# Patient Record
Sex: Male | Born: 1960 | Race: White | Hispanic: No | Marital: Married | Smoking: Former smoker
Health system: Southern US, Community
[De-identification: ages and names within clinical notes are randomized; demographics above are authoritative.]

## PROBLEM LIST (undated history)

## (undated) DIAGNOSIS — Z923 Personal history of irradiation: Secondary | ICD-10-CM

## (undated) DIAGNOSIS — C8590 Non-Hodgkin lymphoma, unspecified, unspecified site: Secondary | ICD-10-CM

## (undated) DIAGNOSIS — I10 Essential (primary) hypertension: Secondary | ICD-10-CM

## (undated) HISTORY — PX: BONE MARROW ASPIRATION: SHX1252

## (undated) HISTORY — DX: Non-Hodgkin lymphoma, unspecified, unspecified site: C85.90

## (undated) HISTORY — DX: Personal history of irradiation: Z92.3

## (undated) HISTORY — PX: TONSILLECTOMY: SUR1361

## (undated) HISTORY — PX: BIOPSY PHARYNX: SUR141

## (undated) HISTORY — DX: Essential (primary) hypertension: I10

---

## 2003-09-13 ENCOUNTER — Ambulatory Visit (HOSPITAL_COMMUNITY): Admission: RE | Admit: 2003-09-13 | Discharge: 2003-09-13 | Payer: Self-pay | Admitting: *Deleted

## 2003-09-13 ENCOUNTER — Encounter (INDEPENDENT_AMBULATORY_CARE_PROVIDER_SITE_OTHER): Payer: Self-pay | Admitting: *Deleted

## 2003-09-13 ENCOUNTER — Encounter (INDEPENDENT_AMBULATORY_CARE_PROVIDER_SITE_OTHER): Payer: Self-pay | Admitting: Specialist

## 2003-09-13 ENCOUNTER — Ambulatory Visit (HOSPITAL_BASED_OUTPATIENT_CLINIC_OR_DEPARTMENT_OTHER): Admission: RE | Admit: 2003-09-13 | Discharge: 2003-09-13 | Payer: Self-pay | Admitting: *Deleted

## 2003-09-20 ENCOUNTER — Encounter (INDEPENDENT_AMBULATORY_CARE_PROVIDER_SITE_OTHER): Payer: Self-pay | Admitting: Specialist

## 2003-09-20 ENCOUNTER — Ambulatory Visit (HOSPITAL_BASED_OUTPATIENT_CLINIC_OR_DEPARTMENT_OTHER): Admission: RE | Admit: 2003-09-20 | Discharge: 2003-09-20 | Payer: Self-pay | Admitting: *Deleted

## 2003-09-20 ENCOUNTER — Encounter (INDEPENDENT_AMBULATORY_CARE_PROVIDER_SITE_OTHER): Payer: Self-pay | Admitting: *Deleted

## 2003-09-20 ENCOUNTER — Ambulatory Visit (HOSPITAL_COMMUNITY): Admission: RE | Admit: 2003-09-20 | Discharge: 2003-09-20 | Payer: Self-pay | Admitting: *Deleted

## 2003-09-29 ENCOUNTER — Ambulatory Visit (HOSPITAL_COMMUNITY): Admission: RE | Admit: 2003-09-29 | Discharge: 2003-09-29 | Payer: Self-pay | Admitting: *Deleted

## 2003-10-12 ENCOUNTER — Ambulatory Visit (HOSPITAL_COMMUNITY): Admission: RE | Admit: 2003-10-12 | Discharge: 2003-10-12 | Payer: Self-pay | Admitting: Oncology

## 2003-10-12 ENCOUNTER — Encounter (INDEPENDENT_AMBULATORY_CARE_PROVIDER_SITE_OTHER): Payer: Self-pay | Admitting: Cardiology

## 2003-10-13 ENCOUNTER — Ambulatory Visit (HOSPITAL_COMMUNITY): Admission: RE | Admit: 2003-10-13 | Discharge: 2003-10-13 | Payer: Self-pay | Admitting: Oncology

## 2003-10-13 ENCOUNTER — Other Ambulatory Visit: Admission: RE | Admit: 2003-10-13 | Discharge: 2003-10-13 | Payer: Self-pay | Admitting: Oncology

## 2003-10-13 ENCOUNTER — Encounter (INDEPENDENT_AMBULATORY_CARE_PROVIDER_SITE_OTHER): Payer: Self-pay | Admitting: *Deleted

## 2003-10-25 ENCOUNTER — Ambulatory Visit: Admission: RE | Admit: 2003-10-25 | Discharge: 2003-12-01 | Payer: Self-pay | Admitting: Radiation Oncology

## 2003-11-24 ENCOUNTER — Ambulatory Visit: Payer: Self-pay | Admitting: Oncology

## 2004-01-10 ENCOUNTER — Ambulatory Visit: Payer: Self-pay | Admitting: Oncology

## 2004-02-25 ENCOUNTER — Ambulatory Visit: Payer: Self-pay | Admitting: Oncology

## 2004-03-31 ENCOUNTER — Encounter: Admission: RE | Admit: 2004-03-31 | Discharge: 2004-03-31 | Payer: Self-pay | Admitting: Oncology

## 2004-05-31 ENCOUNTER — Ambulatory Visit: Payer: Self-pay | Admitting: Oncology

## 2004-07-20 ENCOUNTER — Ambulatory Visit: Payer: Self-pay | Admitting: Oncology

## 2004-09-04 ENCOUNTER — Ambulatory Visit: Payer: Self-pay | Admitting: Oncology

## 2004-10-20 ENCOUNTER — Ambulatory Visit: Payer: Self-pay | Admitting: Oncology

## 2004-12-12 ENCOUNTER — Ambulatory Visit: Payer: Self-pay | Admitting: Oncology

## 2004-12-27 ENCOUNTER — Encounter: Admission: RE | Admit: 2004-12-27 | Discharge: 2004-12-27 | Payer: Self-pay | Admitting: Oncology

## 2005-02-09 ENCOUNTER — Ambulatory Visit: Payer: Self-pay | Admitting: Oncology

## 2005-05-14 ENCOUNTER — Ambulatory Visit: Payer: Self-pay | Admitting: Oncology

## 2005-05-14 LAB — CBC WITH DIFFERENTIAL/PLATELET
BASO%: 1 % (ref 0.0–2.0)
Basophils Absolute: 0 10*3/uL (ref 0.0–0.1)
EOS%: 1.7 % (ref 0.0–7.0)
Eosinophils Absolute: 0.1 10*3/uL (ref 0.0–0.5)
HCT: 44.5 % (ref 38.7–49.9)
HGB: 16.1 g/dL (ref 13.0–17.1)
LYMPH%: 30.7 % (ref 14.0–48.0)
MCH: 31.2 pg (ref 28.0–33.4)
MCHC: 36.2 g/dL — ABNORMAL HIGH (ref 32.0–35.9)
MCV: 86.1 fL (ref 81.6–98.0)
MONO#: 0.5 10*3/uL (ref 0.1–0.9)
MONO%: 12.1 % (ref 0.0–13.0)
NEUT#: 2.4 10*3/uL (ref 1.5–6.5)
NEUT%: 54.5 % (ref 40.0–75.0)
Platelets: 211 10*3/uL (ref 145–400)
RBC: 5.16 10*6/uL (ref 4.20–5.71)
RDW: 11.3 % (ref 11.2–14.6)
WBC: 4.4 10*3/uL (ref 4.0–10.0)
lymph#: 1.4 10*3/uL (ref 0.9–3.3)

## 2005-09-28 ENCOUNTER — Ambulatory Visit: Payer: Self-pay | Admitting: Oncology

## 2005-10-01 ENCOUNTER — Encounter: Admission: RE | Admit: 2005-10-01 | Discharge: 2005-10-01 | Payer: Self-pay | Admitting: Oncology

## 2005-10-01 LAB — CBC WITH DIFFERENTIAL/PLATELET
BASO%: 1.3 % (ref 0.0–2.0)
Basophils Absolute: 0.1 10*3/uL (ref 0.0–0.1)
EOS%: 1 % (ref 0.0–7.0)
Eosinophils Absolute: 0.1 10*3/uL (ref 0.0–0.5)
HCT: 44.3 % (ref 38.7–49.9)
HGB: 16.1 g/dL (ref 13.0–17.1)
LYMPH%: 26.2 % (ref 14.0–48.0)
MCH: 30.7 pg (ref 28.0–33.4)
MCHC: 36.4 g/dL — ABNORMAL HIGH (ref 32.0–35.9)
MCV: 84.4 fL (ref 81.6–98.0)
MONO#: 0.5 10*3/uL (ref 0.1–0.9)
MONO%: 9.9 % (ref 0.0–13.0)
NEUT#: 3.4 10*3/uL (ref 1.5–6.5)
NEUT%: 61.6 % (ref 40.0–75.0)
Platelets: 194 10*3/uL (ref 145–400)
RBC: 5.25 10*6/uL (ref 4.20–5.71)
RDW: 11 % — ABNORMAL LOW (ref 11.2–14.6)
WBC: 5.5 10*3/uL (ref 4.0–10.0)
lymph#: 1.4 10*3/uL (ref 0.9–3.3)

## 2005-10-05 LAB — COMPREHENSIVE METABOLIC PANEL WITH GFR
ALT: 30 U/L (ref 0–40)
AST: 18 U/L (ref 0–37)
Albumin: 5.1 g/dL (ref 3.5–5.2)
Alkaline Phosphatase: 24 U/L — ABNORMAL LOW (ref 39–117)
BUN: 18 mg/dL (ref 6–23)
CO2: 27 meq/L (ref 19–32)
Calcium: 10 mg/dL (ref 8.4–10.5)
Chloride: 104 meq/L (ref 96–112)
Creatinine, Ser: 0.98 mg/dL (ref 0.40–1.50)
Glucose, Bld: 88 mg/dL (ref 70–99)
Potassium: 4.7 meq/L (ref 3.5–5.3)
Sodium: 139 meq/L (ref 135–145)
Total Bilirubin: 1.2 mg/dL (ref 0.3–1.2)
Total Protein: 7.6 g/dL (ref 6.0–8.3)

## 2005-10-05 LAB — LACTATE DEHYDROGENASE: LDH: 132 U/L (ref 94–250)

## 2006-01-16 ENCOUNTER — Ambulatory Visit: Payer: Self-pay | Admitting: Oncology

## 2006-04-02 ENCOUNTER — Ambulatory Visit: Payer: Self-pay | Admitting: Oncology

## 2006-04-05 LAB — CBC WITH DIFFERENTIAL/PLATELET
BASO%: 0.4 % (ref 0.0–2.0)
Basophils Absolute: 0 10*3/uL (ref 0.0–0.1)
EOS%: 1.2 % (ref 0.0–7.0)
Eosinophils Absolute: 0.1 10*3/uL (ref 0.0–0.5)
HCT: 46.4 % (ref 38.7–49.9)
HGB: 16.5 g/dL (ref 13.0–17.1)
LYMPH%: 25.8 % (ref 14.0–48.0)
MCH: 30.9 pg (ref 28.0–33.4)
MCHC: 35.7 g/dL (ref 32.0–35.9)
MCV: 86.6 fL (ref 81.6–98.0)
MONO#: 0.4 10*3/uL (ref 0.1–0.9)
MONO%: 7.8 % (ref 0.0–13.0)
NEUT#: 3.4 10*3/uL (ref 1.5–6.5)
NEUT%: 64.8 % (ref 40.0–75.0)
Platelets: 256 10*3/uL (ref 145–400)
RBC: 5.35 10*6/uL (ref 4.20–5.71)
RDW: 13 % (ref 11.2–14.6)
WBC: 5.3 10*3/uL (ref 4.0–10.0)
lymph#: 1.4 10*3/uL (ref 0.9–3.3)

## 2006-10-01 ENCOUNTER — Ambulatory Visit: Payer: Self-pay | Admitting: Oncology

## 2006-10-03 LAB — CBC WITH DIFFERENTIAL/PLATELET
Basophils Absolute: 0.1 10*3/uL (ref 0.0–0.1)
Eosinophils Absolute: 0 10*3/uL (ref 0.0–0.5)
HCT: 43.6 % (ref 38.7–49.9)
HGB: 15.4 g/dL (ref 13.0–17.1)
MCH: 30 pg (ref 28.0–33.4)
NEUT#: 2.8 10*3/uL (ref 1.5–6.5)
NEUT%: 58 % (ref 40.0–75.0)
RDW: 10.5 % — ABNORMAL LOW (ref 11.2–14.6)
lymph#: 1.4 10*3/uL (ref 0.9–3.3)

## 2007-04-01 ENCOUNTER — Ambulatory Visit: Payer: Self-pay | Admitting: Oncology

## 2007-04-03 LAB — CBC WITH DIFFERENTIAL/PLATELET
BASO%: 1 % (ref 0.0–2.0)
Basophils Absolute: 0.1 10*3/uL (ref 0.0–0.1)
EOS%: 0.6 % (ref 0.0–7.0)
Eosinophils Absolute: 0 10*3/uL (ref 0.0–0.5)
HCT: 43.8 % (ref 38.7–49.9)
HGB: 15.4 g/dL (ref 13.0–17.1)
LYMPH%: 31.5 % (ref 14.0–48.0)
MCH: 30.1 pg (ref 28.0–33.4)
MCHC: 35.1 g/dL (ref 32.0–35.9)
MCV: 85.8 fL (ref 81.6–98.0)
MONO#: 0.5 10*3/uL (ref 0.1–0.9)
MONO%: 9.2 % (ref 0.0–13.0)
NEUT#: 3.4 10*3/uL (ref 1.5–6.5)
NEUT%: 57.7 % (ref 40.0–75.0)
Platelets: 178 10*3/uL (ref 145–400)
RBC: 5.11 10*6/uL (ref 4.20–5.71)
RDW: 11.7 % (ref 11.2–14.6)
WBC: 5.8 10*3/uL (ref 4.0–10.0)
lymph#: 1.8 10*3/uL (ref 0.9–3.3)

## 2008-02-12 ENCOUNTER — Ambulatory Visit: Payer: Self-pay | Admitting: Oncology

## 2009-02-22 ENCOUNTER — Ambulatory Visit: Payer: Self-pay | Admitting: Oncology

## 2009-02-24 LAB — CBC WITH DIFFERENTIAL/PLATELET
BASO%: 0.8 % (ref 0.0–2.0)
Basophils Absolute: 0 10*3/uL (ref 0.0–0.1)
EOS%: 1.6 % (ref 0.0–7.0)
Eosinophils Absolute: 0.1 10*3/uL (ref 0.0–0.5)
HCT: 46.9 % (ref 38.4–49.9)
HGB: 16.5 g/dL (ref 13.0–17.1)
LYMPH%: 30.6 % (ref 14.0–49.0)
MCH: 30.7 pg (ref 27.2–33.4)
MCHC: 35.2 g/dL (ref 32.0–36.0)
MCV: 87.2 fL (ref 79.3–98.0)
MONO#: 0.5 10*3/uL (ref 0.1–0.9)
MONO%: 10.4 % (ref 0.0–14.0)
NEUT#: 2.8 10*3/uL (ref 1.5–6.5)
NEUT%: 56.6 % (ref 39.0–75.0)
Platelets: 173 10*3/uL (ref 140–400)
RBC: 5.38 10*6/uL (ref 4.20–5.82)
RDW: 12.8 % (ref 11.0–14.6)
WBC: 5 10*3/uL (ref 4.0–10.3)
lymph#: 1.5 10*3/uL (ref 0.9–3.3)

## 2010-06-09 NOTE — Op Note (Signed)
[name], [name]                               ACCOUNT NO.:  [id number]   MEDICAL RECORD NO.:  [id number]                   PATIENT TYPE:  AMB   LOCATION:  DSC                                  FACILITY:  [facility]   PHYSICIAN:  Kathy Breach, M.D.                   DATE OF BIRTH:  [birth year]   DATE OF PROCEDURE:  09/20/2003  DATE OF DISCHARGE:                                 OPERATIVE REPORT   PREOPERATIVE DIAGNOSIS:  Necrosing lesion, left tonsil, rule out lymphoma,  suggested but not totally confirmed on previous biopsy.   POSTOPERATIVE DIAGNOSIS:  Pending histological report.   OPERATION PERFORMED:  Tonsillectomy.   SURGEON:  Kathy Breach, M.D.   ANESTHESIA:  General orotracheal.   DESCRIPTION OF PROCEDURE:  With the patient under general orotracheal  anesthesia, a Crowe-Davis mouth gag was inserted and the patient put in [place]  position.  Inspection of the oral cavity revealed a necrotic erosive lesion  involving the left tonsil tissue which was enlarged but appeared to have  sharp delineated margins.  Tonsil was nonpulsatile to palpation.  The right  tonsil was 1+ enlarged and not smooth, shiny, healthy-appearing tonsil.  There was an additional Michaelfurt of lymphoid tissue on the posterior  pharyngeal wall to the right of the midline at the level of the right tonsil  with a yellow necrotic 3 or 4 mm central portion.  The left tonsil expected  to be very friable, so handled gently without initial use of tonsillar  tenaculum for retraction. With cutting current, mucosal margins were incised  on the anterior tonsillar pillar and around the superior pole.  With gradual  dissection bluntly at the not very well defined junction between tonsillar  tissue and the constrictor and pharyngeal muscles, slowly and gradually  maintained complete hemostasis with electrocautery, the left tonsil was  dissected and removed.  The right tonsil then grasped at the superior pole  and removed by electrical  dissection, maintaining complete hemostasis with  electrocautery.  The prominent centrally necrosing island of lymphoid tissue  on the posterior pharyngeal wall likewise was excised with electrocautery.  The estimated blood loss for this procedure was less than 15 mL.  The  patient tolerated the procedure well and was taken to recovery room in  stable general condition.  Histologic specimens were submitted fresh in  separate containers for identification and further evaluation.                                               Kathy Breach, M.D.    Venia Minks  D:  09/20/2003  T:  09/20/2003  Job:  [id number]

## 2010-08-09 ENCOUNTER — Other Ambulatory Visit: Payer: Self-pay | Admitting: Internal Medicine

## 2010-08-10 ENCOUNTER — Ambulatory Visit
Admission: RE | Admit: 2010-08-10 | Discharge: 2010-08-10 | Disposition: A | Discharge status: Home / Self Care | Payer: 59 | Source: Ambulatory Visit | Attending: Internal Medicine | Admitting: Internal Medicine

## 2010-08-10 MED ORDER — IOHEXOL 300 MG/ML  SOLN
75.0000 mL | Freq: Once | INTRAMUSCULAR | Status: AC | PRN
  Administered 2010-08-10: 75 mL via INTRAVENOUS

## 2010-08-17 ENCOUNTER — Other Ambulatory Visit: Payer: Self-pay | Admitting: Oncology

## 2010-08-17 DIAGNOSIS — C8590 Non-Hodgkin lymphoma, unspecified, unspecified site: Secondary | ICD-10-CM

## 2010-08-22 ENCOUNTER — Encounter (HOSPITAL_BASED_OUTPATIENT_CLINIC_OR_DEPARTMENT_OTHER): Payer: 59 | Admitting: Oncology

## 2010-08-22 DIAGNOSIS — C8589 Other specified types of non-Hodgkin lymphoma, extranodal and solid organ sites: Secondary | ICD-10-CM

## 2010-08-23 ENCOUNTER — Encounter (HOSPITAL_COMMUNITY)
Admission: RE | Admit: 2010-08-23 | Discharge: 2010-08-23 | Disposition: A | Discharge status: Home / Self Care | Payer: 59 | Source: Ambulatory Visit | Attending: Oncology | Admitting: Oncology

## 2010-08-23 DIAGNOSIS — C8590 Non-Hodgkin lymphoma, unspecified, unspecified site: Secondary | ICD-10-CM

## 2010-08-23 DIAGNOSIS — C8589 Other specified types of non-Hodgkin lymphoma, extranodal and solid organ sites: Secondary | ICD-10-CM | POA: Insufficient documentation

## 2010-08-23 LAB — GLUCOSE, CAPILLARY: Glucose-Capillary: 94 mg/dL (ref 70–99)

## 2010-08-23 MED ORDER — FLUDEOXYGLUCOSE F - 18 (FDG) INJECTION
18.6000 | Freq: Once | INTRAVENOUS | Status: AC | PRN
  Administered 2010-08-23: 18.6 via INTRAVENOUS

## 2010-09-06 ENCOUNTER — Encounter (HOSPITAL_BASED_OUTPATIENT_CLINIC_OR_DEPARTMENT_OTHER): Payer: 59 | Admitting: Oncology

## 2010-09-06 DIAGNOSIS — C8589 Other specified types of non-Hodgkin lymphoma, extranodal and solid organ sites: Secondary | ICD-10-CM

## 2010-09-08 ENCOUNTER — Other Ambulatory Visit (HOSPITAL_COMMUNITY)
Admission: RE | Admit: 2010-09-08 | Discharge: 2010-09-08 | Disposition: A | Discharge status: Home / Self Care | Payer: 59 | Source: Ambulatory Visit | Attending: Oncology | Admitting: Oncology

## 2010-09-08 ENCOUNTER — Other Ambulatory Visit: Payer: Self-pay | Admitting: Oncology

## 2010-09-08 ENCOUNTER — Encounter (HOSPITAL_BASED_OUTPATIENT_CLINIC_OR_DEPARTMENT_OTHER): Payer: 59 | Admitting: Oncology

## 2010-09-08 DIAGNOSIS — C8589 Other specified types of non-Hodgkin lymphoma, extranodal and solid organ sites: Secondary | ICD-10-CM | POA: Insufficient documentation

## 2010-09-08 DIAGNOSIS — D469 Myelodysplastic syndrome, unspecified: Secondary | ICD-10-CM | POA: Insufficient documentation

## 2010-09-12 ENCOUNTER — Ambulatory Visit
Admission: RE | Admit: 2010-09-12 | Discharge: 2010-09-12 | Disposition: A | Discharge status: Home / Self Care | Payer: Managed Care, Other (non HMO) | Source: Ambulatory Visit | Attending: Radiation Oncology | Admitting: Radiation Oncology

## 2010-09-12 DIAGNOSIS — C8581 Other specified types of non-Hodgkin lymphoma, lymph nodes of head, face, and neck: Secondary | ICD-10-CM | POA: Insufficient documentation

## 2010-09-18 ENCOUNTER — Encounter (HOSPITAL_BASED_OUTPATIENT_CLINIC_OR_DEPARTMENT_OTHER): Payer: 59 | Admitting: Oncology

## 2010-09-18 ENCOUNTER — Other Ambulatory Visit (HOSPITAL_COMMUNITY): Payer: 59 | Admitting: Dentistry

## 2010-09-18 DIAGNOSIS — K0530 Chronic periodontitis, unspecified: Secondary | ICD-10-CM

## 2010-09-18 DIAGNOSIS — K036 Deposits [accretions] on teeth: Secondary | ICD-10-CM

## 2010-09-18 DIAGNOSIS — M264 Malocclusion, unspecified: Secondary | ICD-10-CM

## 2010-09-19 ENCOUNTER — Inpatient Hospital Stay (HOSPITAL_COMMUNITY): Payer: Managed Care, Other (non HMO)

## 2010-09-19 ENCOUNTER — Inpatient Hospital Stay (HOSPITAL_COMMUNITY)
Admission: AD | Admit: 2010-09-19 | Discharge: 2010-09-21 | DRG: 847 | Disposition: A | Discharge status: Home / Self Care | Payer: Managed Care, Other (non HMO) | Source: Ambulatory Visit | Attending: Oncology | Admitting: Oncology

## 2010-09-19 DIAGNOSIS — C8589 Other specified types of non-Hodgkin lymphoma, extranodal and solid organ sites: Secondary | ICD-10-CM | POA: Diagnosis present

## 2010-09-19 DIAGNOSIS — Z5111 Encounter for antineoplastic chemotherapy: Principal | ICD-10-CM

## 2010-09-19 DIAGNOSIS — I10 Essential (primary) hypertension: Secondary | ICD-10-CM | POA: Diagnosis present

## 2010-09-19 DIAGNOSIS — L539 Erythematous condition, unspecified: Secondary | ICD-10-CM | POA: Diagnosis not present

## 2010-09-19 HISTORY — PX: PORTACATH PLACEMENT: SHX2246

## 2010-09-19 LAB — COMPREHENSIVE METABOLIC PANEL
Alkaline Phosphatase: 28 U/L — ABNORMAL LOW (ref 39–117)
BUN: 16 mg/dL (ref 6–23)
Creatinine, Ser: 0.82 mg/dL (ref 0.50–1.35)
GFR calc Af Amer: >60 mL/min (ref >60)
Glucose, Bld: 98 mg/dL (ref 70–99)
Potassium: 3.9 mEq/L (ref 3.5–5.1)
Total Protein: 6.9 g/dL (ref 6.0–8.3)

## 2010-09-19 LAB — DIFFERENTIAL
Basophils Absolute: 0 10*3/uL (ref 0.0–0.1)
Eosinophils Relative: 1 % (ref 0–5)
Lymphocytes Relative: 22 % (ref 12–46)
Lymphs Abs: 1.1 10*3/uL (ref 0.7–4.0)
Monocytes Absolute: 0.6 10*3/uL (ref 0.1–1.0)
Monocytes Relative: 12 % (ref 3–12)
Neutro Abs: 3.4 10*3/uL (ref 1.7–7.7)

## 2010-09-19 LAB — CBC
HCT: 40.7 % (ref 39.0–52.0)
Hemoglobin: 14.6 g/dL (ref 13.0–17.0)
MCH: 30.4 pg (ref 26.0–34.0)
MCHC: 35.9 g/dL (ref 30.0–36.0)
MCV: 84.6 fL (ref 78.0–100.0)
RDW: 12.6 % (ref 11.5–15.5)

## 2010-09-19 LAB — LACTATE DEHYDROGENASE: LDH: 179 U/L (ref 94–250)

## 2010-09-19 LAB — HEPATITIS B SURFACE ANTIGEN: Hepatitis B Surface Ag: NEGATIVE

## 2010-09-19 LAB — URIC ACID: Uric Acid, Serum: 7.4 mg/dL (ref 4.0–7.8)

## 2010-09-20 LAB — URINALYSIS, ROUTINE W REFLEX MICROSCOPIC
Bilirubin Urine: NEGATIVE
Leukocytes, UA: NEGATIVE
Nitrite: NEGATIVE
Specific Gravity, Urine: 1.013 (ref 1.005–1.030)
Urobilinogen, UA: 0.2 mg/dL (ref 0.0–1.0)
pH: 5.5 (ref 5.0–8.0)

## 2010-09-20 LAB — URINALYSIS, DIPSTICK ONLY
Leukocytes, UA: NEGATIVE
Nitrite: NEGATIVE
Specific Gravity, Urine: 1.012 (ref 1.005–1.030)
Urobilinogen, UA: 0.2 mg/dL (ref 0.0–1.0)
pH: 5.5 (ref 5.0–8.0)

## 2010-09-20 LAB — URINE MICROSCOPIC-ADD ON: Urine-Other: NONE SEEN

## 2010-09-21 LAB — URINALYSIS, DIPSTICK ONLY
Glucose, UA: NEGATIVE mg/dL
Leukocytes, UA: NEGATIVE
Nitrite: NEGATIVE
pH: 6 (ref 5.0–8.0)

## 2010-09-22 ENCOUNTER — Encounter (HOSPITAL_BASED_OUTPATIENT_CLINIC_OR_DEPARTMENT_OTHER): Payer: Managed Care, Other (non HMO) | Admitting: Oncology

## 2010-09-22 DIAGNOSIS — C8589 Other specified types of non-Hodgkin lymphoma, extranodal and solid organ sites: Secondary | ICD-10-CM

## 2010-09-28 ENCOUNTER — Encounter (HOSPITAL_BASED_OUTPATIENT_CLINIC_OR_DEPARTMENT_OTHER): Payer: Managed Care, Other (non HMO) | Admitting: Oncology

## 2010-09-28 ENCOUNTER — Other Ambulatory Visit: Payer: Self-pay | Admitting: Oncology

## 2010-09-28 DIAGNOSIS — C8589 Other specified types of non-Hodgkin lymphoma, extranodal and solid organ sites: Secondary | ICD-10-CM

## 2010-09-28 LAB — CBC WITH DIFFERENTIAL/PLATELET
BASO%: 2.8 % — ABNORMAL HIGH (ref 0.0–2.0)
Basophils Absolute: 0 10*3/uL (ref 0.0–0.1)
EOS%: 2.8 % (ref 0.0–7.0)
HCT: 34.7 % — ABNORMAL LOW (ref 38.4–49.9)
HGB: 12.5 g/dL — ABNORMAL LOW (ref 13.0–17.1)
MCHC: 36 g/dL (ref 32.0–36.0)
MONO#: 0.5 10*3/uL (ref 0.1–0.9)
NEUT%: 4.8 % — ABNORMAL LOW (ref 39.0–75.0)
RDW: 12 % (ref 11.0–14.6)
WBC: 1.4 10*3/uL — ABNORMAL LOW (ref 4.0–10.3)
lymph#: 0.8 10*3/uL — ABNORMAL LOW (ref 0.9–3.3)

## 2010-09-29 ENCOUNTER — Other Ambulatory Visit: Payer: Self-pay | Admitting: Oncology

## 2010-09-29 ENCOUNTER — Encounter (HOSPITAL_BASED_OUTPATIENT_CLINIC_OR_DEPARTMENT_OTHER): Payer: Managed Care, Other (non HMO) | Admitting: Oncology

## 2010-09-29 DIAGNOSIS — C8589 Other specified types of non-Hodgkin lymphoma, extranodal and solid organ sites: Secondary | ICD-10-CM

## 2010-09-29 LAB — CBC WITH DIFFERENTIAL/PLATELET
Basophils Absolute: 0.2 10*3/uL — ABNORMAL HIGH (ref 0.0–0.1)
Eosinophils Absolute: 0.1 10*3/uL (ref 0.0–0.5)
HGB: 12.5 g/dL — ABNORMAL LOW (ref 13.0–17.1)
MCV: 82.5 fL (ref 79.3–98.0)
MONO#: 2.4 10*3/uL — ABNORMAL HIGH (ref 0.1–0.9)
MONO%: 36.3 % — ABNORMAL HIGH (ref 0.0–14.0)
NEUT#: 2.6 10*3/uL (ref 1.5–6.5)
RBC: 4.17 10*6/uL — ABNORMAL LOW (ref 4.20–5.82)
RDW: 12 % (ref 11.0–14.6)
WBC: 6.7 10*3/uL (ref 4.0–10.3)

## 2010-10-02 ENCOUNTER — Encounter (HOSPITAL_BASED_OUTPATIENT_CLINIC_OR_DEPARTMENT_OTHER): Payer: Managed Care, Other (non HMO) | Admitting: Oncology

## 2010-10-02 ENCOUNTER — Other Ambulatory Visit: Payer: Self-pay | Admitting: Oncology

## 2010-10-02 DIAGNOSIS — C8589 Other specified types of non-Hodgkin lymphoma, extranodal and solid organ sites: Secondary | ICD-10-CM

## 2010-10-02 LAB — CBC WITH DIFFERENTIAL/PLATELET
Basophils Absolute: 0.2 10*3/uL — ABNORMAL HIGH (ref 0.0–0.1)
Eosinophils Absolute: 0 10*3/uL (ref 0.0–0.5)
HCT: 34.3 % — ABNORMAL LOW (ref 38.4–49.9)
HGB: 12.1 g/dL — ABNORMAL LOW (ref 13.0–17.1)
LYMPH%: 11.6 % — ABNORMAL LOW (ref 14.0–49.0)
MCV: 83.9 fL (ref 79.3–98.0)
MONO#: 0.4 10*3/uL (ref 0.1–0.9)
MONO%: 2.4 % (ref 0.0–14.0)
NEUT#: 15.8 10*3/uL — ABNORMAL HIGH (ref 1.5–6.5)
NEUT%: 84.9 % — ABNORMAL HIGH (ref 39.0–75.0)
Platelets: 35 10*3/uL — ABNORMAL LOW (ref 140–400)
WBC: 18.6 10*3/uL — ABNORMAL HIGH (ref 4.0–10.3)
nRBC: 0 % (ref 0–0)

## 2010-10-04 ENCOUNTER — Encounter (HOSPITAL_BASED_OUTPATIENT_CLINIC_OR_DEPARTMENT_OTHER): Payer: Managed Care, Other (non HMO) | Admitting: Oncology

## 2010-10-04 ENCOUNTER — Other Ambulatory Visit: Payer: Self-pay | Admitting: Oncology

## 2010-10-04 DIAGNOSIS — C8589 Other specified types of non-Hodgkin lymphoma, extranodal and solid organ sites: Secondary | ICD-10-CM

## 2010-10-04 LAB — CBC WITH DIFFERENTIAL/PLATELET
Basophils Absolute: 0 10*3/uL (ref 0.0–0.1)
EOS%: 0.2 % (ref 0.0–7.0)
HCT: 36.1 % — ABNORMAL LOW (ref 38.4–49.9)
HGB: 12.9 g/dL — ABNORMAL LOW (ref 13.0–17.1)
LYMPH%: 6.6 % — ABNORMAL LOW (ref 14.0–49.0)
MCH: 30.9 pg (ref 27.2–33.4)
MCHC: 35.8 g/dL (ref 32.0–36.0)
NEUT%: 87.7 % — ABNORMAL HIGH (ref 39.0–75.0)
Platelets: 60 10*3/uL — ABNORMAL LOW (ref 140–400)
lymph#: 0.9 10*3/uL (ref 0.9–3.3)

## 2010-10-09 ENCOUNTER — Encounter (HOSPITAL_BASED_OUTPATIENT_CLINIC_OR_DEPARTMENT_OTHER): Payer: Managed Care, Other (non HMO) | Admitting: Oncology

## 2010-10-09 ENCOUNTER — Other Ambulatory Visit: Payer: Self-pay | Admitting: Oncology

## 2010-10-09 DIAGNOSIS — C8589 Other specified types of non-Hodgkin lymphoma, extranodal and solid organ sites: Secondary | ICD-10-CM

## 2010-10-09 LAB — CBC WITH DIFFERENTIAL/PLATELET
BASO%: 0.5 % (ref 0.0–2.0)
Basophils Absolute: 0 10*3/uL (ref 0.0–0.1)
EOS%: 0.6 % (ref 0.0–7.0)
HCT: 32.8 % — ABNORMAL LOW (ref 38.4–49.9)
HGB: 11.9 g/dL — ABNORMAL LOW (ref 13.0–17.1)
MCH: 29.9 pg (ref 27.2–33.4)
MCHC: 36.3 g/dL — ABNORMAL HIGH (ref 32.0–36.0)
MCV: 82.4 fL (ref 79.3–98.0)
MONO%: 8.8 % (ref 0.0–14.0)
NEUT%: 78 % — ABNORMAL HIGH (ref 39.0–75.0)
RDW: 12.3 % (ref 11.0–14.6)

## 2010-10-10 ENCOUNTER — Inpatient Hospital Stay (HOSPITAL_COMMUNITY)
Admission: RE | Admit: 2010-10-10 | Discharge: 2010-10-12 | DRG: 847 | Disposition: A | Discharge status: Home / Self Care | Payer: Managed Care, Other (non HMO) | Source: Ambulatory Visit | Attending: Oncology | Admitting: Oncology

## 2010-10-10 DIAGNOSIS — C8589 Other specified types of non-Hodgkin lymphoma, extranodal and solid organ sites: Secondary | ICD-10-CM

## 2010-10-10 DIAGNOSIS — I10 Essential (primary) hypertension: Secondary | ICD-10-CM | POA: Diagnosis present

## 2010-10-10 DIAGNOSIS — Z5111 Encounter for antineoplastic chemotherapy: Secondary | ICD-10-CM

## 2010-10-10 LAB — COMPREHENSIVE METABOLIC PANEL
Albumin: 3.8 g/dL (ref 3.5–5.2)
BUN: 12 mg/dL (ref 6–23)
Creatinine, Ser: 0.71 mg/dL (ref 0.50–1.35)
Total Protein: 6.5 g/dL (ref 6.0–8.3)

## 2010-10-10 LAB — URINALYSIS, DIPSTICK ONLY
Bilirubin Urine: NEGATIVE
Bilirubin Urine: NEGATIVE
Glucose, UA: NEGATIVE mg/dL
Glucose, UA: NEGATIVE mg/dL
Hgb urine dipstick: NEGATIVE
Hgb urine dipstick: NEGATIVE
Ketones, ur: >80 mg/dL — AB
Leukocytes, UA: NEGATIVE
Specific Gravity, Urine: 1.017 (ref 1.005–1.030)
pH: 5.5 (ref 5.0–8.0)
pH: 5 (ref 5.0–8.0)

## 2010-10-11 LAB — URINALYSIS, DIPSTICK ONLY
Bilirubin Urine: NEGATIVE
Bilirubin Urine: NEGATIVE
Glucose, UA: 250 mg/dL — AB
Glucose, UA: NEGATIVE mg/dL
Hgb urine dipstick: NEGATIVE
Hgb urine dipstick: NEGATIVE
Ketones, ur: >80 mg/dL — AB
Ketones, ur: 15 mg/dL — AB
Nitrite: NEGATIVE
Protein, ur: NEGATIVE mg/dL
Specific Gravity, Urine: 1.011 (ref 1.005–1.030)
Urobilinogen, UA: 0.2 mg/dL (ref 0.0–1.0)
pH: 5.5 (ref 5.0–8.0)

## 2010-10-12 LAB — URINALYSIS, DIPSTICK ONLY
Bilirubin Urine: NEGATIVE
Glucose, UA: NEGATIVE mg/dL
Hgb urine dipstick: NEGATIVE
Ketones, ur: NEGATIVE mg/dL
Protein, ur: NEGATIVE mg/dL

## 2010-10-13 ENCOUNTER — Encounter (HOSPITAL_BASED_OUTPATIENT_CLINIC_OR_DEPARTMENT_OTHER): Payer: Managed Care, Other (non HMO) | Admitting: Oncology

## 2010-10-13 DIAGNOSIS — C8589 Other specified types of non-Hodgkin lymphoma, extranodal and solid organ sites: Secondary | ICD-10-CM

## 2010-10-19 ENCOUNTER — Encounter (HOSPITAL_BASED_OUTPATIENT_CLINIC_OR_DEPARTMENT_OTHER): Payer: Managed Care, Other (non HMO) | Admitting: Oncology

## 2010-10-19 ENCOUNTER — Other Ambulatory Visit: Payer: Self-pay | Admitting: Oncology

## 2010-10-19 DIAGNOSIS — C8589 Other specified types of non-Hodgkin lymphoma, extranodal and solid organ sites: Secondary | ICD-10-CM

## 2010-10-19 LAB — CBC WITH DIFFERENTIAL/PLATELET
BASO%: 3.8 % — ABNORMAL HIGH (ref 0.0–2.0)
EOS%: 0.8 % (ref 0.0–7.0)
HCT: 25.8 % — ABNORMAL LOW (ref 38.4–49.9)
LYMPH%: 54.5 % — ABNORMAL HIGH (ref 14.0–49.0)
MCH: 29.5 pg (ref 27.2–33.4)
MCHC: 35.7 g/dL (ref 32.0–36.0)
MCV: 82.7 fL (ref 79.3–98.0)
NEUT%: 14.4 % — ABNORMAL LOW (ref 39.0–75.0)
Platelets: 109 10*3/uL — ABNORMAL LOW (ref 140–400)

## 2010-10-23 ENCOUNTER — Other Ambulatory Visit: Payer: Self-pay | Admitting: Oncology

## 2010-10-23 ENCOUNTER — Encounter: Payer: Managed Care, Other (non HMO) | Admitting: Oncology

## 2010-10-23 LAB — CBC WITH DIFFERENTIAL/PLATELET
BASO%: 0 % (ref 0.0–2.0)
EOS%: 0.1 % (ref 0.0–7.0)
LYMPH%: 4.2 % — ABNORMAL LOW (ref 14.0–49.0)
MCHC: 36.8 g/dL — ABNORMAL HIGH (ref 32.0–36.0)
MONO#: 0.3 10*3/uL (ref 0.1–0.9)
MONO%: 0.7 % (ref 0.0–14.0)
Platelets: 26 10*3/uL — ABNORMAL LOW (ref 140–400)
RBC: 2.94 10*6/uL — ABNORMAL LOW (ref 4.20–5.82)
WBC: 42.8 10*3/uL — ABNORMAL HIGH (ref 4.0–10.3)

## 2010-10-23 LAB — URINALYSIS, MICROSCOPIC - CHCC
Leukocyte Esterase: NEGATIVE
Protein: NEGATIVE mg/dL
RBC count: NEGATIVE (ref 0–2)
pH: 7.5 (ref 4.6–8.0)

## 2010-10-23 NOTE — Discharge Summary (Signed)
[name], [name]                   ACCOUNT NO.:  [id number]  MEDICAL RECORD NO.:  [id number]  LOCATION:  1311                         FACILITY:  [facility]  PHYSICIAN:  Ladene Artist, M.D.  DATE OF BIRTH:  Apr 22, 1960  DATE OF ADMISSION:  10/10/2010 DATE OF DISCHARGE:  10/12/2010                              DISCHARGE SUMMARY   DISCHARGE DIAGNOSES: 1. Recurrent non-Hodgkin lymphoma, status post cycle 2 of RICE     chemotherapy, beginning October 10, 2010. 2. Status post Port-A-Cath placement, September 19, 2010. 3. 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 4. History of neutropenia secondary to Rituxan. 5. History of hypertension.  CONSULTATIONS:  None.  PROCEDURES:  Infusional chemotherapy.  HISTORY OF PRESENT ILLNESS:  [name] is a 50 year old man with a history of a large B-cell lymphoma, involving the left tonsil and right posterior pharynx, dating to September 2005.  He completed 6 cycles of CHOP chemotherapy and Rituxan.  In July of this year, he was found to have recurrent large B-cell lymphoma, involving a left pharynx mass. 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  Staging PET scan on August 23, 2010, showed increased FDG activity at the left tonsillar fossa and no additional evidence of lymphoma.  Staging bone marrow biopsy was negative for involvement with lymphoma.  He completed cycle 1 RICE beginning September 19, 2010.  He received Neulasta support.  [name] was admitted to the oncology unit at [facility] on October 10, 2010, to proceed with cycle 2 RICE.  HOSPITAL COURSE:  Admission labs showed hemoglobin 11.9, white count 8.5, absolute neutrophil count 6.7, platelet count 334,000.  Sodium 136, potassium 4.2, BUN 12, creatinine 0.17.  Bilirubin 0.2, alkaline phosphatase 39, SGOT 29, SGPT 40, total  protein 6.5, albumin 3.8, and calcium 9.2.  Chemotherapy doses were based on a height of 68 inches and weight 188 pounds with a BSA of 2.02.  The chemotherapy was given as follows. Carboplatin (AUC 5) was given at a dose of 550 mg on day #1 only. Etoposide 100 mg/sq m total dose 200 mg given by intravenous fusion daily for 3 days, beginning on day #1.  Ifosfamide (5000 mg/sq m), total dose 10,000 mg was given IV over 24 hours day #1 after completion of etoposide and carboplatin.  Mesna (5000 mg/sq m), total dose 10,000 mg IV over 24 hours day #1 mixed with the ifosfamide after completion of etoposide and carboplatin.  On day #3, he received Rituxan (375 mg/sq m), total dose 750 mg IV per protocol.  Overall, [name] tolerated the chemotherapy without significant acute toxicity.  He did experience mild nausea on day #2.  [name] will be discharged home following completion of the Rituxan infusion and day 3 Etoposide (October 12, 2010).  DISCHARGE MEDICATIONS: 1. Compazine 10 mg every 6 hours as needed for nausea. 2. Aleve every 12 hours as needed. 3. EMLA cream apply to Port-A-Cath site as directed.  DISCHARGE DISPOSITION: 1. Condition - stable for discharge home. 2.  Activity as tolerated. 3. Diet as tolerated. 4. Wound care, routine care of Port-A-Cath.  FOLLOWUP: 1. Neulasta injection at the cancer center on October 13, 2010. 2. Appointment with Dr. Truett Perna as scheduled     Arnaldo Natal, NP   ______________________________ Ladene Artist, M.D.    LCT/MEDQ  D:  10/12/2010  T:  10/12/2010  Job:  [id number]  Electronically Signed by Lonna Cobb N.P. on 10/16/2010 04:59:20 PM Electronically Signed by Thornton Papas M.D. on 10/23/2010 08:40:17 AM

## 2010-10-23 NOTE — H&P (Signed)
NAMELLEWELYN, SHEAFFER NO.:  [id number]  MEDICAL RECORD NO.:  [id number]  LOCATION:  1335                         FACILITY:  [facility]  PHYSICIAN:  Ladene Artist, M.D.  DATE OF BIRTH:  Nov 09, 1960  DATE OF ADMISSION:  09/19/2010 DATE OF DISCHARGE:                             HISTORY & PHYSICAL   REASON FOR ADMISSION:  Non-Hodgkin lymphoma for chemotherapy.  HISTORY OF PRESENT ILLNESS:  Mr. Maniscalco is a 50 year old man with a history of large B-cell lymphoma, involving the left tonsil and right posterior pharynx dating to September 2005.  He completed 6 cycles of CHOP chemotherapy and Rituxan.  He was found to have recurrent large B-cell lymphoma, involving a left pharynx mass in July of this year.  Biopsy confirmed diffuse large B- cell lymphoma, CD20 positive.  Staging CAT scan on August 23, 2010, showed increased FDG activity at the left tonsillar fossa and no additional evidence of lymphoma.  Staging bone marrow biopsy was negative for involvement with lymphoma.  Mr. Kuck presents today for elective admission to proceed with cycle 1 of RICE.  He has no complaints.  Sore throat is better.  He denies dysphagia.  No fever or sweats.  He has a good appetite.  He denies unusual headaches.  No visual disturbance.  He denies shortness of breath or cough.  He has had no chest pain.  He denies leg swelling or calf pain.  He denies nausea or vomiting.  Bowels are moving regularly. No hematuria or dysuria.  PAST MEDICAL HISTORY:  Non-Hodgkin lymphoma, as above.  PAST SURGICAL HISTORY:  Status post bilateral tonsillectomy procedure and excision of right posterior pharynx mass on September 20, 2003.  MEDICATIONS:  Ibuprofen as needed.  ALLERGIES:  PENICILLIN.  FAMILY HISTORY:  Noncontributory.  There is no family history of cancer.  SOCIAL HISTORY:  Mr. Sherod lives in [place].  He is married.  He is a Forensic scientist.  He denies tobacco use.  He drinks several beers  a day.  He has no transfusion history.  REVIEW OF SYSTEMS:  Per HPI.  PHYSICAL EXAMINATION:  VITAL SIGNS:  Temperature 99, heart rate 81, respirations 20, blood pressure 151/94, weight 186.6 pounds, height 68 inches.  GENERAL:  Pleasant man, in no acute distress. HEENT:  Normocephalic, atraumatic.  Pupils equal, round, reactive to light.  Extraocular movement intact.  Sclerae anicteric.  No thrush. Fullness/exudate at the left tonsillar fossa. LYMPHS:  No palpable cervical, supraclavicular, or axillary lymph nodes. CHEST/LUNGS:  Clear bilaterally.  No wheezes or rales. CARDIOVASCULAR:  Regular rate and rhythm. ABDOMEN:  Soft and nontender.  No organomegaly. EXTREMITIES:  No edema.  Calves soft and nontender. NEUROLOGIC:  Motor strength 5/5.  Knee DTRs 2+, symmetric.  He is alert and oriented.  Finger-to-nose intact.  IMPRESSION AND PLAN: 1. Recurrent large B-cell lymphoma, involving the left tonsillar     region, CD20 positive.  Staging PET scan on August 23, 2010, with     increased FDG activity at the left tonsillar fossa and no     additional evidence of lymphoma.  Staging bone marrow biopsy     negative for involvement with  lymphoma. 2. History of neutropenia secondary to Rituxan. 3. Intravenous access.  He will need a Port-A-Cath placement. 4. Hypertension.  Mr. Kurka is being admitted for cycle 1 RICE chemotherapy.  We reviewed potential toxicities, including myelosuppression, nausea, vomiting, mouth sores, diarrhea, alopecia, and allergic reaction.  We discussed bladder toxicity and neurotoxicity with ifosfamide.  We discussed an allergic reaction, a rate related reaction, reactivation of hepatitis B, and leukoencephalopathy with Rituxan.  He is agreeable to proceed. Dr. Truett Perna discussed his case with Dr. Jacqulyn Bath at [facility].  The plan is for RICE chemotherapy followed by radiation to the left pharynx.  The patient interviewed and examined by Dr. Truett Perna; plan per  Dr. Truett Perna.     Arnaldo Natal, NP   ______________________________ Ladene Artist, M.D.    LCT/MEDQ  D:  09/20/2010  T:  09/20/2010  Job:  [id number]  Electronically Signed by Lonna Cobb N.P. on 09/27/2010 04:43:32 PM Electronically Signed by Thornton Papas M.D. on 10/23/2010 08:40:13 AM

## 2010-10-23 NOTE — Discharge Summary (Signed)
  NAMEWEST, [name]                   ACCOUNT NO.:  [id number]  MEDICAL RECORD NO.:  [id number]  LOCATION:  1335                         FACILITY:  [facility]  PHYSICIAN:  Ladene Artist, M.D.  DATE OF BIRTH:  [birth year]  DATE OF ADMISSION:  09/19/2010 DATE OF DISCHARGE:  09/21/2010                              DISCHARGE SUMMARY   CONDITION ON DISCHARGE:  Stable.  DISCHARGE DIAGNOSES: 1. Recurrent non-Hodgkin lymphoma 1A, status post cycle #1 of R-ICE     chemotherapy given during this hospital admission. 2. Status post Port-A-Cath placement on September 19, 2010. 3. Large B-cell lymphoma involving the left tonsil and right posterior     pharynx diagnosed in September 2005, status post 6 cycles of     CHOP/rituximab therapy. 4. History of neutropenia secondary to rituximab. 5. History of hypertension.  HOSPITAL CONSULTS:  Oley Balm, M.D. Interventional Radiology.  HOSPITAL PROCEDURES: 1. Port-A-Cath placement. 2. Infusional chemotherapy.  HOSPITAL COURSE:  [name] is a 50 year old with recurrent non-Hodgkin lymphoma involving the left pharynx.  He was admitted electively on August 28, for a first cycle of salvage chemotherapy with the R-ICE regimen.  On the day of admission, he underwent placement of a Port-A-Cath in Interventional Radiology by Dr. Deanne Coffer.  He tolerated the procedure well.  Following the Port-A-Cath placement, the chemotherapy was started.  The chemotherapy was given as follows.  Carboplatin (AUC equal 5) was given at a dose of 550 mg on day 1 only.  Etoposide (100 mg per meter square) given at a dose of 200 mg by intravenous infusion daily for 3 days beginning on day 1.  Ifosfamide (5000 mg meter square) was given at a dose of 10,000 mg by intravenous infusion over 24 hours beginning on day 2.  Mesna (5000 mg per meter square) was given at a dose of 10,000 mg by intravenous infusion over 24 hours beginning on day 2.  The mesna was mixed with  ifosfamide.  On day 1, he received rituximab (375 mg per meter square) at a dose of 750 mg by intravenous infusion.  He tolerated the chemotherapy without significant acute toxicity.  On September 21, 2010, he was noted to have mild confluent erythema over the face, neck, and upper chest/back.  He was asymptomatic from the erythema and there were no hives.  He appeared stable for discharge following the completion of chemotherapy on September 21, 2010.  DISCHARGE MEDICATIONS: 1. Compazine 10 mg q.6 h p.r.n. nausea. 2. EMLA cream - applied to Port-A-Cath site as directed.  Followup care will be with Dr. Truett Perna at the cancer center for a Neulasta injection on September 22, 2010 and then an office visit as scheduled.  He will call for a fever or bleeding.     Ladene Artist, M.D.     GBS/MEDQ  D:  09/21/2010  T:  09/21/2010  Job:  [id number]  cc:   Thora Lance, M.D. Fax: [phone]  Jefry H. Pollyann Kennedy, MD Fax: [phone]  Maryln Gottron, M.D. Fax: [phone]  Verita Schneiders, MD Bone [facility] Trans[facility]  Electronically Signed by Thornton Papas M.D. on 10/23/2010 08:40:11 AM

## 2010-10-25 ENCOUNTER — Other Ambulatory Visit: Payer: Self-pay | Admitting: Oncology

## 2010-10-25 ENCOUNTER — Encounter (HOSPITAL_BASED_OUTPATIENT_CLINIC_OR_DEPARTMENT_OTHER): Payer: Managed Care, Other (non HMO) | Admitting: Oncology

## 2010-10-25 DIAGNOSIS — C8589 Other specified types of non-Hodgkin lymphoma, extranodal and solid organ sites: Secondary | ICD-10-CM

## 2010-10-25 LAB — MANUAL DIFFERENTIAL
ALC: 1.6 10*3/uL (ref 0.9–3.3)
ANC (CHCC manual diff): 35.6 10*3/uL — ABNORMAL HIGH (ref 1.5–6.5)
Blasts: 0 % (ref 0–0)
Myelocytes: 4 % — ABNORMAL HIGH (ref 0–0)
Other Cell: 0 % (ref 0–0)
PROMYELO: 0 % (ref 0–0)
SEG: 47 % (ref 38–77)
Variant Lymph: 0 % (ref 0–0)

## 2010-10-25 LAB — CBC WITH DIFFERENTIAL/PLATELET
Platelets: 26 10*3/uL — ABNORMAL LOW (ref 140–400)
RBC: 3.15 10*6/uL — ABNORMAL LOW (ref 4.20–5.82)
RDW: 13.3 % (ref 11.0–14.6)
WBC: 39.1 10*3/uL — ABNORMAL HIGH (ref 4.0–10.3)

## 2010-10-30 ENCOUNTER — Encounter (HOSPITAL_BASED_OUTPATIENT_CLINIC_OR_DEPARTMENT_OTHER): Payer: Managed Care, Other (non HMO) | Admitting: Oncology

## 2010-10-30 ENCOUNTER — Other Ambulatory Visit: Payer: Self-pay | Admitting: Oncology

## 2010-10-30 DIAGNOSIS — C8589 Other specified types of non-Hodgkin lymphoma, extranodal and solid organ sites: Secondary | ICD-10-CM

## 2010-10-30 LAB — CBC WITH DIFFERENTIAL/PLATELET
Basophils Absolute: 0.1 10*3/uL (ref 0.0–0.1)
EOS%: 0.2 % (ref 0.0–7.0)
Eosinophils Absolute: 0 10*3/uL (ref 0.0–0.5)
HCT: 27.4 % — ABNORMAL LOW (ref 38.4–49.9)
HGB: 9.6 g/dL — ABNORMAL LOW (ref 13.0–17.1)
MCH: 29.7 pg (ref 27.2–33.4)
MCV: 84.8 fL (ref 79.3–98.0)
MONO%: 10.6 % (ref 0.0–14.0)
NEUT#: 11.3 10*3/uL — ABNORMAL HIGH (ref 1.5–6.5)
NEUT%: 82.7 % — ABNORMAL HIGH (ref 39.0–75.0)

## 2010-10-31 ENCOUNTER — Inpatient Hospital Stay (HOSPITAL_COMMUNITY)
Admission: AD | Admit: 2010-10-31 | Discharge: 2010-11-02 | DRG: 847 | Disposition: A | Discharge status: Home / Self Care | Payer: Managed Care, Other (non HMO) | Source: Ambulatory Visit | Attending: Oncology | Admitting: Oncology

## 2010-10-31 DIAGNOSIS — C8589 Other specified types of non-Hodgkin lymphoma, extranodal and solid organ sites: Secondary | ICD-10-CM

## 2010-10-31 DIAGNOSIS — Z5111 Encounter for antineoplastic chemotherapy: Principal | ICD-10-CM

## 2010-10-31 DIAGNOSIS — R110 Nausea: Secondary | ICD-10-CM | POA: Diagnosis not present

## 2010-10-31 DIAGNOSIS — C8581 Other specified types of non-Hodgkin lymphoma, lymph nodes of head, face, and neck: Secondary | ICD-10-CM | POA: Diagnosis present

## 2010-10-31 DIAGNOSIS — I10 Essential (primary) hypertension: Secondary | ICD-10-CM | POA: Diagnosis present

## 2010-10-31 LAB — URINALYSIS, ROUTINE W REFLEX MICROSCOPIC
Bilirubin Urine: NEGATIVE
Ketones, ur: NEGATIVE mg/dL
Leukocytes, UA: NEGATIVE
Nitrite: NEGATIVE
Protein, ur: NEGATIVE mg/dL
Urobilinogen, UA: 0.2 mg/dL (ref 0.0–1.0)
pH: 5 (ref 5.0–8.0)

## 2010-10-31 LAB — COMPREHENSIVE METABOLIC PANEL
ALT: 54 U/L — ABNORMAL HIGH (ref 0–53)
Alkaline Phosphatase: 46 U/L (ref 39–117)
BUN: 11 mg/dL (ref 6–23)
CO2: 29 mEq/L (ref 19–32)
Calcium: 9.8 mg/dL (ref 8.4–10.5)
GFR calc Af Amer: >90 mL/min (ref >90)
GFR calc non Af Amer: >90 mL/min (ref >90)
Glucose, Bld: 124 mg/dL — ABNORMAL HIGH (ref 70–99)
Potassium: 3.8 mEq/L (ref 3.5–5.1)
Total Protein: 7.2 g/dL (ref 6.0–8.3)

## 2010-11-01 LAB — URINALYSIS, DIPSTICK ONLY
Bilirubin Urine: NEGATIVE
Bilirubin Urine: NEGATIVE
Bilirubin Urine: NEGATIVE
Bilirubin Urine: NEGATIVE
Glucose, UA: NEGATIVE mg/dL
Glucose, UA: NEGATIVE mg/dL
Hgb urine dipstick: NEGATIVE
Hgb urine dipstick: NEGATIVE
Hgb urine dipstick: NEGATIVE
Ketones, ur: >80 mg/dL — AB
Ketones, ur: >80 mg/dL — AB
Ketones, ur: >80 mg/dL — AB
Protein, ur: NEGATIVE mg/dL
Protein, ur: NEGATIVE mg/dL
Specific Gravity, Urine: 1.015 (ref 1.005–1.030)
Urobilinogen, UA: 0.2 mg/dL (ref 0.0–1.0)
pH: 5 (ref 5.0–8.0)
pH: 5.5 (ref 5.0–8.0)

## 2010-11-02 LAB — URINALYSIS, DIPSTICK ONLY
Bilirubin Urine: NEGATIVE
Glucose, UA: NEGATIVE mg/dL
Ketones, ur: NEGATIVE mg/dL
Ketones, ur: NEGATIVE mg/dL
Leukocytes, UA: NEGATIVE
Nitrite: NEGATIVE
Protein, ur: NEGATIVE mg/dL
Urobilinogen, UA: 0.2 mg/dL (ref 0.0–1.0)
pH: 6 (ref 5.0–8.0)

## 2010-11-03 ENCOUNTER — Encounter (HOSPITAL_BASED_OUTPATIENT_CLINIC_OR_DEPARTMENT_OTHER): Payer: Managed Care, Other (non HMO) | Admitting: Oncology

## 2010-11-03 DIAGNOSIS — C8589 Other specified types of non-Hodgkin lymphoma, extranodal and solid organ sites: Secondary | ICD-10-CM

## 2010-11-09 ENCOUNTER — Encounter (HOSPITAL_BASED_OUTPATIENT_CLINIC_OR_DEPARTMENT_OTHER): Payer: Managed Care, Other (non HMO) | Admitting: Oncology

## 2010-11-09 ENCOUNTER — Other Ambulatory Visit: Payer: Self-pay | Admitting: Oncology

## 2010-11-09 DIAGNOSIS — C8589 Other specified types of non-Hodgkin lymphoma, extranodal and solid organ sites: Secondary | ICD-10-CM

## 2010-11-09 LAB — CBC WITH DIFFERENTIAL/PLATELET
BASO%: 5 % — ABNORMAL HIGH (ref 0.0–2.0)
EOS%: 0.5 % (ref 0.0–7.0)
HCT: 21.8 % — ABNORMAL LOW (ref 38.4–49.9)
LYMPH%: 29.4 % (ref 14.0–49.0)
MCH: 29.7 pg (ref 27.2–33.4)
MCHC: 34.9 g/dL (ref 32.0–36.0)
MONO%: 19.4 % — ABNORMAL HIGH (ref 0.0–14.0)
NEUT%: 45.7 % (ref 39.0–75.0)
lymph#: 0.6 10*3/uL — ABNORMAL LOW (ref 0.9–3.3)

## 2010-11-13 ENCOUNTER — Other Ambulatory Visit: Payer: Self-pay | Admitting: Oncology

## 2010-11-13 ENCOUNTER — Encounter (HOSPITAL_BASED_OUTPATIENT_CLINIC_OR_DEPARTMENT_OTHER): Payer: Managed Care, Other (non HMO) | Admitting: Oncology

## 2010-11-13 DIAGNOSIS — C8589 Other specified types of non-Hodgkin lymphoma, extranodal and solid organ sites: Secondary | ICD-10-CM

## 2010-11-13 LAB — CBC WITH DIFFERENTIAL/PLATELET
BASO%: 0 % (ref 0.0–2.0)
MCH: 32.1 pg (ref 27.2–33.4)
MCHC: 35.9 g/dL (ref 32.0–36.0)
MCV: 89.4 fL (ref 79.3–98.0)
NEUT#: 44.1 10*3/uL — ABNORMAL HIGH (ref 1.5–6.5)
RBC: 2.29 10*6/uL — ABNORMAL LOW (ref 4.20–5.82)
RDW: 15.2 % — ABNORMAL HIGH (ref 11.0–14.6)

## 2010-11-15 ENCOUNTER — Encounter (HOSPITAL_BASED_OUTPATIENT_CLINIC_OR_DEPARTMENT_OTHER): Payer: Managed Care, Other (non HMO) | Admitting: Oncology

## 2010-11-15 ENCOUNTER — Other Ambulatory Visit: Payer: Self-pay | Admitting: Oncology

## 2010-11-15 DIAGNOSIS — C8589 Other specified types of non-Hodgkin lymphoma, extranodal and solid organ sites: Secondary | ICD-10-CM

## 2010-11-15 LAB — MANUAL DIFFERENTIAL
ALC: 0.5 10*3/uL — ABNORMAL LOW (ref 0.9–3.3)
EOS: 0 % (ref 0–7)
LYMPH: 1 % — ABNORMAL LOW (ref 14–49)
MONO: 4 % (ref 0–14)
Metamyelocytes: 10 % — ABNORMAL HIGH (ref 0–0)
Variant Lymph: 0 % (ref 0–0)

## 2010-11-15 LAB — CBC WITH DIFFERENTIAL/PLATELET
HCT: 21.5 % — ABNORMAL LOW (ref 38.4–49.9)
MCH: 30.1 pg (ref 27.2–33.4)
MCHC: 33.5 g/dL (ref 32.0–36.0)
MCV: 90 fL (ref 79.3–98.0)
Platelets: 41 10*3/uL — ABNORMAL LOW (ref 140–400)
RBC: 2.39 10*6/uL — ABNORMAL LOW (ref 4.20–5.82)

## 2010-11-20 ENCOUNTER — Other Ambulatory Visit: Payer: Self-pay | Admitting: Oncology

## 2010-11-20 ENCOUNTER — Encounter (HOSPITAL_BASED_OUTPATIENT_CLINIC_OR_DEPARTMENT_OTHER): Payer: Managed Care, Other (non HMO) | Admitting: Oncology

## 2010-11-20 DIAGNOSIS — C8589 Other specified types of non-Hodgkin lymphoma, extranodal and solid organ sites: Secondary | ICD-10-CM

## 2010-11-20 LAB — CBC WITH DIFFERENTIAL/PLATELET
BASO%: 0.5 % (ref 0.0–2.0)
LYMPH%: 8.8 % — ABNORMAL LOW (ref 14.0–49.0)
MCHC: 32.9 g/dL (ref 32.0–36.0)
MCV: 92 fL (ref 79.3–98.0)
MONO#: 0.8 10*3/uL (ref 0.1–0.9)
MONO%: 7.1 % (ref 0.0–14.0)
Platelets: 218 10*3/uL (ref 140–400)
RBC: 2.61 10*6/uL — ABNORMAL LOW (ref 4.20–5.82)
RDW: 20.2 % — ABNORMAL HIGH (ref 11.0–14.6)
WBC: 10.6 10*3/uL — ABNORMAL HIGH (ref 4.0–10.3)
nRBC: 3 % — ABNORMAL HIGH (ref 0–0)

## 2010-11-21 ENCOUNTER — Telehealth: Payer: Self-pay | Admitting: *Deleted

## 2010-11-21 ENCOUNTER — Inpatient Hospital Stay (HOSPITAL_COMMUNITY)
Admission: AD | Admit: 2010-11-21 | Discharge: 2010-11-23 | DRG: 847 | Disposition: A | Discharge status: Home / Self Care | Payer: Managed Care, Other (non HMO) | Source: Ambulatory Visit | Attending: Oncology | Admitting: Oncology

## 2010-11-21 DIAGNOSIS — D702 Other drug-induced agranulocytosis: Secondary | ICD-10-CM | POA: Diagnosis present

## 2010-11-21 DIAGNOSIS — R110 Nausea: Secondary | ICD-10-CM | POA: Diagnosis not present

## 2010-11-21 DIAGNOSIS — T451X5A Adverse effect of antineoplastic and immunosuppressive drugs, initial encounter: Secondary | ICD-10-CM | POA: Diagnosis present

## 2010-11-21 DIAGNOSIS — D649 Anemia, unspecified: Secondary | ICD-10-CM

## 2010-11-21 DIAGNOSIS — Z5111 Encounter for antineoplastic chemotherapy: Secondary | ICD-10-CM

## 2010-11-21 DIAGNOSIS — Z9221 Personal history of antineoplastic chemotherapy: Secondary | ICD-10-CM

## 2010-11-21 DIAGNOSIS — D6481 Anemia due to antineoplastic chemotherapy: Secondary | ICD-10-CM | POA: Diagnosis present

## 2010-11-21 DIAGNOSIS — C8581 Other specified types of non-Hodgkin lymphoma, lymph nodes of head, face, and neck: Secondary | ICD-10-CM | POA: Diagnosis present

## 2010-11-21 DIAGNOSIS — C8589 Other specified types of non-Hodgkin lymphoma, extranodal and solid organ sites: Secondary | ICD-10-CM

## 2010-11-21 DIAGNOSIS — I10 Essential (primary) hypertension: Secondary | ICD-10-CM | POA: Diagnosis present

## 2010-11-21 LAB — URINALYSIS, ROUTINE W REFLEX MICROSCOPIC
Glucose, UA: NEGATIVE mg/dL
Leukocytes, UA: NEGATIVE
Protein, ur: NEGATIVE mg/dL
Specific Gravity, Urine: 1.022 (ref 1.005–1.030)
pH: 6 (ref 5.0–8.0)

## 2010-11-21 LAB — URINALYSIS, DIPSTICK ONLY
Glucose, UA: NEGATIVE mg/dL
Hgb urine dipstick: NEGATIVE
Specific Gravity, Urine: 1.004 — ABNORMAL LOW (ref 1.005–1.030)
Urobilinogen, UA: 0.2 mg/dL (ref 0.0–1.0)

## 2010-11-21 LAB — COMPREHENSIVE METABOLIC PANEL
Albumin: 4 g/dL (ref 3.5–5.2)
BUN: 13 mg/dL (ref 6–23)
Creatinine, Ser: 0.79 mg/dL (ref 0.50–1.35)
Total Protein: 6.8 g/dL (ref 6.0–8.3)

## 2010-11-22 LAB — URINALYSIS, DIPSTICK ONLY
Bilirubin Urine: NEGATIVE
Glucose, UA: NEGATIVE mg/dL
Glucose, UA: NEGATIVE mg/dL
Hgb urine dipstick: NEGATIVE
Ketones, ur: 40 mg/dL — AB
Leukocytes, UA: NEGATIVE
Nitrite: NEGATIVE
Protein, ur: NEGATIVE mg/dL
Specific Gravity, Urine: 1.007 (ref 1.005–1.030)
Specific Gravity, Urine: 1.009 (ref 1.005–1.030)
pH: 5.5 (ref 5.0–8.0)
pH: 6 (ref 5.0–8.0)
pH: 6 (ref 5.0–8.0)

## 2010-11-23 LAB — URINALYSIS, DIPSTICK ONLY
Bilirubin Urine: NEGATIVE
Bilirubin Urine: NEGATIVE
Glucose, UA: NEGATIVE mg/dL
Glucose, UA: NEGATIVE mg/dL
Glucose, UA: NEGATIVE mg/dL
Hgb urine dipstick: NEGATIVE
Hgb urine dipstick: NEGATIVE
Hgb urine dipstick: NEGATIVE
Ketones, ur: NEGATIVE mg/dL
Ketones, ur: NEGATIVE mg/dL
Protein, ur: NEGATIVE mg/dL
Specific Gravity, Urine: 1.008 (ref 1.005–1.030)
pH: 6 (ref 5.0–8.0)
pH: 6.5 (ref 5.0–8.0)
pH: 7.5 (ref 5.0–8.0)

## 2010-11-23 NOTE — H&P (Signed)
[name], [name]                   ACCOUNT NO.:  [id number]  MEDICAL RECORD NO.:  [id number]  LOCATION:  1334                         FACILITY:  [facility]  PHYSICIAN:  Ladene Artist, M.D.  DATE OF BIRTH:  October 02, 1960  DATE OF ADMISSION:  10/31/2010 DATE OF DISCHARGE:                             HISTORY & PHYSICAL   REASON FOR ADMISSION:  Non-Hodgkin lymphoma for cycle 2 RICE chemotherapy.  HISTORY OF PRESENT ILLNESS:  [name] is a 50 year old man with a history of large B-cell lymphoma involving the left tonsil and right posterior pharynx dating to September 2005.  He completed 6 cycles of CHOP chemotherapy and Rituxan.  In July of this year he was found to have recurrent large B-cell lymphoma involving a left pharynx mass. Biopsy of the mass confirmed diffuse large B-cell lymphoma, CD 20 positive.  Staging PET scan on August 23, 2010, showed increased FDG activity at the left tonsillar fossa, and no additional evidence of lymphoma.  Staging bone marrow biopsy was negative for involvement with lymphoma.  [name] completed cycle 1 RICE beginning September 19, 2010, with Neulasta support.  He completed cycle 2 RICE beginning October 10, 2010, also receiving Neulasta support.  [name] presents today to proceed with cycle 3.  PAST MEDICAL HISTORY: 1. Non-Hodgkin lymphoma as above.  PAST SURGICAL HISTORY:  Status post bilateral tonsillectomy, procedure and excision of right posterior pharynx mass on September 20, 2003.  MEDICATIONS: 1. Compazine 10 mg every 6 hours as needed for nausea. 2. Aleve every 12 hours as needed. 3. EMLA cream applied to the Port-A-Cath site as directed.  ALLERGIES: 1. PENICILLIN.  FAMILY HISTORY:  Noncontributory.  There is no family history of cancer.  SOCIAL HISTORY:  [name] lives in [place].  He is married.  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  He drinks several beers a day.  No transfusion history.  REVIEW OF SYSTEMS:  No fevers or  sweats.  No unusual headaches.  No vision change.  He denies mouth sores.  No shortness of breath or cough. No chest pain.  He again developed discomfort at the sternum following the Neulasta injection.  He had nausea during  the ifosfamide infusion with cycle 2.  He continued to have intermittent nausea after the ifosfamide infusion for several days.  Toward the end of the last hospitalization, he noted urinary frequency and dysuria.  The symptoms lasted for several days following his discharge home.  He had no hematuria.  PHYSICAL EXAMINATION:  VITAL SIGNS:  Temperature 98.7, heart rate 79, respirations 24, blood pressure 144/82, oxygen saturation 98% on room air.  Height 68 inches.  Weight 85 kg. GENERAL: Pleasant male in no acute distress. HEENT:  Alopecia.  Pupils equal, round, reactive to light.  Extraocular movements intact.  Sclerae anicteric.  Oropharynx is without thrush or ulceration. LYMPH: No palpable cervical, supraclavicular, or axillary lymph nodes. CHEST: Lungs are clear bilaterally.  No wheezes or rales.  Port-A-Cath site is without erythema. CARDIOVASCULAR:  Regular rate and rhythm. ABDOMEN: Soft and nontender.  No organomegaly. EXTREMITIES:  No edema.  Calves soft and nontender. NEURO: Alert and oriented.  Motor strength 5/5.  Knee DTRs 2+, symmetric.  LABORATORIES:  Hemoglobin 9.6, white count, 13.6, absolute neutrophil count 11.3, platelet count 209,000.  Sodium 136, potassium 3.8, glucose 124, BUN 11, creatinine 0.74, bilirubin 0.5, alkaline phosphatase 46, SGOT 37, SGPT 54, total protein 7.2, albumin 4.0, calcium is 9.8.  IMPRESSION AND PLAN: 1. Recurrent large B-cell non-Hodgkin lymphoma involving the left     tonsillar region, CD 20 positive, status post cycle 2 RICE     beginning on October 10, 2010. 2. Status post Port-A-Cath placement on September 19, 2010. 3. History of neutropenia secondary to Rituxan. 4. Hypertension. 5. Urinary frequency and  dysuria with cycle 2 RICE, question if     related to ifosfamide. 6. Thrombocytopenia following cycle 2 RICE. [name] is a 50 year old male with recurrent large B-cell non-Hodgkin lymphoma, CD 20 positive, involving the left tonsillar region.  He has completed 2 cycles of RICE chemotherapy.  He presents today for elective admission to proceed with cycle 3.  The chemotherapy doses will be reduced by 10% with cycle 3 due to thrombocytopenia following cycle 2. Intravenous hydration will be increased with cycle 3 due to urinary symptoms with cycle 2 RICE.  The patient interviewed and examined by Dr. Truett Perna; plan per Dr. Truett Perna.     Arnaldo Natal, NP   ______________________________ Ladene Artist, M.D.    LCT/MEDQ  D:  10/31/2010  T:  10/31/2010  Job:  [id number]  Electronically Signed by Lonna Cobb N.P. on 11/01/2010 04:58:35 PM Electronically Signed by Thornton Papas M.D. on 11/23/2010 09:33:46 AM

## 2010-11-23 NOTE — H&P (Signed)
[name], [name]                   ACCOUNT NO.:  [id number]  MEDICAL RECORD NO.:  [id number]  LOCATION:  1311                         FACILITY:  [facility]  PHYSICIAN:  Quenton Fetter, MD      DATE OF BIRTH:  [birth year]  DATE OF ADMISSION:  10/10/2010 DATE OF DISCHARGE:                             HISTORY & PHYSICAL   REASON FOR ADMISSION:  Non-Hodgkin's lymphoma, for cycle 2 RICE.  HISTORY OF PRESENT ILLNESS:  [name] is a 50 year old man with a history of large B-cell lymphoma involving the left tonsil and right posterior pharynx dating to September 2005.  He completed six cycles of CHOP chemotherapy and Rituxan.  In July of this year, he was found to have recurrent large B-cell lymphoma involving a left pharynx mass. 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  Staging PET scan on August 23, 2010, showed increased FDG activity at the left tonsillar fossa and no additional evidence of lymphoma.  Staging bone marrow biopsy was negative for involvement with lymphoma.  [name] completed cycle 1 RICE beginning 09/19/2010.  He received Neulasta support.  [name] presents today for elective admission to proceed with cycle 2. He has no complaints.  He denies fever or sweats.  No nausea or vomiting.  He denies hematuria.  About 7 to 8 days after the Neulasta injection, he developed "aching" discomfort at the sternum.  He took Aleve with relief of the pain.  PAST MEDICAL HISTORY:  Non-Hodgkin's lymphoma as above.  PAST SURGICAL HISTORY:  Status post bilateral tonsillectomy procedure and excision of right posterior pharynx mass on September 20, 2003.  MEDICATIONS:  Aleve as needed, Emla creme.  ALLERGIES:  PENICILLIN.  FAMILY HISTORY:  Noncontributory.  There is no family history of cancer.  SOCIAL HISTORY:  [name] lives in [place].  He is married.  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  He drinks several beers a day.  No transfusion  history.  REVIEW OF SYSTEMS:  [name] reports that overall he feels well.  He has a good appetite and overall good energy level.  No fevers or sweats.  He denies pain.  No mouth sores.  No unusual headaches.  No vision change. He denies nausea or vomiting.  No constipation or diarrhea.  He denies shortness of breath or cough.  No chest pain.  No dysphagia.  No hematuria or dysuria.  As noted in the HPI, he developed aching discomfort of the sternum approximately 7 to 8 days after the Neulasta injection.  He took Aleve as needed with good relief.  PHYSICAL EXAM:  VITAL SIGNS:  Temperature 98.4, heart rate 80, respirations 20, blood pressure 132/87.  Height 68 inches, weight 188 pounds.  GENERAL:  Pleasant male in no acute distress. HEENT:  Normocephalic, atraumatic.  Pupils equal, round and reactive to light.  Extraocular movements intact.  Sclerae anicteric.  Oropharynx is without thrush or ulceration.  There is erythema at the left tonsillar fossa. LYMPHATIC:  No palpable cervical, supraclavicular or axillary lymph nodes. CHEST/LUNGS:  Clear bilaterally.  No wheezes or rales.  Port-A-Cath site is without erythema. CARDIOVASCULAR:  Regular rate and rhythm. ABDOMEN:  Soft and nontender.  No organomegaly. EXTREMITIES:  No edema.  Calves are soft and nontender. NEUROLOGIC:  Alert and oriented.  Motor strength 5/5.  Knee DTRs 2+, symmetric.  Finger-to-nose and rapid alternating movement intact. SKIN:  Posterior low neck with an approximate 1 cm indurated skin lesion.  LABORATORY DATA:  Hemoglobin 11.9, white count 8.5, absolute neutrophil count 6.7, platelet count rate 334,000.  IMPRESSION AND PLAN: 1. Recurrent large B-cell non-Hodgkin's lymphoma involving the left     tonsillar region, CD20 positive, status post cycle 1 RICE beginning     September 19, 2010. 2. Status post Port-A-Cath placement September 19, 2010. 3. Indurated skin lesion at the posterior neck, question sebaceous      cyst. 4. History of neutropenia secondary to Rituxan. 5. Hypertension.  [name] is being admitted for cycle 2 RICE chemotherapy.  The patient was interviewed and examined by Dr. Truett Perna; plan per Dr. Truett Perna.  Dictated for:  Nena Alexander, MD     Arnaldo Natal, NP     LCT/MEDQ  D:  10/10/2010  T:  10/10/2010  Job:  [id number]  Electronically Signed by Lonna Cobb N.P. on 10/16/2010 04:57:59 PM Electronically Signed by Thornton Papas M.D. on 11/23/2010 09:33:38 AM

## 2010-11-23 NOTE — Discharge Summary (Signed)
[name], [name]                   ACCOUNT NO.:  [id number]  MEDICAL RECORD NO.:  [id number]  LOCATION:  1334                         FACILITY:  [facility]  PHYSICIAN:  Ladene Artist, M.D.  DATE OF BIRTH:  [birth year]  DATE OF ADMISSION:  10/31/2010 DATE OF DISCHARGE:  11/02/2010                              DISCHARGE SUMMARY   CONDITION AT DISCHARGE:  Stable.  DISCHARGE DIAGNOSES: 1. Admission for cycle #3 of R-ICE chemotherapy given for treatment of     recurrent non-Hodgkin lymphoma. 2. Status post Port-A-Cath placement on September 19, 2010. 3. 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     a.     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     b.     A staging bone marrow biopsy was negative and a staging PET      scan on August 23, 2010, showed increased FDG activity at the left      tonsillar fossa and no additional evidence of lymphoma. 4. History of neutropenia secondary to rituximab. 5. History of hypertension.  HOSPITAL CONSULTATIONS:  None.  HOSPITAL PROCEDURES:  Infusional chemotherapy.  HOSPITAL COURSE:  [name] is a 50 year old with a history of recurrent non-Hodgkin lymphoma.  He has completed 2 cycles of salvage therapy with R-ICE regimen.  A repeat ENT evaluation by Dr. Pollyann Kennedy prior to this hospital admission, confirmed a complete clinical response.  He was admitted electively on October 9 for cycle #3 of R-ICE chemotherapy.  He developed severe thrombocytopenia following cycle #2.  He also developed mild dysuria.  We decided to dose reduce the chemotherapy by 10% with cycle #3.  He was admitted on October 9 and placed on intravenous hydration with normal saline.  The chemotherapy was given as follows:  Etoposide at a dose of 90  mg/meters squared was given at a dose of 180 mg by intravenous infusion daily for 3 days.  Carboplatin was given at a dose of 500 mg by intravenous infusion on day #1 only.  Ifosfamide was given at a dose of 9000 mg by intravenous infusion over 24 hours for 1 dose beginning at the completion of etoposide/carboplatin chemotherapy on day #1.  [place] was given at a dose of 9000 mg mixed with the ifosfamide.  On day #3 (October 11) he was treated with rituximab at a dose of 375 mg/meters squared (750 mg) per protocol.  He received the final dose of etoposide on October 11.  [name] tolerated the chemotherapy without significant acute toxicity. He did not have significant dysuria with this cycle of chemotherapy. The urine remained negative for blood.  He appeared stable for discharge on October 11.  DISCHARGE MEDICATIONS: 1. Compazine 10 mg q.6 hours p.r.n. 2. Aleve 220 mg q.12 hours p.r.n. 3. Ibuprofen 1 tablet q.8 hours p.r.n.  FOLLOWUP:  Followup care will be at the cancer center on  October 12 for a Neulasta injection.  He will see Dr. Truett Perna as scheduled.     Ladene Artist, M.D.     GBS/MEDQ  D:  11/02/2010  T:  11/02/2010  Job:  [id number]  cc:   Enrigue Catena H. Pollyann Kennedy, MD Fax: [phone]  Dr. Ballard Russell  Electronically Signed by Thornton Papas M.D. on 11/23/2010 09:33:41 AM

## 2010-11-24 ENCOUNTER — Encounter (HOSPITAL_BASED_OUTPATIENT_CLINIC_OR_DEPARTMENT_OTHER): Payer: Managed Care, Other (non HMO) | Admitting: Oncology

## 2010-11-24 DIAGNOSIS — C8589 Other specified types of non-Hodgkin lymphoma, extranodal and solid organ sites: Secondary | ICD-10-CM

## 2010-11-28 ENCOUNTER — Ambulatory Visit: Payer: Managed Care, Other (non HMO)

## 2010-11-28 ENCOUNTER — Ambulatory Visit: Payer: Managed Care, Other (non HMO) | Admitting: Radiation Oncology

## 2010-11-29 ENCOUNTER — Encounter: Payer: Self-pay | Admitting: *Deleted

## 2010-11-30 ENCOUNTER — Other Ambulatory Visit: Payer: Self-pay | Admitting: *Deleted

## 2010-11-30 ENCOUNTER — Encounter: Payer: Self-pay | Admitting: Radiation Oncology

## 2010-11-30 ENCOUNTER — Ambulatory Visit
Admission: RE | Admit: 2010-11-30 | Discharge: 2010-11-30 | Disposition: A | Discharge status: Home / Self Care | Payer: Managed Care, Other (non HMO) | Source: Ambulatory Visit | Attending: Radiation Oncology | Admitting: Radiation Oncology

## 2010-11-30 ENCOUNTER — Encounter (HOSPITAL_COMMUNITY)
Admission: RE | Admit: 2010-11-30 | Discharge: 2010-11-30 | Disposition: A | Discharge status: Home / Self Care | Payer: Managed Care, Other (non HMO) | Source: Ambulatory Visit | Attending: Oncology | Admitting: Oncology

## 2010-11-30 ENCOUNTER — Ambulatory Visit: Payer: Self-pay

## 2010-11-30 ENCOUNTER — Other Ambulatory Visit (HOSPITAL_BASED_OUTPATIENT_CLINIC_OR_DEPARTMENT_OTHER): Payer: Managed Care, Other (non HMO) | Admitting: Lab

## 2010-11-30 ENCOUNTER — Other Ambulatory Visit: Payer: Self-pay | Admitting: Oncology

## 2010-11-30 ENCOUNTER — Telehealth: Payer: Self-pay | Admitting: *Deleted

## 2010-11-30 VITALS — BP 121/67 | HR 104 | Temp 98.9°F | Resp 18 | Ht 68.0 in | Wt 187.0 lb

## 2010-11-30 DIAGNOSIS — C8590 Non-Hodgkin lymphoma, unspecified, unspecified site: Secondary | ICD-10-CM

## 2010-11-30 DIAGNOSIS — D61818 Other pancytopenia: Secondary | ICD-10-CM

## 2010-11-30 DIAGNOSIS — C8589 Other specified types of non-Hodgkin lymphoma, extranodal and solid organ sites: Secondary | ICD-10-CM | POA: Insufficient documentation

## 2010-11-30 DIAGNOSIS — Z8572 Personal history of non-Hodgkin lymphomas: Secondary | ICD-10-CM

## 2010-11-30 DIAGNOSIS — D649 Anemia, unspecified: Secondary | ICD-10-CM

## 2010-11-30 DIAGNOSIS — Z510 Encounter for antineoplastic radiation therapy: Secondary | ICD-10-CM | POA: Insufficient documentation

## 2010-11-30 DIAGNOSIS — D702 Other drug-induced agranulocytosis: Secondary | ICD-10-CM

## 2010-11-30 LAB — CBC WITH DIFFERENTIAL/PLATELET
BASO%: 2.1 % — ABNORMAL HIGH (ref 0.0–2.0)
HCT: 17.8 % — ABNORMAL LOW (ref 38.4–49.9)
MCHC: 35.7 g/dL (ref 32.0–36.0)
MONO#: 0.2 10*3/uL (ref 0.1–0.9)
RBC: 1.9 10*6/uL — ABNORMAL LOW (ref 4.20–5.82)
WBC: 1.5 10*3/uL — ABNORMAL LOW (ref 4.0–10.3)
lymph#: 0.5 10*3/uL — ABNORMAL LOW (ref 0.9–3.3)

## 2010-11-30 MED ORDER — HEPARIN (PORCINE) LOCK FLUSH 10 UNIT/ML IV SOLN: 10.0000 [IU] | Freq: Once | INTRAVENOUS | Status: AC

## 2010-11-30 MED ORDER — CIPROFLOXACIN HCL 500 MG PO TABS: 500.0000 mg | ORAL_TABLET | Freq: Two times a day (BID) | ORAL | Status: AC

## 2010-11-30 MED ORDER — SODIUM CHLORIDE 0.9 % IJ SOLN
10.0000 mL | INTRAMUSCULAR | Status: AC | PRN
  Filled 2010-11-30: qty 10

## 2010-11-30 NOTE — Progress Notes (Signed)
[facility] Radiation Oncology Follow up Note  Name: [name] MRN: [id number]  Date: 11/30/2010  DOB: 28-Nov-1960  CC: Don Mountain, MD, MD  [name] [name],*  DIAGNOSIS:Non Hodgkin's Lymphoma Initial Stage IBE.  ALLERGIES: Penicillins   MEDICATIONS:Current outpatient prescriptions:ciprofloxacin (CIPRO) 500 MG tablet, Take 1 tablet (500 mg total) by mouth 2 (two) times daily., Disp: 10 tablet, Rfl: 0;  ibuprofen (ADVIL,MOTRIN) 200 MG tablet, Take 200 mg by mouth every 12 (twelve) hours as needed. For pain, Disp: , Rfl: ;  naproxen sodium (ANAPROX) 220 MG tablet, Take 220 mg by mouth every 12 (twelve) hours as needed. For pain , Disp: , Rfl:  ondansetron (ZOFRAN) 8 MG tablet, Take 8 mg by mouth every 8 (eight) hours as needed.  , Disp: , Rfl: ;  prochlorperazine (COMPAZINE) 10 MG tablet, Take 10 mg by mouth every 6 (six) hours as needed. For nausea   , Disp: , Rfl:  No current facility-administered medications for this encounter. Facility-Administered Medications Ordered in Other Encounters: heparin flush 10 UNIT/ML injection 10 Units, 10 Units, Intravenous, Once, Don Shutters, MD;  sodium chloride 0.9 % injection 10 mL, 10 mL, Intravenous, PRN, Don Shutters, MD   NARRATIVE: The patient is seen today for consideration of radiation therapy in the management of his recurrent non-Hodgkin's lymphoma. I last saw [name] on 09/12/2010 at which time he was undergoing salvage chemotherapy for his stage I BE recurrent non-Hodgkin's lymphoma. He completed his 4 cycles of chemotherapy last week. His treatment was well tolerated although he did require Neulasta support. He tells me that he is scheduled for red blood cell transfusions tomorrow for his anemia. He is without complaints today. He denies night sweats, fevers, or weight loss. His restaging PET scan on 08/23/2010 was without evidence for residual activity within the left tonsillar region. [name] of  the ENT and felt that he had a complete response.  PHYSICAL EXAM:  height is 5\' 8"  (1.727 m) and weight is 187 lb (84.823 kg). His oral temperature is 98.9 F (37.2 C). His blood pressure is 121/67 and his pulse is 104. His respiration is 18.  Head and neck examination: Nodes: Without palpable lymphadenopathy in the neck or periauricular region. Oral cavity and oropharynx unremarkable to inspection. Indirect and your examination confirmatory. Left oropharynx normal to palpation. Neurologic examination nonfocal.   LABORATORY DATA: Hemoglobin 6.4 white blood count 1.5 K. platelet count 52K.          IMPRESSION: Recurrent non-Hodgkin's, now back in remission. I do recommend consolidative radiation therapy to his left oropharynx/tonsil. This will be done in a conventional fashion. [name] would like to wait at least 3-6 weeks for his bone marrow to recover. To finish by the end of the year we do we would need to have him return for treatment planning on November 19 and start his radiation therapy by November 26. I discussed the potential acute and late toxicities of radiation therapy and he wishes to proceed as outlined. We'll need to have obstruction of his scatter protection devices by [name] prior to his simulation on November 19. Consent was signed today.   PLAN: As above.   I spent 30 minutes minutes face to face with the patient and more than 50% of that time was spent in counseling and/or coordination of care.

## 2010-11-30 NOTE — Progress Notes (Signed)
Please see the Nurse Progress Note in the MD Initial Consult Encounter for this patient. 

## 2010-11-30 NOTE — Telephone Encounter (Signed)
Patient feeling a little light headed and agrees to transfusion 11/9 or 11/10---scheduler notified to call patient on cell w/appt. Called in Cipro 500mg  bid X 5 days for neutropenia per Lonna Cobb, NP.

## 2010-11-30 NOTE — Progress Notes (Signed)
FINISHED RICE CHEMO LAST THURS, NOV. 1, 2012.    DR. Pollyann Kennedy HAS LOOKED AT LYMPHOMA ON NECK AND SAID IT WAS VIRTUALLY GONE AFTER 2 TX.  SCHEDULED FOR RBC TRANSFUSION TOMORROW WITH DR. SHERRILL.

## 2010-12-01 ENCOUNTER — Ambulatory Visit (HOSPITAL_BASED_OUTPATIENT_CLINIC_OR_DEPARTMENT_OTHER): Payer: Managed Care, Other (non HMO)

## 2010-12-01 VITALS — BP 143/80 | HR 74 | Temp 98.3°F | Resp 18

## 2010-12-01 DIAGNOSIS — D649 Anemia, unspecified: Secondary | ICD-10-CM

## 2010-12-01 LAB — HOLD TUBE, BLOOD BANK

## 2010-12-01 MED ORDER — SODIUM CHLORIDE 0.9 % IJ SOLN
10.0000 mL | INTRAMUSCULAR | Status: DC | PRN
  Administered 2010-12-01: 10 mL via INTRAVENOUS
  Filled 2010-12-01: qty 10

## 2010-12-01 MED ORDER — HEPARIN SOD (PORK) LOCK FLUSH 100 UNIT/ML IV SOLN
500.0000 [IU] | Freq: Once | INTRAVENOUS | Status: AC
  Administered 2010-12-01: 500 [IU] via INTRAVENOUS
  Filled 2010-12-01: qty 5

## 2010-12-01 MED ORDER — SODIUM CHLORIDE 0.9 % IV SOLN
INTRAVENOUS | Status: DC
  Administered 2010-12-01: 12:00:00 via INTRAVENOUS

## 2010-12-01 NOTE — Progress Notes (Signed)
Encounter addended by: Amanda Pea, RN on: 12/01/2010  4:43 PM<BR>     Documentation filed: Charges VN

## 2010-12-02 LAB — TYPE AND SCREEN
ABO/RH(D): A POS
Antibody Screen: NEGATIVE
Unit division: 0

## 2010-12-04 ENCOUNTER — Telehealth: Payer: Self-pay | Admitting: *Deleted

## 2010-12-05 ENCOUNTER — Ambulatory Visit (HOSPITAL_COMMUNITY): Payer: Medicaid - Dental | Admitting: Dentistry

## 2010-12-05 DIAGNOSIS — Z0189 Encounter for other specified special examinations: Secondary | ICD-10-CM

## 2010-12-05 NOTE — Patient Instructions (Signed)
FLUORIDE TRAYS PATIENT INSTRUCTIONS    Obtain prescription from the pharmacy.  Don't be surprised if it needs to be ordered.   Be sure to let the pharmacy know when you are close to needing a new refill for them to have it ready for you without interruption of Fluoride use.   The best time to use your Fluoride is before bed time.   You must brush your teeth very well and floss before using the Fluoride in order to get the best use out of the Fluoride treatments.   Place 1 drop of Fluoride gel per tooth in the tray.   Place the tray on your lower teeth and/or your upper teeth.  Make sure the trays are seated all the way.  Remember, they only fit one way on your teeth.   Insert for 5 full minutes.   At the end of the 5 minutes, take the trays out.  SPIT OUT excess. .    Do NOT rinse your mouth!    Do NOT eat or drink after treatments for at least 30 minutes.  This is why the best time for your treatments is before bedtime.    Clean the inside of your Fluoride trays using COLD WATER and a toothbrush.    In order to keep your Trays from discoloring and free from odors, soak them overnight in denture cleaners such as Efferdent.  Do not use bleach or non denture products.    Store the trays in a safe dry place AWAY from any heat until your next treatment.    Bring the trays with you for your next dental check-up.  The dentist will confirm their fit.    If anything happens to your Fluoride trays, or they don't fit as well after any dental work, please let us know as soon as possible.  

## 2010-12-05 NOTE — Progress Notes (Signed)
  BP: 119/64           P: 80            T: 98.0   [name] recently completed chemotherapy for his lymphoma. Patient with anticipated radiation therapy in the near future. Patient now presents for insertion of upper and lower fluoride trays and scatter protection devices.  Procedure: Appliances tried in and were adjusted as needed. [place]. Postop instructions were provided in a written and verbal format concerning the use and care of appliances. All questions were answered. FluoriSHIELD prescription was provided. Normal directions were used. Dispense 4 ounce and refills for one year. RTC for periodic oral examination during radiation therapy in 2 weeks.  Call if questions or problems before then. Dr. Cindra Eves

## 2010-12-08 ENCOUNTER — Other Ambulatory Visit: Payer: Self-pay | Admitting: Oncology

## 2010-12-08 ENCOUNTER — Telehealth: Payer: Self-pay | Admitting: Oncology

## 2010-12-08 ENCOUNTER — Ambulatory Visit (HOSPITAL_BASED_OUTPATIENT_CLINIC_OR_DEPARTMENT_OTHER): Payer: Managed Care, Other (non HMO) | Admitting: Oncology

## 2010-12-08 ENCOUNTER — Other Ambulatory Visit (HOSPITAL_BASED_OUTPATIENT_CLINIC_OR_DEPARTMENT_OTHER): Payer: Managed Care, Other (non HMO)

## 2010-12-08 VITALS — BP 127/82 | HR 96 | Temp 97.8°F | Ht 68.0 in | Wt 188.4 lb

## 2010-12-08 DIAGNOSIS — C8581 Other specified types of non-Hodgkin lymphoma, lymph nodes of head, face, and neck: Secondary | ICD-10-CM

## 2010-12-08 DIAGNOSIS — I10 Essential (primary) hypertension: Secondary | ICD-10-CM

## 2010-12-08 DIAGNOSIS — D6481 Anemia due to antineoplastic chemotherapy: Secondary | ICD-10-CM

## 2010-12-08 DIAGNOSIS — C8589 Other specified types of non-Hodgkin lymphoma, extranodal and solid organ sites: Secondary | ICD-10-CM

## 2010-12-08 DIAGNOSIS — T451X5A Adverse effect of antineoplastic and immunosuppressive drugs, initial encounter: Secondary | ICD-10-CM

## 2010-12-08 LAB — CBC WITH DIFFERENTIAL/PLATELET
Basophils Absolute: 0 10*3/uL (ref 0.0–0.1)
EOS%: 0.2 % (ref 0.0–7.0)
Eosinophils Absolute: 0 10*3/uL (ref 0.0–0.5)
LYMPH%: 5.3 % — ABNORMAL LOW (ref 14.0–49.0)
MCH: 33.1 pg (ref 27.2–33.4)
MCV: 96.3 fL (ref 79.3–98.0)
MONO%: 3.5 % (ref 0.0–14.0)
NEUT#: 14.7 10*3/uL — ABNORMAL HIGH (ref 1.5–6.5)
Platelets: 137 10*3/uL — ABNORMAL LOW (ref 140–400)
RBC: 2.58 10*6/uL — ABNORMAL LOW (ref 4.20–5.82)
RDW: 18.6 % — ABNORMAL HIGH (ref 11.0–14.6)

## 2010-12-08 LAB — TECHNOLOGIST REVIEW

## 2010-12-08 LAB — HOLD TUBE, BLOOD BANK

## 2010-12-08 NOTE — Progress Notes (Signed)
OFFICE PROGRESS NOTE   INTERVAL HISTORY:   [name] returns as scheduled. He completed a fourth cycle of R-ICE chemotherapy beginning on October 30. He developed profound malaise last week and the hemoglobin returned at 6.4 on November 8. He was transfused 2 units of packed red blood cells and reports feeling better immediately. He feels well today. He denies dysuria.  He is scheduled to begin radiation on November 26.  Objective:  Vital signs in last 24 hours:  Blood pressure 127/82, pulse 96, temperature 97.8 F (36.6 C), height 5\' 8"  (1.727 m), weight 188 lb 6.4 oz (85.458 kg).    HEENT: No thrush. Resp: Lungs clear bilaterally. Cardio: Regular rate and rhythm GI: Nontender. No hepatosplenomegaly. Vascular: No leg edema.    Portacath/PICC-without erythema  Lab Results:  CBC  Lab Results  Component Value Date   WBC 16.2* 12/08/2010   HGB 8.5* 12/08/2010   HCT 24.8* 12/08/2010   MCV 96.3 12/08/2010   PLT 137* 12/08/2010    Chemistry:      Component Value Date/Time   NA 138 11/21/2010 1108   K 4.1 11/21/2010 1108   CL 102 11/21/2010 1108   CO2 27 11/21/2010 1108   GLUCOSE 124* 11/21/2010 1108   BUN 13 11/21/2010 1108   CREATININE 0.79 11/21/2010 1108   CALCIUM 9.3 11/21/2010 1108   PROT 6.8 11/21/2010 1108   ALBUMIN 4.0 11/21/2010 1108   AST 38* 11/21/2010 1108   ALT 57* 11/21/2010 1108   ALKPHOS 45 11/21/2010 1108   BILITOT 0.6 11/21/2010 1108   GFRNONAA >90 11/21/2010 1108   GFRAA >90 11/21/2010 1108       Medications: I have reviewed the patient's current medications.  Assessment/Plan: 1. 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  He entered clinical remission following chemotherapy and remained in remission when he was seen at the cancer center 02/24/2009. 2. Recurrent large B-cell lymphoma involving a left pharynx mass July 2012,  status post a biopsy 08/11/2010 confirming a diffuse large B-cell lymphoma, CD20 positive, IIA.  Staging PET scan 08/23/2010 with increased FDG activity at the left tonsillar fossa and no additional evidence of lymphoma.  -He completed 4 cycles of R-ICE/rituximab with cycle #1 beginning on 09/19/2010 and cycle #4 on 11/21/2010.      -Repeat head and neck examination by Dr. Pollyann Kennedy following R-ICE/rituximab showed no residual lymphoma. 3. History of neutropenia secondary to rituximab, resolved. 4. Anemia secondary to chemotherapy, status post a red blood cell transfusion 11/30/2010. 5.      Hypertension   Disposition:  Mr. Dinino has completed the planned course of salvage systemic therapy. He appears to be in clinical remission from the non-Hodgkin's lymphoma. The hematologic indices are recovering. He will begin radiation consolidation on November 26. He will return for a repeat CBC on November 29. He is scheduled for an office visit on December 19. We will arrange for removal of the Port-A-Cath after the next office visit.   Lucile Shutters, MD  12/08/2010  2:54 PM

## 2010-12-08 NOTE — Telephone Encounter (Signed)
gve the pt his nov,dec 2012 appt calendar °

## 2010-12-09 ENCOUNTER — Other Ambulatory Visit: Payer: Self-pay | Admitting: Oncology

## 2010-12-11 ENCOUNTER — Ambulatory Visit
Admission: RE | Admit: 2010-12-11 | Discharge: 2010-12-11 | Disposition: A | Discharge status: Home / Self Care | Payer: Managed Care, Other (non HMO) | Source: Ambulatory Visit | Attending: Radiation Oncology | Admitting: Radiation Oncology

## 2010-12-11 ENCOUNTER — Telehealth: Payer: Self-pay | Admitting: Radiation Oncology

## 2010-12-11 DIAGNOSIS — C8590 Non-Hodgkin lymphoma, unspecified, unspecified site: Secondary | ICD-10-CM

## 2010-12-11 NOTE — Telephone Encounter (Signed)
Met with patient to discuss RO billing. Monia Pouch does not require pre cert for outpt participating providers for extrl beam [phone]) rad tx. Ref Nbr [id number] Diagnosis: 202.80 Other lymphomas, unspecified site, Non Hodgkins Lymp.

## 2010-12-11 NOTE — Progress Notes (Signed)
[facility] Radiation Oncology Simulation and Treatment Planning Note   Name: [name] MRN: [id number]  Date: 12/11/2010  DOB: [birth year]  Status:outpatient    DIAGNOSIS: The encounter diagnosis was Non Hodgkin's lymphoma.     CONSENT VERIFIED:yes   SET UP: Patient is setup supine   IMMOBILIZATION: Following immobilization is used:Aquaplast Mask   NARRATIVE:The patient was brought to the CT Simulation planning suite.  Identity was confirmed.  All relevant records and images related to the planned course of therapy were reviewed.  Then, the patient was positioned in a stable reproducible clinical set-up for radiation therapy.  CT images were obtained.  Skin markings were placed.  The CT images were loaded into the planning software where the target and avoidance structures were contoured.  The radiation prescription was entered and confirmed.   TREATMENT PLANNING NOTE:  Treatment planning then occurred. I have requested :2  MLC's and DVHs for the parotid glands, spinal cord and target. I have requested an isodose plan and 3-D simulation.

## 2010-12-13 ENCOUNTER — Encounter: Payer: Self-pay | Admitting: Radiation Oncology

## 2010-12-13 NOTE — Progress Notes (Signed)
3-D simulation note:  Mr. Shaff underwent 3-D simulation today in the management of his non-Hodgkin's lymphoma. He was set up to LAO and LPO fields. Dose fine histograms were obtained for the parotid glands, spinal cord, and target. 2 separate multileaf collimators were designed to conform the field. I prescribing 4500 cGy in 25 sessions utilizing 6 MV photons. I chose the 100% isodose curve for the prescription dose.

## 2010-12-18 ENCOUNTER — Other Ambulatory Visit: Payer: Self-pay | Admitting: Radiation Oncology

## 2010-12-18 ENCOUNTER — Ambulatory Visit
Admission: RE | Admit: 2010-12-18 | Discharge: 2010-12-18 | Disposition: A | Discharge status: Home / Self Care | Payer: Managed Care, Other (non HMO) | Source: Ambulatory Visit | Attending: Radiation Oncology | Admitting: Radiation Oncology

## 2010-12-18 DIAGNOSIS — C8590 Non-Hodgkin lymphoma, unspecified, unspecified site: Secondary | ICD-10-CM

## 2010-12-18 NOTE — Progress Notes (Signed)
FIRST [place], [place] GIVEN FOR SKIN CARE

## 2010-12-18 NOTE — Progress Notes (Signed)
Encounter addended by: Maryln Gottron, MD on: 12/18/2010  7:58 PM<BR>     Documentation filed: Notes Section

## 2010-12-18 NOTE — Progress Notes (Signed)
Note that 11-16 cbc shows Hgb of 8.5 g., up from 6.4 on 11-8.  Lab Results  Component Value Date   WBC 16.2* 12/08/2010   HGB 8.5* 12/08/2010   HCT 24.8* 12/08/2010   MCV 96.3 12/08/2010   PLT 137* 12/08/2010

## 2010-12-18 NOTE — Progress Notes (Signed)
Weekly Management Note:  Site:L oropharynx/neck Current Dose:  180  cGy Projected Dose: 4500  cGy  Narrative: The patient is seen today for routine under treatment assessment. CBCT/MVCT images/port films were reviewed. The chart was reviewed.   He is without complaints today. Simulation verification performed today. He was given Biafine cream for anticipated radiation dermatitis.  Physical Examination: There were no vitals filed for this visit..  Weight: 188 lb 1.6 oz (85.322 kg). Oral cavity and oropharynx are unremarkable to inspection. No adenopathy in the neck.  Impression: Tolerating radiation therapy well.  Plan: Continue radiation therapy as planned.

## 2010-12-19 ENCOUNTER — Ambulatory Visit
Admission: RE | Admit: 2010-12-19 | Discharge: 2010-12-19 | Disposition: A | Discharge status: Home / Self Care | Payer: Managed Care, Other (non HMO) | Source: Ambulatory Visit | Attending: Radiation Oncology | Admitting: Radiation Oncology

## 2010-12-19 NOTE — Procedures (Signed)
[name] underwent simulation verification today for treatment to his left oropharynx/neck.  His isocenter is in good position and the multileaf collimators contour the treatment volume appropriately.    ______________________________ Maryln Gottron, M.D. RJM/MEDQ  D:  12/18/2010  T:  12/19/2010  Job:  [id number]

## 2010-12-20 ENCOUNTER — Ambulatory Visit
Admission: RE | Admit: 2010-12-20 | Discharge: 2010-12-20 | Disposition: A | Discharge status: Home / Self Care | Payer: Managed Care, Other (non HMO) | Source: Ambulatory Visit | Attending: Radiation Oncology | Admitting: Radiation Oncology

## 2010-12-21 ENCOUNTER — Other Ambulatory Visit (HOSPITAL_BASED_OUTPATIENT_CLINIC_OR_DEPARTMENT_OTHER): Payer: Managed Care, Other (non HMO) | Admitting: Lab

## 2010-12-21 ENCOUNTER — Ambulatory Visit
Admission: RE | Admit: 2010-12-21 | Discharge: 2010-12-21 | Disposition: A | Discharge status: Home / Self Care | Payer: Managed Care, Other (non HMO) | Source: Ambulatory Visit | Attending: Radiation Oncology | Admitting: Radiation Oncology

## 2010-12-21 DIAGNOSIS — C8589 Other specified types of non-Hodgkin lymphoma, extranodal and solid organ sites: Secondary | ICD-10-CM

## 2010-12-21 LAB — CBC WITH DIFFERENTIAL/PLATELET
BASO%: 3 % — ABNORMAL HIGH (ref 0.0–2.0)
Basophils Absolute: 0.1 10*3/uL (ref 0.0–0.1)
EOS%: 0.6 % (ref 0.0–7.0)
HCT: 34.7 % — ABNORMAL LOW (ref 38.4–49.9)
HGB: 11.5 g/dL — ABNORMAL LOW (ref 13.0–17.1)
MCH: 32.4 pg (ref 27.2–33.4)
MCHC: 33.1 g/dL (ref 32.0–36.0)
MCV: 97.7 fL (ref 79.3–98.0)
MONO%: 14.2 % — ABNORMAL HIGH (ref 0.0–14.0)
NEUT%: 64 % (ref 39.0–75.0)

## 2010-12-22 ENCOUNTER — Telehealth: Payer: Self-pay | Admitting: Oncology

## 2010-12-22 ENCOUNTER — Ambulatory Visit
Admission: RE | Admit: 2010-12-22 | Discharge: 2010-12-22 | Disposition: A | Discharge status: Home / Self Care | Payer: Managed Care, Other (non HMO) | Source: Ambulatory Visit | Attending: Radiation Oncology | Admitting: Radiation Oncology

## 2010-12-22 ENCOUNTER — Other Ambulatory Visit: Payer: Self-pay | Admitting: *Deleted

## 2010-12-22 DIAGNOSIS — C8590 Non-Hodgkin lymphoma, unspecified, unspecified site: Secondary | ICD-10-CM

## 2010-12-22 DIAGNOSIS — C8589 Other specified types of non-Hodgkin lymphoma, extranodal and solid organ sites: Secondary | ICD-10-CM

## 2010-12-22 NOTE — Telephone Encounter (Signed)
lmonvm advising the pt of his lab appt on 12/25/2010@9 :00am

## 2010-12-22 NOTE — Progress Notes (Signed)
Patient requests lab on 12/3 or 12/4

## 2010-12-25 ENCOUNTER — Ambulatory Visit
Admission: RE | Admit: 2010-12-25 | Discharge: 2010-12-25 | Payer: Self-pay | Source: Ambulatory Visit | Attending: Radiation Oncology | Admitting: Radiation Oncology

## 2010-12-25 ENCOUNTER — Ambulatory Visit
Admission: RE | Admit: 2010-12-25 | Discharge: 2010-12-25 | Disposition: A | Discharge status: Home / Self Care | Payer: Managed Care, Other (non HMO) | Source: Ambulatory Visit | Attending: Radiation Oncology | Admitting: Radiation Oncology

## 2010-12-25 ENCOUNTER — Other Ambulatory Visit (HOSPITAL_BASED_OUTPATIENT_CLINIC_OR_DEPARTMENT_OTHER): Payer: Managed Care, Other (non HMO)

## 2010-12-25 DIAGNOSIS — C8589 Other specified types of non-Hodgkin lymphoma, extranodal and solid organ sites: Secondary | ICD-10-CM

## 2010-12-25 DIAGNOSIS — C8590 Non-Hodgkin lymphoma, unspecified, unspecified site: Secondary | ICD-10-CM

## 2010-12-25 LAB — CBC WITH DIFFERENTIAL/PLATELET
BASO%: 2.2 % — ABNORMAL HIGH (ref 0.0–2.0)
EOS%: 1.2 % (ref 0.0–7.0)
Eosinophils Absolute: 0 10*3/uL (ref 0.0–0.5)
LYMPH%: 19 % (ref 14.0–49.0)
MCH: 31.9 pg (ref 27.2–33.4)
MCHC: 33.3 g/dL (ref 32.0–36.0)
MCV: 95.7 fL (ref 79.3–98.0)
MONO%: 14 % (ref 0.0–14.0)
Platelets: 225 10*3/uL (ref 140–400)
RBC: 3.7 10*6/uL — ABNORMAL LOW (ref 4.20–5.82)
RDW: 16 % — ABNORMAL HIGH (ref 11.0–14.6)
nRBC: 0 % (ref 0–0)

## 2010-12-25 NOTE — Progress Notes (Signed)
Swallowing ok, does have some mild dry mouth.

## 2010-12-25 NOTE — Progress Notes (Signed)
Weekly Management Note:  Site:L tonsil Current Dose:  1080  cGy Projected Dose: 4500  cGy  Narrative: The patient is seen today for routine under treatment assessment. CBCT/MVCT images/port films were reviewed. The chart was reviewed.   No complaints today. Mouth slightly dry.  Physical Examination: There were no vitals filed for this visit..  Weight:  . Neck: no masses. Oral cavity and orophayrnx clear.  Impression: Tolerating radiation therapy well.  Plan: Continue radiation therapy as planned.

## 2010-12-26 ENCOUNTER — Ambulatory Visit
Admission: RE | Admit: 2010-12-26 | Discharge: 2010-12-26 | Disposition: A | Discharge status: Home / Self Care | Payer: Managed Care, Other (non HMO) | Source: Ambulatory Visit | Attending: Radiation Oncology | Admitting: Radiation Oncology

## 2010-12-27 ENCOUNTER — Ambulatory Visit
Admission: RE | Admit: 2010-12-27 | Discharge: 2010-12-27 | Disposition: A | Discharge status: Home / Self Care | Payer: Managed Care, Other (non HMO) | Source: Ambulatory Visit | Attending: Radiation Oncology | Admitting: Radiation Oncology

## 2010-12-28 ENCOUNTER — Ambulatory Visit
Admission: RE | Admit: 2010-12-28 | Discharge: 2010-12-28 | Disposition: A | Discharge status: Home / Self Care | Payer: Managed Care, Other (non HMO) | Source: Ambulatory Visit | Attending: Radiation Oncology | Admitting: Radiation Oncology

## 2010-12-29 ENCOUNTER — Ambulatory Visit
Admission: RE | Admit: 2010-12-29 | Discharge: 2010-12-29 | Disposition: A | Discharge status: Home / Self Care | Payer: Managed Care, Other (non HMO) | Source: Ambulatory Visit | Attending: Radiation Oncology | Admitting: Radiation Oncology

## 2011-01-01 ENCOUNTER — Ambulatory Visit
Admission: RE | Admit: 2011-01-01 | Discharge: 2011-01-01 | Disposition: A | Discharge status: Home / Self Care | Payer: Managed Care, Other (non HMO) | Source: Ambulatory Visit | Attending: Radiation Oncology | Admitting: Radiation Oncology

## 2011-01-01 VITALS — Wt 190.1 lb

## 2011-01-01 DIAGNOSIS — C8590 Non-Hodgkin lymphoma, unspecified, unspecified site: Secondary | ICD-10-CM

## 2011-01-01 NOTE — Progress Notes (Signed)
Weekly Management Note:  Site:L tonsil Current Dose:  1980  cGy Projected Dose: 4500  cGy  Narrative: The patient is seen today for routine under treatment assessment. CBCT/MVCT images/port films were reviewed. The chart was reviewed.   He does report a slight change in his taste along with a mild sore throat (2/10). His CBC from December 3 essentially unchanged compared to November 29.  Physical Examination: There were no vitals filed for this visit..  Weight: 190 lb 1.6 oz (86.229 kg). There is no palpable adenopathy in the neck. There are no significant skin changes. Oropharynx probable for slight mucositis along his left tonsil.  Impression: Tolerating radiation therapy well.  Plan: Continue radiation therapy as planned.

## 2011-01-01 NOTE — Progress Notes (Signed)
C/O PAIN 2/10, SORE THROAT ALSO SAYS HIS TASTE IS GETTING LESS, CAN'T TASTE SALTY FOOD. SKIN LOOKS GOOD

## 2011-01-02 ENCOUNTER — Ambulatory Visit (HOSPITAL_COMMUNITY): Payer: Medicaid - Dental | Admitting: Dentistry

## 2011-01-02 ENCOUNTER — Ambulatory Visit
Admission: RE | Admit: 2011-01-02 | Discharge: 2011-01-02 | Disposition: A | Discharge status: Home / Self Care | Payer: Managed Care, Other (non HMO) | Source: Ambulatory Visit | Attending: Radiation Oncology | Admitting: Radiation Oncology

## 2011-01-02 ENCOUNTER — Telehealth: Payer: Self-pay | Admitting: *Deleted

## 2011-01-02 VITALS — BP 121/78 | HR 70 | Temp 97.7°F

## 2011-01-02 DIAGNOSIS — K117 Disturbances of salivary secretion: Secondary | ICD-10-CM

## 2011-01-02 DIAGNOSIS — K121 Other forms of stomatitis: Secondary | ICD-10-CM

## 2011-01-02 DIAGNOSIS — K1230 Oral mucositis (ulcerative), unspecified: Secondary | ICD-10-CM

## 2011-01-02 DIAGNOSIS — R12 Heartburn: Secondary | ICD-10-CM

## 2011-01-02 DIAGNOSIS — K1233 Oral mucositis (ulcerative) due to radiation: Secondary | ICD-10-CM

## 2011-01-02 DIAGNOSIS — R1310 Dysphagia, unspecified: Secondary | ICD-10-CM

## 2011-01-02 DIAGNOSIS — R432 Parageusia: Secondary | ICD-10-CM

## 2011-01-02 NOTE — Telephone Encounter (Signed)
Patient left message that his son has been diagnosed with type A flu today. Does he need to be treated? Tamiflu? No per Dr. Truett Perna. Follow usual contact precautions.

## 2011-01-02 NOTE — Progress Notes (Signed)
"----------  Tuesday, January 02, 2011 at 8:36:32 AM----------" BP: 121/78     P: 70 T: 97.7            Wgt: 150 lbs. Weight is stable. [name] is a 50 yo male referred by Dr. Dayton Scrape for a Pre-Chemoradiation Therapy Dental Consultation for recently diagnosed Recurrent Non-Hodgkin's Lymphoma of the Left Tonsil. [name] now presents for periodic oral examination during radiation therapy. Patient has completed 11 of 25 treatments.   REVIEW OF CHIEF COMPLAINTS: DRY MOUTH: Yes HARD TO SWALLOW: Yes.  HURT TO SWALLOW: Yes.  TASTE CHANGES: Taste is going away.  SORES IN MOUTH: No TRISMUS: Having no problems with trismus. SYMPTOM RELIEF:  Using Biotene Rinses. HOME OH REGIMEN:  BRUSHING: 4X a day FLOSSING: 2X a day RINSING: see above FLUORIDE: Doing fluoride at night having no problems TRISMUS EXERCISES: MIO:  55 mm Patient to start doing trismus exercises at this time. NEW RX'S: No  DENTAL EXAM: Oral hygiene H (PLAQUE): Minimal plaque noted. Patient has good oral hygiene. LOCATION OF MUCOSITIS: Back of throat is erythematous. No ulcerations noted. DESCRIPTION OF SALIVA: Decreased. Moderate xerostomia noted. ANY EXPOSED BONE: None noted. OTHER WATCHED AREAS: None. DX:   1. Xerostomia  2. Dysgeusia 3. Dysphagia 4. Odynophagia 5. Mucositis  RECOMMENDATIONS: 1. Brush after meals and at bedtime. Use fluoride at bedtime. 2. Use trismus exercises as directed. 3. Use Biotene Rinse or salt water/baking soda rinses. 4. Multiple sips of water as needed. 5. RTC in two months for periodic oral exam S/P radiation therapy. Call if problems before then. Dr. Kristin Bruins

## 2011-01-03 ENCOUNTER — Ambulatory Visit
Admission: RE | Admit: 2011-01-03 | Discharge: 2011-01-03 | Disposition: A | Discharge status: Home / Self Care | Payer: Managed Care, Other (non HMO) | Source: Ambulatory Visit | Attending: Radiation Oncology | Admitting: Radiation Oncology

## 2011-01-04 ENCOUNTER — Ambulatory Visit
Admission: RE | Admit: 2011-01-04 | Discharge: 2011-01-04 | Disposition: A | Discharge status: Home / Self Care | Payer: Managed Care, Other (non HMO) | Source: Ambulatory Visit | Attending: Radiation Oncology | Admitting: Radiation Oncology

## 2011-01-05 ENCOUNTER — Ambulatory Visit
Admission: RE | Admit: 2011-01-05 | Discharge: 2011-01-05 | Disposition: A | Discharge status: Home / Self Care | Payer: Managed Care, Other (non HMO) | Source: Ambulatory Visit | Attending: Radiation Oncology | Admitting: Radiation Oncology

## 2011-01-08 ENCOUNTER — Other Ambulatory Visit: Payer: Self-pay | Admitting: Radiation Oncology

## 2011-01-08 ENCOUNTER — Ambulatory Visit
Admission: RE | Admit: 2011-01-08 | Discharge: 2011-01-08 | Disposition: A | Discharge status: Home / Self Care | Payer: Managed Care, Other (non HMO) | Source: Ambulatory Visit | Attending: Radiation Oncology | Admitting: Radiation Oncology

## 2011-01-08 ENCOUNTER — Telehealth: Payer: Self-pay | Admitting: Nutrition

## 2011-01-08 VITALS — Wt 187.6 lb

## 2011-01-08 DIAGNOSIS — C8590 Non-Hodgkin lymphoma, unspecified, unspecified site: Secondary | ICD-10-CM

## 2011-01-08 NOTE — Progress Notes (Signed)
Weekly Management Note:  Site:L tonsil/neck Current Dose:  2880  cGy Projected Dose: 4500  cGy  Narrative: The patient is seen today for routine under treatment assessment. CBCT/MVCT images/port films were reviewed. The chart was reviewed.   He states that his sense of taste is altered. He would like to meet with our dietitian.  Physical Examination: There were no vitals filed for this visit..  Weight: 187 lb 9.6 oz (85.095 kg). There is no palpable lymphadenopathy in the neck. Oral cavity and oropharynx unremarkable to inspection except for mild to moderate mucositis along his left oropharynx. No Candida.  Impression: Tolerating radiation therapy well. Altered taste as expected.  Plan: Continue radiation therapy as planned. I will set up appointment with nutritionist. One-month followup after completion of radiation therapy.

## 2011-01-08 NOTE — Progress Notes (Signed)
Post sim done, all questions answered, gave pt "Radiation and You" booklet.  Only c/o is "my mouth has battery acid taste in it". Will refer pt to dietician.

## 2011-01-08 NOTE — Progress Notes (Signed)
Encounter addended by: Maryln Gottron, MD on: 01/08/2011 11:24 AM<BR>     Documentation filed: Normajean Glasgow VN

## 2011-01-09 ENCOUNTER — Ambulatory Visit
Admission: RE | Admit: 2011-01-09 | Discharge: 2011-01-09 | Disposition: A | Discharge status: Home / Self Care | Payer: Managed Care, Other (non HMO) | Source: Ambulatory Visit | Attending: Radiation Oncology | Admitting: Radiation Oncology

## 2011-01-09 NOTE — Telephone Encounter (Signed)
I received a request to call patient and schedule a nutrition consult.  I left a message and asked that patient return my call to schedule an appointment.

## 2011-01-10 ENCOUNTER — Ambulatory Visit
Admission: RE | Admit: 2011-01-10 | Discharge: 2011-01-10 | Disposition: A | Discharge status: Home / Self Care | Payer: Managed Care, Other (non HMO) | Source: Ambulatory Visit | Attending: Radiation Oncology | Admitting: Radiation Oncology

## 2011-01-10 ENCOUNTER — Other Ambulatory Visit (HOSPITAL_BASED_OUTPATIENT_CLINIC_OR_DEPARTMENT_OTHER): Payer: Managed Care, Other (non HMO) | Admitting: Lab

## 2011-01-10 ENCOUNTER — Other Ambulatory Visit: Payer: Self-pay | Admitting: Nurse Practitioner

## 2011-01-10 ENCOUNTER — Ambulatory Visit: Payer: Managed Care, Other (non HMO) | Admitting: Nurse Practitioner

## 2011-01-10 VITALS — BP 127/83 | HR 64 | Temp 97.0°F | Ht 68.0 in | Wt 185.8 lb

## 2011-01-10 DIAGNOSIS — C8590 Non-Hodgkin lymphoma, unspecified, unspecified site: Secondary | ICD-10-CM

## 2011-01-10 DIAGNOSIS — C8589 Other specified types of non-Hodgkin lymphoma, extranodal and solid organ sites: Secondary | ICD-10-CM

## 2011-01-10 LAB — CBC WITH DIFFERENTIAL/PLATELET
Basophils Absolute: 0 10*3/uL (ref 0.0–0.1)
Eosinophils Absolute: 0.1 10*3/uL (ref 0.0–0.5)
HCT: 41.1 % (ref 38.4–49.9)
HGB: 14.1 g/dL (ref 13.0–17.1)
LYMPH%: 12.8 % — ABNORMAL LOW (ref 14.0–49.0)
MCV: 91.3 fL (ref 79.3–98.0)
MONO%: 11 % (ref 0.0–14.0)
NEUT#: 2.8 10*3/uL (ref 1.5–6.5)
NEUT%: 72 % (ref 39.0–75.0)
Platelets: 126 10*3/uL — ABNORMAL LOW (ref 140–400)

## 2011-01-10 NOTE — Progress Notes (Signed)
OFFICE PROGRESS NOTE  Interval history:  [name] returns as scheduled. He continues daily radiation. He reports that his mouth is dry. He has mild mouth pain. He notes an alteration in taste. No nausea or vomiting. No fevers.   Objective: Blood pressure 127/83, pulse 64, temperature 97 F (36.1 C), temperature source Oral, height 5\' 8"  (1.727 m), weight 185 lb 12.8 oz (84.278 kg).  Oropharynx is without thrush or ulceration. The left posterior palate is mildly erythematous. No palpable cervical, supraclavicular or axillary lymph nodes. Lungs are clear. No wheezes or rales. Regular cardiac rhythm. Port-A-Cath site is without erythema. Abdomen is soft and nontender. No organomegaly. Extremities are without edema.   Lab Results: Lab Results  Component Value Date   WBC 3.8* 01/10/2011   HGB 14.1 01/10/2011   HCT 41.1 01/10/2011   MCV 91.3 01/10/2011   PLT 126* 01/10/2011       Studies/Results: No results found.  Medications: I have reviewed the patient's current medications.  Assessment/Plan:  1. 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 He entered clinical remission following chemotherapy and remained in remission when he was seen at the cancer center 02/24/2009. 2. 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 Staging PET scan 08/23/2010 with increased FDG activity at the left tonsillar fossa and no additional evidence of lymphoma. He completed 4 cycles of R-ICE with cycle #1 beginning on 09/19/2010 and cycle #4 on 11/21/2010. Repeat head and neck examination by Dr. Pollyann Lee following R-ICE/rituximab showed no residual lymphoma. He began radiation consolidation on 12/18/2010. 3.  History of neutropenia secondary to rituximab, resolved. 4. Anemia secondary  to chemotherapy, status post a red blood cell transfusion 11/30/2010. The hemoglobin has normalized. 5. Mild thrombocytopenia secondary to chemotherapy. 6. Hypertension.  Disposition-[name] will complete the course of radiation on 01/22/2011. He will return for a followup visit with Dr. Truett Lee in 2 months. We are referring him to interventional radiology at [facility] for removal of the Port-A-Cath. He will return for a CBC on 01/30/2011. He will contact the office prior to his next office visit with any problems.  Plan reviewed with Dr. Truett Lee.      Lonna Cobb ANP/GNP-BC

## 2011-01-11 ENCOUNTER — Ambulatory Visit
Admission: RE | Admit: 2011-01-11 | Discharge: 2011-01-11 | Disposition: A | Discharge status: Home / Self Care | Payer: Managed Care, Other (non HMO) | Source: Ambulatory Visit | Attending: Radiation Oncology | Admitting: Radiation Oncology

## 2011-01-11 ENCOUNTER — Telehealth: Payer: Self-pay | Admitting: Oncology

## 2011-01-11 NOTE — Telephone Encounter (Signed)
S/w the pt's wife regardin gthe lab appt prior to the port removal appt on 01/17/2011

## 2011-01-12 ENCOUNTER — Ambulatory Visit
Admission: RE | Admit: 2011-01-12 | Discharge: 2011-01-12 | Disposition: A | Discharge status: Home / Self Care | Payer: Managed Care, Other (non HMO) | Source: Ambulatory Visit | Attending: Radiation Oncology | Admitting: Radiation Oncology

## 2011-01-12 ENCOUNTER — Encounter (HOSPITAL_COMMUNITY): Payer: Self-pay

## 2011-01-15 ENCOUNTER — Ambulatory Visit
Admission: RE | Admit: 2011-01-15 | Discharge: 2011-01-15 | Disposition: A | Discharge status: Home / Self Care | Payer: Managed Care, Other (non HMO) | Source: Ambulatory Visit | Attending: Radiation Oncology | Admitting: Radiation Oncology

## 2011-01-15 ENCOUNTER — Other Ambulatory Visit (HOSPITAL_COMMUNITY): Payer: Self-pay | Admitting: Physician Assistant

## 2011-01-15 VITALS — Wt 185.8 lb

## 2011-01-15 DIAGNOSIS — C8590 Non-Hodgkin lymphoma, unspecified, unspecified site: Secondary | ICD-10-CM

## 2011-01-15 NOTE — Progress Notes (Signed)
EATING OK, BUT HAS CHANGE IN TASTE.

## 2011-01-15 NOTE — Progress Notes (Signed)
   Weekly Management Note Current Dose:  3780 cGy  Projected Dose:  4500 cGy   Narrative:  The patient presents for routine under treatment assessment.  CBCT/MVCT images/Port film x-rays were reviewed.  The chart was checked. He has noted poor taste in his mouth as well as skin irritation thus far. He also has some irritation in his mouth and throat. It is managed adequately by taking ibuprofen.  Physical Findings: Weight: 185 lb 12.8 oz (84.278 kg). He is in no acute distress. He has erythema but no desquamation thus far per his skin (neck). He does have erythema noted in the oral mucosa, within the radiation fields. No thrush.    CBC    Component Value Date/Time   WBC 3.8* 01/10/2011 0920   WBC 5.2 09/19/2010 1200   RBC 4.50 01/10/2011 0920   RBC 4.81 09/19/2010 1200   HGB 14.1 01/10/2011 0920   HGB 14.6 09/19/2010 1200   HCT 41.1 01/10/2011 0920   HCT 40.7 09/19/2010 1200   PLT 126* 01/10/2011 0920   PLT 177 09/19/2010 1200   MCV 91.3 01/10/2011 0920   MCV 84.6 09/19/2010 1200   MCH 31.3 01/10/2011 0920   MCH 30.4 09/19/2010 1200   MCHC 34.3 01/10/2011 0920   MCHC 35.9 09/19/2010 1200   RDW 13.1 01/10/2011 0920   RDW 12.6 09/19/2010 1200   LYMPHSABS 0.5* 01/10/2011 0920   LYMPHSABS 1.1 09/19/2010 1200   MONOABS 0.4 01/10/2011 0920   MONOABS 0.6 09/19/2010 1200   EOSABS 0.1 01/10/2011 0920   EOSABS 0.0 09/19/2010 1200   BASOSABS 0.0 01/10/2011 0920   BASOSABS 0.0 09/19/2010 1200    CMP     Component Value Date/Time   NA 138 11/21/2010 1108   K 4.1 11/21/2010 1108   CL 102 11/21/2010 1108   CO2 27 11/21/2010 1108   GLUCOSE 124* 11/21/2010 1108   BUN 13 11/21/2010 1108   CREATININE 0.79 11/21/2010 1108   CALCIUM 9.3 11/21/2010 1108   PROT 6.8 11/21/2010 1108   ALBUMIN 4.0 11/21/2010 1108   AST 38* 11/21/2010 1108   ALT 57* 11/21/2010 1108   ALKPHOS 45 11/21/2010 1108   BILITOT 0.6 11/21/2010 1108   GFRNONAA >90 11/21/2010 1108   GFRAA >90 11/21/2010 1108     Impression:  The patient is tolerating radiotherapy. Plan:  Continue radiotherapy as planned.

## 2011-01-17 ENCOUNTER — Ambulatory Visit (HOSPITAL_COMMUNITY)
Admission: RE | Admit: 2011-01-17 | Discharge: 2011-01-17 | Disposition: A | Discharge status: Home / Self Care | Payer: Managed Care, Other (non HMO) | Source: Ambulatory Visit | Attending: Nurse Practitioner | Admitting: Nurse Practitioner

## 2011-01-17 ENCOUNTER — Encounter (HOSPITAL_COMMUNITY): Payer: Self-pay | Admitting: Pharmacy Technician

## 2011-01-17 ENCOUNTER — Other Ambulatory Visit: Payer: Managed Care, Other (non HMO) | Admitting: Lab

## 2011-01-17 ENCOUNTER — Ambulatory Visit
Admission: RE | Admit: 2011-01-17 | Discharge: 2011-01-17 | Disposition: A | Discharge status: Home / Self Care | Payer: Managed Care, Other (non HMO) | Source: Ambulatory Visit | Attending: Radiation Oncology | Admitting: Radiation Oncology

## 2011-01-17 VITALS — BP 131/80 | HR 75 | Temp 98.5°F | Resp 28

## 2011-01-17 DIAGNOSIS — I10 Essential (primary) hypertension: Secondary | ICD-10-CM | POA: Insufficient documentation

## 2011-01-17 DIAGNOSIS — C8589 Other specified types of non-Hodgkin lymphoma, extranodal and solid organ sites: Secondary | ICD-10-CM | POA: Insufficient documentation

## 2011-01-17 DIAGNOSIS — Z87891 Personal history of nicotine dependence: Secondary | ICD-10-CM | POA: Insufficient documentation

## 2011-01-17 DIAGNOSIS — Z452 Encounter for adjustment and management of vascular access device: Secondary | ICD-10-CM | POA: Insufficient documentation

## 2011-01-17 DIAGNOSIS — C8590 Non-Hodgkin lymphoma, unspecified, unspecified site: Secondary | ICD-10-CM

## 2011-01-17 LAB — CBC
HCT: 40 % (ref 39.0–52.0)
Hemoglobin: 13.8 g/dL (ref 13.0–17.0)
MCH: 31.4 pg (ref 26.0–34.0)
MCV: 91.1 fL (ref 78.0–100.0)
RBC: 4.39 MIL/uL (ref 4.22–5.81)

## 2011-01-17 MED ORDER — MIDAZOLAM HCL 2 MG/2ML IJ SOLN
INTRAMUSCULAR | Status: AC
  Filled 2011-01-17: qty 2

## 2011-01-17 MED ORDER — SODIUM CHLORIDE 0.9 % IV SOLN: INTRAVENOUS | Status: DC

## 2011-01-17 MED ORDER — LIDOCAINE HCL 1 % IJ SOLN
INTRAMUSCULAR | Status: AC
  Filled 2011-01-17: qty 20

## 2011-01-17 MED ORDER — FENTANYL CITRATE 0.05 MG/ML IJ SOLN
INTRAMUSCULAR | Status: AC
  Filled 2011-01-17: qty 2

## 2011-01-17 MED ORDER — VANCOMYCIN HCL IN DEXTROSE 1-5 GM/200ML-% IV SOLN
1000.0000 mg | Freq: Once | INTRAVENOUS | Status: DC
  Filled 2011-01-17: qty 200

## 2011-01-17 MED ORDER — FENTANYL CITRATE 0.05 MG/ML IJ SOLN
INTRAMUSCULAR | Status: AC | PRN
  Administered 2011-01-17 (×2): 100 ug via INTRAVENOUS

## 2011-01-17 MED ORDER — MIDAZOLAM HCL 5 MG/5ML IJ SOLN
INTRAMUSCULAR | Status: AC | PRN
  Administered 2011-01-17 (×2): 2 mg via INTRAVENOUS

## 2011-01-17 NOTE — Procedures (Signed)
RIJV PAC removal No comp 

## 2011-01-17 NOTE — H&P (Signed)
[name] is an 50 y.o. male.   Chief Complaint: " I'm here to get my port removed" HPI: Patient with history of NHL and completed chemotherapy presents today for port a cath removal.  Past Medical History  Diagnosis Date  . Non Hodgkin's lymphoma     s/p 4 cycles R-ICE, start 11/21/10  . Hypertension   . Neutropenia     Secondary to Rituxan    Past Surgical History  Procedure Date  . Portacath placement 09/19/10  . Tonsillectomy     Bilateral  . Biopsy pharynx     09/20/10 excision right posterior pharynx  . Bone marrow aspiration      09/08/10 Biopsy , and clot, left iliac crest    No family history on file. Social History:  reports that he quit smoking about 20 years ago. His smoking use included Cigarettes. He has a 2.5 pack-year smoking history. He does not have any smokeless tobacco history on file. He reports that he does not drink alcohol or use illicit drugs.  Allergies:  Allergies  Allergen Reactions  . Penicillins Other (See Comments)    Childhood allergy reaction unknown     Medications Prior to Admission  Medication Sig Dispense Refill  . emollient (BIAFINE) cream Apply 1 application topically 2 (two) times daily.        Marland Kitchen ibuprofen (ADVIL,MOTRIN) 200 MG tablet Take 200 mg by mouth every 12 (twelve) hours as needed. For pain       Medications Prior to Admission  Medication Dose Route Frequency Provider Last Rate Last Dose  . 0.9 %  sodium chloride infusion   Intravenous Continuous Anselm Pancoast, PA      . heparin flush 10 UNIT/ML injection 10 Units  10 Units Intravenous Once Lucile Shutters, MD      . sodium chloride 0.9 % injection 10 mL  10 mL Intravenous PRN Lucile Shutters, MD      . vancomycin (VANCOCIN) IVPB 1000 mg/200 mL premix  1,000 mg Intravenous Once Anselm Pancoast, PA        No results found for this or any previous visit (from the past 48 hour(s)). No results found.  Review of Systems  Constitutional: Negative for fever  and chills.  HENT: Positive for sore throat.   Respiratory: Negative for cough and shortness of breath.   Cardiovascular: Negative for chest pain.  Gastrointestinal: Negative for nausea, vomiting and abdominal pain.  Neurological: Negative for headaches.  Endo/Heme/Allergies: Does not bruise/bleed easily.    Temperature 98.5 F (36.9 C). BP 148/80  HR 78  R 21  O2 SATS 99% RA   Physical Exam  Constitutional: He is oriented to person, place, and time. He appears well-developed and well-nourished.  Cardiovascular: Normal rate and regular rhythm.   Respiratory: Effort normal and breath sounds normal.  GI: Soft. Bowel sounds are normal. He exhibits no distension. There is no tenderness.  Musculoskeletal: Normal range of motion. He exhibits no edema.  Neurological: He is alert and oriented to person, place, and time.     Assessment/Plan Patient with NHL and completion of chemotherapy; plan is for port a cath removal.  ALLRED,D KEVIN 01/17/2011, 12:17 PM

## 2011-01-17 NOTE — ED Notes (Signed)
Patient denies pain and is resting comfortably.  

## 2011-01-18 ENCOUNTER — Ambulatory Visit
Admission: RE | Admit: 2011-01-18 | Discharge: 2011-01-18 | Disposition: A | Discharge status: Home / Self Care | Payer: Managed Care, Other (non HMO) | Source: Ambulatory Visit | Attending: Radiation Oncology | Admitting: Radiation Oncology

## 2011-01-18 MED FILL — Fentanyl Citrate Inj 0.05 MG/ML: INTRAMUSCULAR | Qty: 1 | Status: AC

## 2011-01-18 MED FILL — Midazolam HCl Inj 5 MG/5ML (Base Equivalent): INTRAMUSCULAR | Qty: 5 | Status: AC

## 2011-01-19 ENCOUNTER — Ambulatory Visit
Admission: RE | Admit: 2011-01-19 | Discharge: 2011-01-19 | Disposition: A | Discharge status: Home / Self Care | Payer: Managed Care, Other (non HMO) | Source: Ambulatory Visit | Attending: Radiation Oncology | Admitting: Radiation Oncology

## 2011-01-22 ENCOUNTER — Ambulatory Visit
Admission: RE | Admit: 2011-01-22 | Discharge: 2011-01-22 | Disposition: A | Discharge status: Home / Self Care | Payer: Managed Care, Other (non HMO) | Source: Ambulatory Visit | Attending: Radiation Oncology | Admitting: Radiation Oncology

## 2011-01-22 ENCOUNTER — Encounter: Payer: Self-pay | Admitting: Radiation Oncology

## 2011-01-22 VITALS — BP 134/87 | HR 88 | Wt 179.2 lb

## 2011-01-22 DIAGNOSIS — C8590 Non-Hodgkin lymphoma, unspecified, unspecified site: Secondary | ICD-10-CM

## 2011-01-22 MED ORDER — BIAFINE EX EMUL
Freq: Every day | CUTANEOUS | Status: DC
  Administered 2011-01-22: 11:00:00 via TOPICAL

## 2011-01-22 NOTE — Progress Notes (Signed)
Patient presents to the clinic today accompanied by his wife and daughter following final xrt. Patient is alert and oriented to person, place, and time. No distress noted. Steady gait noted. Pleasant affect noted. Patient has no complaints at this time. Patient denies pain but, reports a mild "sore throat." Provided patient with additional tube of Biafine and encouraged to use for at least the next two weeks twice daily. Instructed patient treated area will be more sensitive to sunlight. Encouraged patient to use 35 or greater SPF while in sunlight. Patient verbalized understanding. Patient reports taste changes that he is managing with the help of his daughter Designer, jewellery) guidance less alkaline foods. Provided patient with a card for a one month follow up. Five pound weight loss noted since 01/17/11.

## 2011-01-22 NOTE — Progress Notes (Signed)
[facility] Radiation Oncology  Name:[name]  Date:01/22/2011           ZOX:[id number] DOB:09-01-1960   Status:outpatient    CC: Lillia Mountain, MD, MD    REFERRING PHYSICIAN: SHERRILL, G. BRAD, MD     DIAGNOSIS: Recurrent non-Hodgkin's lymphoma, stage IBE   INDICATION FOR TREATMENT: Curative (post chemotherapy)   TREATMENT DATES: 12/18/2010 through 01/22/2011                          SITE/DOSE: Left tonsil/neck 4500 cGy 25 sessions                            BEAMS/ENERGY:  6 MV photons LAO and LPO fields (wedged pair)                 NARRATIVE:  The patient tolerated his radiation therapy recently well although he had alteration of his sense of taste and throat discomfort as expected. He also had development of mild to moderate xerostomia. He took ibuprofen when necessary throat discomfort.                          PLAN: Routine followup in one month. Patient instructed to call if questions or worsening complaints in interim.

## 2011-01-22 NOTE — Progress Notes (Signed)
Weekly Management Note:  Site:L tonsil Current Dose:  4500  cGy Projected Dose: 4500  cGy  Narrative: The patient is seen today for routine under treatment assessment. CBCT/MVCT images/port films were reviewed. The chart was reviewed.   He does have some throat discomfort as expected. He has been using Biafine cream along his left neck and face.  Physical Examination:  Filed Vitals:   01/22/11 1017  BP: 134/87  Pulse: 88  .  Weight: 179 lb 3.2 oz (81.285 kg). No change. There is no candidiasis.  Impression: Satisfactory progress.  Plan: Radiation therapy completed today. One-month followup.

## 2011-01-24 ENCOUNTER — Telehealth: Payer: Self-pay | Admitting: Radiation Oncology

## 2011-01-24 NOTE — Telephone Encounter (Signed)
Received call from this patient concerned about a painful solitary lesion under the base of his tongue. Patient completed radiation treatment 01/22/2011 to left tonsil and chemotherapy in October. Patient denies multiple creamy white lesions, cottony feeling in mouth, or further taste changes therefore I do not feel he has thrush. Patient reports using Biotene regularly and fluride trays with solution as prescribed. He is allergic to penicillin. He only take Motrin prn and Biafine as directed by [place]. Informed patient this writer will report this finding to Dr. Dayton Scrape and call him back with further directions. Patient verbalized understanding.

## 2011-01-25 ENCOUNTER — Telehealth: Payer: Self-pay | Admitting: Radiation Oncology

## 2011-01-25 NOTE — Telephone Encounter (Signed)
Called patient this morning to confirm follow up appointment for Monday, January 7th at 0800 per Dr. Rennie Plowman order. Patient verbalized he would be present.

## 2011-01-29 ENCOUNTER — Ambulatory Visit
Admission: RE | Admit: 2011-01-29 | Discharge: 2011-01-29 | Disposition: A | Discharge status: Home / Self Care | Payer: Managed Care, Other (non HMO) | Source: Ambulatory Visit | Attending: Radiation Oncology | Admitting: Radiation Oncology

## 2011-01-29 VITALS — BP 128/82 | HR 71 | Temp 99.4°F | Resp 20 | Wt 181.0 lb

## 2011-01-29 DIAGNOSIS — C8590 Non-Hodgkin lymphoma, unspecified, unspecified site: Secondary | ICD-10-CM

## 2011-01-29 NOTE — Progress Notes (Signed)
Pt states he no longer has "acute soreness at back of tongue on L side". He states he has not taken any Motrin. He states he does still have general soreness in that area that he's had since tx.  Pt states "appetite good but taste buds not back". No indication of thrush noted in mouth, tongue.

## 2011-01-29 NOTE — Progress Notes (Signed)
Followup note:  [name] visits today approximately one week following completion of radiation therapy to his left oropharynx/neck in the management of his non-Hodgkin's lymphoma. Late last week he was having more through discomfort in one to be seen. Over the weekend his throat discomfort improved although he does have generalized discomfort along his left neck. He is otherwise doing well. His taste remains diminished.  Physical examination: There is residual erythema along left neck. There is slight fullness but no adenopathy. Oral cavity and oropharynx remarkable for mild xerostomia and residual mucositis along his left tonsil/oropharynx. No candidiasis.  Impression: Probably persistent mucositis which has since resolved.  Plan: He'll keep his followup visit with me for late January.

## 2011-01-30 ENCOUNTER — Other Ambulatory Visit (HOSPITAL_BASED_OUTPATIENT_CLINIC_OR_DEPARTMENT_OTHER): Payer: Managed Care, Other (non HMO) | Admitting: Lab

## 2011-01-30 DIAGNOSIS — C8590 Non-Hodgkin lymphoma, unspecified, unspecified site: Secondary | ICD-10-CM

## 2011-01-30 DIAGNOSIS — C8589 Other specified types of non-Hodgkin lymphoma, extranodal and solid organ sites: Secondary | ICD-10-CM

## 2011-01-30 LAB — CBC WITH DIFFERENTIAL/PLATELET
Basophils Absolute: 0 10*3/uL (ref 0.0–0.1)
Eosinophils Absolute: 0.1 10*3/uL (ref 0.0–0.5)
HCT: 40.1 % (ref 38.4–49.9)
HGB: 14 g/dL (ref 13.0–17.1)
LYMPH%: 20.3 % (ref 14.0–49.0)
MCV: 88.5 fL (ref 79.3–98.0)
MONO#: 0.3 10*3/uL (ref 0.1–0.9)
MONO%: 12.6 % (ref 0.0–14.0)
NEUT#: 1.6 10*3/uL (ref 1.5–6.5)
NEUT%: 63.1 % (ref 39.0–75.0)
Platelets: 144 10*3/uL (ref 140–400)
RBC: 4.53 10*6/uL (ref 4.20–5.82)
RDW: 12.5 % (ref 11.0–14.6)

## 2011-02-02 ENCOUNTER — Telehealth: Payer: Self-pay | Admitting: Oncology

## 2011-02-02 ENCOUNTER — Other Ambulatory Visit: Payer: Self-pay | Admitting: *Deleted

## 2011-02-02 DIAGNOSIS — C8590 Non-Hodgkin lymphoma, unspecified, unspecified site: Secondary | ICD-10-CM

## 2011-02-02 NOTE — Telephone Encounter (Signed)
S/w the pt and he is aware of his lab appt.

## 2011-02-09 ENCOUNTER — Telehealth: Payer: Self-pay | Admitting: *Deleted

## 2011-02-09 ENCOUNTER — Other Ambulatory Visit (HOSPITAL_BASED_OUTPATIENT_CLINIC_OR_DEPARTMENT_OTHER): Payer: Managed Care, Other (non HMO) | Admitting: Lab

## 2011-02-09 DIAGNOSIS — C8590 Non-Hodgkin lymphoma, unspecified, unspecified site: Secondary | ICD-10-CM

## 2011-02-09 DIAGNOSIS — C8589 Other specified types of non-Hodgkin lymphoma, extranodal and solid organ sites: Secondary | ICD-10-CM

## 2011-02-09 LAB — CBC WITH DIFFERENTIAL/PLATELET
BASO%: 0.4 % (ref 0.0–2.0)
Eosinophils Absolute: 0 10*3/uL (ref 0.0–0.5)
HCT: 40.7 % (ref 38.4–49.9)
LYMPH%: 20.1 % (ref 14.0–49.0)
MCHC: 34.6 g/dL (ref 32.0–36.0)
MONO#: 0.4 10*3/uL (ref 0.1–0.9)
NEUT%: 65.6 % (ref 39.0–75.0)
Platelets: 168 10*3/uL (ref 140–400)
WBC: 2.7 10*3/uL — ABNORMAL LOW (ref 4.0–10.3)

## 2011-02-09 NOTE — Telephone Encounter (Signed)
Per Dr Truett Perna, CBC is good and pt can go out of town.  Called and left msg on pt's cell phone with instructions and to call back with any questions/concerns.  SLJ

## 2011-02-27 ENCOUNTER — Ambulatory Visit
Admission: RE | Admit: 2011-02-27 | Discharge: 2011-02-27 | Disposition: A | Discharge status: Home / Self Care | Payer: Managed Care, Other (non HMO) | Source: Ambulatory Visit | Attending: Radiation Oncology | Admitting: Radiation Oncology

## 2011-02-27 ENCOUNTER — Encounter: Payer: Self-pay | Admitting: Radiation Oncology

## 2011-02-27 ENCOUNTER — Encounter (HOSPITAL_COMMUNITY): Payer: Self-pay | Admitting: Dentistry

## 2011-02-27 ENCOUNTER — Ambulatory Visit: Payer: Managed Care, Other (non HMO) | Admitting: Radiation Oncology

## 2011-02-27 ENCOUNTER — Ambulatory Visit (HOSPITAL_COMMUNITY): Payer: Medicaid - Dental | Admitting: Dentistry

## 2011-02-27 VITALS — BP 138/86 | HR 76 | Temp 98.0°F | Resp 18 | Wt 176.4 lb

## 2011-02-27 VITALS — BP 127/73 | HR 73 | Temp 98.6°F | Wt 178.0 lb

## 2011-02-27 DIAGNOSIS — R432 Parageusia: Secondary | ICD-10-CM

## 2011-02-27 DIAGNOSIS — K117 Disturbances of salivary secretion: Secondary | ICD-10-CM

## 2011-02-27 DIAGNOSIS — K036 Deposits [accretions] on teeth: Secondary | ICD-10-CM

## 2011-02-27 DIAGNOSIS — C8590 Non-Hodgkin lymphoma, unspecified, unspecified site: Secondary | ICD-10-CM

## 2011-02-27 NOTE — Progress Notes (Signed)
Followup note:  Mr. Cavenaugh returns today approximately 1 month following completion of consolidative radiation therapy in the management of his recurrent non-Hodgkin's lymphoma involving the left oropharynx. He is without new complaints today. He no longer has any throat discomfort. His taste is returning. His mouth is somewhat dry. His performance status is excellent. No B. symptoms. He saw his dentist this morning and he'll see Dr. Truett Perna for a followup visit next week.  Physical examination: Nodes: Without palpable lymphadenopathy in the neck. Oral cavity and oropharynx remarkable for mild xerostomia. There is no evidence for recurrent disease along his left oropharynx. Indirect mirror examination confirmatory.  Impression: No evidence for recurrent disease.  Plan: She'll see Dr. Truett Perna next week I'll see him back for a followup visit in 3 months.

## 2011-02-27 NOTE — Progress Notes (Signed)
Tuesday, February 27, 2011   BP:127/73      P:  73            T:  98.6           Wgt: 178 lbs now. Patient was 190 lbs at start of treatments.  [name] is a 51 year old male with History or Recurrent Non-Hodgkin's Lymphoma of the Left Tonsil. Patient underwent chemotherapy with Dr. Truett Perna followed by radiation therapy with Dr. Dayton Scrape  from 12/18/10 through 01/22/11. Patient now presents for a periodic oral examination after radiation therapy.    REVIEW OF CHIEF COMPLAINTS: DRY MOUTH:  Not too bad. HARD TO SWALLOW: No HURT TO SWALLOW: No TASTE CHANGES: Slowly coming back SORES IN MOUTH: No TRISMUS SYMPTOMS: Having no problems with trismus SYMPTOM RELIEF:  Using Biotene Rinses. HOME ORAL HYGIENE REGIMEN:  BRUSHING: 2X a day  FLOSSING: 2X a day RINSING: see above FLUORIDE: Doing at night having no problems. TRISMUS EXERCISES: Maximim interincisal opening: 55 mm.   DENTAL EXAM: ORAL HYGIENE(PLAQUE):Very good oral hygiene. LOCATION OF MUCOSITIS: None. DESCRIPTION OF SALIVA: Decreased saliva. Foamy saliva. Mild to moderate xerostomia. ANY EXPOSED BONE: None noted. OTHER WATCHED AREAS: Distal margin of crown on tooth #19. DIAGNOSES: 1.  Xerostomia  2.  Dysgeusia-resolving. 3.  History of weight loss-now stable.  RECOMMENDATIONS: 1. Brush after meals and at bedtime. Use fluoride at bedtime. 2. Use trismus exercises as directed. 3. Use Biotene Rinse or salt water/baking soda rinses. 4. Multiple sips of water as needed. 5. Follow up with Dr. Ninetta Lee for routine dental care. Appointment has been scheduled for March 26, 2011. Call if problems before then.   Dr. Windy Fast F.Linzie Boursiquot

## 2011-02-27 NOTE — Progress Notes (Signed)
Patient presents to the clinic today unaccompanied for a follow up appointment with Dr. Dayton Scrape. Patient is alert and oriented to person, place, and time. No distress noted. Steady gait noted. Pleasant affect noted. Patient denies pain at this time. Patient reports that he is not taking any medication at this time. No sores, ulcers, or thrush noted in the patient's mouth. Patient reports that his taste is coming back. Patient reports that his energy level is not an issue and that he is even traveling for work. Patient reports a "touch" of dry mouth on the left side despite using Biotene. Patient reports that he is able to eat all foods. Patient reports that he saw his dentist this morning. Patient is scheduled to follow up with Dr. Truett Perna on March 05, 2011. Reported all findings to Dr. Dayton Scrape.

## 2011-03-05 ENCOUNTER — Ambulatory Visit (HOSPITAL_BASED_OUTPATIENT_CLINIC_OR_DEPARTMENT_OTHER): Payer: Managed Care, Other (non HMO) | Admitting: Oncology

## 2011-03-05 ENCOUNTER — Other Ambulatory Visit: Payer: Managed Care, Other (non HMO) | Admitting: Lab

## 2011-03-05 ENCOUNTER — Telehealth: Payer: Self-pay | Admitting: Oncology

## 2011-03-05 VITALS — BP 144/95 | HR 63 | Temp 97.9°F | Ht 68.0 in | Wt 175.8 lb

## 2011-03-05 DIAGNOSIS — C8590 Non-Hodgkin lymphoma, unspecified, unspecified site: Secondary | ICD-10-CM

## 2011-03-05 DIAGNOSIS — C8589 Other specified types of non-Hodgkin lymphoma, extranodal and solid organ sites: Secondary | ICD-10-CM

## 2011-03-05 LAB — CBC WITH DIFFERENTIAL/PLATELET
Basophils Absolute: 0 10*3/uL (ref 0.0–0.1)
EOS%: 0.7 % (ref 0.0–7.0)
HCT: 43.1 % (ref 38.4–49.9)
HGB: 15.1 g/dL (ref 13.0–17.1)
LYMPH%: 21.4 % (ref 14.0–49.0)
MCH: 30.1 pg (ref 27.2–33.4)
MCV: 86 fL (ref 79.3–98.0)
MONO%: 11.7 % (ref 0.0–14.0)
NEUT%: 65.5 % (ref 39.0–75.0)

## 2011-03-05 NOTE — Telephone Encounter (Signed)
gv pt appt for [id number] 

## 2011-03-05 NOTE — Progress Notes (Signed)
No flu vaccine this season

## 2011-03-05 NOTE — Progress Notes (Signed)
OFFICE PROGRESS NOTE   INTERVAL HISTORY:   He returns as scheduled. He reports an improved energy level. He has a mild dryness in the mouth. Radiation was completed on 01/22/2011.  Objective:  Vital signs in last 24 hours:  Blood pressure 144/95, pulse 63, temperature 97.9 F (36.6 C), temperature source Oral, height 5\' 8"  (1.727 m), weight 175 lb 12.8 oz (79.742 kg).    HEENT: Oropharynx without visible mass. Neck without mass. Lymphatics: No cervical, supraclavicular, axillary, or inguinal nodes Resp: Lungs clear bilaterally Cardio: Regular rate and rhythm GI: No hepatosplenomegaly Vascular: No leg edema    Portacath/PICC-remaining suture at the right lateral aspect of the previous Port-A-Cath incision.  Lab Results:  Lab Results  Component Value Date   WBC 2.8* 03/05/2011   HGB 15.1 03/05/2011   HCT 43.1 03/05/2011   MCV 86.0 03/05/2011   PLT 162 03/05/2011   ANC 1.8   Medications: I have reviewed the patient's current medications.  Assessment/Plan: 1. 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 He entered clinical remission following chemotherapy and remained in remission when he was seen at the cancer center 02/24/2009. 2. 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 Staging PET scan 08/23/2010 with increased FDG activity at the left tonsillar fossa and no additional evidence of lymphoma. He completed 4 cycles of R-ICE with cycle #1 beginning on 09/19/2010 and cycle #4 on 11/21/2010. Repeat head and neck examination by Dr. Pollyann Kennedy following R-ICE/rituximab showed no residual lymphoma. He began radiation consolidation on 12/18/2010, radiation was completed on 01/22/2011 3. History of neutropenia secondary to rituximab, resolved. 4. Anemia secondary  to chemotherapy, status post a red blood cell transfusion 11/30/2010. The hemoglobin has normalized. 5. Mild thrombocytopenia secondary to chemotherapy-resolved 6. Hypertension       7.   Remaining suture at the right upper chest Port-A-Cath site  Disposition:  He appears to be in clinical remission from the non-Hodgkin's lymphoma. He will return for a CBC in 3 weeks. He is scheduled for an office visit in 4 months. He will contact us in the interim for new symptoms. He does not wish to receive an influenza vaccine.   Lucile Shutters, MD  03/05/2011  4:16 PM

## 2011-03-16 NOTE — Telephone Encounter (Signed)
xxx

## 2011-03-26 ENCOUNTER — Telehealth: Payer: Self-pay | Admitting: *Deleted

## 2011-03-26 ENCOUNTER — Other Ambulatory Visit (HOSPITAL_BASED_OUTPATIENT_CLINIC_OR_DEPARTMENT_OTHER): Payer: Managed Care, Other (non HMO)

## 2011-03-26 ENCOUNTER — Other Ambulatory Visit: Payer: Self-pay | Admitting: *Deleted

## 2011-03-26 DIAGNOSIS — C8590 Non-Hodgkin lymphoma, unspecified, unspecified site: Secondary | ICD-10-CM

## 2011-03-26 DIAGNOSIS — C8589 Other specified types of non-Hodgkin lymphoma, extranodal and solid organ sites: Secondary | ICD-10-CM

## 2011-03-26 LAB — CBC WITH DIFFERENTIAL/PLATELET
BASO%: 0.9 % (ref 0.0–2.0)
LYMPH%: 23.1 % (ref 14.0–49.0)
MCHC: 35.5 g/dL (ref 32.0–36.0)
MCV: 84 fL (ref 79.3–98.0)
MONO%: 27.1 % — ABNORMAL HIGH (ref 0.0–14.0)
Platelets: 160 10*3/uL (ref 140–400)
RBC: 5.06 10*6/uL (ref 4.20–5.82)
nRBC: 0 % (ref 0–0)

## 2011-03-26 NOTE — Telephone Encounter (Signed)
Left VM on cell # that [facility] are lower. Use good handwashing and call for fever or s/s infection. Will recheck in 1 week per Lonna Cobb, NP. POF to scheduler.

## 2011-03-27 ENCOUNTER — Telehealth: Payer: Self-pay | Admitting: Oncology

## 2011-03-27 NOTE — Telephone Encounter (Signed)
lmonvm for pt re appt for 3/11.  °

## 2011-04-02 ENCOUNTER — Other Ambulatory Visit (HOSPITAL_BASED_OUTPATIENT_CLINIC_OR_DEPARTMENT_OTHER): Payer: Managed Care, Other (non HMO) | Admitting: Lab

## 2011-04-02 DIAGNOSIS — C8589 Other specified types of non-Hodgkin lymphoma, extranodal and solid organ sites: Secondary | ICD-10-CM

## 2011-04-02 DIAGNOSIS — C8590 Non-Hodgkin lymphoma, unspecified, unspecified site: Secondary | ICD-10-CM

## 2011-04-02 LAB — CBC WITH DIFFERENTIAL/PLATELET
BASO%: 1.8 % (ref 0.0–2.0)
EOS%: 1.8 % (ref 0.0–7.0)
HCT: 39.3 % (ref 38.4–49.9)
MCH: 29.6 pg (ref 27.2–33.4)
MCHC: 35.9 g/dL (ref 32.0–36.0)
MONO%: 22 % — ABNORMAL HIGH (ref 0.0–14.0)
NEUT%: 38.1 % — ABNORMAL LOW (ref 39.0–75.0)
RDW: 13.1 % (ref 11.0–14.6)
lymph#: 0.6 10*3/uL — ABNORMAL LOW (ref 0.9–3.3)

## 2011-04-05 ENCOUNTER — Telehealth: Payer: Self-pay | Admitting: *Deleted

## 2011-04-05 NOTE — Telephone Encounter (Signed)
Called pt re: CBC from 3/11. Call for fever or sign of infection, neutropenia likely related to Rituxan tx. Check CBC in two weeks, per Dr. Truett Perna. Pt requests to check lab on 3/22 before he goes out of town.  Request sent to schedulers for lab only visit.

## 2011-04-13 ENCOUNTER — Ambulatory Visit (HOSPITAL_BASED_OUTPATIENT_CLINIC_OR_DEPARTMENT_OTHER): Payer: Managed Care, Other (non HMO) | Admitting: Lab

## 2011-04-13 ENCOUNTER — Other Ambulatory Visit: Payer: Self-pay | Admitting: *Deleted

## 2011-04-13 DIAGNOSIS — C8590 Non-Hodgkin lymphoma, unspecified, unspecified site: Secondary | ICD-10-CM

## 2011-04-13 DIAGNOSIS — C8589 Other specified types of non-Hodgkin lymphoma, extranodal and solid organ sites: Secondary | ICD-10-CM

## 2011-04-13 LAB — CBC WITH DIFFERENTIAL/PLATELET
BASO%: 0.8 % (ref 0.0–2.0)
EOS%: 1.9 % (ref 0.0–7.0)
Eosinophils Absolute: 0.1 10*3/uL (ref 0.0–0.5)
MCH: 29.2 pg (ref 27.2–33.4)
MCHC: 35.4 g/dL (ref 32.0–36.0)
MCV: 82.7 fL (ref 79.3–98.0)
MONO%: 21.5 % — ABNORMAL HIGH (ref 0.0–14.0)
NEUT#: 1.3 10*3/uL — ABNORMAL LOW (ref 1.5–6.5)
RBC: 4.96 10*6/uL (ref 4.20–5.82)
RDW: 13.8 % (ref 11.0–14.6)
nRBC: 0 % (ref 0–0)

## 2011-04-17 ENCOUNTER — Other Ambulatory Visit: Payer: Self-pay | Admitting: Oncology

## 2011-04-18 ENCOUNTER — Other Ambulatory Visit: Payer: Self-pay | Admitting: *Deleted

## 2011-04-18 ENCOUNTER — Telehealth: Payer: Self-pay | Admitting: *Deleted

## 2011-04-18 ENCOUNTER — Telehealth: Payer: Self-pay | Admitting: Oncology

## 2011-04-18 DIAGNOSIS — C8590 Non-Hodgkin lymphoma, unspecified, unspecified site: Secondary | ICD-10-CM

## 2011-04-18 NOTE — Telephone Encounter (Signed)
Left message on voicemail that lab looked better, recheck 05/28/11 at 0930 when in office for Dr. Dayton Scrape appt.

## 2011-04-18 NOTE — Telephone Encounter (Signed)
Message copied by Caleb Popp on Wed Apr 18, 2011  3:00 PM ------      Message from: Ladene Artist      Created: Sun Apr 15, 2011  9:06 PM       Please call patient, anc is better, check cbc 6 weeks

## 2011-04-18 NOTE — Telephone Encounter (Signed)
called pt lmovm for lab appt for 05/06

## 2011-05-28 ENCOUNTER — Other Ambulatory Visit: Payer: Self-pay | Admitting: Lab

## 2011-05-28 ENCOUNTER — Ambulatory Visit: Payer: Managed Care, Other (non HMO) | Admitting: Radiation Oncology

## 2011-05-28 ENCOUNTER — Encounter: Payer: Self-pay | Admitting: Radiation Oncology

## 2011-05-28 DIAGNOSIS — Z923 Personal history of irradiation: Secondary | ICD-10-CM | POA: Insufficient documentation

## 2011-05-30 ENCOUNTER — Ambulatory Visit
Admission: RE | Admit: 2011-05-30 | Discharge: 2011-05-30 | Disposition: A | Discharge status: Home / Self Care | Payer: Managed Care, Other (non HMO) | Source: Ambulatory Visit | Attending: Radiation Oncology | Admitting: Radiation Oncology

## 2011-05-30 ENCOUNTER — Encounter: Payer: Self-pay | Admitting: Radiation Oncology

## 2011-05-30 ENCOUNTER — Other Ambulatory Visit (HOSPITAL_BASED_OUTPATIENT_CLINIC_OR_DEPARTMENT_OTHER): Payer: Managed Care, Other (non HMO)

## 2011-05-30 VITALS — BP 118/80 | HR 63 | Temp 98.5°F | Resp 20 | Wt 170.5 lb

## 2011-05-30 DIAGNOSIS — C8590 Non-Hodgkin lymphoma, unspecified, unspecified site: Secondary | ICD-10-CM

## 2011-05-30 DIAGNOSIS — C8589 Other specified types of non-Hodgkin lymphoma, extranodal and solid organ sites: Secondary | ICD-10-CM

## 2011-05-30 LAB — CBC WITH DIFFERENTIAL/PLATELET
BASO%: 0.4 % (ref 0.0–2.0)
EOS%: 2.4 % (ref 0.0–7.0)
MCH: 30.5 pg (ref 27.2–33.4)
MCHC: 35.1 g/dL (ref 32.0–36.0)
MONO#: 0.7 10*3/uL (ref 0.1–0.9)
RBC: 4.79 10*6/uL (ref 4.20–5.82)
RDW: 13.7 % (ref 11.0–14.6)
WBC: 2.5 10*3/uL — ABNORMAL LOW (ref 4.0–10.3)
lymph#: 0.6 10*3/uL — ABNORMAL LOW (ref 0.9–3.3)
nRBC: 0 % (ref 0–0)

## 2011-05-30 NOTE — Progress Notes (Signed)
Pt states his taste is 90% normal, somewhat of a dry mouth but no difficulty eating, swallowing. Pt exercising regularly, verbalized no c/o.

## 2011-05-30 NOTE — Progress Notes (Signed)
Followup note:  The patient returns today just over 4 months following completion of consolidative radiation therapy in the management of his recurrent non-Hodgkin's lymphoma involving the left oropharynx. He is to do well and his xerostomia is markedly improved. His taste is also returned. He denies throat discomfort or pain on swallowing. He denies B. symptoms. He'll see Dr. Truett Perna again on June 17.  Physical examination: Alert and oriented. Wt Readings from Last 3 Encounters:  05/30/11 170 lb 8 oz (77.338 kg)  03/05/11 175 lb 12.8 oz (79.742 kg)  02/27/11 176 lb 6.4 oz (80.015 kg)   Temp Readings from Last 3 Encounters:  05/30/11 98.5 F (36.9 C) Oral  03/05/11 97.9 F (36.6 C) Oral  02/27/11 98 F (36.7 C) Oral   BP Readings from Last 3 Encounters:  05/30/11 118/80  03/05/11 144/95  02/27/11 138/86   Pulse Readings from Last 3 Encounters:  05/30/11 63  03/05/11 63  02/27/11 76   Head and neck examination: Oral cavity and oropharynx are unremarkable to inspection. The mouth is moist and there is no visible or palpable evidence for recurrent disease along his oropharynx. Nodes: There is no palpable cervical, supraclavicular, or axillary lymphadenopathy. Chest: Lungs clear. Heart: Regular in rhythm. Abdomen: Without masses organomegaly. Extremities: Without edema.  Lab Results  Component Value Date   WBC 2.5* 05/30/2011   HGB 14.6 05/30/2011   HCT 41.6 05/30/2011   MCV 86.8 05/30/2011   PLT 177 05/30/2011    Impression: Satisfactory progress with no evidence for recurrent disease.  Plan: He'll maintain his followup with Dr. Truett Perna.

## 2011-05-31 ENCOUNTER — Telehealth: Payer: Self-pay | Admitting: *Deleted

## 2011-05-31 NOTE — Telephone Encounter (Signed)
Message copied by Wandalee Ferdinand on Thu May 31, 2011  1:55 PM ------      Message from: [name]      Created: Wed May 30, 2011  9:41 PM       Please call patient, mild neutropenia-stable, f/u as scheduled

## 2011-05-31 NOTE — Telephone Encounter (Signed)
Left message on voicemail: labs stable, follow up as scheduled.

## 2011-05-31 NOTE — Telephone Encounter (Signed)
Message copied by Caleb Popp on Thu May 31, 2011  5:14 PM ------      Message from: Thornton Papas B      Created: Wed May 30, 2011  9:41 PM       Please call patient, mild neutropenia-stable, f/u as scheduled

## 2011-05-31 NOTE — Telephone Encounter (Signed)
Left lab results on his voice mail and added that they are stable and follow up as scheduled.

## 2011-07-09 ENCOUNTER — Ambulatory Visit (HOSPITAL_BASED_OUTPATIENT_CLINIC_OR_DEPARTMENT_OTHER): Payer: Managed Care, Other (non HMO) | Admitting: Oncology

## 2011-07-09 ENCOUNTER — Telehealth: Payer: Self-pay | Admitting: Oncology

## 2011-07-09 ENCOUNTER — Other Ambulatory Visit (HOSPITAL_BASED_OUTPATIENT_CLINIC_OR_DEPARTMENT_OTHER): Payer: Managed Care, Other (non HMO) | Admitting: Lab

## 2011-07-09 VITALS — BP 117/73 | HR 70 | Temp 98.0°F | Ht 68.0 in | Wt 170.4 lb

## 2011-07-09 DIAGNOSIS — C8589 Other specified types of non-Hodgkin lymphoma, extranodal and solid organ sites: Secondary | ICD-10-CM

## 2011-07-09 DIAGNOSIS — D709 Neutropenia, unspecified: Secondary | ICD-10-CM

## 2011-07-09 DIAGNOSIS — C8590 Non-Hodgkin lymphoma, unspecified, unspecified site: Secondary | ICD-10-CM

## 2011-07-09 LAB — CBC WITH DIFFERENTIAL/PLATELET
Basophils Absolute: 0 10*3/uL (ref 0.0–0.1)
EOS%: 1.6 % (ref 0.0–7.0)
Eosinophils Absolute: 0 10*3/uL (ref 0.0–0.5)
HGB: 14.8 g/dL (ref 13.0–17.1)
MONO%: 22.6 % — ABNORMAL HIGH (ref 0.0–14.0)
NEUT#: 1.1 10*3/uL — ABNORMAL LOW (ref 1.5–6.5)
RBC: 4.87 10*6/uL (ref 4.20–5.82)
RDW: 12.7 % (ref 11.0–14.6)
lymph#: 0.7 10*3/uL — ABNORMAL LOW (ref 0.9–3.3)
nRBC: 0 % (ref 0–0)

## 2011-07-09 NOTE — Progress Notes (Signed)
    [facility]    OFFICE PROGRESS NOTE   INTERVAL HISTORY:   He returns as scheduled. He feels well. No soreness in the throat. No fevers, night sweats, or anorexia. He developed acute low abdominal pain after lifting a refrigerator last week. He is concerned he may have developed a hernia. The pain has improved. He reports an intentional weight loss with exercise.  Objective:  Vital signs in last 24 hours:  Blood pressure 117/73, pulse 70, temperature 98 F (36.7 C), temperature source Oral, height 5\' 8"  (1.727 m), weight 170 lb 6.4 oz (77.293 kg).    HEENT: Oropharynx without visible mass, neck without mass Lymphatics: No cervical, supraclavicular, axillary, or inguinal nodes Resp: Lungs clear bilaterally Cardio: Regular rate and rhythm GI: No hepatosplenomegaly, no mass, nontender, examination of the low abdomen is unremarkable Vascular:  No leg edema   Lab Results:  Lab Results  Component Value Date   WBC 2.4* 07/09/2011   HGB 14.8 07/09/2011   HCT 41.5 07/09/2011   MCV 85.2 07/09/2011   PLT 167 07/09/2011   ANC 1.1   Medications: I have reviewed the patient's current medications.  Assessment/Plan: 1. 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 He entered clinical remission following chemotherapy and remained in remission when he was seen at the cancer center 02/24/2009. 2. 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 Staging PET scan 08/23/2010 with increased FDG activity at the left tonsillar fossa and no additional evidence of lymphoma. He completed 4 cycles of R-ICE with cycle #1 beginning on 09/19/2010 and cycle #4 on 11/21/2010. Repeat head and neck examination by Dr. Pollyann Kennedy following R-ICE/rituximab showed no residual  lymphoma. He began radiation consolidation on 12/18/2010, radiation was completed on 01/22/2011 3. History of neutropenia secondary to rituximab 4. Anemia secondary to chemotherapy, status post a red blood cell transfusion 11/30/2010. The hemoglobin has normalized. 5. Mild thrombocytopenia secondary to chemotherapy-resolved 6. Hypertension        7.  Mild neutropenia-persistent, likely related to chemotherapy-he knows to contact us for symptoms of infection Disposition:  He remains in clinical remission from non-Hodgkin's lymphoma. He will return for an office and lab visit in 3 months.   Thornton Papas, MD  07/09/2011  1:39 PM

## 2011-07-09 NOTE — Telephone Encounter (Signed)
Gave pt appt for September 2013 lab and ML °

## 2011-09-03 ENCOUNTER — Telehealth: Payer: Self-pay | Admitting: Oncology

## 2011-09-03 ENCOUNTER — Telehealth: Payer: Self-pay | Admitting: *Deleted

## 2011-09-03 NOTE — Telephone Encounter (Signed)
Pt called and r/s appt frpm 9/17 to 9/24th, notified RN through VM

## 2011-09-03 NOTE — Telephone Encounter (Signed)
error 

## 2011-10-09 ENCOUNTER — Other Ambulatory Visit: Payer: Self-pay | Admitting: Lab

## 2011-10-09 ENCOUNTER — Ambulatory Visit: Payer: Self-pay | Admitting: Nurse Practitioner

## 2011-10-16 ENCOUNTER — Other Ambulatory Visit (HOSPITAL_BASED_OUTPATIENT_CLINIC_OR_DEPARTMENT_OTHER): Payer: Managed Care, Other (non HMO) | Admitting: Lab

## 2011-10-16 ENCOUNTER — Ambulatory Visit (HOSPITAL_BASED_OUTPATIENT_CLINIC_OR_DEPARTMENT_OTHER): Payer: Managed Care, Other (non HMO) | Admitting: Nurse Practitioner

## 2011-10-16 ENCOUNTER — Telehealth: Payer: Self-pay | Admitting: Oncology

## 2011-10-16 VITALS — BP 139/87 | HR 67 | Temp 98.9°F | Resp 18 | Ht 68.0 in | Wt 175.6 lb

## 2011-10-16 DIAGNOSIS — C8589 Other specified types of non-Hodgkin lymphoma, extranodal and solid organ sites: Secondary | ICD-10-CM

## 2011-10-16 DIAGNOSIS — C8581 Other specified types of non-Hodgkin lymphoma, lymph nodes of head, face, and neck: Secondary | ICD-10-CM

## 2011-10-16 DIAGNOSIS — C8590 Non-Hodgkin lymphoma, unspecified, unspecified site: Secondary | ICD-10-CM

## 2011-10-16 LAB — CBC WITH DIFFERENTIAL/PLATELET
BASO%: 0.7 % (ref 0.0–2.0)
Eosinophils Absolute: 0.1 10*3/uL (ref 0.0–0.5)
HCT: 44.7 % (ref 38.4–49.9)
LYMPH%: 27.2 % (ref 14.0–49.0)
MCHC: 36 g/dL (ref 32.0–36.0)
MONO#: 0.5 10*3/uL (ref 0.1–0.9)
NEUT#: 1.5 10*3/uL (ref 1.5–6.5)
Platelets: 166 10*3/uL (ref 140–400)
RBC: 5.13 10*6/uL (ref 4.20–5.82)
WBC: 2.8 10*3/uL — ABNORMAL LOW (ref 4.0–10.3)
lymph#: 0.8 10*3/uL — ABNORMAL LOW (ref 0.9–3.3)
nRBC: 0 % (ref 0–0)

## 2011-10-16 NOTE — Progress Notes (Signed)
OFFICE PROGRESS NOTE  Interval history:  [name] returns as scheduled. He feels well. He has a good energy level and good appetite. No fevers or sweats. No enlarged lymph nodes. No shortness of breath or cough. Bowels moving regularly. No urinary complaints.  He reports an increase in "plantar warts" on the soles of the feet.   Objective: Blood pressure 139/87, pulse 67, temperature 98.9 F (37.2 C), temperature source Oral, resp. rate 18, height 5\' 8"  (1.727 m), weight 175 lb 9.6 oz (79.652 kg).  Oropharynx is without thrush or ulceration. No visible mass. No palpable cervical, supraclavicular, axillary or inguinal lymph nodes. Lungs are clear. No wheezes or rales. Regular cardiac rhythm. Abdomen is soft and nontender. No organomegaly. Extremities are without edema. Several slightly raised small thickened areas located on the sole of the right foot.  Lab Results: Lab Results  Component Value Date   WBC 2.8* 10/16/2011   HGB 16.1 10/16/2011   HCT 44.7 10/16/2011   MCV 87.1 10/16/2011   PLT 166 10/16/2011    Chemistry:    Chemistry      Component Value Date/Time   NA 138 11/21/2010 1108   K 4.1 11/21/2010 1108   CL 102 11/21/2010 1108   CO2 27 11/21/2010 1108   BUN 13 11/21/2010 1108   CREATININE 0.79 11/21/2010 1108      Component Value Date/Time   CALCIUM 9.3 11/21/2010 1108   ALKPHOS 45 11/21/2010 1108   AST 38* 11/21/2010 1108   ALT 57* 11/21/2010 1108   BILITOT 0.6 11/21/2010 1108       Studies/Results: No results found.  Medications: I have reviewed the patient's current medications.  Assessment/Plan:  1. 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 He entered clinical remission following chemotherapy and remained in remission when he was seen at the cancer center 02/24/2009. 2. 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 Staging PET scan 08/23/2010 with increased FDG activity at the left tonsillar fossa and no additional evidence of lymphoma. He completed 4 cycles of R-ICE with cycle #1 beginning on 09/19/2010 and cycle #4 on 11/21/2010. Repeat head and neck examination by Dr. Pollyann Kennedy following R-ICE/rituximab showed no residual lymphoma. He began radiation consolidation on 12/18/2010, radiation was completed on 01/22/2011. 3. History of neutropenia secondary to rituximab. 4. Anemia secondary to chemotherapy, status post a red blood cell transfusion 11/30/2010. The hemoglobin has normalized. 5. Mild thrombocytopenia secondary to chemotherapy. Resolved. 6. Hypertension. 7. Mild neutropenia likely related to chemotherapy. Improved. 8. "Plantar warts". He would like a referral to dermatology.  Disposition-[name] remains in clinical remission from the non-Hodgkin's lymphoma. He will return for an office and lab visit in 4 months. He will contact the office in the interim with any problems.  Plan reviewed with Dr. Truett Perna.   Lonna Cobb ANP/GNP-BC

## 2011-10-16 NOTE — Telephone Encounter (Signed)
gv pt appt January 2014 and appt w/Dr. Yetta Barre @ [facility] for 10/1 @ 8:40 am.

## 2011-12-18 ENCOUNTER — Telehealth: Payer: Self-pay | Admitting: *Deleted

## 2011-12-18 NOTE — Telephone Encounter (Signed)
Was out of town last week and "caught the crudd". Took few antibiotics he had on hand (cipro), but left the rest in hotel and did not complete the course. Feeling congested again today and asking if he should obtain another script from Dr. Truett Perna or call his PCP?

## 2011-12-18 NOTE — Telephone Encounter (Signed)
Notified patient to call PCP per Dr. Truett Perna.

## 2012-02-06 ENCOUNTER — Telehealth: Payer: Self-pay | Admitting: Oncology

## 2012-02-06 NOTE — Telephone Encounter (Signed)
Pt called and r/s appt from 02/12/12 lab and MD, nurse notified

## 2012-02-12 ENCOUNTER — Other Ambulatory Visit: Payer: Self-pay | Admitting: Lab

## 2012-02-12 ENCOUNTER — Ambulatory Visit: Payer: Self-pay | Admitting: Oncology

## 2012-03-06 ENCOUNTER — Telehealth: Payer: Self-pay | Admitting: *Deleted

## 2012-03-06 ENCOUNTER — Other Ambulatory Visit: Payer: Self-pay | Admitting: *Deleted

## 2012-03-06 NOTE — Telephone Encounter (Signed)
Left VM for patient call to reschedule his appointment of 2/14 if he would like due to weather conditions.

## 2012-03-06 NOTE — Telephone Encounter (Signed)
Patient prefers to cancel his appointment tomorrow and reschedule. POF to scheduler.

## 2012-03-07 ENCOUNTER — Other Ambulatory Visit: Payer: Self-pay

## 2012-03-07 ENCOUNTER — Ambulatory Visit: Payer: Self-pay | Admitting: Oncology

## 2012-03-07 ENCOUNTER — Telehealth: Payer: Self-pay | Admitting: Oncology

## 2012-03-07 NOTE — Telephone Encounter (Signed)
Talked to patient and gave him appt for lab and MD on 3/28, POF is for 1 month but no MD spot ,nurse notified

## 2012-04-18 ENCOUNTER — Ambulatory Visit (HOSPITAL_BASED_OUTPATIENT_CLINIC_OR_DEPARTMENT_OTHER): Payer: Managed Care, Other (non HMO) | Admitting: Oncology

## 2012-04-18 ENCOUNTER — Other Ambulatory Visit (HOSPITAL_BASED_OUTPATIENT_CLINIC_OR_DEPARTMENT_OTHER): Payer: Managed Care, Other (non HMO)

## 2012-04-18 ENCOUNTER — Telehealth: Payer: Self-pay | Admitting: Oncology

## 2012-04-18 VITALS — BP 135/88 | HR 61 | Temp 97.0°F | Resp 18 | Ht 68.0 in | Wt 182.5 lb

## 2012-04-18 DIAGNOSIS — C8590 Non-Hodgkin lymphoma, unspecified, unspecified site: Secondary | ICD-10-CM

## 2012-04-18 DIAGNOSIS — D6481 Anemia due to antineoplastic chemotherapy: Secondary | ICD-10-CM

## 2012-04-18 DIAGNOSIS — C8581 Other specified types of non-Hodgkin lymphoma, lymph nodes of head, face, and neck: Secondary | ICD-10-CM

## 2012-04-18 DIAGNOSIS — D702 Other drug-induced agranulocytosis: Secondary | ICD-10-CM

## 2012-04-18 DIAGNOSIS — D696 Thrombocytopenia, unspecified: Secondary | ICD-10-CM

## 2012-04-18 LAB — CBC WITH DIFFERENTIAL/PLATELET
Basophils Absolute: 0 10*3/uL (ref 0.0–0.1)
EOS%: 1.1 % (ref 0.0–7.0)
Eosinophils Absolute: 0 10*3/uL (ref 0.0–0.5)
HCT: 42.9 % (ref 38.4–49.9)
HGB: 15 g/dL (ref 13.0–17.1)
MONO#: 0.4 10*3/uL (ref 0.1–0.9)
NEUT#: 2.5 10*3/uL (ref 1.5–6.5)
NEUT%: 69.4 % (ref 39.0–75.0)
RDW: 12.6 % (ref 11.0–14.6)
WBC: 3.7 10*3/uL — ABNORMAL LOW (ref 4.0–10.3)
lymph#: 0.7 10*3/uL — ABNORMAL LOW (ref 0.9–3.3)

## 2012-04-18 NOTE — Progress Notes (Signed)
   [facility]    OFFICE PROGRESS NOTE   INTERVAL HISTORY:   He returns as scheduled. He reports mid back pain for the past one month. The pain is present with certain movements and is to the right of midline. He otherwise feels well. No fever or, anorexia, or palpable lymph nodes. He is runs 3 miles several days per week.  Objective:  Vital signs in last 24 hours:  Blood pressure 135/88, pulse 61, temperature 97 F (36.1 C), temperature source Oral, resp. rate 18, height 5\' 8"  (1.727 m), weight 182 lb 8 oz (82.781 kg).    HEENT: Oropharynx without visible mass, neck without mass Lymphatics: No cervical, supraclavicular, or inguinal nodes.? 1.5 cm mobile medial left axillary node and 1/2-1 cm right axillary node Resp: Lungs clear bilaterally Cardio: Regular rate and rhythm GI: No hepatomegaly, nontender Vascular: No leg edema Musculoskeletal: Slight prominence at the right upper back musculature compared to the left side, no tenderness or mass. No spine tenderness.    Portacath/PICC-without erythema  Lab Results:  Lab Results  Component Value Date   WBC 3.7* 04/18/2012   HGB 15.0 04/18/2012   HCT 42.9 04/18/2012   MCV 87.9 04/18/2012   PLT 138* 04/18/2012   ANC 2.5   Medications: I have reviewed the patient's current medications.  Assessment/Plan: 1. 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 He entered clinical remission following chemotherapy and remained in remission when he was seen at the cancer center 02/24/2009. 2. 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 Staging PET scan 08/23/2010 with increased FDG activity at the left tonsillar fossa and no additional evidence of lymphoma. He completed 4 cycles of R-ICE with cycle #1  beginning on 09/19/2010 and cycle #4 on 11/21/2010. Repeat head and neck examination by Dr. Pollyann Kennedy following R-ICE/rituximab showed no residual lymphoma. He began radiation consolidation on 12/18/2010, radiation was completed on 01/22/2011. 3. History of neutropenia secondary to rituximab. 4. Anemia secondary to chemotherapy, status post a red blood cell transfusion 11/30/2010. The hemoglobin has normalized. 5. History of Mild thrombocytopenia secondary to chemotherapy. 6. Hypertension. 7. Mild neutropenia likely related to chemotherapy. Improved. 8. Mid back discomfort-likely related to benign musculoskeletal condition. He will contact us if the pain does not improve and we will make an orthopedic referral. 9. ? Small left greater than right axillary lymph nodes-he will return for a repeat exam in one month.   Disposition:  He remains in clinical remission from non-Hodgkin's lymphoma. I suspect the back discomfort is related to a benign musculoskeletal condition. The axillary "lymph nodes "may represent prominent fat pads. He will return for a repeat exam in one month. [name] will contact us in the interim for increased back pain or new symptoms.   Thornton Papas, MD  04/18/2012  6:24 PM

## 2012-04-18 NOTE — Telephone Encounter (Signed)
Gave patient appt for MD for April 2014

## 2012-05-19 ENCOUNTER — Telehealth: Payer: Self-pay | Admitting: Oncology

## 2012-05-19 ENCOUNTER — Ambulatory Visit (HOSPITAL_BASED_OUTPATIENT_CLINIC_OR_DEPARTMENT_OTHER): Payer: Managed Care, Other (non HMO) | Admitting: Oncology

## 2012-05-19 VITALS — BP 120/76 | HR 73 | Temp 97.9°F | Resp 20 | Ht 68.0 in | Wt 181.2 lb

## 2012-05-19 DIAGNOSIS — C8589 Other specified types of non-Hodgkin lymphoma, extranodal and solid organ sites: Secondary | ICD-10-CM

## 2012-05-19 DIAGNOSIS — T451X5A Adverse effect of antineoplastic and immunosuppressive drugs, initial encounter: Secondary | ICD-10-CM

## 2012-05-19 DIAGNOSIS — C8590 Non-Hodgkin lymphoma, unspecified, unspecified site: Secondary | ICD-10-CM

## 2012-05-19 DIAGNOSIS — D709 Neutropenia, unspecified: Secondary | ICD-10-CM

## 2012-05-19 DIAGNOSIS — R599 Enlarged lymph nodes, unspecified: Secondary | ICD-10-CM

## 2012-05-19 NOTE — Progress Notes (Signed)
   [facility]    OFFICE PROGRESS NOTE   INTERVAL HISTORY:   He returns as scheduled. The back pain has resolved. No fever or night sweats. No new complaint. No change in the axillary fullness.  Objective:  Vital signs in last 24 hours:  Blood pressure 120/76, pulse 73, temperature 97.9 F (36.6 C), temperature source Oral, resp. rate 20, height 5\' 8"  (1.727 m), weight 181 lb 3.2 oz (82.192 kg).    HEENT: Oropharynx without visible mass, neck without mass Lymphatics: No cervical, supraclavicular, or inguinal nodes.? 1 cm mobile right axillary node,? 2 cm left axillary node Resp: Lungs clear bilaterally Cardio: Regular rate and rhythm GI: No hepatosplenomegaly Vascular: No leg edema   Lab Results:  Lab Results  Component Value Date   WBC 3.7* 04/18/2012   HGB 15.0 04/18/2012   HCT 42.9 04/18/2012   MCV 87.9 04/18/2012   PLT 138* 04/18/2012      Medications: I have reviewed the patient's current medications.  Assessment/Plan: 1. 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 He entered clinical remission following chemotherapy and remained in remission when he was seen at the cancer center 02/24/2009. 2. 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 Staging PET scan 08/23/2010 with increased FDG activity at the left tonsillar fossa and no additional evidence of lymphoma. He completed 4 cycles of R-ICE with cycle #1 beginning on 09/19/2010 and cycle #4 on 11/21/2010. Repeat head and neck examination by Dr. Pollyann Kennedy following R-ICE/rituximab showed no residual lymphoma. He began radiation consolidation on 12/18/2010, radiation was completed on 01/22/2011. 3. History of neutropenia secondary to rituximab. 4. Anemia secondary to chemotherapy,  status post a red blood cell transfusion 11/30/2010. The hemoglobin has normalized. 5. History of Mild thrombocytopenia secondary to chemotherapy. 6. Hypertension. 7. Mild neutropenia likely related to chemotherapy. Improved. 8. ? Small left greater than right axillary lymph nodes-the left axillary node appears slightly larger today and more discrete  Disposition:  [name] appears stable. The left axillary fullness is slightly more prominent today and may represent an enlarging lymph node. We discussed continued observation versus referring him for restaging CT scans. He is comfortable with a continued observation approach. He will check the axilla weekly and contact me if he notes a significant change in the palpable fullness. [name] will return for an office visit in one month.   Thornton Papas, MD  05/19/2012  11:05 AM

## 2012-06-23 ENCOUNTER — Ambulatory Visit (HOSPITAL_BASED_OUTPATIENT_CLINIC_OR_DEPARTMENT_OTHER): Payer: Managed Care, Other (non HMO) | Admitting: Oncology

## 2012-06-23 ENCOUNTER — Encounter: Payer: Self-pay | Admitting: Oncology

## 2012-06-23 ENCOUNTER — Telehealth: Payer: Self-pay | Admitting: Oncology

## 2012-06-23 VITALS — BP 132/87 | HR 64 | Temp 97.8°F | Resp 18 | Ht 68.0 in | Wt 180.0 lb

## 2012-06-23 DIAGNOSIS — C8590 Non-Hodgkin lymphoma, unspecified, unspecified site: Secondary | ICD-10-CM

## 2012-06-23 DIAGNOSIS — C8581 Other specified types of non-Hodgkin lymphoma, lymph nodes of head, face, and neck: Secondary | ICD-10-CM

## 2012-06-23 NOTE — Telephone Encounter (Signed)
gv and printed appt schedand avs for pt for Sept °

## 2012-06-23 NOTE — Progress Notes (Signed)
   [name]    OFFICE PROGRESS NOTE   INTERVAL HISTORY:   He returns as scheduled. He had a recent mild sore throat. This has resolved. He feels well. No palpable lymph nodes. No pain.  Objective:  Vital signs in last 24 hours:  Blood pressure 132/87, pulse 64, temperature 97.8 F (36.6 C), temperature source Oral, resp. rate 18, height 5\' 8"  (1.727 m), weight 180 lb (81.647 kg).    HEENT: Oropharynx without visible mass, neck without mass Lymphatics: No cervical, supraclavicular, or inguinal nodes. Prominent left greater than right axillary fat pads with out discrete lymph nodes.? Soft mobile 1/2-1 cm bilateral nodes. Resp: Lungs clear bilaterally Cardio: Regular rate and rhythm GI: No hepatosplenomegaly Vascular: No leg edema   Lab Results:  Lab Results  Component Value Date   WBC 3.7* 04/18/2012   HGB 15.0 04/18/2012   HCT 42.9 04/18/2012   MCV 87.9 04/18/2012   PLT 138* 04/18/2012   ANC 2.5    Medications: I have reviewed the patient's current medications.  Assessment/Plan: 1. 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 He entered clinical remission following chemotherapy and remained in remission when [facility]. 2. 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 Staging PET scan 08/23/2010 with increased FDG activity at the left tonsillar fossa and no additional evidence of lymphoma. He completed 4 cycles of R-ICE with cycle #1 beginning on 09/19/2010 and cycle #4 on 11/21/2010. Repeat head and neck examination by Dr. Pollyann Kennedy following R-ICE/rituximab showed no residual lymphoma. He began radiation consolidation on 12/18/2010, radiation was completed on 01/22/2011. 3. History of neutropenia  secondary to rituximab. 4. Anemia secondary to chemotherapy, status post a red blood cell transfusion 11/30/2010. The hemoglobin has normalized. 5. History of Mild thrombocytopenia secondary to chemotherapy. 6. Hypertension. 7. Mild neutropenia likely related to chemotherapy. Improved. 8. ? Small left greater than right axillary lymph nodes-these areas appears smaller and less discrete today.  Disposition:  He remains in clinical remission from the non-Hodgkin's lymphoma. No evidence of progressive lymphadenopathy on exam today. [name] will return for an office visit and CBC in 3 months. He will contact us in the interim for new symptoms or palpable lymphadenopathy.   Thornton Papas, MD  06/23/2012  5:34 PM

## 2012-09-29 ENCOUNTER — Ambulatory Visit (HOSPITAL_BASED_OUTPATIENT_CLINIC_OR_DEPARTMENT_OTHER): Payer: Managed Care, Other (non HMO) | Admitting: Oncology

## 2012-09-29 ENCOUNTER — Telehealth: Payer: Self-pay | Admitting: Oncology

## 2012-09-29 ENCOUNTER — Other Ambulatory Visit (HOSPITAL_BASED_OUTPATIENT_CLINIC_OR_DEPARTMENT_OTHER): Payer: Managed Care, Other (non HMO) | Admitting: Lab

## 2012-09-29 VITALS — BP 143/94 | HR 65 | Temp 98.5°F | Resp 18 | Ht 68.0 in | Wt 183.6 lb

## 2012-09-29 DIAGNOSIS — C8581 Other specified types of non-Hodgkin lymphoma, lymph nodes of head, face, and neck: Secondary | ICD-10-CM

## 2012-09-29 DIAGNOSIS — C8590 Non-Hodgkin lymphoma, unspecified, unspecified site: Secondary | ICD-10-CM

## 2012-09-29 DIAGNOSIS — R229 Localized swelling, mass and lump, unspecified: Secondary | ICD-10-CM

## 2012-09-29 LAB — CBC WITH DIFFERENTIAL/PLATELET
Basophils Absolute: 0 10*3/uL (ref 0.0–0.1)
Eosinophils Absolute: 0.1 10*3/uL (ref 0.0–0.5)
HCT: 43 % (ref 38.4–49.9)
HGB: 15.4 g/dL (ref 13.0–17.1)
LYMPH%: 23 % (ref 14.0–49.0)
MCV: 85.8 fL (ref 79.3–98.0)
MONO#: 0.4 10*3/uL (ref 0.1–0.9)
NEUT#: 2.8 10*3/uL (ref 1.5–6.5)
Platelets: 150 10*3/uL (ref 140–400)
RBC: 5.01 10*6/uL (ref 4.20–5.82)
WBC: 4.3 10*3/uL (ref 4.0–10.3)
nRBC: 0 % (ref 0–0)

## 2012-09-29 NOTE — Progress Notes (Signed)
   [facility]    OFFICE PROGRESS NOTE   INTERVAL HISTORY:   He returns as scheduled. He feels well. Good appetite and energy level. No palpable lymph nodes. He has noted a lump at the posterior left thigh. This has been present for 6 months and he wonders whether he is feeling a muscle.  Objective:  Vital signs in last 24 hours:  Blood pressure 143/94, pulse 65, temperature 98.5 F (36.9 C), temperature source Oral, resp. rate 18, height 5\' 8"  (1.727 m), weight 183 lb 9.6 oz (83.28 kg).    HEENT: oropharynx without visible mass, neck without mass Lymphatics: no cervical, supraclavicular, right axillary, or right inguinal nodes. 1/2 cm mobile left axillary node. Pea-sized left inguinal node. Resp: lungs clear bilaterally Cardio: regular rate and rhythm GI: no hepatosplenomegaly, no mass, nontender Vascular: no leg edema Musculoskeletal: At the posterior left thigh and separate from the thigh musculature there is a 3-4 cm smooth rounded mass within the deep subcutaneous tissue. The mass is mobile, firm, and nontender     Lab Results:  Lab Results  Component Value Date   WBC 4.3 09/29/2012   HGB 15.4 09/29/2012   HCT 43.0 09/29/2012   MCV 85.8 09/29/2012   PLT 150 09/29/2012  ANC 2.8    Medications: I have reviewed the patient's current medications.  Assessment/Plan: 1. 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 He entered clinical remission following chemotherapy and remained in remission when he was seen at the cancer center 02/24/2009. 2. 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 Staging PET scan 08/23/2010 with increased FDG activity at the left tonsillar fossa and no additional evidence of lymphoma. He completed 4 cycles of R-ICE  with cycle #1 beginning on 09/19/2010 and cycle #4 on 11/21/2010. Repeat head and neck examination by Dr. Pollyann Kennedy following R-ICE/rituximab showed no residual lymphoma. He began radiation consolidation on 12/18/2010, radiation was completed on 01/22/2011. 3. History of neutropenia secondary to rituximab. 4. Anemia secondary to chemotherapy, status post a red blood cell transfusion 11/30/2010. The hemoglobin has normalized. 5. History of Mild thrombocytopenia secondary to chemotherapy. 6. Hypertension. 7. Mild neutropenia likely related to chemotherapy. Improved.       8.   ? Small left greater than right axillary lymph nodes-there is a small left axillary lymph node and a pea-sized left inguinal node on exam today.       9.   Left thigh mass-? Cyst,? Lipoma,? Lymphoma,? Soft tissue tumor    Disposition:  He appears well. He has developed a mass lesion at the posterior left thigh. I discussed the differential diagnosis with [name]. We decided to proceed with a CT of the left thigh and then decide on the indication for a biopsy procedure.  He will return for an office visit in 3 months. We will see him sooner pending the CT evaluation.   Thornton Papas, MD  09/29/2012  9:18 PM

## 2012-09-29 NOTE — Telephone Encounter (Signed)
Gave pt appt for lab and MD for December 2014,referral given to [facility] for precert

## 2012-10-03 ENCOUNTER — Other Ambulatory Visit: Payer: Self-pay

## 2012-10-03 ENCOUNTER — Telehealth: Payer: Self-pay

## 2012-10-03 DIAGNOSIS — C8590 Non-Hodgkin lymphoma, unspecified, unspecified site: Secondary | ICD-10-CM

## 2012-10-03 NOTE — Telephone Encounter (Signed)
S/w pt that Dr Truett Perna will be cancelling CT and scheduling MRI. Explained that lab appt has to be about 1 hr before so the lab can run the tests.

## 2012-10-10 ENCOUNTER — Other Ambulatory Visit: Payer: Self-pay | Admitting: Oncology

## 2012-10-10 DIAGNOSIS — C8590 Non-Hodgkin lymphoma, unspecified, unspecified site: Secondary | ICD-10-CM

## 2012-10-13 ENCOUNTER — Other Ambulatory Visit (HOSPITAL_BASED_OUTPATIENT_CLINIC_OR_DEPARTMENT_OTHER): Payer: Managed Care, Other (non HMO)

## 2012-10-13 ENCOUNTER — Ambulatory Visit (HOSPITAL_COMMUNITY): Payer: Managed Care, Other (non HMO)

## 2012-10-13 ENCOUNTER — Ambulatory Visit (HOSPITAL_COMMUNITY)
Admission: RE | Admit: 2012-10-13 | Discharge: 2012-10-13 | Disposition: A | Discharge status: Home / Self Care | Payer: Managed Care, Other (non HMO) | Source: Ambulatory Visit | Attending: Oncology | Admitting: Oncology

## 2012-10-13 DIAGNOSIS — C8589 Other specified types of non-Hodgkin lymphoma, extranodal and solid organ sites: Secondary | ICD-10-CM

## 2012-10-13 DIAGNOSIS — C8590 Non-Hodgkin lymphoma, unspecified, unspecified site: Secondary | ICD-10-CM

## 2012-10-13 DIAGNOSIS — M799 Soft tissue disorder, unspecified: Secondary | ICD-10-CM | POA: Insufficient documentation

## 2012-10-13 LAB — BASIC METABOLIC PANEL (CC13)
CO2: 24 mEq/L (ref 22–29)
Chloride: 105 mEq/L (ref 98–109)
Creatinine: 0.9 mg/dL (ref 0.7–1.3)
Glucose: 98 mg/dl (ref 70–140)

## 2012-10-13 MED ORDER — GADOBENATE DIMEGLUMINE 529 MG/ML IV SOLN
20.0000 mL | Freq: Once | INTRAVENOUS | Status: AC | PRN
  Administered 2012-10-13: 17 mL via INTRAVENOUS

## 2012-10-14 ENCOUNTER — Telehealth: Payer: Self-pay | Admitting: *Deleted

## 2012-10-14 NOTE — Telephone Encounter (Signed)
Message from pt requesting MRI results. Per Dr. Truett Perna: MRI shows benign nerve sheath tumor. MD plans to discuss with surgeon to see if this needs to be removed. Pt voiced understanding, appreciation for call.

## 2012-10-15 ENCOUNTER — Other Ambulatory Visit: Payer: Self-pay | Admitting: Oncology

## 2012-10-15 DIAGNOSIS — C8590 Non-Hodgkin lymphoma, unspecified, unspecified site: Secondary | ICD-10-CM

## 2012-10-16 ENCOUNTER — Telehealth: Payer: Self-pay | Admitting: Oncology

## 2012-10-16 NOTE — Telephone Encounter (Signed)
Called [facility] @ [facility] referred pt to Dr. Derrell Lolling for resection of thigh mass,pt notified

## 2012-10-23 ENCOUNTER — Ambulatory Visit (INDEPENDENT_AMBULATORY_CARE_PROVIDER_SITE_OTHER): Payer: Managed Care, Other (non HMO) | Admitting: General Surgery

## 2012-10-28 ENCOUNTER — Other Ambulatory Visit: Payer: Self-pay | Admitting: Radiology

## 2012-10-28 ENCOUNTER — Encounter (INDEPENDENT_AMBULATORY_CARE_PROVIDER_SITE_OTHER): Payer: Self-pay | Admitting: General Surgery

## 2012-10-28 ENCOUNTER — Ambulatory Visit (INDEPENDENT_AMBULATORY_CARE_PROVIDER_SITE_OTHER): Payer: Managed Care, Other (non HMO) | Admitting: General Surgery

## 2012-10-28 VITALS — BP 132/90 | HR 76 | Resp 14 | Ht 68.0 in | Wt 184.8 lb

## 2012-10-28 DIAGNOSIS — D219 Benign neoplasm of connective and other soft tissue, unspecified: Secondary | ICD-10-CM

## 2012-10-28 DIAGNOSIS — D3610 Benign neoplasm of peripheral nerves and autonomic nervous system, unspecified: Secondary | ICD-10-CM

## 2012-10-28 NOTE — Patient Instructions (Signed)
Your MRI shows a well circumscribed mass in the posterior lateral left thigh. This is adjacent to the sciatic nerve, which is the main nerve going down the back of her leg to the foot.  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 There are other possibilities, less likely.  These are usually benign and slow-growing, but something will need to be done to avoid progression of peripheral nerve symptoms.  The first step is to get an x-ray guided biopsy so we can be sure of the diagnosis.  I will call that report to you as soon as I get it. It is possible that we will [facility] of this.

## 2012-10-28 NOTE — Progress Notes (Signed)
Patient ID: [name], male   DOB: 13-May-1960, 52 y.o.   MRN: [id number]  Chief Complaint  Patient presents with  . New Evaluation    eval for resection of thigh mass    HPI [name] is a 52 y.o. male.  He is referred by Dr. 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 He recurred once and has been treated and right now is thought to be disease free.  He is healthy plays golf regularly has no problems when he plays golf. He noticed a lump in his left posterior lateral thigh about 6 months ago. He notices it when he crosses his legs and he can feel something but it does not tender there is no pain he has no muscle weakness. More recently has noticed a little bit of numbness in his heel and left lateral thigh. This is intermittent and related usually to sitting in a chair.  MRI shows a 4.9 x 4.7 x 3.5 cm posterior and lateral upper thigh mass on the left. This has MR imaging features consistent with the nerve sheath tumor(schwannoma).   No involvement of the surrounding musculature. I have reviewed this and there are a few nerve fibers draping around this on both sides. HPI  Past Medical History  Diagnosis Date  . Non Hodgkin's lymphoma     s/p 4 cycles R-ICE, start 11/21/10  . Hypertension   . Neutropenia     Secondary to Rituxan  . History of radiation therapy 12/18/10 to 01/22/11    left oropharynx    Past Surgical History  Procedure Laterality Date  . Portacath placement  09/19/10  . Tonsillectomy      Bilateral  . Biopsy pharynx      09/20/10 excision right posterior pharynx  . Bone marrow aspiration       09/08/10 Biopsy , and clot, left iliac crest    History reviewed. No pertinent family history.  Social History History  Substance Use Topics  . Smoking status: Former Smoker -- 0.50 packs/day for 5  years    Types: Cigarettes    Quit date: 11/30/1990  . Smokeless tobacco: Not on file  . Alcohol Use: No    Allergies  Allergen Reactions  . Penicillins Other (See Comments)    Childhood allergy reaction unknown     Current Outpatient Prescriptions  Medication Sig Dispense Refill  . ibuprofen (ADVIL,MOTRIN) 200 MG tablet Take 200 mg by mouth every 12 (twelve) hours as needed. For pain       No current facility-administered medications for this visit.   Facility-Administered Medications Ordered in Other Visits  Medication Dose Route Frequency Provider Last Rate Last Dose  . heparin flush 10 UNIT/ML injection 10 Units  10 Units Intravenous Once Ladene Artist, MD      . sodium chloride 0.9 % injection 10 mL  10 mL Intravenous PRN Ladene Artist, MD        Review of Systems Review of Systems  Constitutional: Negative for fever, chills and unexpected weight change.  HENT: Negative for hearing loss, congestion, sore throat, trouble swallowing and voice change.   Eyes: Negative for visual disturbance.  Respiratory: Negative for cough and wheezing.   Cardiovascular: Negative for chest pain, palpitations and leg swelling.  Gastrointestinal: Negative for nausea, vomiting, abdominal  pain, diarrhea, constipation, blood in stool, abdominal distention, anal bleeding and rectal pain.  Genitourinary: Negative for hematuria and difficulty urinating.  Musculoskeletal: Negative for myalgias, back pain, joint swelling, arthralgias and gait problem.  Skin: Negative for rash and wound.  Neurological: Positive for numbness. Negative for seizures, syncope, weakness and headaches.  Hematological: Negative for adenopathy. Does not bruise/bleed easily.  Psychiatric/Behavioral: Negative for confusion.    Blood pressure 132/90, pulse 76, resp. rate 14, height 5\' 8"  (1.727 m), weight 184 lb 12.8 oz (83.825 kg).  Physical Exam Physical Exam  Constitutional: He is oriented to person, place, and time.  He appears well-developed and well-nourished. No distress.  HENT:  Head: Normocephalic.  Nose: Nose normal.  Mouth/Throat: No oropharyngeal exudate.  Eyes: Conjunctivae and EOM are normal. Pupils are equal, round, and reactive to light. Right eye exhibits no discharge. Left eye exhibits no discharge. No scleral icterus.  Neck: Normal range of motion. Neck supple. No JVD present. No tracheal deviation present. No thyromegaly present.  No neck mass.  Cardiovascular: Normal rate, regular rhythm, normal heart sounds and intact distal pulses.   No murmur heard. Pulmonary/Chest: Effort normal and breath sounds normal. No stridor. No respiratory distress. He has no wheezes. He has no rales. He exhibits no tenderness.  Abdominal: Soft. Bowel sounds are normal. He exhibits no distension and no mass. There is no tenderness. There is no rebound and no guarding.  No abdominal mass.  Genitourinary:  No inguinal adenopathy  Musculoskeletal: Normal range of motion. He exhibits no edema and no tenderness.  There is a palpable mass in the left posterolateral thigh, this is deep within the muscle and it is hard to feel the borders. May be 4 cm in size. can feel it mostly when he lies on his side across his his leg. Permanent tissue mark was placed immediately over the palpable tumor.  Lymphadenopathy:    He has no cervical adenopathy.  Neurological: He is alert and oriented to person, place, and time. He has normal reflexes. Coordination normal.  I cannot detect any muscle strength weakness, however his left ankle reflexes may be less brisk than his right.  Skin: Skin is warm and dry. No rash noted. He is not diaphoretic. No erythema. No pallor.  Psychiatric: He has a normal mood and affect. His behavior is normal. Judgment and thought content normal.    Data Reviewed MRI lower extremity. Discussion with Dr. Truett Perna. Discussion with Dr. Donell Beers.  Assessment    Left posterolateral thigh mass. 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 Other considerations or neurofibroma, malignant nerve sheath tumor, or sarcoma. These are felt to be less likely  History of lymphoma of the pharynx.     Plan    I had a long discussion with the Jailyn and his wife. I showed him the MRI pictures. I discussed the differential diagnosis. I told them that the eventual goal would be careful resection of this mass, with the biggest risk being damage to the sciatic nerve.  I told them that in terms of surgical planning, an image guided core biopsy directly over the tumor would be the next Step. He agrees with this and we will schedule this.  I told them that these are very unusual tumors in this location, and suggested [facility] to someone who specializes in this and sees these  more frequently. He agrees with that. We will decide about referral after we  get the tissue diagnosis.        Angelia Mould. Derrell Lolling, M.D., [facility] General and Minimally invasive Surgery Breast and Colorectal Surgery Office:   [phone] Pager:   [phone]  10/28/2012, 9:50 AM

## 2012-10-30 ENCOUNTER — Ambulatory Visit (HOSPITAL_COMMUNITY)
Admission: RE | Admit: 2012-10-30 | Discharge: 2012-10-30 | Disposition: A | Discharge status: Home / Self Care | Payer: Managed Care, Other (non HMO) | Source: Ambulatory Visit | Attending: General Surgery | Admitting: General Surgery

## 2012-10-30 ENCOUNTER — Other Ambulatory Visit (INDEPENDENT_AMBULATORY_CARE_PROVIDER_SITE_OTHER): Payer: Self-pay | Admitting: General Surgery

## 2012-10-30 ENCOUNTER — Encounter (HOSPITAL_COMMUNITY): Payer: Self-pay

## 2012-10-30 DIAGNOSIS — D2120 Benign neoplasm of connective and other soft tissue of unspecified lower limb, including hip: Secondary | ICD-10-CM | POA: Insufficient documentation

## 2012-10-30 DIAGNOSIS — D3610 Benign neoplasm of peripheral nerves and autonomic nervous system, unspecified: Secondary | ICD-10-CM

## 2012-10-30 DIAGNOSIS — Z923 Personal history of irradiation: Secondary | ICD-10-CM | POA: Insufficient documentation

## 2012-10-30 DIAGNOSIS — I10 Essential (primary) hypertension: Secondary | ICD-10-CM | POA: Insufficient documentation

## 2012-10-30 DIAGNOSIS — C8589 Other specified types of non-Hodgkin lymphoma, extranodal and solid organ sites: Secondary | ICD-10-CM | POA: Insufficient documentation

## 2012-10-30 LAB — PROTIME-INR
INR: 1.02 (ref 0.00–1.49)
Prothrombin Time: 13.2 seconds (ref 11.6–15.2)

## 2012-10-30 LAB — CBC
Hemoglobin: 15.5 g/dL (ref 13.0–17.0)
MCH: 31.4 pg (ref 26.0–34.0)
MCHC: 36.2 g/dL — ABNORMAL HIGH (ref 30.0–36.0)
RBC: 4.94 MIL/uL (ref 4.22–5.81)
RDW: 12.6 % (ref 11.5–15.5)

## 2012-10-30 LAB — APTT: aPTT: 26 seconds (ref 24–37)

## 2012-10-30 MED ORDER — MIDAZOLAM HCL 2 MG/2ML IJ SOLN
INTRAMUSCULAR | Status: AC
  Filled 2012-10-30: qty 4

## 2012-10-30 MED ORDER — HYDROCODONE-ACETAMINOPHEN 5-325 MG PO TABS: 1.0000 | ORAL_TABLET | ORAL | Status: DC | PRN

## 2012-10-30 MED ORDER — SODIUM CHLORIDE 0.9 % IV SOLN: Freq: Once | INTRAVENOUS | Status: DC

## 2012-10-30 MED ORDER — FENTANYL CITRATE 0.05 MG/ML IJ SOLN
INTRAMUSCULAR | Status: AC
  Filled 2012-10-30: qty 4

## 2012-10-30 MED ORDER — MIDAZOLAM HCL 2 MG/2ML IJ SOLN
INTRAMUSCULAR | Status: AC | PRN
  Administered 2012-10-30: 2 mg via INTRAVENOUS

## 2012-10-30 MED ORDER — FENTANYL CITRATE 0.05 MG/ML IJ SOLN
INTRAMUSCULAR | Status: AC | PRN
  Administered 2012-10-30 (×2): 50 ug via INTRAVENOUS

## 2012-10-30 NOTE — ED Notes (Signed)
May be discharged at 6pm.

## 2012-10-30 NOTE — Procedures (Signed)
Successful [place] BX OF LLE PERIPHERAL POSTERIOR MASS NO COMP STABLE PATH PENDING

## 2012-10-30 NOTE — ED Notes (Signed)
MD wants pt lay on belly for next hour, then may roll onto back

## 2012-10-30 NOTE — H&P (Signed)
Chief Complaint: "I'm here for a leg biopsy" Referring Physician:Ingram HPI: [name] is an 52 y.o. male with prior hx of lymphoma that he completed treatment for. He is now found to have a left leg mass of uncertain etiology but is felt to be unrelated. He has had MRI and is now referred for biopsy. PMHx and meds reviewed. Pt feels ok.  Past Medical History:  Past Medical History  Diagnosis Date  . Non Hodgkin's lymphoma     s/p 4 cycles R-ICE, start 11/21/10  . Hypertension   . Neutropenia     Secondary to Rituxan  . History of radiation therapy 12/18/10 to 01/22/11    left oropharynx    Past Surgical History:  Past Surgical History  Procedure Laterality Date  . Portacath placement  09/19/10  . Tonsillectomy      Bilateral  . Biopsy pharynx      09/20/10 excision right posterior pharynx  . Bone marrow aspiration       09/08/10 Biopsy , and clot, left iliac crest    Family History: History reviewed. No pertinent family history.  Social History:  reports that he quit smoking about 21 years ago. His smoking use included Cigarettes. He has a 2.5 pack-year smoking history. He does not have any smokeless tobacco history on file. He reports that he does not drink alcohol or use illicit drugs.  Allergies:  Allergies  Allergen Reactions  . Penicillins Other (See Comments)    Childhood allergy reaction unknown     Medications:   Medication List    ASK your doctor about these medications       ibuprofen 200 MG tablet  Commonly known as:  ADVIL,MOTRIN  Take 200 mg by mouth every 12 (twelve) hours as needed. For pain        Please HPI for pertinent positives, otherwise complete 10 system ROS negative.  Physical Exam: BP 142/95  Pulse 62  Temp(Src) 97.9 F (36.6 C) (Oral)  Resp 16  Ht 5\' 8"  (1.727 m)  Wt 180 lb (81.647 kg)  BMI 27.38 kg/m2  SpO2 100% Body mass index is 27.38 kg/(m^2).   General Appearance:  Alert, cooperative, no distress, appears stated age   Head:  Normocephalic, without obvious abnormality, atraumatic  ENT: Unremarkable  Neck: Supple, symmetrical, trachea midline  Lungs:   Clear to auscultation bilaterally, no w/r/r, respirations unlabored without use of accessory muscles.  Chest Wall:  No tenderness or deformity  Heart:  Regular rate and rhythm, S1, S2 normal, no murmur, rub or gallop.  Extremities: Palpable mass posterior left thigh region. NT  Pulses: 2+ and symmetric  Neurologic: Normal affect, no gross deficits.   No results found for this or any previous visit (from the past 48 hour(s)). No results found.  Assessment/Plan (L)thigh mass Discussed US guided biopsy Explained procedure, risks, complications, use of sedation. Labs pending Consent signed in chart  Brayton El PA-C 10/30/2012, 1:44 PM

## 2012-11-05 ENCOUNTER — Telehealth (INDEPENDENT_AMBULATORY_CARE_PROVIDER_SITE_OTHER): Payer: Self-pay

## 2012-11-05 DIAGNOSIS — D3610 Benign neoplasm of peripheral nerves and autonomic nervous system, unspecified: Secondary | ICD-10-CM

## 2012-11-05 NOTE — Telephone Encounter (Signed)
I called and spoke to the pt and he is aware of the appointment.  I told him there office is in the ambulatory care center.  I asked him to pick up his MRI on a disc at [facility].  They are open between 7 am to 8 pm.  He will go by today.  The disc will be ready at the front desk.

## 2012-11-05 NOTE — Telephone Encounter (Signed)
I received a call from Dr Judeth Horn office, the orthopedic oncologist.  They are scheduling him to see Dr Darral Dash 10/21 at 1:45.  The need me to fax notes to [phone] attention Jilda Panda.  They need the pt to pick up his MRI on a disc and bring it with him.  The appointment is in the ambulatory care center.

## 2012-11-10 ENCOUNTER — Telehealth (INDEPENDENT_AMBULATORY_CARE_PROVIDER_SITE_OTHER): Payer: Self-pay

## 2012-11-10 NOTE — Telephone Encounter (Signed)
Left message for the pt to let him know he doesn't need to keep appointment 11/17/2012.  I am cancelling that per Dr Derrell Lolling.

## 2012-11-17 ENCOUNTER — Encounter (INDEPENDENT_AMBULATORY_CARE_PROVIDER_SITE_OTHER): Payer: Managed Care, Other (non HMO) | Admitting: General Surgery

## 2012-12-31 ENCOUNTER — Telehealth: Payer: Self-pay | Admitting: Oncology

## 2012-12-31 NOTE — Telephone Encounter (Signed)
R/S appt for pt to April 2015 from december 2014 ,nurse notified

## 2013-01-02 ENCOUNTER — Other Ambulatory Visit: Payer: Self-pay

## 2013-01-02 ENCOUNTER — Ambulatory Visit: Payer: Self-pay | Admitting: Oncology

## 2013-05-13 ENCOUNTER — Other Ambulatory Visit: Payer: Self-pay | Admitting: *Deleted

## 2013-05-13 DIAGNOSIS — C8590 Non-Hodgkin lymphoma, unspecified, unspecified site: Secondary | ICD-10-CM

## 2013-05-14 ENCOUNTER — Ambulatory Visit (HOSPITAL_BASED_OUTPATIENT_CLINIC_OR_DEPARTMENT_OTHER): Payer: Managed Care, Other (non HMO) | Admitting: Oncology

## 2013-05-14 ENCOUNTER — Other Ambulatory Visit (HOSPITAL_BASED_OUTPATIENT_CLINIC_OR_DEPARTMENT_OTHER): Payer: Managed Care, Other (non HMO)

## 2013-05-14 VITALS — BP 148/96 | HR 65 | Temp 97.3°F | Resp 18 | Ht 68.0 in | Wt 191.5 lb

## 2013-05-14 DIAGNOSIS — I10 Essential (primary) hypertension: Secondary | ICD-10-CM

## 2013-05-14 DIAGNOSIS — C8590 Non-Hodgkin lymphoma, unspecified, unspecified site: Secondary | ICD-10-CM

## 2013-05-14 DIAGNOSIS — C8588 Other specified types of non-Hodgkin lymphoma, lymph nodes of multiple sites: Secondary | ICD-10-CM

## 2013-05-14 DIAGNOSIS — D6481 Anemia due to antineoplastic chemotherapy: Secondary | ICD-10-CM

## 2013-05-14 DIAGNOSIS — D2120 Benign neoplasm of connective and other soft tissue of unspecified lower limb, including hip: Secondary | ICD-10-CM

## 2013-05-14 DIAGNOSIS — T451X5A Adverse effect of antineoplastic and immunosuppressive drugs, initial encounter: Secondary | ICD-10-CM

## 2013-05-14 LAB — CBC WITH DIFFERENTIAL/PLATELET
BASO%: 0.4 % (ref 0.0–2.0)
BASOS ABS: 0 10*3/uL (ref 0.0–0.1)
EOS ABS: 0.1 10*3/uL (ref 0.0–0.5)
EOS%: 1.1 % (ref 0.0–7.0)
HCT: 43.7 % (ref 38.4–49.9)
HEMOGLOBIN: 15.5 g/dL (ref 13.0–17.1)
LYMPH%: 21.3 % (ref 14.0–49.0)
MCH: 30.8 pg (ref 27.2–33.4)
MCHC: 35.5 g/dL (ref 32.0–36.0)
MCV: 86.7 fL (ref 79.3–98.0)
MONO#: 0.6 10*3/uL (ref 0.1–0.9)
MONO%: 10.6 % (ref 0.0–14.0)
NEUT#: 3.6 10*3/uL (ref 1.5–6.5)
NEUT%: 66.6 % (ref 39.0–75.0)
PLATELETS: 167 10*3/uL (ref 140–400)
RBC: 5.04 10*6/uL (ref 4.20–5.82)
RDW: 12.8 % (ref 11.0–14.6)
WBC: 5.4 10*3/uL (ref 4.0–10.3)
lymph#: 1.2 10*3/uL (ref 0.9–3.3)
nRBC: 0 % (ref 0–0)

## 2013-05-14 NOTE — Progress Notes (Signed)
  [facility] OFFICE PROGRESS NOTE   Diagnosis: Non-Hodgkin's lymphoma  INTERVAL HISTORY:   [name] returns for a scheduled visit. He feels well. He underwent resection of the left thigh mass at [facility] in November of 2014. The pathology confirmed a schwannoma. He reports numbness in the left leg prior to surgery. This has improved. No left leg weakness. No palpable lymph nodes.  Objective:  Vital signs in last 24 hours:  BP  153/90, pulse 65   HEENT: Oropharynx without visible mass, neck without mass Lymphatics:  No cervical, supra-clavicular, axillary, or inguinal nodes Resp: Lungs clear bilaterally Cardio: Regular rate and rhythm GI: No hepatosplenomegaly Vascular: No leg edema Neuro: The motor exam appears intact in the legs bilaterally  Skin: Left posterior thigh scar without evidence of recurrent tumor   Portacath/PICC-without erythema  Lab Results:  Lab Results  Component Value Date   WBC 4.4 10/30/2012   HGB 15.5 10/30/2012   HCT 42.8 10/30/2012   MCV 86.6 10/30/2012   PLT 153 10/30/2012   NEUTROABS 2.8 09/29/2012     Medications: I have reviewed the patient's current medications.  Assessment/Plan: 1. 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 He entered clinical remission following chemotherapy and remained in remission when he was seen at the cancer center 02/24/2009. 2. 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 Staging PET scan 08/23/2010 with increased FDG activity at the left tonsillar fossa and no additional evidence of lymphoma. He completed 4 cycles of R-ICE with cycle #1 beginning on 09/19/2010 and cycle #4 on 11/21/2010. Repeat head and neck examination by Dr. Constance Holster following R-ICE/rituximab showed no residual  lymphoma. He began radiation consolidation on 12/18/2010, radiation was completed on 01/22/2011. 3. History of neutropenia secondary to rituximab. 4. Anemia secondary to chemotherapy, status post a red blood cell transfusion 11/30/2010. The hemoglobin has normalized. 5. History of Mild thrombocytopenia secondary to chemotherapy. 6. Hypertension.\ 7.    Left thigh mass-status post surgical excision [facility] confirming a schwannoma  Disposition:  [name] remains in clinical remission from non-Hodgkin's lymphoma. He will return for an office visit and CBC in 6 months. I encouraged him to followup with Dr. Laurann Montana for management of hypertension.  Ladell Pier, MD  05/14/2013  2:01 PM

## 2013-05-14 NOTE — Progress Notes (Signed)
BP elevated during office visit. Pt reports he has an electric cuff at home. Recommended he schedule appt with PCP, check BP daily until visit. Take readings and cuff to office visit with PCP. He voiced understanding.

## 2013-05-15 ENCOUNTER — Telehealth: Payer: Self-pay | Admitting: Oncology

## 2013-05-15 NOTE — Telephone Encounter (Signed)
s.w. pt and advised on OCT appt....pt ok and aware °

## 2013-09-15 ENCOUNTER — Telehealth: Payer: Self-pay | Admitting: *Deleted

## 2013-09-15 NOTE — Telephone Encounter (Signed)
Pt called reports "I've had a lot of mucous in my throat x1 month now; sore throat; don't feel any lumps but I want to see if Dr. [name] can see me next week if possible because I'm worried"  Pt states everything else is fine; denies N/V/fever; no night sweats; eating/drinking well.  Pt traveling this week but will be back in town next week. Next appt scheduled for 11/16/13.   Note to Dr. [name].

## 2013-09-16 ENCOUNTER — Telehealth: Payer: Self-pay | Admitting: Oncology

## 2013-09-16 NOTE — Telephone Encounter (Signed)
Per Dr. [name]; notified pt that MD can see him Monday 09/21/13 @ 11:30; call if any questions.

## 2013-09-16 NOTE — Telephone Encounter (Signed)
, °

## 2013-09-21 ENCOUNTER — Ambulatory Visit (HOSPITAL_BASED_OUTPATIENT_CLINIC_OR_DEPARTMENT_OTHER): Payer: Managed Care, Other (non HMO) | Admitting: Oncology

## 2013-09-21 ENCOUNTER — Encounter (INDEPENDENT_AMBULATORY_CARE_PROVIDER_SITE_OTHER): Payer: Self-pay

## 2013-09-21 ENCOUNTER — Telehealth: Payer: Self-pay | Admitting: Oncology

## 2013-09-21 VITALS — BP 158/93 | HR 81 | Temp 98.2°F | Resp 19 | Ht 68.0 in | Wt 188.3 lb

## 2013-09-21 DIAGNOSIS — C8589 Other specified types of non-Hodgkin lymphoma, extranodal and solid organ sites: Secondary | ICD-10-CM

## 2013-09-21 DIAGNOSIS — C8590 Non-Hodgkin lymphoma, unspecified, unspecified site: Secondary | ICD-10-CM

## 2013-09-21 NOTE — Progress Notes (Signed)
  [facility] OFFICE PROGRESS NOTE   Diagnosis: Non-Hodgkin's lymphoma  INTERVAL HISTORY:   Mr. Mcbain returns prior to a scheduled visit. He reports having a sore throat and soreness in the left jugular region approximately 3 months ago. This has resolved. No associated symptoms. No fever. No palpable lymph nodes. He has pain in the left great toe. The pain is intermittent. He can feel a "popping" at the toe joint.  Objective:  Vital signs in last 24 hours:  Blood pressure 158/93, pulse 81, temperature 98.2 F (36.8 C), temperature source Oral, resp. rate 19, height 5\' 8"  (1.727 m), weight 188 lb 4.8 oz (85.412 kg), SpO2 100.00%. repeat manual blood pressure 150/90    HEENT: Oropharynx without visible mass, neck without mass. The extraoral canals are clear. Good light reflex bilaterally. Lymphatics: No cervical, supraclavicular, axillary, or inguinal nodes Resp: Lungs clear bilaterally Cardio: Regular rate and rhythm GI: No hepatosplenomegaly Vascular: No leg edema Musculoskeletal: There is crepitance with flexion/extension at the IP joint of the left great toe. No erythema or induration.     Lab Results:  Lab Results  Component Value Date   WBC 5.4 05/14/2013   HGB 15.5 05/14/2013   HCT 43.7 05/14/2013   MCV 86.7 05/14/2013   PLT 167 05/14/2013   NEUTROABS 3.6 05/14/2013     Medications: I have reviewed the patient's current medications.  Assessment/Plan: 1. 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 He entered clinical remission following chemotherapy and remained in remission when he was seen at the cancer center 02/24/2009. 2. 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 Staging PET scan 08/23/2010 with increased FDG activity at the  left tonsillar fossa and no additional evidence of lymphoma. He completed 4 cycles of R-ICE with cycle #1 beginning on 09/19/2010 and cycle #4 on 11/21/2010. Repeat head and neck examination by Dr. Constance Holster following R-ICE/rituximab showed no residual lymphoma. He began radiation consolidation on 12/18/2010, radiation was completed on 01/22/2011. 3. History of neutropenia secondary to rituximab. 4. Anemia secondary to chemotherapy, status post a red blood cell transfusion 11/30/2010. The hemoglobin has normalized. 5. History of Mild thrombocytopenia secondary to chemotherapy. 6. Hypertension.       7.   Left thigh mass-status post surgical excision [facility] November 2014 confirming a schwannoma        8.   left toe pain-likely arthritic    Disposition:  He remains in clinical remission from non-Hodgkin's lymphoma. He will contact us for persistent throat or neck symptoms. I suspect the left great toe discomfort is related to arthritis. He does not appear to have an acute inflammatory process such as gallop. I recommended he schedule an appointment with Dr. Laurann Montana for management of hypertension.  [name], MD  09/21/2013  12:55 PM

## 2013-09-21 NOTE — Telephone Encounter (Signed)
gv pt appt schedule for nov. lb/fu for oct cxd - per pt oct visit not needed and new orders given for 61mo f/u per 8/31 pof.

## 2013-11-16 ENCOUNTER — Other Ambulatory Visit: Payer: Self-pay

## 2013-11-16 ENCOUNTER — Ambulatory Visit: Payer: Self-pay | Admitting: Oncology

## 2013-12-21 ENCOUNTER — Ambulatory Visit (HOSPITAL_BASED_OUTPATIENT_CLINIC_OR_DEPARTMENT_OTHER): Payer: Managed Care, Other (non HMO) | Admitting: Oncology

## 2013-12-21 ENCOUNTER — Telehealth: Payer: Self-pay | Admitting: Oncology

## 2013-12-21 VITALS — BP 155/94 | HR 78 | Temp 98.2°F | Resp 18 | Ht 68.0 in | Wt 195.3 lb

## 2013-12-21 DIAGNOSIS — Z23 Encounter for immunization: Secondary | ICD-10-CM

## 2013-12-21 DIAGNOSIS — C8590 Non-Hodgkin lymphoma, unspecified, unspecified site: Secondary | ICD-10-CM

## 2013-12-21 MED ORDER — INFLUENZA VAC SPLIT QUAD 0.5 ML IM SUSY
0.5000 mL | PREFILLED_SYRINGE | Freq: Once | INTRAMUSCULAR | Status: AC
  Administered 2013-12-21: 0.5 mL via INTRAMUSCULAR
  Filled 2013-12-21: qty 0.5

## 2013-12-21 NOTE — Progress Notes (Signed)
  [place] OFFICE PROGRESS NOTE   Diagnosis: Non-Hodgkin's lymphoma  INTERVAL HISTORY:   He returns as scheduled. He had a recent cold. He no longer has toe pain. No palpable lymph nodes. Good appetite. No other complaint.  Objective:  Vital signs in last 24 hours:  Blood pressure 155/94, pulse 78, temperature 98.2 F (36.8 C), temperature source Oral, resp. rate 18, height 5\' 8"  (1.727 m), weight 195 lb 4.8 oz (88.587 kg), SpO2 100 %.    HEENT: Pharynx without exudate or mass. Neck without mass. Lymphatics: No cervical, supraclavicular, axillary, or inguinal nodes Resp: Lungs clear bilaterally Cardio: Regular rate and rhythm GI: No hepatosplenomegaly Vascular: No leg edema  Skin: Left posterior thigh scar without evidence of recurrent tumor     Lab Results:  Lab Results  Component Value Date   WBC 5.4 05/14/2013   HGB 15.5 05/14/2013   HCT 43.7 05/14/2013   MCV 86.7 05/14/2013   PLT 167 05/14/2013   NEUTROABS 3.6 05/14/2013     Medications: I have reviewed the patient's current medications.  Assessment/Plan: 1. 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 He entered clinical remission following chemotherapy and remained in remission when he was seen at the cancer center 02/24/2009. 2. 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 Staging PET scan 08/23/2010 with increased FDG activity at the left tonsillar fossa and no additional evidence of lymphoma. He completed 4 cycles of R-ICE with cycle #1 beginning on 09/19/2010 and cycle #4 on 11/21/2010. Repeat head and neck examination by Dr. Constance Holster following R-ICE/rituximab showed no residual lymphoma. He began radiation consolidation on 12/18/2010, radiation was completed on  01/22/2011. 3. History of neutropenia secondary to rituximab. 4. Anemia secondary to chemotherapy, status post a red blood cell transfusion 11/30/2010. The hemoglobin has normalized. 5. History of Mild thrombocytopenia secondary to chemotherapy. 6. Hypertension.  7. Left thigh mass-status post surgical excision [facility] confirming a schwannoma     Disposition:  Mr. Souder remains in remission from non-Hodgkin's lymphoma. He received an influenza vaccine today. He will return for an office visit in 6 months. I encouraged him to follow-up with Dr. Laurann Montana for management of hypertension.  [name], MD  12/21/2013  4:34 PM

## 2013-12-21 NOTE — Telephone Encounter (Signed)
Gave avs & cal for May 2016. °

## 2013-12-21 NOTE — Patient Instructions (Signed)
[facility] Discharge Instructions  EXAM FINDINGS BY THE PHYSICIAN TODAY AND SIGNS OR SYMPTOMS TO REPORT TO CLINIC OR PRIMARY PHYSICIAN:   MEDICATIONS PRESCRIBED:   Flu vaccine administered  SPECIAL INSTRUCTIONS/FOLLOW-UP:  Thank you for choosing [facility] to provide your oncology and hematology care.  To afford each patient quality time with our providers, please arrive at least 30 minutes before your scheduled appointment time.  With your help, our goal is to use those 30 minutes to complete the necessary work-up to ensure our physicians have the information they need to help with your evaluation and healthcare recommendations.     ___________________  Should you have questions after your visit to [facility], please contact our office at [phone] between the hours of 8:30 a.m. and 4:30 p.m.  Voicemails left after 4:00 p.m. will not be returned until the following business day.  For prescription refill requests, have your pharmacy contact our office with your prescription refill request. We request 24 hour notice for all refill requests.

## 2014-03-05 ENCOUNTER — Other Ambulatory Visit: Payer: Self-pay | Admitting: Internal Medicine

## 2014-03-05 ENCOUNTER — Ambulatory Visit
Admission: RE | Admit: 2014-03-05 | Discharge: 2014-03-05 | Disposition: A | Discharge status: Home / Self Care | Payer: Managed Care, Other (non HMO) | Source: Ambulatory Visit | Attending: Internal Medicine | Admitting: Internal Medicine

## 2014-03-05 DIAGNOSIS — R52 Pain, unspecified: Secondary | ICD-10-CM

## 2014-06-10 ENCOUNTER — Telehealth: Payer: Self-pay | Admitting: Oncology

## 2014-06-10 NOTE — Telephone Encounter (Signed)
Patient called to r/s 5/23 lab/BS to 6/16. Patient has new date/time.

## 2014-06-14 ENCOUNTER — Ambulatory Visit: Payer: Self-pay | Admitting: Oncology

## 2014-06-14 ENCOUNTER — Other Ambulatory Visit: Payer: Self-pay

## 2014-07-08 ENCOUNTER — Other Ambulatory Visit: Payer: Self-pay

## 2014-07-08 ENCOUNTER — Telehealth: Payer: Self-pay | Admitting: Oncology

## 2014-07-08 ENCOUNTER — Ambulatory Visit: Payer: Self-pay | Admitting: Oncology

## 2014-08-10 ENCOUNTER — Other Ambulatory Visit: Payer: Self-pay

## 2014-08-10 ENCOUNTER — Ambulatory Visit: Payer: Self-pay | Admitting: Oncology

## 2014-08-11 ENCOUNTER — Telehealth: Payer: Self-pay | Admitting: Oncology

## 2014-08-11 NOTE — Telephone Encounter (Signed)
Pt called lft msg stating he had to r/s, lft msg for pt confirming labs/ov and mailed out schedule to pt... KJ

## 2014-09-02 ENCOUNTER — Other Ambulatory Visit (HOSPITAL_BASED_OUTPATIENT_CLINIC_OR_DEPARTMENT_OTHER): Payer: Managed Care, Other (non HMO)

## 2014-09-02 ENCOUNTER — Ambulatory Visit (HOSPITAL_BASED_OUTPATIENT_CLINIC_OR_DEPARTMENT_OTHER): Payer: Managed Care, Other (non HMO) | Admitting: Oncology

## 2014-09-02 VITALS — BP 133/82 | HR 60 | Temp 98.8°F | Resp 18 | Ht 68.0 in | Wt 196.4 lb

## 2014-09-02 DIAGNOSIS — Z8572 Personal history of non-Hodgkin lymphomas: Secondary | ICD-10-CM

## 2014-09-02 DIAGNOSIS — C8590 Non-Hodgkin lymphoma, unspecified, unspecified site: Secondary | ICD-10-CM

## 2014-09-02 DIAGNOSIS — Z23 Encounter for immunization: Secondary | ICD-10-CM

## 2014-09-02 LAB — CBC WITH DIFFERENTIAL/PLATELET
BASO%: 0.6 % (ref 0.0–2.0)
Basophils Absolute: 0 10*3/uL (ref 0.0–0.1)
EOS ABS: 0.1 10*3/uL (ref 0.0–0.5)
EOS%: 1.4 % (ref 0.0–7.0)
HCT: 43 % (ref 38.4–49.9)
HGB: 15.2 g/dL (ref 13.0–17.1)
LYMPH#: 1.2 10*3/uL (ref 0.9–3.3)
LYMPH%: 24.7 % (ref 14.0–49.0)
MCH: 31.1 pg (ref 27.2–33.4)
MCHC: 35.3 g/dL (ref 32.0–36.0)
MCV: 88.1 fL (ref 79.3–98.0)
MONO#: 0.6 10*3/uL (ref 0.1–0.9)
MONO%: 12.7 % (ref 0.0–14.0)
NEUT%: 60.6 % (ref 39.0–75.0)
NEUTROS ABS: 3 10*3/uL (ref 1.5–6.5)
Platelets: 153 10*3/uL (ref 140–400)
RBC: 4.88 10*6/uL (ref 4.20–5.82)
RDW: 12.7 % (ref 11.0–14.6)
WBC: 5 10*3/uL (ref 4.0–10.3)
nRBC: 0 % (ref 0–0)

## 2014-09-02 NOTE — Progress Notes (Signed)
  [place] OFFICE PROGRESS NOTE   Diagnosis: Non-Hodgkin's lymphoma  INTERVAL HISTORY:   Mr. Ferns returns as scheduled. He feels well. He is playing tennis and cough. No fever, night sweats, or palpable lymph nodes. No recent infection. He is now taking atenolol for hypertension.  Objective:  Vital signs in last 24 hours:  Blood pressure 133/82, pulse 60, temperature 98.8 F (37.1 C), temperature source Oral, resp. rate 18, height 5\' 8"  (1.727 m), weight 196 lb 6.4 oz (89.086 kg), SpO2 98 %.    HEENT: Oropharynx without visible mass, neck without mass Lymphatics: No cervical, supraclavicular, axillary, or inguinal nodes Resp: Lungs clear bilaterally Cardio: Regular rate and rhythm GI: No hepatomegaly, nontender, no mass Vascular: No leg edema   Lab Results:  Lab Results  Component Value Date   WBC 5.0 09/02/2014   HGB 15.2 09/02/2014   HCT 43.0 09/02/2014   MCV 88.1 09/02/2014   PLT 153 09/02/2014   NEUTROABS 3.0 09/02/2014     Medications: I have reviewed the patient's current medications.  Assessment/Plan: 1. 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 He entered clinical remission following chemotherapy and remained in remission when he was seen at the cancer center 02/24/2009. 2. 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 Staging PET scan 08/23/2010 with increased FDG activity at the left tonsillar fossa and no additional evidence of lymphoma. He completed 4 cycles of R-ICE with cycle #1 beginning on 09/19/2010 and cycle #4 on 11/21/2010. Repeat head and neck examination by Dr. Constance Holster following R-ICE/rituximab showed no residual lymphoma. He began radiation consolidation on 12/18/2010, radiation was completed on  01/22/2011. 3. History of neutropenia secondary to rituximab. 4. Anemia secondary to chemotherapy, status post a red blood cell transfusion 11/30/2010. The hemoglobin has normalized. 5. History of Mild thrombocytopenia secondary to chemotherapy. 6. Hypertension.  7. 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 Blank remains in clinical remission from non-Hodgkin's,. He will return for an office visit in 6 months. We will recommended he remain up-to-date only influenza and pneumonia vaccines.  [name], MD  09/02/2014  4:21 PM

## 2014-09-03 ENCOUNTER — Telehealth: Payer: Self-pay | Admitting: Oncology

## 2014-09-03 NOTE — Telephone Encounter (Signed)
Called and left a message with the 35month appointment

## 2015-03-03 ENCOUNTER — Ambulatory Visit: Payer: Self-pay | Admitting: Oncology

## 2015-03-03 ENCOUNTER — Other Ambulatory Visit: Payer: Self-pay

## 2015-09-30 ENCOUNTER — Telehealth: Payer: Self-pay | Admitting: Oncology

## 2015-09-30 NOTE — Telephone Encounter (Signed)
lvm to inform pt of Oct appt per LOS

## 2015-10-24 ENCOUNTER — Ambulatory Visit: Payer: Self-pay | Admitting: Oncology

## 2015-10-24 ENCOUNTER — Other Ambulatory Visit: Payer: Self-pay

## 2015-11-09 ENCOUNTER — Telehealth: Payer: Self-pay | Admitting: *Deleted

## 2015-11-09 DIAGNOSIS — C8590 Non-Hodgkin lymphoma, unspecified, unspecified site: Secondary | ICD-10-CM

## 2015-11-09 NOTE — Telephone Encounter (Signed)
Message from pt requesting office visit next week. Denies any new issues, traveling to [place] 11/6 and would like to be seen prior to trip.  Reviewed with Dr. [name]: Order received for office visit and lab on 11/2 0800 with Dr. [name].  Called pt with appt, he will be out of town that day. Requests lab next week and to be called for work-in with Dr. [name] if slot opens up on 11/3. Reviewed with Dr. [name]: Will see pt 11/3 @ 0800. Message to schedulers.

## 2015-11-25 ENCOUNTER — Telehealth: Payer: Self-pay | Admitting: *Deleted

## 2015-11-25 ENCOUNTER — Telehealth: Payer: Self-pay | Admitting: Oncology

## 2015-11-25 ENCOUNTER — Ambulatory Visit: Payer: Self-pay

## 2015-11-25 ENCOUNTER — Ambulatory Visit (HOSPITAL_BASED_OUTPATIENT_CLINIC_OR_DEPARTMENT_OTHER): Payer: Managed Care, Other (non HMO) | Admitting: Oncology

## 2015-11-25 ENCOUNTER — Other Ambulatory Visit (HOSPITAL_BASED_OUTPATIENT_CLINIC_OR_DEPARTMENT_OTHER): Payer: Managed Care, Other (non HMO)

## 2015-11-25 VITALS — BP 148/89 | HR 67 | Temp 98.7°F | Resp 18 | Ht 68.0 in | Wt 200.7 lb

## 2015-11-25 DIAGNOSIS — Z8572 Personal history of non-Hodgkin lymphomas: Secondary | ICD-10-CM

## 2015-11-25 DIAGNOSIS — C8590 Non-Hodgkin lymphoma, unspecified, unspecified site: Secondary | ICD-10-CM

## 2015-11-25 DIAGNOSIS — C8331 Diffuse large B-cell lymphoma, lymph nodes of head, face, and neck: Secondary | ICD-10-CM

## 2015-11-25 LAB — COMPREHENSIVE METABOLIC PANEL
ALT: 35 U/L (ref 0–55)
ANION GAP: 9 meq/L (ref 3–11)
AST: 23 U/L (ref 5–34)
Albumin: 3.8 g/dL (ref 3.5–5.0)
Alkaline Phosphatase: 28 U/L — ABNORMAL LOW (ref 40–150)
BILIRUBIN TOTAL: 0.67 mg/dL (ref 0.20–1.20)
BUN: 17 mg/dL (ref 7.0–26.0)
CO2: 24 meq/L (ref 22–29)
CREATININE: 0.9 mg/dL (ref 0.7–1.3)
Calcium: 8.9 mg/dL (ref 8.4–10.4)
Chloride: 108 mEq/L (ref 98–109)
EGFR: >90 mL/min/{1.73_m2} (ref >90)
Glucose: 100 mg/dl (ref 70–140)
Potassium: 4.2 mEq/L (ref 3.5–5.1)
Sodium: 141 mEq/L (ref 136–145)
TOTAL PROTEIN: 6.7 g/dL (ref 6.4–8.3)

## 2015-11-25 LAB — CBC WITH DIFFERENTIAL/PLATELET
BASO%: 0.9 % (ref 0.0–2.0)
Basophils Absolute: 0 10*3/uL (ref 0.0–0.1)
EOS%: 2.3 % (ref 0.0–7.0)
Eosinophils Absolute: 0.1 10*3/uL (ref 0.0–0.5)
HEMATOCRIT: 45.9 % (ref 38.4–49.9)
HGB: 15.4 g/dL (ref 13.0–17.1)
LYMPH#: 0.9 10*3/uL (ref 0.9–3.3)
LYMPH%: 19.1 % (ref 14.0–49.0)
MCH: 29.5 pg (ref 27.2–33.4)
MCHC: 33.6 g/dL (ref 32.0–36.0)
MCV: 87.7 fL (ref 79.3–98.0)
MONO#: 0.7 10*3/uL (ref 0.1–0.9)
MONO%: 14 % (ref 0.0–14.0)
NEUT%: 63.7 % (ref 39.0–75.0)
NEUTROS ABS: 3 10*3/uL (ref 1.5–6.5)
Platelets: 160 10*3/uL (ref 140–400)
RBC: 5.23 10*6/uL (ref 4.20–5.82)
RDW: 13.1 % (ref 11.0–14.6)
WBC: 4.8 10*3/uL (ref 4.0–10.3)

## 2015-11-25 NOTE — Telephone Encounter (Signed)
Patient refused copy of AVS report and appointment schedule, per 11/25/15 los. Will review on MyChart. Appointments scheduled per 11/25/15 los.Marland Kitchen

## 2015-11-25 NOTE — Progress Notes (Signed)
  [place] OFFICE PROGRESS NOTE   Diagnosis: Non-Hodgkin's lymphoma  INTERVAL HISTORY:   [name] returns after missing the last scheduled office visit. He reports feeling well in general. Good appetite. No palpable lymph nodes. He has "sweats "during meetings at work approximately once per month. No night sweats, fever, or anorexia. He has pain with movement at the right shoulder. He saw Dr. Laurann Lee earlier this week and has been referred to orthopedics.  Objective:  Vital signs in last 24 hours:  Blood pressure (!) 148/89, pulse 67, temperature 98.7 F (37.1 C), temperature source Oral, resp. rate 18, height 5\' 8"  (1.727 m), weight 200 lb 11.2 oz (91 kg), SpO2 100 %.    HEENT: Oropharynx without visible mass, neck without mass Lymphatics: No cervical, supraclavicular, or inguinal nodes. Prominent left greater than right axillary fat pads versus soft mobile 1-2 cm nodes Resp: Lungs clear bilaterally Cardio: Regular rate and rhythm GI: No hepatosplenomegaly, no mass, nontender Vascular: No leg edema Musculoskeletal: Pain with abduction and posterior extension at the right shoulder    Lab Results:  Lab Results  Component Value Date   WBC 4.8 11/25/2015   HGB 15.4 11/25/2015   HCT 45.9 11/25/2015   MCV 87.7 11/25/2015   PLT 160 11/25/2015   NEUTROABS 3.0 11/25/2015     Medications: I have reviewed the patient's current medications.  Assessment/Plan: 1. 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 He entered clinical remission following chemotherapy and remained in remission when he was seen at the cancer center 02/24/2009. 2. 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 Staging PET scan 08/23/2010 with increased FDG activity at  the left tonsillar fossa and no additional evidence of lymphoma. He completed 4 cycles of R-ICE with cycle #1 beginning on 09/19/2010 and cycle #4 on 11/21/2010. Repeat head and neck examination by Dr. Constance Lee following R-ICE/rituximab showed no residual lymphoma. He began radiation consolidation on 12/18/2010, radiation was completed on 01/22/2011. 3. History of neutropenia secondary to rituximab. 4. Anemia secondary to chemotherapy, status post a red blood cell transfusion 11/30/2010. The hemoglobin has normalized. 5. History of Mild thrombocytopenia secondary to chemotherapy. 6. Hypertension.  7. 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 He will return for an office visit in one-year. He reports receiving an influenza vaccine with Dr. Laurann Lee earlier this week. I suspect the right shoulder discomfort is related to a benign musculoskeletal condition such as a rotator cuff injury.  Don Coder, MD  11/25/2015  9:17 AM

## 2015-11-25 NOTE — Telephone Encounter (Signed)
Left message on voicemail informing pt of normal labs. Copy routed to PCP.

## 2015-11-25 NOTE — Telephone Encounter (Signed)
-----   Message from Ladell Pier, MD sent at 11/25/2015  5:31 PM EDT ----- Please call patient, labs are normal, copy to Dr. [name]

## 2016-02-22 ENCOUNTER — Telehealth: Payer: Self-pay | Admitting: *Deleted

## 2016-02-22 NOTE — Telephone Encounter (Signed)
"  Pass on to Dr, [name] that I had a cold two weeks ago.  I could feel large, painful lymph nodes.  The cold has dissipated but other nodes are present.  I have two in my neck, two under my arm and groin I can feel easily.  Is this a concern?  Do I wait until cold dissipates completely to see if lymph nodes go down?  Please call me [phone]."

## 2016-02-22 NOTE — Telephone Encounter (Signed)
Verbal order received and read back from Dr. [name] for [name] to wait until cold has completely dissipated.  Call if swollen lymph nodes still present to be scheduled for check up.  Order given to [name]'s Identified voicemail at this time.

## 2016-02-23 ENCOUNTER — Other Ambulatory Visit: Payer: Self-pay | Admitting: Internal Medicine

## 2016-02-23 ENCOUNTER — Ambulatory Visit
Admission: RE | Admit: 2016-02-23 | Discharge: 2016-02-23 | Disposition: A | Discharge status: Home / Self Care | Payer: Managed Care, Other (non HMO) | Source: Ambulatory Visit | Attending: Internal Medicine | Admitting: Internal Medicine

## 2016-02-23 DIAGNOSIS — R591 Generalized enlarged lymph nodes: Secondary | ICD-10-CM

## 2016-02-23 DIAGNOSIS — R599 Enlarged lymph nodes, unspecified: Secondary | ICD-10-CM

## 2016-03-07 ENCOUNTER — Telehealth: Payer: Self-pay

## 2016-03-07 NOTE — Telephone Encounter (Signed)
Patient called worried about his lymph nodes Dr. [name] mentioned at last appt. States he went to his PCP and doesn't have an infection, is requesting to see Dr. [name] this Friday. Per MD only availability is Thursday at 1030. Called to see if pt could come in tomorrow at 1030, pt unsure and will call back.   Patient called back and states he can come in tomorrow at 1030. Pt will be added to schedule.

## 2016-03-08 ENCOUNTER — Ambulatory Visit (HOSPITAL_BASED_OUTPATIENT_CLINIC_OR_DEPARTMENT_OTHER): Payer: Managed Care, Other (non HMO) | Admitting: Oncology

## 2016-03-08 ENCOUNTER — Telehealth: Payer: Self-pay | Admitting: Oncology

## 2016-03-08 VITALS — BP 120/75 | HR 67 | Temp 99.3°F | Resp 20 | Ht 68.0 in | Wt 200.0 lb

## 2016-03-08 DIAGNOSIS — Z8572 Personal history of non-Hodgkin lymphomas: Secondary | ICD-10-CM | POA: Diagnosis not present

## 2016-03-08 DIAGNOSIS — R591 Generalized enlarged lymph nodes: Secondary | ICD-10-CM

## 2016-03-08 DIAGNOSIS — C8331 Diffuse large B-cell lymphoma, lymph nodes of head, face, and neck: Secondary | ICD-10-CM

## 2016-03-08 NOTE — Progress Notes (Signed)
  [place] OFFICE PROGRESS NOTE   Diagnosis: Non-Hodgkin's lymphoma  INTERVAL HISTORY:   [name] returns prior to a scheduled visit. He reports developing an upper respiratory tract infection with a cough and fever last month. He saw Dr. Laurann Montana to 02/22/2013 and was prescribed Levaquin. An influenza test was negative. He reports developing enlarged neck lymph nodes with the upper respiratory infections that have improved. However he has developed axillary and inguinal lymph nodes. He feels better than he did a few weeks ago, but continues to have malaise. He reports intermittent night sweats. No anorexia. He is working.  Objective:  Vital signs in last 24 hours:  Blood pressure 120/75, pulse 67, temperature 99.3 F (37.4 C), temperature source Oral, resp. rate 20, height 5\' 8"  (1.727 m), weight 200 lb (90.7 kg), SpO2 99 %.    HEENT: Oropharynx without visible mass Lymphatics: Enlarged bilateral cervical, submandibular, scalene, axillary, inguinal, and femoral nodes. The largest nodes are 4-5 centimeter axillary nodes. Resp: Lungs clear bilaterally Cardio: Regular rate and rhythm GI: No hepatosplenomegaly, no mass, nontender Vascular: No leg edema   Lab Results:  Labs from [place] on 02/23/2016-. 0.93, bilirubin 1.1, hemoglobin 14.4, platelets 170,000, white count 4.8, ANC 3.1, reveals slight sedimentation rate-16 Medications: I have reviewed the patient's current medications.  Assessment/Plan: 1. 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 He entered clinical remission following chemotherapy and remained in remission when he was seen at the cancer center 02/24/2009. 2. 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 Staging PET  scan 08/23/2010 with increased FDG activity at the left tonsillar fossa and no additional evidence of lymphoma. He completed 4 cycles of R-ICE with cycle #1 beginning on 09/19/2010 and cycle #4 on 11/21/2010. Repeat head and neck examination by Dr. Constance Holster following R-ICE/rituximab showed no residual lymphoma. He began radiation consolidation on 12/18/2010, radiation was completed on 01/22/2011. 3. History of neutropenia secondary to rituximab. 4. Anemia secondary to chemotherapy, status post a red blood cell transfusion 11/30/2010. The hemoglobin has normalized. 5. History of Mild thrombocytopenia secondary to chemotherapy. 6. Hypertension.  7. 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 Cumba had a recent upper respiratory infection. He now has diffuse palpable lymphadenopathy. I am very suspicious his presentation is related to recurrence of lymphoma. I contacted Dr. Constance Holster. He will see Mr. Kitzmann and arrange an excisional biopsy of a neck lymph node. He will be scheduled for a staging PET scan.  Mr. Styles will be scheduled for an office visit 03/16/2016. He will contact us in the interim or new symptoms.  25 minutes were spent with the patient today. The majority of the time was used for counseling and coordination of care.  [name], MD  03/08/2016  11:35 AM

## 2016-03-08 NOTE — Telephone Encounter (Signed)
Appointments scheduled per 2/15 LOS. Patient given AVS report and calendars with future scheduled appointments. °

## 2016-03-09 ENCOUNTER — Telehealth: Payer: Self-pay | Admitting: *Deleted

## 2016-03-09 NOTE — Telephone Encounter (Signed)
Spoke with pt, informed him PET [name] is pending. He reports he saw Dr. Constance Holster this afternoon- biopsy may be scheduled for 2/21. They will call with biopsy appointment.

## 2016-03-14 ENCOUNTER — Other Ambulatory Visit: Payer: Self-pay | Admitting: Otolaryngology

## 2016-03-14 ENCOUNTER — Other Ambulatory Visit (HOSPITAL_COMMUNITY)
Admission: RE | Admit: 2016-03-14 | Discharge: 2016-03-14 | Disposition: A | Discharge status: Home / Self Care | Payer: Managed Care, Other (non HMO) | Source: Ambulatory Visit | Attending: Otolaryngology | Admitting: Otolaryngology

## 2016-03-14 DIAGNOSIS — C8590 Non-Hodgkin lymphoma, unspecified, unspecified site: Secondary | ICD-10-CM

## 2016-03-14 HISTORY — DX: Non-Hodgkin lymphoma, unspecified, unspecified site: C85.90

## 2016-03-16 ENCOUNTER — Telehealth: Payer: Self-pay | Admitting: Nurse Practitioner

## 2016-03-16 ENCOUNTER — Ambulatory Visit (HOSPITAL_BASED_OUTPATIENT_CLINIC_OR_DEPARTMENT_OTHER): Payer: 59 | Admitting: Nurse Practitioner

## 2016-03-16 ENCOUNTER — Ambulatory Visit (HOSPITAL_BASED_OUTPATIENT_CLINIC_OR_DEPARTMENT_OTHER): Payer: 59

## 2016-03-16 VITALS — BP 122/70 | HR 78 | Temp 98.8°F | Resp 18 | Ht 68.0 in | Wt 201.1 lb

## 2016-03-16 DIAGNOSIS — Z8572 Personal history of non-Hodgkin lymphomas: Secondary | ICD-10-CM

## 2016-03-16 DIAGNOSIS — R599 Enlarged lymph nodes, unspecified: Secondary | ICD-10-CM | POA: Diagnosis not present

## 2016-03-16 DIAGNOSIS — C8590 Non-Hodgkin lymphoma, unspecified, unspecified site: Secondary | ICD-10-CM

## 2016-03-16 DIAGNOSIS — R61 Generalized hyperhidrosis: Secondary | ICD-10-CM | POA: Diagnosis not present

## 2016-03-16 LAB — COMPREHENSIVE METABOLIC PANEL WITH GFR
ALT: 62 U/L — ABNORMAL HIGH (ref 0–55)
AST: 49 U/L — ABNORMAL HIGH (ref 5–34)
Albumin: 3.7 g/dL (ref 3.5–5.0)
Alkaline Phosphatase: 68 U/L (ref 40–150)
Anion Gap: 12 meq/L — ABNORMAL HIGH (ref 3–11)
BUN: 13.6 mg/dL (ref 7.0–26.0)
CO2: 28 meq/L (ref 22–29)
Calcium: 9.9 mg/dL (ref 8.4–10.4)
Chloride: 99 meq/L (ref 98–109)
Creatinine: 1.3 mg/dL (ref 0.7–1.3)
EGFR: 62 ml/min/1.73 m2 — ABNORMAL LOW
Glucose: 107 mg/dL (ref 70–140)
Potassium: 4.3 meq/L (ref 3.5–5.1)
Sodium: 139 meq/L (ref 136–145)
Total Bilirubin: 1.52 mg/dL — ABNORMAL HIGH (ref 0.20–1.20)
Total Protein: 7 g/dL (ref 6.4–8.3)

## 2016-03-16 LAB — CBC WITH DIFFERENTIAL/PLATELET
BASO%: 0.6 % (ref 0.0–2.0)
Basophils Absolute: 0 10*3/uL (ref 0.0–0.1)
EOS%: 1.4 % (ref 0.0–7.0)
Eosinophils Absolute: 0.1 10*3/uL (ref 0.0–0.5)
HCT: 43.9 % (ref 38.4–49.9)
HEMOGLOBIN: 15.1 g/dL (ref 13.0–17.1)
LYMPH#: 0.3 10*3/uL — AB (ref 0.9–3.3)
LYMPH%: 8.8 % — ABNORMAL LOW (ref 14.0–49.0)
MCH: 29.8 pg (ref 27.2–33.4)
MCHC: 34.4 g/dL (ref 32.0–36.0)
MCV: 86.6 fL (ref 79.3–98.0)
MONO#: 0.5 10*3/uL (ref 0.1–0.9)
MONO%: 15 % — AB (ref 0.0–14.0)
NEUT#: 2.6 10*3/uL (ref 1.5–6.5)
NEUT%: 74.2 % (ref 39.0–75.0)
PLATELETS: 149 10*3/uL (ref 140–400)
RBC: 5.07 10*6/uL (ref 4.20–5.82)
RDW: 13.4 % (ref 11.0–14.6)
WBC: 3.5 10*3/uL — AB (ref 4.0–10.3)

## 2016-03-16 LAB — URIC ACID: Uric Acid, Serum: 8.6 mg/dl — ABNORMAL HIGH (ref 2.6–7.4)

## 2016-03-16 NOTE — Progress Notes (Addendum)
[place] OFFICE PROGRESS NOTE   Diagnosis:  Non-Hodgkin's lymphoma  INTERVAL HISTORY:   Mr. Lare returns as scheduled. He continues to have sweats. No associated fever. He feels like his voice sounds different. He has mild dyspnea on exertion. He notes wheezing periodically. Chest and abdomen feel "bloated". He thinks the palpable lymph nodes are larger. No nausea or vomiting. No dysphagia. Bowels are moving. Overall good appetite.  Objective:  Vital signs in last 24 hours:  Blood pressure 122/70, pulse 78, temperature 98.8 F (37.1 C), temperature source Oral, resp. rate 18, height 5\' 8"  (1.727 m), weight 201 lb 1.6 oz (91.2 kg), SpO2 99 %.    HEENT: Face is flushed. No thrush or ulcers. Oropharynx without visible mass. Lymphatics: Enlarged bilateral cervical, scalene, axillary, inguinal and femoral nodes. Largest nodes are in the axillary and femoral regions. Biopsy site at the right neck is without surrounding erythema. Resp: Initial faint wheeze left upper lung field. Lungs clear on follow-up exam. No respiratory distress. Cardio: Regular rate and rhythm. GI: Abdomen is somewhat distended with a palpable fullness at the left mid to lower abdomen. No organomegaly. Vascular: Trace lower leg edema bilaterally. Skin: Diaphoretic.    Lab Results:  Lab Results  Component Value Date   WBC 4.8 11/25/2015   HGB 15.4 11/25/2015   HCT 45.9 11/25/2015   MCV 87.7 11/25/2015   PLT 160 11/25/2015   NEUTROABS 3.0 11/25/2015    Imaging:  No results found.  Medications: I have reviewed the patient's current medications.  Assessment/Plan: 1. 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 He entered clinical remission following chemotherapy and remained in remission when he was seen at the cancer center 02/24/2009. 2. 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 Staging PET scan 08/23/2010 with increased FDG activity at the left tonsillar fossa and no additional evidence of lymphoma. He completed 4 cycles of R-ICE with cycle #1 beginning on 09/19/2010 and cycle #4 on 11/21/2010. Repeat head and neck examination by Dr. Constance Holster following R-ICE/rituximab showed no residual lymphoma. He began radiation consolidation on 12/18/2010, radiation was completed on 01/22/2011. 3. History of neutropenia secondary to rituximab. 4. Anemia secondary to chemotherapy, status post a red blood cell transfusion 11/30/2010. The hemoglobin has normalized. 5. History of Mild thrombocytopenia secondary to chemotherapy. 6. Hypertension. 7. Left thigh mass-status post surgical excision [facility] November 2014 confirming a schwannoma 8. Diffuse palpable lymphadenopathy, sweats; status post biopsy right cervical adenopathy 03/14/2016 with preliminary pathology consistent with T-cell lymphoma.   Disposition: The preliminary pathology from the right cervical lymph node biopsy is consistent with T-cell lymphoma. He is symptomatic with lymphadenopathy, sweats. The PET scan has not been approved by insurance. We will try to obtain approval as soon as possible. We are obtaining baseline labs today.   He will begin Ibuprofen or Aleve twice daily.   We scheduled a return visit on 03/19/2016 to discuss the final pathology findings and establish a treatment plan. Mr. Sprigg understands to seek emergency evaluation over the weekend if he develops any respiratory or other worrisome symptoms.  Patient seen with Dr. [name]. 25 minutes were spent face to face at today's visit with the majority of that time involved in counseling/coordination of care.     [name] ANP/GNP-BC   03/16/2016  2:47 PM  This was a shared visit with [name]. Mr. Kapple was interviewed and examined.  The preliminary pathology from the cervical and a biopsy consistent with a T-cell lymphoma. I discussed the case with the pathologist, the transplant service at [facility], and the [place] service at [facility].  He is symptomatic with palpable lymphadenopathy in sweats. He will be referred for a PET scan ASAP. He will begin twice daily ibuprofen or Aleve for help with the sweats. He will be scheduled for a consult at [facility] next week with the plan to initiate systemic therapy as soon as possible.  Julieanne Manson, M.D.

## 2016-03-16 NOTE — Telephone Encounter (Signed)
Gave Patient [name] and calendar for Feb. To see [name] 2/26 1045 am.

## 2016-03-18 ENCOUNTER — Other Ambulatory Visit: Payer: Self-pay | Admitting: Nurse Practitioner

## 2016-03-18 DIAGNOSIS — C8590 Non-Hodgkin lymphoma, unspecified, unspecified site: Secondary | ICD-10-CM

## 2016-03-18 MED ORDER — ALLOPURINOL 300 MG PO TABS: 300.0000 mg | ORAL_TABLET | Freq: Every day | ORAL | 0 refills | Status: DC

## 2016-03-19 ENCOUNTER — Encounter: Payer: Self-pay | Admitting: Nurse Practitioner

## 2016-03-19 ENCOUNTER — Ambulatory Visit (HOSPITAL_BASED_OUTPATIENT_CLINIC_OR_DEPARTMENT_OTHER): Payer: 59 | Admitting: Nurse Practitioner

## 2016-03-19 ENCOUNTER — Encounter: Payer: Self-pay | Admitting: *Deleted

## 2016-03-19 ENCOUNTER — Inpatient Hospital Stay
Admission: AD | Admit: 2016-03-19 | Payer: Managed Care, Other (non HMO) | Source: Ambulatory Visit | Admitting: Oncology

## 2016-03-19 DIAGNOSIS — C8590 Non-Hodgkin lymphoma, unspecified, unspecified site: Secondary | ICD-10-CM

## 2016-03-19 DIAGNOSIS — C866 Primary cutaneous CD30-positive T-cell proliferations: Secondary | ICD-10-CM | POA: Diagnosis not present

## 2016-03-19 DIAGNOSIS — Z8572 Personal history of non-Hodgkin lymphomas: Secondary | ICD-10-CM

## 2016-03-19 LAB — LACTATE DEHYDROGENASE: LDH: 562 U/L — ABNORMAL HIGH (ref 125–245)

## 2016-03-19 NOTE — Progress Notes (Signed)
DISCONTINUE OFF PATHWAY REGIMEN - Lymphoma and CLL  No Medical Intervention - Off Treatment.  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

## 2016-03-19 NOTE — Progress Notes (Signed)
Office note from 03/16/16 faxed to Rosanna Randy at [phone] per order of Dr. [name].

## 2016-03-19 NOTE — Progress Notes (Addendum)
[facility] OFFICE PROGRESS NOTE   Diagnosis:  Non-Hodgkin's lymphoma  INTERVAL HISTORY:   [name] returns as scheduled. He thinks the neck is less swollen. He continues to note a fullness in the chest and abdomen. No dysphagia. He had an episode of "choking and coughing" over the weekend. This resolved when he drank some water. He is having low-grade fevers. He continues to have night sweats. No nausea or vomiting. He notes early satiety. No shortness of breath. No significant cough. No diarrhea. No hematuria or dysuria. No unusual headaches. No vision change. No falls. No numbness or tingling in his hands or feet. Palpable adenopathy is unchanged.  Objective:  Vital signs in last 24 hours:  Blood pressure 136/76, pulse 71, temperature 99.3 F (37.4 C), temperature source Oral, resp. rate 18, weight 198 lb 9.6 oz (90.1 kg), SpO2 99 %.    HEENT: No thrush or ulcers. Oropharynx without visible mass. Lymphatics: Enlarged bilateral cervical, scalene, axillary, inguinal and femoral nodes. Biopsy site at the right neck is without surrounding erythema. Resp: Lungs clear bilaterally. Cardio: Regular rate and rhythm. GI: Abdomen is distended with fullness at the left lower to mid abdomen. Vascular: No leg edema. Neuro: Alert and oriented. Follows commands. Motor strength 5 over 5. Knee DTRs 2+, symmetric. Finger to nose and rapid alternating movement intact.  Skin: Faint erythema over the lower abdominal wall.    Lab Results:  Lab Results  Component Value Date   WBC 3.5 (L) 03/16/2016   HGB 15.1 03/16/2016   HCT 43.9 03/16/2016   MCV 86.6 03/16/2016   PLT 149 03/16/2016   NEUTROABS 2.6 03/16/2016    Medications: I have reviewed the patient's current medications.  Assessment/Plan: 1. 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 He entered clinical remission  following chemotherapy and remained in remission when he was seen at the cancer center 02/24/2009. 2. 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 Staging PET scan 08/23/2010 with increased FDG activity at the left tonsillar fossa and no additional evidence of lymphoma. He completed 4 cycles of R-ICE with cycle #1 beginning on 09/19/2010 and cycle #4 on 11/21/2010. Repeat head and neck examination by Dr. Pollyann Kennedy following R-ICE/rituximab showed no residual lymphoma. He began radiation consolidation on 12/18/2010, radiation was completed on 01/22/2011. 3. History of neutropenia secondary to rituximab. 4. Anemia secondary to chemotherapy, status post a red blood cell transfusion 11/30/2010. The hemoglobin has normalized. 5. History of Mild thrombocytopenia secondary to chemotherapy. 6. Hypertension. 7. Left thigh mass-status post surgical excision [facility] November 2014 confirming a schwannoma 8. Diffuse palpable lymphadenopathy, sweats; status post biopsy right cervical adenopathy 03/14/2016 with pathology confirming involvement by T-cell lymphoma with the differential including a peripheral T-cell lymphoma, NOS with expression of CD30 versus an ALK negative anaplastic large cell lymphoma; CD3 and CD43 positive, Ki-67 with an elevated proliferation rate.    Disposition: Mr. Sackmann appears unchanged. He continues to be symptomatic with palpable adenopathy and fever/sweats. The final pathology report confirms T-cell lymphoma. Dr. Truett Perna discussed the pathology report with Dr. Berneice Heinrich. We reviewed the pathology report with Mr. Kolodziejski and his wife at today's visit. Dr. Truett Perna has spoken with Dr. Aris Lot at [facility]. The plan is to proceed with [facility] as soon as possible.  We reviewed potential toxicities associated with the chemotherapy  including myelosuppression, allergic reaction, nausea/vomiting,  mouth sores, diarrhea or constipation, hair loss. We discussed the possibility of treatment related secondary leukemia. We discussed the neuropathy associated with vincristine. We discussed the cardiac toxicity associated with Adriamycin (plan to add Zinecard after cycle 2). We discussed the possibility of hemorrhagic cystitis with Cytoxan.  We are referring him for an urgent staging PET scan. He will have a staging bone marrow biopsy. He will need a 2-D echo. We are referring him to interventional radiology for placement of a Port-A-Cath.  We are making a referral to Dr. Lanier Ensign at [facility].   The plan is to admit Mr. Stieg to the hospital this week to proceed with cycle 1 EPOCH. He will be scheduled for Neulasta the day following hospital discharge.  We will arrange outpatient follow-up pending timing of the above.   Patient seen with Dr. [name]. 50 minutes were spent face-to-face at today's visit with the majority of that time involved in counseling/coordination of care.   [name] ANP/GNP-BC   03/19/2016  12:19 PM This was a shared visit with [name]. I discussed the case with the pathology service today. The lymph node biopsy is consistent with involvement by a T-cell lymphoma, NOS. I discussed the case with the lymphoma service at [facility]. They agree with [facility] chemotherapy. The plan is to obtain a baseline PET scan to be repeated after 2 cycles of chemotherapy. He may be a candidate for antibody therapy in the future based on the CD30 positivity.  We reviewed the potential toxicities associated with the [facility] regimen with Mr. Minus and his wife. He agrees to proceed. He will be admitted within the next one to 2 days for initiation of systemic chemotherapy. He will be referred for a staging bone marrow biopsy, echocardiogram, and Port-A-Cath placement. He was started on allopurinol prophylaxis 03/18/2016.  Julieanne Manson, M.D.

## 2016-03-19 NOTE — Progress Notes (Signed)
Lymphoma and CLL - No Medical Intervention - Off Treatment.  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

## 2016-03-20 ENCOUNTER — Other Ambulatory Visit (HOSPITAL_BASED_OUTPATIENT_CLINIC_OR_DEPARTMENT_OTHER): Payer: 59

## 2016-03-20 ENCOUNTER — Other Ambulatory Visit: Payer: Self-pay | Admitting: Nurse Practitioner

## 2016-03-20 ENCOUNTER — Encounter (HOSPITAL_COMMUNITY)
Admission: RE | Admit: 2016-03-20 | Discharge: 2016-03-20 | Disposition: A | Discharge status: Home / Self Care | Payer: 59 | Source: Ambulatory Visit | Attending: Oncology | Admitting: Oncology

## 2016-03-20 DIAGNOSIS — C8590 Non-Hodgkin lymphoma, unspecified, unspecified site: Secondary | ICD-10-CM

## 2016-03-20 DIAGNOSIS — C8331 Diffuse large B-cell lymphoma, lymph nodes of head, face, and neck: Secondary | ICD-10-CM

## 2016-03-20 LAB — COMPREHENSIVE METABOLIC PANEL
ALT: 151 U/L — AB (ref 0–55)
AST: 88 U/L — AB (ref 5–34)
Albumin: 3.8 g/dL (ref 3.5–5.0)
Alkaline Phosphatase: 72 U/L (ref 40–150)
Anion Gap: 10 mEq/L (ref 3–11)
BUN: 14.7 mg/dL (ref 7.0–26.0)
CALCIUM: 10.2 mg/dL (ref 8.4–10.4)
CHLORIDE: 103 meq/L (ref 98–109)
CO2: 30 mEq/L — ABNORMAL HIGH (ref 22–29)
CREATININE: 1.1 mg/dL (ref 0.7–1.3)
EGFR: 77 mL/min/{1.73_m2} — ABNORMAL LOW (ref >90)
GLUCOSE: 105 mg/dL (ref 70–140)
POTASSIUM: 4.9 meq/L (ref 3.5–5.1)
Sodium: 142 mEq/L (ref 136–145)
Total Bilirubin: 1.48 mg/dL — ABNORMAL HIGH (ref 0.20–1.20)
Total Protein: 7.1 g/dL (ref 6.4–8.3)

## 2016-03-20 LAB — GLUCOSE, CAPILLARY: Glucose-Capillary: 98 mg/dL (ref 65–99)

## 2016-03-20 MED ORDER — FLUDEOXYGLUCOSE F - 18 (FDG) INJECTION
9.6000 | Freq: Once | INTRAVENOUS | Status: AC | PRN
  Administered 2016-03-20: 9.6 via INTRAVENOUS

## 2016-03-21 ENCOUNTER — Other Ambulatory Visit: Payer: Self-pay | Admitting: Oncology

## 2016-03-21 ENCOUNTER — Encounter (HOSPITAL_COMMUNITY): Payer: Self-pay

## 2016-03-21 ENCOUNTER — Other Ambulatory Visit: Payer: Self-pay | Admitting: Nurse Practitioner

## 2016-03-21 ENCOUNTER — Telehealth: Payer: Self-pay

## 2016-03-21 ENCOUNTER — Ambulatory Visit (HOSPITAL_COMMUNITY)
Admission: RE | Admit: 2016-03-21 | Discharge: 2016-03-21 | Disposition: A | Discharge status: Home / Self Care | Payer: 59 | Source: Ambulatory Visit | Attending: Oncology | Admitting: Oncology

## 2016-03-21 ENCOUNTER — Other Ambulatory Visit: Payer: Self-pay | Admitting: Radiology

## 2016-03-21 ENCOUNTER — Ambulatory Visit (HOSPITAL_COMMUNITY)
Admission: RE | Admit: 2016-03-21 | Discharge: 2016-03-21 | Disposition: A | Discharge status: Home / Self Care | Payer: 59 | Source: Ambulatory Visit | Attending: Nurse Practitioner | Admitting: Nurse Practitioner

## 2016-03-21 DIAGNOSIS — Z79899 Other long term (current) drug therapy: Secondary | ICD-10-CM | POA: Insufficient documentation

## 2016-03-21 DIAGNOSIS — C8440 Peripheral T-cell lymphoma, not classified, unspecified site: Secondary | ICD-10-CM

## 2016-03-21 DIAGNOSIS — Z880 Allergy status to penicillin: Secondary | ICD-10-CM

## 2016-03-21 DIAGNOSIS — Z9889 Other specified postprocedural states: Secondary | ICD-10-CM | POA: Insufficient documentation

## 2016-03-21 DIAGNOSIS — I10 Essential (primary) hypertension: Secondary | ICD-10-CM

## 2016-03-21 DIAGNOSIS — C8590 Non-Hodgkin lymphoma, unspecified, unspecified site: Secondary | ICD-10-CM

## 2016-03-21 DIAGNOSIS — Z923 Personal history of irradiation: Secondary | ICD-10-CM | POA: Insufficient documentation

## 2016-03-21 DIAGNOSIS — R05 Cough: Secondary | ICD-10-CM | POA: Insufficient documentation

## 2016-03-21 HISTORY — PX: IR GENERIC HISTORICAL: IMG1180011

## 2016-03-21 LAB — CBC WITH DIFFERENTIAL/PLATELET
Basophils Absolute: 0 10*3/uL (ref 0.0–0.1)
Basophils Relative: 0 %
EOS ABS: 0.1 10*3/uL (ref 0.0–0.7)
Eosinophils Relative: 1 %
HCT: 42.9 % (ref 39.0–52.0)
Hemoglobin: 14.9 g/dL (ref 13.0–17.0)
LYMPHS ABS: 0.5 10*3/uL — AB (ref 0.7–4.0)
Lymphocytes Relative: 13 %
MCH: 29.6 pg (ref 26.0–34.0)
MCHC: 34.7 g/dL (ref 30.0–36.0)
MCV: 85.1 fL (ref 78.0–100.0)
Monocytes Absolute: 0.6 10*3/uL (ref 0.1–1.0)
Monocytes Relative: 16 %
Neutro Abs: 2.8 10*3/uL (ref 1.7–7.7)
Neutrophils Relative %: 70 %
Platelets: 166 10*3/uL (ref 150–400)
RBC: 5.04 MIL/uL (ref 4.22–5.81)
RDW: 13.4 % (ref 11.5–15.5)
WBC: 4 10*3/uL (ref 4.0–10.5)

## 2016-03-21 LAB — PROTIME-INR
INR: 1.1
PROTHROMBIN TIME: 14.3 s (ref 11.4–15.2)

## 2016-03-21 MED ORDER — HEPARIN SOD (PORK) LOCK FLUSH 100 UNIT/ML IV SOLN
INTRAVENOUS | Status: AC
  Filled 2016-03-21: qty 5

## 2016-03-21 MED ORDER — LIDOCAINE-EPINEPHRINE (PF) 2 %-1:200000 IJ SOLN
INTRAMUSCULAR | Status: AC | PRN
  Administered 2016-03-21: 20 mL

## 2016-03-21 MED ORDER — SODIUM CHLORIDE 0.9 % IV SOLN
INTRAVENOUS | Status: DC
  Administered 2016-03-21: 12:00:00 via INTRAVENOUS

## 2016-03-21 MED ORDER — VANCOMYCIN HCL IN DEXTROSE 1-5 GM/200ML-% IV SOLN
1000.0000 mg | INTRAVENOUS | Status: AC
  Administered 2016-03-21: 1000 mg via INTRAVENOUS

## 2016-03-21 MED ORDER — VANCOMYCIN HCL IN DEXTROSE 1-5 GM/200ML-% IV SOLN
INTRAVENOUS | Status: AC
  Filled 2016-03-21: qty 200

## 2016-03-21 MED ORDER — MIDAZOLAM HCL 2 MG/2ML IJ SOLN
INTRAMUSCULAR | Status: AC
  Filled 2016-03-21: qty 6

## 2016-03-21 MED ORDER — FENTANYL CITRATE (PF) 100 MCG/2ML IJ SOLN
INTRAMUSCULAR | Status: AC
  Filled 2016-03-21: qty 4

## 2016-03-21 MED ORDER — FENTANYL CITRATE (PF) 100 MCG/2ML IJ SOLN
INTRAMUSCULAR | Status: AC | PRN
  Administered 2016-03-21: 50 ug via INTRAVENOUS
  Administered 2016-03-21 (×2): 25 ug via INTRAVENOUS

## 2016-03-21 MED ORDER — LIDOCAINE-EPINEPHRINE 2 %-1:200000 IJ SOLN
INTRAMUSCULAR | Status: AC
Start: 2016-03-21 — End: 2016-03-22
  Filled 2016-03-21: qty 20

## 2016-03-21 MED ORDER — MIDAZOLAM HCL 2 MG/2ML IJ SOLN
INTRAMUSCULAR | Status: AC | PRN
  Administered 2016-03-21 (×4): 1 mg via INTRAVENOUS

## 2016-03-21 NOTE — Procedures (Signed)
R IJ Port cathter placement with US and fluoroscopy No complication No blood loss. See complete dictation in [facility] P[facility].  

## 2016-03-21 NOTE — H&P (Signed)
Referring Physician(s): Sherrill,B  Supervising Physician: Arne Cleveland  Patient Status:  [facility]  Chief Complaint: "I'm getting another port"  Subjective: Patient familiar to IR service from prior right chest wall Port-A-Cath placement in August 2012 for large B cell lymphoma with subsequent removal in December 2012 secondary to completion of treatment. He also had prior left  thigh mass biopsy in 2014 by our service with subsequent excision revealing schwannoma. 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 He has had some mild temperature elevations, sweats, occasional cough. Past Medical History:  Diagnosis Date  . History of radiation therapy 12/18/10 to 01/22/11   left oropharynx  . Hypertension   . Neutropenia    Secondary to Rituxan  . Non Hodgkin's lymphoma ([place])    s/p 4 cycles R-ICE, start 11/21/10  . T-cell lymphoma ([place]) 03/14/2016   Past Surgical History:  Procedure Laterality Date  . BIOPSY PHARYNX     09/20/10 excision right posterior pharynx  . BONE MARROW ASPIRATION      09/08/10 Biopsy , and clot, left iliac crest  . PORTACATH PLACEMENT  09/19/10  . TONSILLECTOMY     Bilateral      Allergies: Penicillins  Medications: Prior to Admission medications   Medication Sig Start Date End Date Taking? Authorizing Provider  allopurinol (ZYLOPRIM) 300 MG tablet Take 1 tablet (300 mg total) by mouth daily. 03/18/16  Yes [name], NP  atenolol (TENORMIN) 50 MG tablet Take 50 mg by mouth daily. 08/19/14  Yes Historical Provider, MD  naproxen (NAPROSYN) 250 MG tablet Take 250 mg by mouth 2 (two) times daily with a meal.   Yes Historical Provider, MD  ibuprofen (ADVIL,MOTRIN) 200 MG tablet Take 200 mg by mouth every 12 (twelve) hours as needed. For pain    Historical Provider,  MD  promethazine (PHENERGAN) 25 MG suppository Place 25 mg rectally. 03/12/16 03/22/16  Historical Provider, MD     Vital Signs: BP 128/81 (BP Location: Left Arm)   Pulse 87   Temp 97.9 F (36.6 C) (Oral)   Resp 18   SpO2 98%   Physical Exam Awake, alert.Noted enlarged bilateral cervical, axillary, inguinal and femoral nodes;chest clear to auscultation bilaterally. Heart with regular rate and rhythm. 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 EXAM: NUCLEAR MEDICINE PET SKULL BASE TO THIGH TECHNIQUE: 9.6 mCi F-18 FDG was injected intravenously. Full-ring PET imaging was performed from the skull base to thigh after the radiotracer. CT data was obtained and used for attenuation correction and anatomic localization. FASTING BLOOD GLUCOSE:  Value: 94 mg/dl COMPARISON:  08/23/2010 FINDINGS: NECK Hypermetabolic lymph nodes at levels Ia. IIa, IIb, III, IV, Va, Vb, as well as level Ia on the right. There are intraglandular lymph nodes in the parotid glands as well as a lymph node within or along the right pterygoid musculature. An index station IIa lymph node just posterior to the right mandible on image 25/4 measures 1.7 cm in short axis and has a maximum standard uptake value of 11.1 CHEST Extensive bilateral axillary and subpectoral adenopathy along with paratracheal, paraesophageal, subcarinal, right infrahilar, and right lower internal mammary adenopathy observed. An index  large right axillary lymph node measures 3.4 cm in short axis on image 58/4 and has a maximum SUV of 13.2. There is also a small hypermetabolic focus in the right infraspinatus muscle, likely due to an adjacent lymph node along the deep surface of the muscle. ABDOMEN/PELVIS Gastrohepatic ligament, peripancreatic,  right retrocrural, porta hepatis, retroperitoneal/ periaortic, retrocaval, portacaval, common iliac, internal iliac, external iliac, and inguinal adenopathy noted. An index left periaortic lymph node measuring 2.1 cm on image 127/4 has a maximum standard uptake value of 14.1. There also some scattered mesenteric lymph nodes, a hypermetabolic focus along the left rectus abdominus muscle, a hypermetabolic focus in the right psoas muscle, a hypermetabolic focus along the right posterior pelvic floor, and a possible hypermetabolic focus medial to the left proximal femur on the bottom most images. Diffuse mildly increased splenic activity, maximum SUV 5.1. Several fluid density hepatic lesions are identified. Mild splenomegaly. SKELETON No definite abnormal osseous activity. REFERENCE VALUES: Background mediastinal blood pool activity:  3.1 Background hepatic activity:  3.7 IMPRESSION: 1. Extensive bulky hypermetabolic nodal activity in the neck, chest, abdomen, and pelvis as detailed above, [place] 5. 2. Mild splenomegaly with diffusely mildly accentuated splenic activity. Electronically Signed   By: Van Clines M.D.   On: 03/20/2016 13:32    Labs:  CBC:  Recent Labs  11/25/15 0810 03/16/16 1633  WBC 4.8 3.5*  HGB 15.4 15.1  HCT 45.9 43.9  PLT 160 149    COAGS: No results for input(s): INR, APTT in the last 8760 hours.  BMP:  Recent Labs  11/25/15 0848 03/16/16 1633 03/20/16 0958  NA 141 139 142  K 4.2 4.3 4.9  CO2 24 28 30*  GLUCOSE 100 107 105  BUN 17.0 13.6 14.7  CALCIUM 8.9 9.9 10.2  CREATININE 0.9 1.3 1.1    LIVER FUNCTION TESTS:  Recent Labs  11/25/15 0848 03/16/16 1633 03/20/16 0958  BILITOT 0.67 1.52* 1.48*  AST 23 49* 88*  ALT 35 62* 151*  ALKPHOS 28* 68 72  PROT 6.7 7.0 7.1  ALBUMIN 3.8 3.7 3.8    Assessment and Plan: Patient with hx large B cell lymphoma initially diagnosed in 2005 with prior treatment and subsequent recurrence in 2012; also with  history of left thigh schwannoma resection in 2014; now with newly diagnosed T cell lymphoma.He presents today for Port-A-Cath placement for planned chemotherapy.Risks and benefits discussed with the patient/spouse including, but not limited to bleeding, infection, pneumothorax, or fibrin sheath development and need for additional procedures.All of the patient's questions were answered, patient is agreeable to proceed.Consent signed and in chart.     Electronically Signed: D. Rowe Robert 03/21/2016, 12:00 PM   I spent a total of 20 minutes at the the patient's bedside AND on the patient's hospital floor or unit, greater than 50% of which was counseling/coordinating care for Port-A-Cath placement

## 2016-03-21 NOTE — Discharge Instructions (Signed)
Implanted Port Home Guide °An implanted port is a type of central line that is placed under the skin. Central lines are used to provide IV access when treatment or nutrition needs to be given through a person’s veins. Implanted ports are used for long-term IV access. An implanted port may be placed because: °· You need IV medicine that would be irritating to the small veins in your hands or arms. °· You need long-term IV medicines, such as antibiotics. °· You need IV nutrition for a long period. °· You need frequent blood draws for lab tests. °· You need dialysis. °Implanted ports are usually placed in the chest area, but they can also be placed in the upper arm, the abdomen, or the leg. An implanted port has two main parts: °· Reservoir. The reservoir is round and will appear as a small, raised area under your skin. The reservoir is the part where a needle is inserted to give medicines or draw blood. °· Catheter. The catheter is a thin, flexible tube that extends from the reservoir. The catheter is placed into a large vein. Medicine that is inserted into the reservoir goes into the catheter and then into the vein. °How will I care for my incision site? °Do not get the incision site wet. Bathe or shower as directed by your health care provider. °How is my port accessed? °Special steps must be taken to access the port: °· Before the port is accessed, a numbing cream can be placed on the skin. This helps numb the skin over the port site. °· Your health care provider uses a sterile technique to access the port. °¨ Your health care provider must put on a mask and sterile gloves. °¨ The skin over your port is cleaned carefully with an antiseptic and allowed to dry. °¨ The port is gently pinched between sterile gloves, and a needle is inserted into the port. °· Only "non-coring" port needles should be used to access the port. Once the port is accessed, a blood return should be checked. This helps ensure that the port is  in the vein and is not clogged. °· If your port needs to remain accessed for a constant infusion, a clear (transparent) bandage will be placed over the needle site. The bandage and needle will need to be changed every week, or as directed by your health care provider. °· Keep the bandage covering the needle clean and dry. Do not get it wet. Follow your health care provider’s instructions on how to take a shower or bath while the port is accessed. °· If your port does not need to stay accessed, no bandage is needed over the port. °What is flushing? °Flushing helps keep the port from getting clogged. Follow your health care provider’s instructions on how and when to flush the port. Ports are usually flushed with saline solution or a medicine called heparin. The need for flushing will depend on how the port is used. °· If the port is used for intermittent medicines or blood draws, the port will need to be flushed: °¨ After medicines have been given. °¨ After blood has been drawn. °¨ As part of routine maintenance. °· If a constant infusion is running, the port may not need to be flushed. °How long will my port stay implanted? °The port can stay in for as long as your health care provider thinks it is needed. When it is time for the port to come out, surgery will be done to remove it.   The procedure is similar to the one performed when the port was put in. °When should I seek immediate medical care? °When you have an implanted port, you should seek immediate medical care if: °· You notice a bad smell coming from the incision site. °· You have swelling, redness, or drainage at the incision site. °· You have more swelling or pain at the port site or the surrounding area. °· You have a fever that is not controlled with medicine. °This information is not intended to replace advice given to you by your health care provider. Make sure you discuss any questions you have with your health care provider. °Document Released:  01/08/2005 Document Revised: 06/16/2015 Document Reviewed: 09/15/2012 °Elsevier Interactive Patient Education © 2017 [employer] °Implanted Port Insertion, Care After °This sheet gives you information about how to care for yourself after your procedure. Your health care provider may also give you more specific instructions. If you have problems or questions, contact your health care provider. °What can I expect after the procedure? °After your procedure, it is common to have: °· Discomfort at the port insertion site. °· Bruising on the skin over the port. This should improve over 3-4 days. °Follow these instructions at home: °Port care  °· After your port is placed, you will get a manufacturer's information card. The card has information about your port. Keep this card with you at all times. °· Take care of the port as told by your health care provider. Ask your health care provider if you or a family member can get training for taking care of the port at home. A home health care nurse may also take care of the port. °· Make sure to remember what type of port you have. °Incision care  °· Follow instructions from your health care provider about how to take care of your port insertion site. Make sure you: °¨ Wash your hands with soap and water before you change your bandage (dressing). If soap and water are not available, use hand sanitizer. °¨ Change your dressing as told by your health care provider. °¨ Leave stitches (sutures), skin glue, or adhesive strips in place. These skin closures may need to stay in place for 2 weeks or longer. If adhesive strip edges start to loosen and curl up, you may trim the loose edges. Do not remove adhesive strips completely unless your health care provider tells you to do that. °· Check your port insertion site every day for signs of infection. Check for: °¨ More redness, swelling, or pain. °¨ More fluid or blood. °¨ Warmth. °¨ Pus or a bad smell. °General instructions  °· Do not  take baths, swim, or use a hot tub until your health care provider approves. °· Do not lift anything that is heavier than 10 lb (4.5 kg) for a week, or as told by your health care provider. °· Ask your health care provider when it is okay to: °¨ Return to work or school. °¨ Resume usual physical activities or sports. °· Do not drive for 24 hours if you were given a medicine to help you relax (sedative). °· Take over-the-counter and prescription medicines only as told by your health care provider. °· Wear a medical alert bracelet in case of an emergency. This will tell any health care providers that you have a port. °· Keep all follow-up visits as told by your health care provider. This is important. °Contact a health care provider if: °· You cannot flush your port   with saline as directed, or you cannot draw blood from the port. °· You have a fever or chills. °· You have more redness, swelling, or pain around your port insertion site. °· You have more fluid or blood coming from your port insertion site. °· Your port insertion site feels warm to the touch. °· You have pus or a bad smell coming from the port insertion site. °Get help right away if: °· You have chest pain or shortness of breath. °· You have bleeding from your port that you cannot control. °Summary °· Take care of the port as told by your health care provider. °· Change your dressing as told by your health care provider. °· Keep all follow-up visits as told by your health care provider. °This information is not intended to replace advice given to you by your health care provider. Make sure you discuss any questions you have with your health care provider. °Document Released: 10/29/2012 Document Revised: 11/30/2015 Document Reviewed: 11/30/2015 °Elsevier Interactive Patient Education © 2017 [employer] °Moderate Conscious Sedation, Adult, Care After °These instructions provide you with information about caring for yourself after your procedure. Your  health care provider may also give you more specific instructions. Your treatment has been planned according to current medical practices, but problems sometimes occur. Call your health care provider if you have any problems or questions after your procedure. °What can I expect after the procedure? °After your procedure, it is common: °· To feel sleepy for several hours. °· To feel clumsy and have poor balance for several hours. °· To have poor judgment for several hours. °· To vomit if you eat too soon. °Follow these instructions at home: °For at least 24 hours after the procedure:  ° °· Do not: °¨ Participate in activities where you could fall or become injured. °¨ Drive. °¨ Use heavy machinery. °¨ Drink alcohol. °¨ Take sleeping pills or medicines that cause drowsiness. °¨ Make important decisions or sign legal documents. °¨ Take care of children on your own. °· Rest. °Eating and drinking  °· Follow the diet recommended by your health care provider. °· If you vomit: °¨ Drink water, juice, or soup when you can drink without vomiting. °¨ Make sure you have little or no nausea before eating solid foods. °General instructions  °· Have a responsible adult stay with you until you are awake and alert. °· Take over-the-counter and prescription medicines only as told by your health care provider. °· If you smoke, do not smoke without supervision. °· Keep all follow-up visits as told by your health care provider. This is important. °Contact a health care provider if: °· You keep feeling nauseous or you keep vomiting. °· You feel light-headed. °· You develop a rash. °· You have a fever. °Get help right away if: °· You have trouble breathing. °This information is not intended to replace advice given to you by your health care provider. Make sure you discuss any questions you have with your health care provider. °Document Released: 10/29/2012 Document Revised: 06/13/2015 Document Reviewed: 04/30/2015 °Elsevier Interactive  Patient Education © 2017 [employer] ° °

## 2016-03-21 NOTE — Telephone Encounter (Signed)
Patient called to see if he could have his bone marrow biopsy done today with his port placement, informed pt they were unable to schedule these together. Pt verbalized understanding and went over plan for admission tomorrow with possible echo tomorrow and bone marrow biopsy Friday. Pt understands admitting will call him and to call if he has any questions

## 2016-03-22 ENCOUNTER — Encounter (HOSPITAL_COMMUNITY): Payer: Self-pay

## 2016-03-22 ENCOUNTER — Ambulatory Visit (HOSPITAL_COMMUNITY): Payer: Self-pay

## 2016-03-22 ENCOUNTER — Inpatient Hospital Stay (HOSPITAL_COMMUNITY): Payer: 59

## 2016-03-22 ENCOUNTER — Inpatient Hospital Stay (HOSPITAL_COMMUNITY)
Admission: AD | Admit: 2016-03-22 | Discharge: 2016-03-27 | DRG: 847 | Disposition: A | Discharge status: Home / Self Care | Payer: 59 | Source: Ambulatory Visit | Attending: Oncology | Admitting: Oncology

## 2016-03-22 ENCOUNTER — Ambulatory Visit (HOSPITAL_COMMUNITY): Payer: 59

## 2016-03-22 DIAGNOSIS — Z79899 Other long term (current) drug therapy: Secondary | ICD-10-CM

## 2016-03-22 DIAGNOSIS — D6481 Anemia due to antineoplastic chemotherapy: Secondary | ICD-10-CM | POA: Diagnosis not present

## 2016-03-22 DIAGNOSIS — T380X5A Adverse effect of glucocorticoids and synthetic analogues, initial encounter: Secondary | ICD-10-CM | POA: Diagnosis present

## 2016-03-22 DIAGNOSIS — R739 Hyperglycemia, unspecified: Secondary | ICD-10-CM | POA: Diagnosis present

## 2016-03-22 DIAGNOSIS — Z923 Personal history of irradiation: Secondary | ICD-10-CM | POA: Diagnosis not present

## 2016-03-22 DIAGNOSIS — D6959 Other secondary thrombocytopenia: Secondary | ICD-10-CM | POA: Diagnosis not present

## 2016-03-22 DIAGNOSIS — I509 Heart failure, unspecified: Secondary | ICD-10-CM | POA: Diagnosis not present

## 2016-03-22 DIAGNOSIS — C8441 Peripheral T-cell lymphoma, not classified, lymph nodes of head, face, and neck: Secondary | ICD-10-CM | POA: Diagnosis present

## 2016-03-22 DIAGNOSIS — C8590 Non-Hodgkin lymphoma, unspecified, unspecified site: Secondary | ICD-10-CM | POA: Diagnosis not present

## 2016-03-22 DIAGNOSIS — Z5111 Encounter for antineoplastic chemotherapy: Principal | ICD-10-CM

## 2016-03-22 DIAGNOSIS — I10 Essential (primary) hypertension: Secondary | ICD-10-CM | POA: Diagnosis present

## 2016-03-22 DIAGNOSIS — T451X5A Adverse effect of antineoplastic and immunosuppressive drugs, initial encounter: Secondary | ICD-10-CM | POA: Diagnosis not present

## 2016-03-22 DIAGNOSIS — Z880 Allergy status to penicillin: Secondary | ICD-10-CM

## 2016-03-22 DIAGNOSIS — C8440 Peripheral T-cell lymphoma, not classified, unspecified site: Secondary | ICD-10-CM | POA: Diagnosis present

## 2016-03-22 LAB — ECHOCARDIOGRAM COMPLETE
HEIGHTINCHES: 68 in
Weight: 3120 oz

## 2016-03-22 LAB — CBC WITH DIFFERENTIAL/PLATELET
Basophils Absolute: 0 10*3/uL (ref 0.0–0.1)
Basophils Relative: 1 %
EOS ABS: 0.1 10*3/uL (ref 0.0–0.7)
EOS PCT: 3 %
HCT: 38.2 % — ABNORMAL LOW (ref 39.0–52.0)
Hemoglobin: 13.1 g/dL (ref 13.0–17.0)
LYMPHS PCT: 13 %
Lymphs Abs: 0.5 10*3/uL — ABNORMAL LOW (ref 0.7–4.0)
MCH: 29 pg (ref 26.0–34.0)
MCHC: 34.3 g/dL (ref 30.0–36.0)
MCV: 84.7 fL (ref 78.0–100.0)
MONOS PCT: 14 %
Monocytes Absolute: 0.6 10*3/uL (ref 0.1–1.0)
Neutro Abs: 2.8 10*3/uL (ref 1.7–7.7)
Neutrophils Relative %: 69 %
PLATELETS: 168 10*3/uL (ref 150–400)
RBC: 4.51 MIL/uL (ref 4.22–5.81)
RDW: 13.4 % (ref 11.5–15.5)
WBC: 4 10*3/uL (ref 4.0–10.5)

## 2016-03-22 LAB — URIC ACID: Uric Acid, Serum: 4.6 mg/dL (ref 4.4–7.6)

## 2016-03-22 LAB — COMPREHENSIVE METABOLIC PANEL
ALT: 119 U/L — AB (ref 17–63)
AST: 71 U/L — AB (ref 15–41)
Albumin: 3.6 g/dL (ref 3.5–5.0)
Alkaline Phosphatase: 59 U/L (ref 38–126)
Anion gap: 8 (ref 5–15)
BUN: 19 mg/dL (ref 6–20)
CHLORIDE: 102 mmol/L (ref 101–111)
CO2: 27 mmol/L (ref 22–32)
CREATININE: 1.17 mg/dL (ref 0.61–1.24)
Calcium: 9.4 mg/dL (ref 8.9–10.3)
GFR calc Af Amer: >60 mL/min (ref >60)
Glucose, Bld: 111 mg/dL — ABNORMAL HIGH (ref 65–99)
POTASSIUM: 3.9 mmol/L (ref 3.5–5.1)
Sodium: 137 mmol/L (ref 135–145)
Total Bilirubin: 1.1 mg/dL (ref 0.3–1.2)
Total Protein: 6.4 g/dL — ABNORMAL LOW (ref 6.5–8.1)

## 2016-03-22 LAB — LACTATE DEHYDROGENASE: LDH: 477 U/L — ABNORMAL HIGH (ref 98–192)

## 2016-03-22 MED ORDER — ALLOPURINOL 300 MG PO TABS
300.0000 mg | ORAL_TABLET | Freq: Every day | ORAL | Status: DC
  Administered 2016-03-22 – 2016-03-23 (×2): 300 mg via ORAL
  Filled 2016-03-22: qty 1

## 2016-03-22 MED ORDER — ACETAMINOPHEN 325 MG PO TABS: 650.0000 mg | ORAL_TABLET | Freq: Four times a day (QID) | ORAL | Status: DC | PRN

## 2016-03-22 MED ORDER — LORAZEPAM 0.5 MG PO TABS
0.5000 mg | ORAL_TABLET | Freq: Every evening | ORAL | Status: DC | PRN
  Administered 2016-03-23 – 2016-03-26 (×4): 0.5 mg via ORAL
  Filled 2016-03-22 (×4): qty 1

## 2016-03-22 MED ORDER — POLYETHYLENE GLYCOL 3350 17 G PO PACK
17.0000 g | PACK | Freq: Every day | ORAL | Status: DC | PRN
Start: 2016-03-22 — End: 2016-03-27

## 2016-03-22 MED ORDER — ONDANSETRON HCL 4 MG PO TABS
4.0000 mg | ORAL_TABLET | Freq: Four times a day (QID) | ORAL | Status: DC | PRN
  Administered 2016-03-26: 4 mg via ORAL
  Filled 2016-03-22: qty 1

## 2016-03-22 MED ORDER — SODIUM CHLORIDE 0.9% FLUSH: 10.0000 mL | INTRAVENOUS | Status: DC | PRN

## 2016-03-22 MED ORDER — ONDANSETRON HCL 4 MG/2ML IJ SOLN
4.0000 mg | Freq: Four times a day (QID) | INTRAMUSCULAR | Status: DC | PRN
  Administered 2016-03-27: 4 mg via INTRAVENOUS
  Filled 2016-03-22: qty 2

## 2016-03-22 MED ORDER — ENOXAPARIN SODIUM 40 MG/0.4ML ~~LOC~~ SOLN: 40.0000 mg | SUBCUTANEOUS | Status: DC

## 2016-03-22 MED ORDER — SODIUM CHLORIDE 0.9 % IV SOLN
INTRAVENOUS | Status: DC
  Administered 2016-03-22 – 2016-03-27 (×13): via INTRAVENOUS

## 2016-03-22 MED ORDER — ATENOLOL 50 MG PO TABS: 50.0000 mg | ORAL_TABLET | Freq: Every day | ORAL | Status: DC

## 2016-03-22 NOTE — H&P (Signed)
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 He recently developed diffuse palpable lymphadenopathy and sweats. Biopsy of right cervical adenopathy on 03/14/2016 showed T-cell lymphoma, CD30 positive, ALK negative. Staging PET scan 03/20/2016 showed extensive bulky hypermetabolic nodal activity in the neck, chest, abdomen and pelvis; mild splenomegaly. He had a Port-A-Cath placed yesterday. He is being admitted today for IV hydration with plans to begin cycle 1 EPOCH on 03/23/2016.  Past Medical History:  Diagnosis Date  . History of radiation therapy 12/18/10 to 01/22/11   left oropharynx  . Hypertension   . Neutropenia    Secondary to Rituxan  . Non Hodgkin's lymphoma ([place])    s/p 4 cycles R-ICE, start 11/21/10  . T-cell lymphoma ([place]) 03/14/2016    Past Surgical History:  Procedure Laterality Date  . BIOPSY PHARYNX     09/20/10 excision right posterior pharynx  . BONE MARROW ASPIRATION      09/08/10 Biopsy , and clot, left iliac crest  . IR GENERIC HISTORICAL  03/21/2016   IR FLUORO GUIDE PORT INSERTION RIGHT 03/21/2016 Don Cleveland, MD [facility]  . IR GENERIC HISTORICAL  03/21/2016   IR US GUIDE VASC ACCESS RIGHT 03/21/2016 Don Cleveland, MD [facility]  . PORTACATH PLACEMENT  09/19/10  . TONSILLECTOMY     Bilateral    Medications Prior to Admission  Medication Sig Dispense Refill  . acetaminophen (TYLENOL) 500 MG tablet Take 1,000 mg by mouth every 6 (six) hours as needed for mild pain, moderate pain, fever or headache.    . allopurinol (ZYLOPRIM) 300 MG tablet Take 1 tablet (300 mg total) by mouth daily. 10 tablet 0  . atenolol (TENORMIN) 50 MG tablet Take 50 mg by mouth daily.    [name] 575  MG/5ML Lee Take 5 mLs by mouth daily.    . naproxen sodium (ANAPROX) 220 MG tablet Take 220 mg by mouth every 12 (twelve) hours as needed (for pain/fever).     Scheduled Meds: . allopurinol  300 mg Oral Daily  . atenolol  50 mg Oral Daily  . enoxaparin (LOVENOX) injection  40 mg Subcutaneous Q24H   Continuous Infusions: . sodium chloride     PRN Meds:.acetaminophen, ondansetron **OR** ondansetron (ZOFRAN) IV, polyethylene glycol  Allergies  Allergen Reactions  . Penicillins Other (See Comments)    Childhood allergy reaction unknown  Has patient had a PCN reaction causing immediate rash, facial/tongue/throat swelling, SOB or lightheadedness with hypotension: Unknown Has patient had a PCN reaction causing severe rash involving mucus membranes or skin necrosis: Unknown Has patient had a PCN reaction that required hospitalization: Unknown Has patient had a PCN reaction occurring within the last 10 years: Unknown If all of the above answers are "NO", then may proceed with Cephalosporin use.     Family history: Noncontributory. There is no family history of cancer.  Social history: He lives in [place]. He is married. No tobacco use.  ROS: He continues to have fevers and sweats. No shaking chills. He has a good appetite but notes early satiety. Yesterday he noticed a new right cervical lymph node which is tender. Cough has worsened. He has intermittent wheezing and mild dyspnea on exertion. No chest pain. He notes increased pressure/fullness in the abdomen. Bowels are moving.  No nausea or vomiting. He has noted recent difficulty with initiating and stopping the urine stream. No leg swelling or calf pain.  Physical:  Blood pressure 110/82, pulse 78, temperature 98.9 F (37.2 C), temperature source Oral, resp. rate 18, height '5\' 8"'$  (1.727 m), weight 195 lb (88.5 kg), SpO2 97 %.  General: Pleasant male in no acute distress. HEENT: No thrush or ulcers. Face appears flushed. Lymphatic:  Enlarged bilateral cervical, scalene, axillary, inguinal and femoral nodes. Biopsy site at the right neck is without surrounding erythema. Chest: Lungs clear bilaterally. No wheezes. No respiratory distress. Cardiovascular: Regular rate and rhythm. Abdomen: Abdomen is distended with slight fullness at the left mid to lower abdomen. Extremities: No leg edema. Neuro: Alert and oriented. Follows commands. Gait normal. Skin: Faint erythema over the lower abdominal wall. Port-A-Cath is covered with a gauze bandage.  Labs:  Results for orders placed or performed during the hospital encounter of 03/21/16 (from the past 48 hour(s))  CBC with Differential/Platelet     Status: Abnormal   Collection Time: 03/21/16 11:51 AM  Result Value Ref Range   WBC 4.0 4.0 - 10.5 K/uL   RBC 5.04 4.22 - 5.81 MIL/uL   Hemoglobin 14.9 13.0 - 17.0 g/dL   HCT 42.9 39.0 - 52.0 %   MCV 85.1 78.0 - 100.0 fL   MCH 29.6 26.0 - 34.0 pg   MCHC 34.7 30.0 - 36.0 g/dL   RDW 13.4 11.5 - 15.5 %   Platelets 166 150 - 400 K/uL   Neutrophils Relative % 70 %   Neutro Abs 2.8 1.7 - 7.7 K/uL   Lymphocytes Relative 13 %   Lymphs Abs 0.5 (L) 0.7 - 4.0 K/uL   Monocytes Relative 16 %   Monocytes Absolute 0.6 0.1 - 1.0 K/uL   Eosinophils Relative 1 %   Eosinophils Absolute 0.1 0.0 - 0.7 K/uL   Basophils Relative 0 %   Basophils Absolute 0.0 0.0 - 0.1 K/uL  Protime-INR     Status: None   Collection Time: 03/21/16 11:51 AM  Result Value Ref Range   Prothrombin Time 14.3 11.4 - 15.2 seconds   INR 1.10    Ir US Guide Vasc Access Right  Result Date: 03/21/2016 CLINICAL DATA:  T-cell lymphoma, needs durable venous access for chemotherapy. EXAM: TUNNELED PORT CATHETER PLACEMENT WITH ULTRASOUND AND FLUOROSCOPIC GUIDANCE FLUOROSCOPY TIME:  0.2 minute (39 uGym2 DAP) ANESTHESIA/SEDATION: Intravenous Fentanyl and Versed were administered as conscious sedation during continuous monitoring of the patient's level of consciousness and  physiological / cardiorespiratory status by the radiology RN, with a total moderate sedation time of 15 minutes. TECHNIQUE: The procedure, risks, benefits, and alternatives were explained to the patient. Questions regarding the procedure were encouraged and answered. The patient understands and consents to the procedure. As antibiotic prophylaxis, vancomycin 1 g was ordered pre-procedure and administered intravenously within one hour of incision. Patency of the right IJ vein was confirmed with ultrasound with image documentation. An appropriate skin site was determined. Skin site was marked. Region was prepped using maximum barrier technique including cap and mask, sterile gown, sterile gloves, large sterile sheet, and Chlorhexidine as cutaneous antisepsis. The region was infiltrated locally with 1% lidocaine. Under real-time ultrasound guidance, the right IJ vein was accessed with a 21 gauge micropuncture needle; the needle tip within the vein was confirmed with ultrasound image documentation. Needle was exchanged over a 018 guidewire for transitional dilator which allowed passage of the [facility] wire into the IVC. Over this, the  transitional dilator was exchanged for a 5 [place] MPA catheter. A small incision was made on the right anterior chest wall and a subcutaneous pocket fashioned. The power-injectable port was positioned and its catheter tunneled to the right IJ dermatotomy site. The MPA catheter was exchanged over an Amplatz wire for a peel-away sheath, through which the port catheter, which had been trimmed to the appropriate length, was advanced and positioned under fluoroscopy with its tip at the cavoatrial junction. Spot chest radiograph confirms good catheter position and no pneumothorax. The pocket was closed with deep interrupted and subcuticular continuous 3-0 Monocryl sutures. The port was flushed per protocol. The incisions were covered with Dermabond then covered with a sterile dressing.  COMPLICATIONS: COMPLICATIONS None immediate IMPRESSION: Technically successful right IJ power-injectable port catheter placement. Ready for routine use. Electronically Signed   By: Lucrezia Europe M.D.   On: 03/21/2016 14:29   Ir Fluoro Guide Port Insertion Right  Result Date: 03/21/2016 CLINICAL DATA:  T-cell lymphoma, needs durable venous access for chemotherapy. EXAM: TUNNELED PORT CATHETER PLACEMENT WITH ULTRASOUND AND FLUOROSCOPIC GUIDANCE FLUOROSCOPY TIME:  0.2 minute (39 uGym2 DAP) ANESTHESIA/SEDATION: Intravenous Fentanyl and Versed were administered as conscious sedation during continuous monitoring of the patient's level of consciousness and physiological / cardiorespiratory status by the radiology RN, with a total moderate sedation time of 15 minutes. TECHNIQUE: The procedure, risks, benefits, and alternatives were explained to the patient. Questions regarding the procedure were encouraged and answered. The patient understands and consents to the procedure. As antibiotic prophylaxis, vancomycin 1 g was ordered pre-procedure and administered intravenously within one hour of incision. Patency of the right IJ vein was confirmed with ultrasound with image documentation. An appropriate skin site was determined. Skin site was marked. Region was prepped using maximum barrier technique including cap and mask, sterile gown, sterile gloves, large sterile sheet, and Chlorhexidine as cutaneous antisepsis. The region was infiltrated locally with 1% lidocaine. Under real-time ultrasound guidance, the right IJ vein was accessed with a 21 gauge micropuncture needle; the needle tip within the vein was confirmed with ultrasound image documentation. Needle was exchanged over a 018 guidewire for transitional dilator which allowed passage of the [facility] wire into the IVC. Over this, the transitional dilator was exchanged for a 5 [place] MPA catheter. A small incision was made on the right anterior chest wall and a subcutaneous  pocket fashioned. The power-injectable port was positioned and its catheter tunneled to the right IJ dermatotomy site. The MPA catheter was exchanged over an Amplatz wire for a peel-away sheath, through which the port catheter, which had been trimmed to the appropriate length, was advanced and positioned under fluoroscopy with its tip at the cavoatrial junction. Spot chest radiograph confirms good catheter position and no pneumothorax. The pocket was closed with deep interrupted and subcuticular continuous 3-0 Monocryl sutures. The port was flushed per protocol. The incisions were covered with Dermabond then covered with a sterile dressing. COMPLICATIONS: COMPLICATIONS None immediate IMPRESSION: Technically successful right IJ power-injectable port catheter placement. Ready for routine use. Electronically Signed   By: Lucrezia Europe M.D.   On: 03/21/2016 14:29    Assessment  1. 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  PET scan 03/20/2016 with extensive bulky hypermetabolic nodal activity in the neck, chest, abdomen and pelvis; mild splenomegaly with diffusely mildly accentuated splenic activity  Staging bone marrow biopsy 03/23/2016  Cycle 1 EPOCH beginning 03/23/2016 2. 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 He entered clinical remission following chemotherapy and remained in remission when he was seen at the cancer center 02/24/2009. 3. Recurrent large B-cell lymphoma involving a left pharynx mass  July 2012, status post a biopsy 08/11/2010 confirming a diffuse large B-cell lymphoma, CD20 positive, IIA. Staging PET scan 08/23/2010 with increased FDG activity at the left tonsillar fossa and no additional evidence of lymphoma. He completed 4 cycles of R-ICE with cycle #1 beginning on 09/19/2010 and cycle #4 on 11/21/2010. Repeat head and neck examination by Dr. Constance Holster following R-ICE/rituximab showed no residual lymphoma. He began radiation consolidation on 12/18/2010, radiation was completed on 01/22/2011. 4. History of neutropenia secondary to rituximab. 5. Anemia secondary to chemotherapy, status post a red blood cell transfusion 11/30/2010. The hemoglobin has normalized. 6. History of Mild thrombocytopenia secondary to chemotherapy. 7. Hypertension. 8. 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 He recently presented with diffuse palpable lymphadenopathy and sweats. Right cervical lymph node biopsy confirmed T-cell lymphoma. He is being admitted to proceed with cycle 1 EPOCH.   Allopurinol was initiated as an outpatient and will be continued. Intravenous hydration will be initiated. He will have a 2-D echo today and staging bone marrow biopsy 03/23/2016. Plan to begin [facility] following the bone marrow biopsy on 03/23/2016. Potential toxicities associated with the chemotherapy have been discussed. We discussed the possibility of tumor lysis syndrome. He is agreeable to proceed.   [name] ANP/GNP-BC 03/22/2016, 1:08 PM  [name] was interviewed and examined. He is admitted for cycle 1 EPOCH. He will continue intravenous hydration and allopurinol with close follow-up of the chemistry panel and uric  acid during the course of chemotherapy. We reviewed the potential toxicities associated with this chemotherapy regimen including the chance for tumor lysis syndrome. He agrees to proceed.

## 2016-03-22 NOTE — Progress Notes (Signed)
  Echocardiogram 2D Echocardiogram has been performed.  [name] 03/22/2016, 2:35 PM

## 2016-03-22 NOTE — Progress Notes (Signed)
Referring Physician(s): Sherrill,B  Supervising Physician: Sandi Mariscal  Patient Status:  [facility] - In-pt  Chief Complaint:  lymphoma  Subjective: Pt s/p rt chest wall PAC placement by our service yesterday. Noted history of T-cell lymphoma. Currently doing well. Port functioning okay. Plan to receive chemotherapy tomorrow. Request now received for CT-guided bone marrow biopsy.   Allergies: Penicillins  Medications: Prior to Admission medications   Medication Sig Start Date End Date Taking? Authorizing Provider  acetaminophen (TYLENOL) 500 MG tablet Take 1,000 mg by mouth every 6 (six) hours as needed for mild pain, moderate pain, fever or headache.   Yes Historical Provider, MD  allopurinol (ZYLOPRIM) 300 MG tablet Take 1 tablet (300 mg total) by mouth daily. 03/18/16  Yes [name], NP  atenolol (TENORMIN) 50 MG tablet Take 50 mg by mouth daily. 08/19/14  Yes Historical Provider, MD  [place] 575 MG/5ML SYRP Take 5 mLs by mouth daily.   Yes Historical Provider, MD  naproxen sodium (ANAPROX) 220 MG tablet Take 220 mg by mouth every 12 (twelve) hours as needed (for pain/fever).   Yes Historical Provider, MD     Vital Signs: BP 110/82 (BP Location: Left Arm)   Pulse 78   Temp 98.9 F (37.2 C) (Oral)   Resp 18   Ht '5\' 8"'$  (1.727 m)   Wt 195 lb (88.5 kg)   SpO2 97%   BMI 29.65 kg/m   Physical Exam Awake, alert.Noted enlarged bilateral cervical, axillary, inguinal and femoral nodes;chest clear to auscultation bilaterally. Clean, intact right chest wall Port-A-Cath. Heart with regular rate and rhythm. 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 EXAM: NUCLEAR MEDICINE PET SKULL BASE TO THIGH TECHNIQUE: 9.6 mCi F-18 FDG was injected intravenously.  Full-ring PET imaging was performed from the skull base to thigh after the radiotracer. CT data was obtained and used for attenuation correction and anatomic localization. FASTING BLOOD GLUCOSE:  Value: 94 mg/dl COMPARISON:  08/23/2010 FINDINGS: NECK Hypermetabolic lymph nodes at levels Ia. IIa, IIb, III, IV, Va, Vb, as well as level Ia on the right. There are intraglandular lymph nodes in the parotid glands as well as a lymph node within or along the right pterygoid musculature. An index station IIa lymph node just posterior to the right mandible on image 25/4 measures 1.7 cm in short axis and has a maximum standard uptake value of 11.1 CHEST Extensive bilateral axillary and subpectoral adenopathy along with paratracheal, paraesophageal, subcarinal, right infrahilar, and right lower internal mammary adenopathy observed. An index large right axillary lymph node measures 3.4 cm in short axis on image 58/4 and has a maximum SUV of 13.2. There is also a small hypermetabolic focus in the right infraspinatus muscle, likely due to an adjacent lymph node along the deep surface of the muscle. ABDOMEN/PELVIS Gastrohepatic ligament, peripancreatic, right retrocrural, porta hepatis, retroperitoneal/ periaortic, retrocaval, portacaval, common iliac, internal iliac, external iliac, and inguinal adenopathy noted. An index left periaortic lymph node measuring 2.1 cm on image 127/4 has a maximum standard uptake value of 14.1. There also some scattered mesenteric lymph nodes, a hypermetabolic focus along the left rectus abdominus muscle, a hypermetabolic focus in the right psoas muscle, a hypermetabolic focus along the right posterior pelvic floor, and a possible hypermetabolic focus medial to the left proximal femur on the bottom  most images. Diffuse mildly increased splenic activity, maximum SUV 5.1. Several fluid density hepatic lesions are identified. Mild splenomegaly. SKELETON No definite abnormal osseous activity. REFERENCE  VALUES: Background mediastinal blood pool activity:  3.1 Background hepatic activity:  3.7 IMPRESSION: 1. Extensive bulky hypermetabolic nodal activity in the neck, chest, abdomen, and pelvis as detailed above, [place] 5. 2. Mild splenomegaly with diffusely mildly accentuated splenic activity. Electronically Signed   By: Van Clines M.D.   On: 03/20/2016 13:32   Ir US Guide Vasc Access Right  Result Date: 03/21/2016 CLINICAL DATA:  T-cell lymphoma, needs durable venous access for chemotherapy. EXAM: TUNNELED PORT CATHETER PLACEMENT WITH ULTRASOUND AND FLUOROSCOPIC GUIDANCE FLUOROSCOPY TIME:  0.2 minute (39 uGym2 DAP) ANESTHESIA/SEDATION: Intravenous Fentanyl and Versed were administered as conscious sedation during continuous monitoring of the patient's level of consciousness and physiological / cardiorespiratory status by the radiology RN, with a total moderate sedation time of 15 minutes. TECHNIQUE: The procedure, risks, benefits, and alternatives were explained to the patient. Questions regarding the procedure were encouraged and answered. The patient understands and consents to the procedure. As antibiotic prophylaxis, vancomycin 1 g was ordered pre-procedure and administered intravenously within one hour of incision. Patency of the right IJ vein was confirmed with ultrasound with image documentation. An appropriate skin site was determined. Skin site was marked. Region was prepped using maximum barrier technique including cap and mask, sterile gown, sterile gloves, large sterile sheet, and Chlorhexidine as cutaneous antisepsis. The region was infiltrated locally with 1% lidocaine. Under real-time ultrasound guidance, the right IJ vein was accessed with a 21 gauge micropuncture needle; the needle tip within the vein was confirmed with ultrasound image documentation. Needle was exchanged over a 018 guidewire for transitional dilator which allowed passage of the [facility] 2 wire into the IVC. Over this,  the transitional dilator was exchanged for a 5 [place] MPA catheter. A small incision was made on the right anterior chest wall and a subcutaneous pocket fashioned. The power-injectable port was positioned and its catheter tunneled to the right IJ dermatotomy site. The MPA catheter was exchanged over an Amplatz wire for a peel-away sheath, through which the port catheter, which had been trimmed to the appropriate length, was advanced and positioned under fluoroscopy with its tip at the cavoatrial junction. Spot chest radiograph confirms good catheter position and no pneumothorax. The pocket was closed with deep interrupted and subcuticular continuous 3-0 Monocryl sutures. The port was flushed per protocol. The incisions were covered with Dermabond then covered with a sterile dressing. COMPLICATIONS: COMPLICATIONS None immediate IMPRESSION: Technically successful right IJ power-injectable port catheter placement. Ready for routine use. Electronically Signed   By: Lucrezia Europe M.D.   On: 03/21/2016 14:29   Ir Fluoro Guide Port Insertion Right  Result Date: 03/21/2016 CLINICAL DATA:  T-cell lymphoma, needs durable venous access for chemotherapy. EXAM: TUNNELED PORT CATHETER PLACEMENT WITH ULTRASOUND AND FLUOROSCOPIC GUIDANCE FLUOROSCOPY TIME:  0.2 minute (39 uGym2 DAP) ANESTHESIA/SEDATION: Intravenous Fentanyl and Versed were administered as conscious sedation during continuous monitoring of the patient's level of consciousness and physiological / cardiorespiratory status by the radiology RN, with a total moderate sedation time of 15 minutes. TECHNIQUE: The procedure, risks, benefits, and alternatives were explained to the patient. Questions regarding the procedure were encouraged and answered. The patient understands and consents to the procedure. As antibiotic prophylaxis, vancomycin 1 g was ordered pre-procedure and administered intravenously within one hour of incision. Patency of the right IJ vein was confirmed  with ultrasound with image documentation.  An appropriate skin site was determined. Skin site was marked. Region was prepped using maximum barrier technique including cap and mask, sterile gown, sterile gloves, large sterile sheet, and Chlorhexidine as cutaneous antisepsis. The region was infiltrated locally with 1% lidocaine. Under real-time ultrasound guidance, the right IJ vein was accessed with a 21 gauge micropuncture needle; the needle tip within the vein was confirmed with ultrasound image documentation. Needle was exchanged over a 018 guidewire for transitional dilator which allowed passage of the [facility] wire into the IVC. Over this, the transitional dilator was exchanged for a 5 [place] MPA catheter. A small incision was made on the right anterior chest wall and a subcutaneous pocket fashioned. The power-injectable port was positioned and its catheter tunneled to the right IJ dermatotomy site. The MPA catheter was exchanged over an Amplatz wire for a peel-away sheath, through which the port catheter, which had been trimmed to the appropriate length, was advanced and positioned under fluoroscopy with its tip at the cavoatrial junction. Spot chest radiograph confirms good catheter position and no pneumothorax. The pocket was closed with deep interrupted and subcuticular continuous 3-0 Monocryl sutures. The port was flushed per protocol. The incisions were covered with Dermabond then covered with a sterile dressing. COMPLICATIONS: COMPLICATIONS None immediate IMPRESSION: Technically successful right IJ power-injectable port catheter placement. Ready for routine use. Electronically Signed   By: Lucrezia Europe M.D.   On: 03/21/2016 14:29    Labs:  CBC:  Recent Labs  11/25/15 0810 03/16/16 1633 03/21/16 1151  WBC 4.8 3.5* 4.0  HGB 15.4 15.1 14.9  HCT 45.9 43.9 42.9  PLT 160 149 166    COAGS:  Recent Labs  03/21/16 1151  INR 1.10    BMP:  Recent Labs  11/25/15 0848 03/16/16 1633  03/20/16 0958  NA 141 139 142  K 4.2 4.3 4.9  CO2 24 28 30*  GLUCOSE 100 107 105  BUN 17.0 13.6 14.7  CALCIUM 8.9 9.9 10.2  CREATININE 0.9 1.3 1.1    LIVER FUNCTION TESTS:  Recent Labs  11/25/15 0848 03/16/16 1633 03/20/16 0958  BILITOT 0.67 1.52* 1.48*  AST 23 49* 88*  ALT 35 62* 151*  ALKPHOS 28* 68 72  PROT 6.7 7.0 7.1  ALBUMIN 3.8 3.7 3.8    Assessment and Plan: Patient with hx large B cell lymphoma initially diagnosed in 2005 with prior treatment and subsequent recurrence in 2012; also with history of left thigh schwannoma resection in 2014; now with newly diagnosed T cell lymphoma. Scheduled for CT-guided bone marrow biopsy on 3/2.Risks and benefits discussed with the patient /wife including, but not limited to bleeding, infection, damage to adjacent structures or low yield requiring additional tests. All of the patient's questions were answered, patient is agreeable to proceed. Consent signed and in chart.     Electronically Signed: D. Rowe Robert 03/22/2016, 3:13 PM   I spent a total of 20 minutes at the the patient's bedside AND on the patient's hospital floor or unit, greater than 50% of which was counseling/coordinating care for CT-guided bone marrow biopsy    Patient ID: ANTWION CARPENTER, male   DOB: 1960-05-21, 56 y.o.   MRN: [id number]

## 2016-03-23 ENCOUNTER — Inpatient Hospital Stay (HOSPITAL_COMMUNITY)
Admission: AD | Admit: 2016-03-23 | Discharge: 2016-03-23 | Disposition: A | Discharge status: Home / Self Care | Payer: 59 | Source: Ambulatory Visit | Attending: Nurse Practitioner | Admitting: Nurse Practitioner

## 2016-03-23 ENCOUNTER — Ambulatory Visit (HOSPITAL_COMMUNITY): Payer: 59

## 2016-03-23 ENCOUNTER — Ambulatory Visit (HOSPITAL_COMMUNITY): Admission: RE | Admit: 2016-03-23 | Payer: 59 | Source: Ambulatory Visit

## 2016-03-23 DIAGNOSIS — C8590 Non-Hodgkin lymphoma, unspecified, unspecified site: Secondary | ICD-10-CM

## 2016-03-23 DIAGNOSIS — Z5111 Encounter for antineoplastic chemotherapy: Principal | ICD-10-CM

## 2016-03-23 LAB — CBC WITH DIFFERENTIAL/PLATELET
Basophils Absolute: 0 10*3/uL (ref 0.0–0.1)
Basophils Relative: 0 %
EOS PCT: 3 %
Eosinophils Absolute: 0.1 10*3/uL (ref 0.0–0.7)
HCT: 34.8 % — ABNORMAL LOW (ref 39.0–52.0)
Hemoglobin: 11.8 g/dL — ABNORMAL LOW (ref 13.0–17.0)
LYMPHS ABS: 0.4 10*3/uL — AB (ref 0.7–4.0)
LYMPHS PCT: 15 %
MCH: 28.9 pg (ref 26.0–34.0)
MCHC: 33.9 g/dL (ref 30.0–36.0)
MCV: 85.3 fL (ref 78.0–100.0)
MONOS PCT: 23 %
Monocytes Absolute: 0.6 10*3/uL (ref 0.1–1.0)
Neutro Abs: 1.6 10*3/uL — ABNORMAL LOW (ref 1.7–7.7)
Neutrophils Relative %: 59 %
PLATELETS: 142 10*3/uL — AB (ref 150–400)
RBC: 4.08 MIL/uL — AB (ref 4.22–5.81)
RDW: 13.5 % (ref 11.5–15.5)
WBC: 2.7 10*3/uL — AB (ref 4.0–10.5)

## 2016-03-23 LAB — BONE MARROW EXAM

## 2016-03-23 LAB — HIV ANTIBODY (ROUTINE TESTING W REFLEX): HIV Screen 4th Generation wRfx: NONREACTIVE

## 2016-03-23 MED ORDER — FENTANYL CITRATE (PF) 100 MCG/2ML IJ SOLN
INTRAMUSCULAR | Status: AC | PRN
  Administered 2016-03-23: 25 ug via INTRAVENOUS
  Administered 2016-03-23: 50 ug via INTRAVENOUS
  Administered 2016-03-23: 25 ug via INTRAVENOUS

## 2016-03-23 MED ORDER — PALONOSETRON HCL INJECTION 0.25 MG/5ML
0.2500 mg | Freq: Once | INTRAVENOUS | Status: AC
  Administered 2016-03-23: 0.25 mg via INTRAVENOUS
  Filled 2016-03-23: qty 5

## 2016-03-23 MED ORDER — SODIUM CHLORIDE 0.9% FLUSH: 3.0000 mL | INTRAVENOUS | Status: DC | PRN

## 2016-03-23 MED ORDER — VINCRISTINE SULFATE CHEMO INJECTION 1 MG/ML
Freq: Once | INTRAVENOUS | Status: AC
  Administered 2016-03-23: 13:00:00 via INTRAVENOUS
  Filled 2016-03-23: qty 10

## 2016-03-23 MED ORDER — HOT PACK MISC ONCOLOGY
1.0000 | Freq: Once | Status: AC | PRN
  Filled 2016-03-23: qty 1

## 2016-03-23 MED ORDER — SODIUM CHLORIDE 0.9 % IV SOLN: Freq: Once | INTRAVENOUS | Status: DC

## 2016-03-23 MED ORDER — SODIUM CHLORIDE 0.9 % IV SOLN
10.0000 mg | Freq: Once | INTRAVENOUS | Status: AC
  Administered 2016-03-23: 10 mg via INTRAVENOUS
  Filled 2016-03-23: qty 1

## 2016-03-23 MED ORDER — FENTANYL CITRATE (PF) 100 MCG/2ML IJ SOLN
INTRAMUSCULAR | Status: AC
  Filled 2016-03-23: qty 4

## 2016-03-23 MED ORDER — MIDAZOLAM HCL 2 MG/2ML IJ SOLN
INTRAMUSCULAR | Status: AC | PRN
Start: 2016-03-23 — End: 2016-03-23
  Administered 2016-03-23: 1 mg via INTRAVENOUS
  Administered 2016-03-23: 2 mg via INTRAVENOUS
  Administered 2016-03-23: 1 mg via INTRAVENOUS

## 2016-03-23 MED ORDER — HEPARIN SOD (PORK) LOCK FLUSH 100 UNIT/ML IV SOLN
500.0000 [IU] | Freq: Once | INTRAVENOUS | Status: AC | PRN
  Administered 2016-03-27: 500 [IU]
  Filled 2016-03-23: qty 5

## 2016-03-23 MED ORDER — ETOPOSIDE CHEMO INJECTION 500 MG/25ML: Freq: Once | INTRAVENOUS | Status: DC

## 2016-03-23 MED ORDER — SODIUM CHLORIDE 0.9% FLUSH: 10.0000 mL | INTRAVENOUS | Status: DC | PRN

## 2016-03-23 MED ORDER — ALLOPURINOL 300 MG PO TABS
300.0000 mg | ORAL_TABLET | Freq: Every day | ORAL | Status: DC
  Administered 2016-03-23 – 2016-03-27 (×5): 300 mg via ORAL
  Filled 2016-03-23 (×5): qty 1

## 2016-03-23 MED ORDER — ALTEPLASE 2 MG IJ SOLR
2.0000 mg | Freq: Once | INTRAMUSCULAR | Status: DC | PRN
  Filled 2016-03-23: qty 2

## 2016-03-23 MED ORDER — HEPARIN SOD (PORK) LOCK FLUSH 100 UNIT/ML IV SOLN: 250.0000 [IU] | Freq: Once | INTRAVENOUS | Status: DC | PRN

## 2016-03-23 MED ORDER — ATENOLOL 50 MG PO TABS
50.0000 mg | ORAL_TABLET | Freq: Every day | ORAL | Status: DC
  Administered 2016-03-24 – 2016-03-27 (×4): 50 mg via ORAL
  Filled 2016-03-23 (×5): qty 1

## 2016-03-23 MED ORDER — COLD PACK MISC ONCOLOGY
1.0000 | Freq: Once | Status: AC | PRN
  Filled 2016-03-23: qty 1

## 2016-03-23 MED ORDER — MIDAZOLAM HCL 2 MG/2ML IJ SOLN
INTRAMUSCULAR | Status: AC
  Filled 2016-03-23: qty 6

## 2016-03-23 MED ORDER — ENOXAPARIN SODIUM 40 MG/0.4ML ~~LOC~~ SOLN
40.0000 mg | SUBCUTANEOUS | Status: DC
  Administered 2016-03-23 – 2016-03-26 (×4): 40 mg via SUBCUTANEOUS
  Filled 2016-03-23 (×4): qty 0.4

## 2016-03-23 NOTE — Progress Notes (Signed)
LFTs elevated (AST/ALT 71/119)--> Dr. [name] is aware and said ok to give chemo (full dose) today despite labs.  Dia Sitter, PharmD, BCPS 03/23/2016 12:34 PM

## 2016-03-23 NOTE — Procedures (Signed)
Pre-procedure Diagnosis: T Cell Lymphoma. Post-procedure Diagnosis: Same  Technically successful CT guided bone marrow aspiration and biopsy of left iliac crest.   Complications: None Immediate  EBL: None  Signed: Sandi Mariscal Pager: [phone] 03/23/2016, 9:34 AM

## 2016-03-23 NOTE — Progress Notes (Signed)
Dosages and dilutions verified for Doxorubicin, Etoposide and Oncovin with Aloha Gell, RN.

## 2016-03-23 NOTE — Progress Notes (Signed)
NPO at midnight, IVF infusing, awaiting biopsy this am, spouse at bedside, labs collected this am, no acute events.

## 2016-03-23 NOTE — Progress Notes (Signed)
IP PROGRESS NOTE  Subjective:   He reports a mild cough. He had sweats last night. Good urine output. No new complaint.  Objective: Vital signs in last 24 hours: Blood pressure 107/76, pulse 68, temperature 98.9 F (37.2 C), temperature source Oral, resp. rate 18, height 5' 8" (1.727 m), weight 195 lb (88.5 kg), SpO2 98 %.  Intake/Output from previous day: 03/01 0701 - 03/02 0700 In: 1722.5 [P.O.:480; I.V.:1242.5] Out: -   Physical Exam:  Lungs: Clear bilaterally, no respiratory distress Cardiac: Regular rate and rhythm Abdomen: No hepatosplenomegaly, nontender Extremities: No leg edema   Portacath/PICC-without erythema  Lab Results:  Recent Labs  03/22/16 0906 03/23/16 0500  WBC 4.0 2.7*  HGB 13.1 11.8*  HCT 38.2* 34.8*  PLT 168 142*    BMET  Recent Labs  03/20/16 0958 03/22/16 0906  NA 142 137  K 4.9 3.9  CL  --  102  CO2 30* 27  GLUCOSE 105 111*  BUN 14.7 19  CREATININE 1.1 1.17  CALCIUM 10.2 9.4    Studies/Results: Ir US Guide Vasc Access Right  Result Date: 03/21/2016 CLINICAL DATA:  T-cell lymphoma, needs durable venous access for chemotherapy. EXAM: TUNNELED PORT CATHETER PLACEMENT WITH ULTRASOUND AND FLUOROSCOPIC GUIDANCE FLUOROSCOPY TIME:  0.2 minute (39 uGym2 DAP) ANESTHESIA/SEDATION: Intravenous Fentanyl and Versed were administered as conscious sedation during continuous monitoring of the patient's level of consciousness and physiological / cardiorespiratory status by the radiology RN, with a total moderate sedation time of 15 minutes. TECHNIQUE: The procedure, risks, benefits, and alternatives were explained to the patient. Questions regarding the procedure were encouraged and answered. The patient understands and consents to the procedure. As antibiotic prophylaxis, vancomycin 1 g was ordered pre-procedure and administered intravenously within one hour of incision. Patency of the right IJ vein was confirmed with ultrasound with image  documentation. An appropriate skin site was determined. Skin site was marked. Region was prepped using maximum barrier technique including cap and mask, sterile gown, sterile gloves, large sterile sheet, and Chlorhexidine as cutaneous antisepsis. The region was infiltrated locally with 1% lidocaine. Under real-time ultrasound guidance, the right IJ vein was accessed with a 21 gauge micropuncture needle; the needle tip within the vein was confirmed with ultrasound image documentation. Needle was exchanged over a 018 guidewire for transitional dilator which allowed passage of the [facility] wire into the IVC. Over this, the transitional dilator was exchanged for a 5 [place] MPA catheter. A small incision was made on the right anterior chest wall and a subcutaneous pocket fashioned. The power-injectable port was positioned and its catheter tunneled to the right IJ dermatotomy site. The MPA catheter was exchanged over an Amplatz wire for a peel-away sheath, through which the port catheter, which had been trimmed to the appropriate length, was advanced and positioned under fluoroscopy with its tip at the cavoatrial junction. Spot chest radiograph confirms good catheter position and no pneumothorax. The pocket was closed with deep interrupted and subcuticular continuous 3-0 Monocryl sutures. The port was flushed per protocol. The incisions were covered with Dermabond then covered with a sterile dressing. COMPLICATIONS: COMPLICATIONS None immediate IMPRESSION: Technically successful right IJ power-injectable port catheter placement. Ready for routine use. Electronically Signed   By: Lucrezia Europe M.D.   On: 03/21/2016 14:29   Ir Fluoro Guide Port Insertion Right  Result Date: 03/21/2016 CLINICAL DATA:  T-cell lymphoma, needs durable venous access for chemotherapy. EXAM: TUNNELED PORT CATHETER PLACEMENT WITH ULTRASOUND AND FLUOROSCOPIC GUIDANCE FLUOROSCOPY TIME:  0.2 minute (39 uGym2  DAP) ANESTHESIA/SEDATION: Intravenous  Fentanyl and Versed were administered as conscious sedation during continuous monitoring of the patient's level of consciousness and physiological / cardiorespiratory status by the radiology RN, with a total moderate sedation time of 15 minutes. TECHNIQUE: The procedure, risks, benefits, and alternatives were explained to the patient. Questions regarding the procedure were encouraged and answered. The patient understands and consents to the procedure. As antibiotic prophylaxis, vancomycin 1 g was ordered pre-procedure and administered intravenously within one hour of incision. Patency of the right IJ vein was confirmed with ultrasound with image documentation. An appropriate skin site was determined. Skin site was marked. Region was prepped using maximum barrier technique including cap and mask, sterile gown, sterile gloves, large sterile sheet, and Chlorhexidine as cutaneous antisepsis. The region was infiltrated locally with 1% lidocaine. Under real-time ultrasound guidance, the right IJ vein was accessed with a 21 gauge micropuncture needle; the needle tip within the vein was confirmed with ultrasound image documentation. Needle was exchanged over a 018 guidewire for transitional dilator which allowed passage of the [facility] wire into the IVC. Over this, the transitional dilator was exchanged for a 5 [place] MPA catheter. A small incision was made on the right anterior chest wall and a subcutaneous pocket fashioned. The power-injectable port was positioned and its catheter tunneled to the right IJ dermatotomy site. The MPA catheter was exchanged over an Amplatz wire for a peel-away sheath, through which the port catheter, which had been trimmed to the appropriate length, was advanced and positioned under fluoroscopy with its tip at the cavoatrial junction. Spot chest radiograph confirms good catheter position and no pneumothorax. The pocket was closed with deep interrupted and subcuticular continuous 3-0 Monocryl  sutures. The port was flushed per protocol. The incisions were covered with Dermabond then covered with a sterile dressing. COMPLICATIONS: COMPLICATIONS None immediate IMPRESSION: Technically successful right IJ power-injectable port catheter placement. Ready for routine use. Electronically Signed   By: Lucrezia Europe M.D.   On: 03/21/2016 14:29    Medications: I have reviewed the patient's current medications.  Assessment/Plan: 1. 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  PET scan 03/20/2016 with extensive bulky hypermetabolic nodal activity in the neck, chest, abdomen and pelvis; mild splenomegaly with diffusely mildly accentuated splenic activity  Staging bone marrow biopsy 03/23/2016  Cycle 1 EPOCH 03/23/2016 2. 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 He entered clinical remission following chemotherapy and remained in remission when he was seen at the cancer center 02/24/2009. 3. 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 Staging PET scan 08/23/2010 with increased FDG activity at the left tonsillar fossa and no additional evidence of lymphoma. He completed 4 cycles of R-ICE with cycle #1 beginning on 09/19/2010 and cycle #4 on 11/21/2010. Repeat head and neck examination by Dr. Constance Holster following R-ICE/rituximab showed no residual lymphoma. He began radiation consolidation on  12/18/2010, radiation was completed on 01/22/2011. 4. History of neutropenia secondary to rituximab. 5. Anemia secondary to chemotherapy, status post a red blood cell transfusion  11/30/2010. The hemoglobin has normalized. 6. History of Mild thrombocytopenia secondary to chemotherapy. 7. Hypertension. 8. Left thigh mass-status post surgical excision [facility] November 2014 confirming a schwannoma 9. Port-A-Cath placement 03/21/2016  [name] appears stable. He will undergo a staging bone marrow biopsy does morning and then begin cycle 1 EPOCH. The LVEF is normal on the echocardiogram from yesterday.  The plan is to continue intravenous hydration and allopurinol as he is at risk for tumor lysis syndrome. We will check the creatinine and uric acid daily.    LOS: 1 day   [name], MD   03/23/2016, 7:26 AM

## 2016-03-24 DIAGNOSIS — R739 Hyperglycemia, unspecified: Secondary | ICD-10-CM

## 2016-03-24 LAB — COMPREHENSIVE METABOLIC PANEL
ALT: 93 U/L — ABNORMAL HIGH (ref 17–63)
ANION GAP: 6 (ref 5–15)
AST: 52 U/L — ABNORMAL HIGH (ref 15–41)
Albumin: 3.1 g/dL — ABNORMAL LOW (ref 3.5–5.0)
Alkaline Phosphatase: 47 U/L (ref 38–126)
BUN: 12 mg/dL (ref 6–20)
CHLORIDE: 107 mmol/L (ref 101–111)
CO2: 24 mmol/L (ref 22–32)
CREATININE: 0.78 mg/dL (ref 0.61–1.24)
Calcium: 8.9 mg/dL (ref 8.9–10.3)
Glucose, Bld: 228 mg/dL — ABNORMAL HIGH (ref 65–99)
POTASSIUM: 3.8 mmol/L (ref 3.5–5.1)
SODIUM: 137 mmol/L (ref 135–145)
Total Bilirubin: 0.8 mg/dL (ref 0.3–1.2)
Total Protein: 5.7 g/dL — ABNORMAL LOW (ref 6.5–8.1)

## 2016-03-24 LAB — URIC ACID: URIC ACID, SERUM: 3 mg/dL — AB (ref 4.4–7.6)

## 2016-03-24 MED ORDER — HEPARIN SOD (PORK) LOCK FLUSH 100 UNIT/ML IV SOLN: 250.0000 [IU] | Freq: Once | INTRAVENOUS | Status: DC | PRN

## 2016-03-24 MED ORDER — INSULIN ASPART 100 UNIT/ML ~~LOC~~ SOLN
0.0000 [IU] | Freq: Three times a day (TID) | SUBCUTANEOUS | Status: DC
  Administered 2016-03-24: 2 [IU] via SUBCUTANEOUS
  Administered 2016-03-24: 3 [IU] via SUBCUTANEOUS
  Administered 2016-03-25 (×2): 2 [IU] via SUBCUTANEOUS

## 2016-03-24 MED ORDER — SODIUM CHLORIDE 0.9% FLUSH: 10.0000 mL | INTRAVENOUS | Status: DC | PRN

## 2016-03-24 MED ORDER — HOT PACK MISC ONCOLOGY
1.0000 | Freq: Once | Status: AC | PRN
  Filled 2016-03-24: qty 1

## 2016-03-24 MED ORDER — SODIUM CHLORIDE 0.9% FLUSH: 3.0000 mL | INTRAVENOUS | Status: DC | PRN

## 2016-03-24 MED ORDER — COLD PACK MISC ONCOLOGY
1.0000 | Freq: Once | Status: AC | PRN
  Filled 2016-03-24: qty 1

## 2016-03-24 MED ORDER — ALTEPLASE 2 MG IJ SOLR
2.0000 mg | Freq: Once | INTRAMUSCULAR | Status: DC | PRN
  Filled 2016-03-24: qty 2

## 2016-03-24 MED ORDER — HEPARIN SOD (PORK) LOCK FLUSH 100 UNIT/ML IV SOLN: 500.0000 [IU] | Freq: Once | INTRAVENOUS | Status: DC | PRN

## 2016-03-24 MED ORDER — DEXAMETHASONE SODIUM PHOSPHATE 100 MG/10ML IJ SOLN
10.0000 mg | Freq: Once | INTRAMUSCULAR | Status: AC
  Administered 2016-03-24: 10 mg via INTRAVENOUS
  Filled 2016-03-24: qty 1

## 2016-03-24 MED ORDER — VINCRISTINE SULFATE CHEMO INJECTION 1 MG/ML
Freq: Once | INTRAVENOUS | Status: AC
  Administered 2016-03-24: 14:00:00 via INTRAVENOUS
  Filled 2016-03-24 (×2): qty 10

## 2016-03-24 MED ORDER — INSULIN ASPART 100 UNIT/ML ~~LOC~~ SOLN
0.0000 [IU] | Freq: Every day | SUBCUTANEOUS | Status: DC
  Administered 2016-03-25 (×2): 2 [IU] via SUBCUTANEOUS

## 2016-03-24 MED ORDER — SODIUM CHLORIDE 0.9 % IV SOLN
Freq: Once | INTRAVENOUS | Status: AC
  Administered 2016-03-24: 12:00:00 via INTRAVENOUS

## 2016-03-24 NOTE — Progress Notes (Addendum)
[name]   DOB:[birth year]   OE#:[id number]   OIB#:[id number]  ONCOLOGY SERVICE  Subjective: [name] is tolerating chemotherapy very well, day 2 today. He denies any nausea, vomiting, or other issues. He has been eating and drinking well, bowel moment was normal yesterday. He tolerated the bone marrow biopsy well yesterday also. No other complaints.   Objective:  Vitals:   03/23/16 2058 03/24/16 0539  BP: 129/83 128/80  Pulse: 88 81  Resp: 18 20  Temp: 98.1 F (36.7 C) 97.6 F (36.4 C)    Body mass index is 29.65 kg/m.  Intake/Output Summary (Last 24 hours) at 03/24/16 1039 Last data filed at 03/24/16 0900  Gross per 24 hour  Intake          6125.84 ml  Output             5000 ml  Net          1125.84 ml     Sclerae unicteric  Oropharynx clear  (+) Adenopathy in the right neck, bilateral axilla  Lungs clear -- no rales or rhonchi  Heart regular rate and rhythm  Abdomen benign  MSK no focal spinal tenderness, no peripheral edema  Neuro nonfocal   CBG (last 3)  No results for input(s): GLUCAP in the last 72 hours.   Labs:  Lab Results  Component Value Date   WBC 2.7 (L) 03/23/2016   HGB 11.8 (L) 03/23/2016   HCT 34.8 (L) 03/23/2016   MCV 85.3 03/23/2016   PLT 142 (L) 03/23/2016   NEUTROABS 1.6 (L) 03/23/2016   CMP Latest Ref Rng & Units 03/24/2016 03/22/2016 03/20/2016  Glucose 65 - 99 mg/dL 228(H) 111(H) 105  BUN 6 - 20 mg/dL 12 19 14.7  Creatinine 0.61 - 1.24 mg/dL 0.78 1.17 1.1  Sodium 135 - 145 mmol/L 137 137 142  Potassium 3.5 - 5.1 mmol/L 3.8 3.9 4.9  Chloride 101 - 111 mmol/L 107 102 -  CO2 22 - 32 mmol/L 24 27 30(H)  Calcium 8.9 - 10.3 mg/dL 8.9 9.4 10.2  Total Protein 6.5 - 8.1 g/dL 5.7(L) 6.4(L) 7.1  Total Bilirubin 0.3 - 1.2 mg/dL 0.8 1.1 1.48(H)  Alkaline Phos 38 - 126 U/L 47 59 72  AST 15 - 41 U/L 52(H) 71(H) 88(H)  ALT 17 - 63 U/L 93(H) 119(H) 151(H)   Urine Studies No results for input(s): UHGB, CRYS in the last 72 hours.  Invalid input(s):  UACOL, UAPR, USPG, UPH, UTP, UGL, UKET, UBIL, UNIT, UROB, ULEU, UEPI, UWBC, Duwayne Heck [place], [place]  Basic Metabolic Panel:  Recent Labs Lab 03/20/16 0958 03/22/16 0906 03/24/16 0512  NA 142 137 137  K 4.9 3.9 3.8  CL  --  102 107  CO2 30* 27 24  GLUCOSE 105 111* 228*  BUN 14.'7 19 12  '$ CREATININE 1.1 1.17 0.78  CALCIUM 10.2 9.4 8.9   GFR Estimated Creatinine Clearance: 112.7 mL/min (by C-G formula based on SCr of 0.78 mg/dL). Liver Function Tests:  Recent Labs Lab 03/20/16 0958 03/22/16 0906 03/24/16 0512  AST 88* 71* 52*  ALT 151* 119* 93*  ALKPHOS 72 59 47  BILITOT 1.48* 1.1 0.8  PROT 7.1 6.4* 5.7*  ALBUMIN 3.8 3.6 3.1*   No results for input(s): LIPASE, AMYLASE in the last 168 hours. No results for input(s): AMMONIA in the last 168 hours. Coagulation profile  Recent Labs Lab 03/21/16 1151  INR 1.10    CBC:  Recent Labs Lab 03/21/16 1151 03/22/16  0630 03/23/16 0500  WBC 4.0 4.0 2.7*  NEUTROABS 2.8 2.8 1.6*  HGB 14.9 13.1 11.8*  HCT 42.9 38.2* 34.8*  MCV 85.1 84.7 85.3  PLT 166 168 142*   Cardiac Enzymes: No results for input(s): CKTOTAL, CKMB, CKMBINDEX, TROPONINI in the last 168 hours. BNP: Invalid input(s): POCBNP CBG:  Recent Labs Lab 03/20/16 1113  GLUCAP 98   D-Dimer No results for input(s): DDIMER in the last 72 hours. Hgb A1c No results for input(s): HGBA1C in the last 72 hours. Lipid Profile No results for input(s): CHOL, HDL, LDLCALC, TRIG, CHOLHDL, LDLDIRECT in the last 72 hours. Thyroid function studies No results for input(s): TSH, T4TOTAL, T3FREE, THYROIDAB in the last 72 hours.  Invalid input(s): FREET3 Anemia work up No results for input(s): VITAMINB12, FOLATE, FERRITIN, TIBC, IRON, RETICCTPCT in the last 72 hours. Microbiology No results found for this or any previous visit (from the past 240 hour(s)).    Studies:  Ct Biopsy  Result Date: 03/23/2016 INDICATION: History of T-cell lymphoma. Please perform  CT-guided bone marrow biopsy and aspiration for tissue diagnostic purposes. EXAM: CT-GUIDED BONE MARROW BIOPSY AND ASPIRATION MEDICATIONS: None ANESTHESIA/SEDATION: Fentanyl 100 mcg IV; Versed 4 mg IV Sedation Time: 10 minutes; The patient was continuously monitored during the procedure by the interventional radiology nurse under my direct supervision. COMPLICATIONS: None immediate. PROCEDURE: Informed consent was obtained from the patient following an explanation of the procedure, risks, benefits and alternatives. The patient understands, agrees and consents for the procedure. All questions were addressed. A time out was performed prior to the initiation of the procedure. The patient was positioned prone and non-contrast localization CT was performed of the pelvis to demonstrate the iliac marrow spaces. The operative site was prepped and draped in the usual sterile fashion. Under sterile conditions and local anesthesia, a 22 gauge spinal needle was utilized for procedural planning. Next, an 11 gauge coaxial bone biopsy needle was advanced into the left iliac marrow space. Needle position was confirmed with CT imaging. Initially, bone marrow aspiration was performed. Next, a bone marrow biopsy was obtained with the 11 gauge outer bone marrow device. Samples were prepared with the cytotechnologist and deemed adequate. The needle was removed intact. Hemostasis was obtained with compression and a dressing was placed. The patient tolerated the procedure well without immediate post procedural complication. IMPRESSION: Successful CT guided left iliac bone marrow aspiration and core biopsy. Electronically Signed   By: Sandi Mariscal M.D.   On: 03/23/2016 09:35   Ct Bone Marrow Biopsy & Aspiration  Result Date: 03/23/2016 INDICATION: History of T-cell lymphoma. Please perform CT-guided bone marrow biopsy and aspiration for tissue diagnostic purposes. EXAM: CT-GUIDED BONE MARROW BIOPSY AND ASPIRATION MEDICATIONS: None  ANESTHESIA/SEDATION: Fentanyl 100 mcg IV; Versed 4 mg IV Sedation Time: 10 minutes; The patient was continuously monitored during the procedure by the interventional radiology nurse under my direct supervision. COMPLICATIONS: None immediate. PROCEDURE: Informed consent was obtained from the patient following an explanation of the procedure, risks, benefits and alternatives. The patient understands, agrees and consents for the procedure. All questions were addressed. A time out was performed prior to the initiation of the procedure. The patient was positioned prone and non-contrast localization CT was performed of the pelvis to demonstrate the iliac marrow spaces. The operative site was prepped and draped in the usual sterile fashion. Under sterile conditions and local anesthesia, a 22 gauge spinal needle was utilized for procedural planning. Next, an 11 gauge coaxial bone biopsy needle was advanced into  the left iliac marrow space. Needle position was confirmed with CT imaging. Initially, bone marrow aspiration was performed. Next, a bone marrow biopsy was obtained with the 11 gauge outer bone marrow device. Samples were prepared with the cytotechnologist and deemed adequate. The needle was removed intact. Hemostasis was obtained with compression and a dressing was placed. The patient tolerated the procedure well without immediate post procedural complication. IMPRESSION: Successful CT guided left iliac bone marrow aspiration and core biopsy. Electronically Signed   By: Sandi Mariscal M.D.   On: 03/23/2016 09:35    Assessment: 56 y.o. gentleman with past medical history of diffuse large B cell lymphoma, in remission, now with newly diagnosed T-cell lymphoma.  1. 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 History of diffuse large  B-cell lymphoma, with recurrence, currently in remission. 3. HTN 4. 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 Mild new leukopenia and anemia 6. Hyperglycemia, steroids induced  7. DVT prophylaxis, he is on Lovenox 40 mg daily   Plan: -lab reviewed, pt is doing well, we'll continue chemotherapy EPOCH day 2 today -he is on dexa '10mg'$  before chemo, which is equivalent to prednisone 60 mg, will not order additional prednisone as chemo regimen  -he has developed new hyperglycemia, likely steroids induced, we'll start monitoring his glucose and put him on sliding scale for hyperglucemia, no basal or meal coverage for now -will monitor CBC with diff, CMP and Uric acid daily  -no TLS so far -continue hydration, I encourage pt to drink fluids adequately    Truitt Merle, MD 03/24/2016  10:39 AM

## 2016-03-24 NOTE — Progress Notes (Signed)
Patient with 22 hour bag of chemo infusing, no complications during the night, IVF infusing, adequate urine output during the shift, resting with spouse at bedside, no acute events will continue to monitor.

## 2016-03-25 LAB — CBC WITH DIFFERENTIAL/PLATELET
BASOS ABS: 0 10*3/uL (ref 0.0–0.1)
BASOS PCT: 0 %
EOS ABS: 0 10*3/uL (ref 0.0–0.7)
Eosinophils Relative: 0 %
HEMATOCRIT: 31.7 % — AB (ref 39.0–52.0)
HEMOGLOBIN: 11.1 g/dL — AB (ref 13.0–17.0)
Lymphocytes Relative: 6 %
Lymphs Abs: 0.5 10*3/uL — ABNORMAL LOW (ref 0.7–4.0)
MCH: 29.5 pg (ref 26.0–34.0)
MCHC: 35 g/dL (ref 30.0–36.0)
MCV: 84.3 fL (ref 78.0–100.0)
MONOS PCT: 7 %
Monocytes Absolute: 0.5 10*3/uL (ref 0.1–1.0)
NEUTROS ABS: 6.2 10*3/uL (ref 1.7–7.7)
NEUTROS PCT: 87 %
Platelets: 181 10*3/uL (ref 150–400)
RBC: 3.76 MIL/uL — AB (ref 4.22–5.81)
RDW: 13.2 % (ref 11.5–15.5)
WBC: 7.2 10*3/uL (ref 4.0–10.5)

## 2016-03-25 LAB — URIC ACID: Uric Acid, Serum: 2.9 mg/dL — ABNORMAL LOW (ref 4.4–7.6)

## 2016-03-25 LAB — COMPREHENSIVE METABOLIC PANEL
ALBUMIN: 2.9 g/dL — AB (ref 3.5–5.0)
ALK PHOS: 38 U/L (ref 38–126)
ALT: 78 U/L — ABNORMAL HIGH (ref 17–63)
ANION GAP: 7 (ref 5–15)
AST: 38 U/L (ref 15–41)
BILIRUBIN TOTAL: 0.6 mg/dL (ref 0.3–1.2)
BUN: 11 mg/dL (ref 6–20)
CALCIUM: 8.1 mg/dL — AB (ref 8.9–10.3)
CO2: 25 mmol/L (ref 22–32)
Chloride: 108 mmol/L (ref 101–111)
Creatinine, Ser: 0.61 mg/dL (ref 0.61–1.24)
GLUCOSE: 125 mg/dL — AB (ref 65–99)
POTASSIUM: 3.5 mmol/L (ref 3.5–5.1)
Sodium: 140 mmol/L (ref 135–145)
TOTAL PROTEIN: 5.2 g/dL — AB (ref 6.5–8.1)

## 2016-03-25 MED ORDER — HEPARIN SOD (PORK) LOCK FLUSH 100 UNIT/ML IV SOLN: 250.0000 [IU] | Freq: Once | INTRAVENOUS | Status: DC | PRN

## 2016-03-25 MED ORDER — PALONOSETRON HCL INJECTION 0.25 MG/5ML
0.2500 mg | Freq: Once | INTRAVENOUS | Status: AC
  Administered 2016-03-25: 0.25 mg via INTRAVENOUS
  Filled 2016-03-25: qty 5

## 2016-03-25 MED ORDER — HOT PACK MISC ONCOLOGY
1.0000 | Freq: Once | Status: AC | PRN
  Filled 2016-03-25: qty 1

## 2016-03-25 MED ORDER — SODIUM CHLORIDE 0.9% FLUSH: 3.0000 mL | INTRAVENOUS | Status: DC | PRN

## 2016-03-25 MED ORDER — ALTEPLASE 2 MG IJ SOLR
2.0000 mg | Freq: Once | INTRAMUSCULAR | Status: DC | PRN
  Filled 2016-03-25: qty 2

## 2016-03-25 MED ORDER — HEPARIN SOD (PORK) LOCK FLUSH 100 UNIT/ML IV SOLN: 500.0000 [IU] | Freq: Once | INTRAVENOUS | Status: DC | PRN

## 2016-03-25 MED ORDER — COLD PACK MISC ONCOLOGY
1.0000 | Freq: Once | Status: AC | PRN
  Filled 2016-03-25: qty 1

## 2016-03-25 MED ORDER — VINCRISTINE SULFATE CHEMO INJECTION 1 MG/ML
Freq: Once | INTRAVENOUS | Status: AC
  Administered 2016-03-25: 15:00:00 via INTRAVENOUS
  Filled 2016-03-25 (×2): qty 10

## 2016-03-25 MED ORDER — SODIUM CHLORIDE 0.9 % IV SOLN
10.0000 mg | Freq: Once | INTRAVENOUS | Status: AC
  Administered 2016-03-25: 10 mg via INTRAVENOUS
  Filled 2016-03-25: qty 1

## 2016-03-25 MED ORDER — SODIUM CHLORIDE 0.9% FLUSH: 10.0000 mL | INTRAVENOUS | Status: DC | PRN

## 2016-03-25 MED ORDER — SODIUM CHLORIDE 0.9 % IV SOLN: Freq: Once | INTRAVENOUS | Status: DC

## 2016-03-25 NOTE — Progress Notes (Signed)
Chemo dosages and dilutions verified by 2 RNS

## 2016-03-25 NOTE — Progress Notes (Signed)
Chemo dosages and dilutions calculated and verified by  2 RNs.

## 2016-03-25 NOTE — Progress Notes (Signed)
[name]   DOB:09/03/1960   MR#:[id number]   CSN#:[id number]  ONCOLOGY SERVICE  Subjective: [name] is tolerating chemotherapy very well, day 3 today. He denies any noticeable side effects from chemotherapy. His blood glucose has improved with insulin. He has been ambulating in the hallway, feels well overall. His cough has improved, he feels his lymph nodes in the neck and axillas have gone down in the past few days.   Objective:  Vitals:   03/24/16 2110 03/25/16 0457  BP: 137/80 127/77  Pulse: 74 (!) 59  Resp: 20 18  Temp: 98.1 F (36.7 C) 97.6 F (36.4 C)    Body mass index is 29.65 kg/m.  Intake/Output Summary (Last 24 hours) at 03/25/16 1257 Last data filed at 03/25/16 0700  Gross per 24 hour  Intake           5275.4 ml  Output             5575 ml  Net           -299.6 ml     Sclerae unicteric  Oropharynx clear  (+) Adenopathy in the right neck, bilateral axilla  Lungs clear -- no rales or rhonchi  Heart regular rate and rhythm  Abdomen benign  MSK no focal spinal tenderness, no peripheral edema  Neuro nonfocal   CBG (last 3)  No results for input(s): GLUCAP in the last 72 hours.   Labs:  Lab Results  Component Value Date   WBC 7.2 03/25/2016   HGB 11.1 (L) 03/25/2016   HCT 31.7 (L) 03/25/2016   MCV 84.3 03/25/2016   PLT 181 03/25/2016   NEUTROABS 6.2 03/25/2016   CMP Latest Ref Rng & Units 03/25/2016 03/24/2016 03/22/2016  Glucose 65 - 99 mg/dL 125(H) 228(H) 111(H)  BUN 6 - 20 mg/dL 11 12 19  Creatinine 0.61 - 1.24 mg/dL 0.61 0.78 1.17  Sodium 135 - 145 mmol/L 140 137 137  Potassium 3.5 - 5.1 mmol/L 3.5 3.8 3.9  Chloride 101 - 111 mmol/L 108 107 102  CO2 22 - 32 mmol/L 25 24 27  Calcium 8.9 - 10.3 mg/dL 8.1(L) 8.9 9.4  Total Protein 6.5 - 8.1 g/dL 5.2(L) 5.7(L) 6.4(L)  Total Bilirubin 0.3 - 1.2 mg/dL 0.6 0.8 1.1  Alkaline Phos 38 - 126 U/L 38 47 59  AST 15 - 41 U/L 38 52(H) 71(H)  ALT 17 - 63 U/L 78(H) 93(H) 119(H)   Urine Studies No results for  input(s): UHGB, CRYS in the last 72 hours.  Invalid input(s): UACOL, UAPR, USPG, UPH, UTP, UGL, UKET, UBIL, UNIT, UROB, ULEU, UEPI, UWBC, URBC, UBAC, CAST, UCOM, BILUA  Basic Metabolic Panel:  Recent Labs Lab 03/20/16 0958  03/22/16 0906 03/24/16 0512 03/25/16 0500  NA 142  --  137 137 140  K 4.9  < > 3.9 3.8 3.5  CL  --   --  102 107 108  CO2 30*  --  27 24 25  GLUCOSE 105  --  111* 228* 125*  BUN 14.7  --  19 12 11  CREATININE 1.1  --  1.17 0.78 0.61  CALCIUM 10.2  --  9.4 8.9 8.1*  < > = values in this interval not displayed. GFR Estimated Creatinine Clearance: 112.7 mL/min (by C-G formula based on SCr of 0.61 mg/dL). Liver Function Tests:  Recent Labs Lab 03/20/16 0958 03/22/16 0906 03/24/16 0512 03/25/16 0500  AST 88* 71* 52* 38  ALT 151* 119* 93* 78*  ALKPHOS 72   59 47 38  BILITOT 1.48* 1.1 0.8 0.6  PROT 7.1 6.4* 5.7* 5.2*  ALBUMIN 3.8 3.6 3.1* 2.9*   No results for input(s): LIPASE, AMYLASE in the last 168 hours. No results for input(s): AMMONIA in the last 168 hours. Coagulation profile  Recent Labs Lab 03/21/16 1151  INR 1.10    CBC:  Recent Labs Lab 03/21/16 1151 03/22/16 0906 03/23/16 0500 03/25/16 0500  WBC 4.0 4.0 2.7* 7.2  NEUTROABS 2.8 2.8 1.6* 6.2  HGB 14.9 13.1 11.8* 11.1*  HCT 42.9 38.2* 34.8* 31.7*  MCV 85.1 84.7 85.3 84.3  PLT 166 168 142* 181   Cardiac Enzymes: No results for input(s): CKTOTAL, CKMB, CKMBINDEX, TROPONINI in the last 168 hours. BNP: Invalid input(s): POCBNP CBG:  Recent Labs Lab 03/20/16 1113  GLUCAP 98   D-Dimer No results for input(s): DDIMER in the last 72 hours. Hgb A1c No results for input(s): HGBA1C in the last 72 hours. Lipid Profile No results for input(s): CHOL, HDL, LDLCALC, TRIG, CHOLHDL, LDLDIRECT in the last 72 hours. Thyroid function studies No results for input(s): TSH, T4TOTAL, T3FREE, THYROIDAB in the last 72 hours.  Invalid input(s): FREET3 Anemia work up No results for input(s):  VITAMINB12, FOLATE, FERRITIN, TIBC, IRON, RETICCTPCT in the last 72 hours. Microbiology No results found for this or any previous visit (from the past 240 hour(s)).    Studies:  No results found.  Assessment: 56 y.o. gentleman with past medical history of diffuse large B cell lymphoma, in remission, now with newly diagnosed T-cell lymphoma.  1. 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 History of diffuse large B-cell lymphoma, with recurrence, currently in remission. 3. HTN 4. 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 Mild anemia 6. Hyperglycemia, steroids induced, improved  7. DVT prophylaxis, he is on Lovenox 40 mg daily   Plan: -lab reviewed, pt is doing well, we'll continue chemotherapy EPOCH day 3 today -Continue monitoring his blood glucose before each meal and no bedtime, and covers hyperglycemia with insulin sliding scale -will monitor CMP and Uric acid daily  -no TLS so far -Continue DVT prophylaxis with Lovenox -Dr. [name] will resume care tomorrow   Truitt Merle, MD 03/25/2016  12:57 PM

## 2016-03-26 DIAGNOSIS — R739 Hyperglycemia, unspecified: Secondary | ICD-10-CM

## 2016-03-26 LAB — GLUCOSE, CAPILLARY
GLUCOSE-CAPILLARY: 159 mg/dL — AB (ref 65–99)
GLUCOSE-CAPILLARY: 145 mg/dL — AB (ref 65–99)
GLUCOSE-CAPILLARY: 233 mg/dL — AB (ref 65–99)
GLUCOSE-CAPILLARY: 141 mg/dL — AB (ref 65–99)
Glucose-Capillary: 132 mg/dL — ABNORMAL HIGH (ref 65–99)
Glucose-Capillary: 97 mg/dL (ref 65–99)
Glucose-Capillary: 241 mg/dL — ABNORMAL HIGH (ref 65–99)
Glucose-Capillary: 105 mg/dL — ABNORMAL HIGH (ref 65–99)

## 2016-03-26 MED ORDER — VINCRISTINE SULFATE CHEMO INJECTION 1 MG/ML
Freq: Once | INTRAVENOUS | Status: AC
  Administered 2016-03-26: 15:00:00 via INTRAVENOUS
  Filled 2016-03-26: qty 10

## 2016-03-26 MED ORDER — HEPARIN SOD (PORK) LOCK FLUSH 100 UNIT/ML IV SOLN: 500.0000 [IU] | Freq: Once | INTRAVENOUS | Status: DC | PRN

## 2016-03-26 MED ORDER — SODIUM CHLORIDE 0.9% FLUSH: 3.0000 mL | INTRAVENOUS | Status: DC | PRN

## 2016-03-26 MED ORDER — SODIUM CHLORIDE 0.9% FLUSH: 10.0000 mL | INTRAVENOUS | Status: DC | PRN

## 2016-03-26 MED ORDER — ALTEPLASE 2 MG IJ SOLR: 2.0000 mg | Freq: Once | INTRAMUSCULAR | Status: DC | PRN

## 2016-03-26 MED ORDER — SODIUM CHLORIDE 0.9 % IV SOLN
10.0000 mg | Freq: Once | INTRAVENOUS | Status: AC
  Administered 2016-03-26: 10 mg via INTRAVENOUS
  Filled 2016-03-26: qty 1

## 2016-03-26 MED ORDER — SODIUM CHLORIDE 0.9 % IV SOLN: Freq: Once | INTRAVENOUS | Status: DC

## 2016-03-26 MED ORDER — HOT PACK MISC ONCOLOGY: 1.0000 | Freq: Once | Status: AC | PRN

## 2016-03-26 MED ORDER — COLD PACK MISC ONCOLOGY: 1.0000 | Freq: Once | Status: AC | PRN

## 2016-03-26 MED ORDER — HEPARIN SOD (PORK) LOCK FLUSH 100 UNIT/ML IV SOLN: 250.0000 [IU] | Freq: Once | INTRAVENOUS | Status: DC | PRN

## 2016-03-26 NOTE — Progress Notes (Signed)
IP PROGRESS NOTE  Subjective:   He started chemotherapy 03/23/2016. No nausea, mouth sores, or diarrhea. The cough has resolved. The palpable lymphadenopathy has improved. He is ambulating.  Objective: Vital signs in last 24 hours: Blood pressure (!) 135/101, pulse 65, temperature 97.8 F (36.6 C), temperature source Oral, resp. rate 16, height '5\' 8"'$  (1.727 m), weight 195 lb (88.5 kg), SpO2 98 %.  Intake/Output from previous day: 03/04 0701 - 03/05 0700 In: 4403.3 [P.O.:480; I.V.:3450; IV Piggyback:473.3] Out: 4100 [Urine:4100]  Physical Exam: HEENT: No thrush or ulcers Lungs: Clear bilaterally, no respiratory distress Cardiac: Regular rate and rhythm Abdomen: No hepatosplenomegaly, nontender Extremities: No leg edema Lymph nodes: Bilateral cervical and axillary nodes have decreased in size. Femoral/inguinal nodes are decreased in size.   Portacath/PICC-without erythema  Lab Results:  Recent Labs  03/25/16 0500  WBC 7.2  HGB 11.1*  HCT 31.7*  PLT 181    BMET  Recent Labs  03/24/16 0512 03/25/16 0500  NA 137 140  K 3.8 3.5  CL 107 108  CO2 24 25  GLUCOSE 228* 125*  BUN 12 11  CREATININE 0.78 0.61  CALCIUM 8.9 8.1*    Studies/Results: No results found.  Medications: I have reviewed the patient's current medications.  Assessment/Plan: 1. 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  PET scan 03/20/2016 with extensive bulky hypermetabolic nodal activity in the neck, chest, abdomen and pelvis; mild splenomegaly with diffusely mildly accentuated splenic  activity  Staging bone marrow biopsy 03/23/2016  Cycle 1 EPOCH 03/23/2016 2. 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 He entered clinical remission following chemotherapy and remained in remission when he was seen at the cancer center 02/24/2009. 3. 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 Staging PET scan 08/23/2010 with increased FDG activity at the left tonsillar fossa and no additional evidence of lymphoma. He completed 4 cycles of R-ICE with cycle #1 beginning on 09/19/2010 and cycle #4 on 11/21/2010. Repeat head and neck examination by Dr. Constance Holster following R-ICE/rituximab showed no residual lymphoma. He began radiation consolidation on 12/18/2010, radiation was completed on 01/22/2011. 4. History of neutropenia secondary to rituximab. 5. Anemia secondary to chemotherapy, status post a red blood cell transfusion 11/30/2010. The hemoglobin has normalized. 6. History of Mild thrombocytopenia secondary to chemotherapy. 7. Hypertension. 8. Left thigh mass-status post surgical excision [facility] November 2014 confirming a schwannoma 9. Port-A-Cath placement 03/21/2016 10. Hyperglycemia secondary to Decadron-mild, discontinue insulin sliding scale  Mr. Crochet appears to be tolerating the chemotherapy well. The palpable lymphadenopathy has improved. There is no evidence for tumor lysis syndrome. He will continue intravenous hydration. He will begin day 4 of infusional chemotherapy today.  He has mild hyperglycemia, likely secondary to Decadron. I will discontinue the insulin sliding scale.  We will follow-up on the bone marrow biopsy result. He should complete the current cycle of chemotherapy on 03/27/2016. He will be scheduled for Neulasta 03/28/2016.    LOS: 4  days   [name], MD   03/26/2016, 10:41 AM

## 2016-03-26 NOTE — Progress Notes (Signed)
Chemo dosages and dilutions verified by 2 RNs.  

## 2016-03-27 ENCOUNTER — Telehealth: Payer: Self-pay | Admitting: Oncology

## 2016-03-27 ENCOUNTER — Other Ambulatory Visit: Payer: Self-pay | Admitting: *Deleted

## 2016-03-27 ENCOUNTER — Other Ambulatory Visit: Payer: Self-pay | Admitting: Nurse Practitioner

## 2016-03-27 DIAGNOSIS — C8590 Non-Hodgkin lymphoma, unspecified, unspecified site: Secondary | ICD-10-CM

## 2016-03-27 LAB — CBC WITH DIFFERENTIAL/PLATELET
BASOS ABS: 0 10*3/uL (ref 0.0–0.1)
BASOS PCT: 0 %
EOS ABS: 0.1 10*3/uL (ref 0.0–0.7)
Eosinophils Relative: 1 %
HCT: 37 % — ABNORMAL LOW (ref 39.0–52.0)
Hemoglobin: 13 g/dL (ref 13.0–17.0)
Lymphocytes Relative: 17 %
Lymphs Abs: 1 10*3/uL (ref 0.7–4.0)
MCH: 28.9 pg (ref 26.0–34.0)
MCHC: 35.1 g/dL (ref 30.0–36.0)
MCV: 82.2 fL (ref 78.0–100.0)
MONOS PCT: 3 %
Monocytes Absolute: 0.2 10*3/uL (ref 0.1–1.0)
Neutro Abs: 4.7 10*3/uL (ref 1.7–7.7)
Neutrophils Relative %: 79 %
PLATELETS: 266 10*3/uL (ref 150–400)
RBC: 4.5 MIL/uL (ref 4.22–5.81)
RDW: 13 % (ref 11.5–15.5)
WBC: 5.9 10*3/uL (ref 4.0–10.5)

## 2016-03-27 LAB — COMPREHENSIVE METABOLIC PANEL
ALBUMIN: 3.6 g/dL (ref 3.5–5.0)
ALK PHOS: 40 U/L (ref 38–126)
ALT: 156 U/L — AB (ref 17–63)
ANION GAP: 8 (ref 5–15)
AST: 85 U/L — ABNORMAL HIGH (ref 15–41)
BILIRUBIN TOTAL: 1.3 mg/dL — AB (ref 0.3–1.2)
BUN: 15 mg/dL (ref 6–20)
CALCIUM: 9.1 mg/dL (ref 8.9–10.3)
CO2: 26 mmol/L (ref 22–32)
CREATININE: 0.63 mg/dL (ref 0.61–1.24)
Chloride: 103 mmol/L (ref 101–111)
GFR calc non Af Amer: >60 mL/min (ref >60)
GLUCOSE: 124 mg/dL — AB (ref 65–99)
Potassium: 3 mmol/L — ABNORMAL LOW (ref 3.5–5.1)
SODIUM: 137 mmol/L (ref 135–145)
TOTAL PROTEIN: 6.4 g/dL — AB (ref 6.5–8.1)

## 2016-03-27 LAB — LACTATE DEHYDROGENASE: LDH: 251 U/L — ABNORMAL HIGH (ref 98–192)

## 2016-03-27 MED ORDER — SODIUM CHLORIDE 0.9% FLUSH: 3.0000 mL | INTRAVENOUS | Status: DC | PRN

## 2016-03-27 MED ORDER — HOT PACK MISC ONCOLOGY: 1.0000 | Freq: Once | Status: DC | PRN

## 2016-03-27 MED ORDER — CYCLOPHOSPHAMIDE CHEMO INJECTION 1 GM
750.0000 mg/m2 | Freq: Once | INTRAMUSCULAR | Status: AC
  Administered 2016-03-27: 1560 mg via INTRAVENOUS
  Filled 2016-03-27: qty 78

## 2016-03-27 MED ORDER — ALTEPLASE 2 MG IJ SOLR: 2.0000 mg | Freq: Once | INTRAMUSCULAR | Status: DC | PRN

## 2016-03-27 MED ORDER — SODIUM CHLORIDE 0.9 % IV SOLN: Freq: Once | INTRAVENOUS | Status: DC

## 2016-03-27 MED ORDER — HEPARIN SOD (PORK) LOCK FLUSH 100 UNIT/ML IV SOLN
500.0000 [IU] | Freq: Once | INTRAVENOUS | Status: DC | PRN
  Filled 2016-03-27: qty 5

## 2016-03-27 MED ORDER — COLD PACK MISC ONCOLOGY: 1.0000 | Freq: Once | Status: DC | PRN

## 2016-03-27 MED ORDER — ONDANSETRON HCL 8 MG PO TABS: 8.0000 mg | ORAL_TABLET | Freq: Four times a day (QID) | ORAL | 2 refills | Status: DC | PRN

## 2016-03-27 MED ORDER — SODIUM CHLORIDE 0.9 % IV SOLN
10.0000 mg | Freq: Once | INTRAVENOUS | Status: AC
  Administered 2016-03-27: 10 mg via INTRAVENOUS
  Filled 2016-03-27: qty 1

## 2016-03-27 MED ORDER — PALONOSETRON HCL INJECTION 0.25 MG/5ML
0.2500 mg | Freq: Once | INTRAVENOUS | Status: AC
  Administered 2016-03-27: 0.25 mg via INTRAVENOUS
  Filled 2016-03-27: qty 5

## 2016-03-27 MED ORDER — SODIUM CHLORIDE 0.9% FLUSH: 10.0000 mL | INTRAVENOUS | Status: DC | PRN

## 2016-03-27 MED ORDER — HEPARIN SOD (PORK) LOCK FLUSH 100 UNIT/ML IV SOLN: 250.0000 [IU] | Freq: Once | INTRAVENOUS | Status: DC | PRN

## 2016-03-27 MED ORDER — PROCHLORPERAZINE EDISYLATE 5 MG/ML IJ SOLN
10.0000 mg | Freq: Once | INTRAMUSCULAR | Status: AC
  Administered 2016-03-27: 10 mg via INTRAVENOUS
  Filled 2016-03-27: qty 2

## 2016-03-27 NOTE — Progress Notes (Signed)
Nursing Note: Pt has 24 h chemo infusing via his port.Lab in to draw am labs and pt refused.He says he will wait till the Doctor comes in this am.wbb

## 2016-03-27 NOTE — Telephone Encounter (Signed)
R/s injection from 3/7 to 3/8 Per Rn Stacey . Rn already talked to inpatient Rn - in hospital and patient is aware of  Time and schedule change

## 2016-03-27 NOTE — Discharge Summary (Signed)
Physician Discharge Summary  Patient ID: [name] _0 @ MRN: [id number] DOB/AGE: [birth year] 56 y.o.  Admit date: 03/22/2016 Discharge date: 03/27/2016  Discharge Diagnoses:  Active Problems:   1. T-cell lymphoma ([place])    Discharged Condition: Improved  Significant Diagnostic Studies: None  Consults: None  Procedures:  Bone marrow biopsy, infusional chemotherapy  Disposition: 01-Home or Self Care   Allergies as of 03/27/2016      Reactions   Penicillins Other (See Comments)   Childhood allergy reaction unknown  Has patient had a PCN reaction causing immediate rash, facial/tongue/throat swelling, SOB or lightheadedness with hypotension: Unknown Has patient had a PCN reaction causing severe rash involving mucus membranes or skin necrosis: Unknown Has patient had a PCN reaction that required hospitalization: Unknown Has patient had a PCN reaction occurring within the last 10 years: Unknown If all of the above answers are "NO", then may proceed with Cephalosporin use.      Medication List    TAKE these medications   acetaminophen 500 MG tablet Commonly known as:  TYLENOL Take 1,000 mg by mouth every 6 (six) hours as needed for mild pain, moderate pain, fever or headache.   allopurinol 300 MG tablet Commonly known as:  ZYLOPRIM Take 1 tablet (300 mg total) by mouth daily.   atenolol 50 MG tablet Commonly known as:  TENORMIN Take 50 mg by mouth daily.   Elderberry 575 MG/5ML Syrp Take 5 mLs by mouth daily.   naproxen sodium 220 MG tablet Commonly known as:  ANAPROX Take 220 mg by mouth every 12 (twelve) hours as needed (for pain/fever).   ondansetron 8 MG tablet Commonly known as:  ZOFRAN Take 1 tablet (8 mg total) by mouth every 6 (six) hours as needed for nausea.       Follow-up Information    Don Coder, MD Follow up.   Specialty:  Oncology Contact information: [place] [address] [place] Hospital Course: [name] was admitted electively on 03/22/2016 for cycle 1 chemotherapy for treatment of peripheral T-cell lymphoma. He received intravenous hydration on the day of admission and on 03/23/2016 began cycle 1 EPOCH. He underwent a diagnostic bone marrow biopsy on 03/23/2016 prior to beginning chemotherapy.  [name] tolerated chemotherapy without significant acute toxicity. There was no evidence of tumor lysis syndrome. He had mild nausea, relieved with Zofran. He maintained good urine output throughout this admission.  The cough and sweats present on hospital admission improved. The palpable lymphadenopathy was smaller.  He completed chemotherapy on 03/27/2016 and was discharged to home. He will return to the Cancer center for a Neulasta injection on 03/28/2016.  He will [facility] the [facility] for an office and lab visit on 04/02/2016.     Discharge Instructions    SCHEDULING COMMUNICATION    Complete by:  As directed    Chemotherapy Appointment - 2 hr   SCHEDULING COMMUNICATION    Complete by:  As directed    Chemotherapy Appointment - 2 hr   SCHEDULING COMMUNICATION    Complete by:  As directed    Chemotherapy Appointment - 2 hr   SCHEDULING COMMUNICATION    Complete by:  As directed    Chemotherapy Appointment - 2.5 hr   [facility] NURSING COMMUNICATION    Complete by:  As directed    May order Prednisone 36m/m2 po to be given in clinic if patient did not take at home.  [facility] NURSING COMMUNICATION    Complete by:  As directed    May order Prednisone 26m/m2 po to be given in clinic if patient did not take at home.   [facility] NURSING COMMUNICATION    Complete by:  As directed    May order Prednisone 654mm2 po to be given in clinic if patient did not take at home.   [facility] NURSING COMMUNICATION    Complete by:  As directed    May order Prednisone 6086m2 po to be given in clinic if patient did not take at home.   TREATMENT CONDITIONS    Complete by:   As directed    Patient should have CBC & CMP within 7 days prior to chemotherapy administration. NOTIFY MD IF: ANC < 1500, Hemoglobin < 8, PLT < 100,000,  Total Bili > 1.5, Creatinine > 1.5, ALT & AST > 80 or if patient has unstable vital signs: Temperature > 38.5, SBP > 180 or < 90, RR > 30 or HR > 100.   TREATMENT CONDITIONS    Complete by:  As directed    Patient should have CBC & CMP within 7 days prior to chemotherapy administration. NOTIFY MD IF: ANC < 1500, Hemoglobin < 8, PLT < 100,000,  Total Bili > 1.5, Creatinine > 1.5, ALT & AST > 80 or if patient has unstable vital signs: Temperature > 38.5, SBP > 180 or < 90, RR > 30 or HR > 100.      Signed: SHEBetsy CoderD 03/27/2016, 2:02 PM

## 2016-03-27 NOTE — Discharge Instructions (Signed)
Call for a fever, bleeding, or shortness of breath °

## 2016-03-27 NOTE — Progress Notes (Signed)
Independent checks completed with Drue Dun, RN to ensure that correct dosing and BSA was used for patient's Cytoxan dose.

## 2016-03-27 NOTE — Progress Notes (Signed)
Nursing Discharge Summary  Patient ID: KVION LELL MRN: [id number] DOB/AGE: 04/26/1960 56 y.o.  Admit date: 03/22/2016 Discharge date: 03/27/2016  Discharged Condition: good  Disposition: 01-Home or Self Care  Follow-up Information    [name], MD Follow up.   Specialty:  Oncology Contact information: [address] [place] [address] [phone]           Prescriptions Given: Prescription for Zofran called into pharmacy which patient's wife already picked up.  Medications and follow up appointments discussed with patient and wife.  Both verbalized understanding without further questions.   Means of Discharge: Patient to be taken downstairs via wheelchair to be discharged home via private vehicle.   Signed: Buel Ream 03/27/2016, 4:48 PM

## 2016-03-28 ENCOUNTER — Telehealth: Payer: Self-pay | Admitting: *Deleted

## 2016-03-28 ENCOUNTER — Ambulatory Visit: Payer: Self-pay

## 2016-03-28 NOTE — Telephone Encounter (Signed)
Message sent to Charlie Pitter in Medical Records regarding referral.

## 2016-03-28 NOTE — Telephone Encounter (Signed)
Received call from wife Magda Paganini stating that pt was discharged home from hospital yesterday.  Magda Paganini inquired about referral appt to [facility] as informed by Dr. [name] while pt still in the hospital.   [facility]   Cell  Phone    [phone].

## 2016-03-29 ENCOUNTER — Other Ambulatory Visit: Payer: Self-pay | Admitting: *Deleted

## 2016-03-29 ENCOUNTER — Telehealth: Payer: Self-pay | Admitting: Oncology

## 2016-03-29 ENCOUNTER — Telehealth: Payer: Self-pay | Admitting: Hematology and Oncology

## 2016-03-29 ENCOUNTER — Ambulatory Visit (HOSPITAL_BASED_OUTPATIENT_CLINIC_OR_DEPARTMENT_OTHER): Payer: 59

## 2016-03-29 VITALS — BP 103/65 | HR 55 | Temp 98.6°F | Resp 18

## 2016-03-29 DIAGNOSIS — Z5189 Encounter for other specified aftercare: Secondary | ICD-10-CM

## 2016-03-29 DIAGNOSIS — C8590 Non-Hodgkin lymphoma, unspecified, unspecified site: Secondary | ICD-10-CM | POA: Diagnosis not present

## 2016-03-29 MED ORDER — DEXAMETHASONE 4 MG PO TABS: ORAL_TABLET | ORAL | 0 refills | Status: DC

## 2016-03-29 MED ORDER — LIDOCAINE-PRILOCAINE 2.5-2.5 % EX CREA: 1.0000 "application " | TOPICAL_CREAM | CUTANEOUS | 1 refills | Status: AC | PRN

## 2016-03-29 MED ORDER — PROCHLORPERAZINE MALEATE 10 MG PO TABS: 10.0000 mg | ORAL_TABLET | Freq: Four times a day (QID) | ORAL | 0 refills | Status: DC | PRN

## 2016-03-29 MED ORDER — LIDOCAINE-PRILOCAINE 2.5-2.5 % EX CREA: 1.0000 "application " | TOPICAL_CREAM | CUTANEOUS | 0 refills | Status: DC | PRN

## 2016-03-29 MED ORDER — PEGFILGRASTIM INJECTION 6 MG/0.6ML ~~LOC~~
6.0000 mg | PREFILLED_SYRINGE | Freq: Once | SUBCUTANEOUS | Status: AC
  Administered 2016-03-29: 6 mg via SUBCUTANEOUS
  Filled 2016-03-29: qty 0.6

## 2016-03-29 NOTE — Progress Notes (Signed)
Patient complains of nausea and states zofran is not relieving nausea. Dr. [name] notified. Dexamethasone 8 mg BID x 2 days, Compazine 10 mg q 6 hours and EMLA cream e-scribed to pharmacy. Patient and spouse verbalized understanding.

## 2016-03-29 NOTE — Telephone Encounter (Signed)
Faxed records to Dr Wetzel Bjornstad office. Waiting for appt.

## 2016-03-29 NOTE — Patient Instructions (Signed)
Pegfilgrastim injection (Neulasta) What is this medicine? PEGFILGRASTIM (PEG fil gra stim) is a long-acting granulocyte colony-stimulating factor that stimulates the growth of neutrophils, a type of white blood cell important in the body's fight against infection. It is used to reduce the incidence of fever and infection in patients with certain types of cancer who are receiving chemotherapy that affects the bone marrow, and to increase survival after being exposed to high doses of radiation. This medicine may be used for other purposes; ask your health care provider or pharmacist if you have questions. COMMON BRAND NAME(S): Neulasta What should I tell my health care provider before I take this medicine? They need to know if you have any of these conditions: -kidney disease -latex allergy -ongoing radiation therapy -sickle cell disease -skin reactions to acrylic adhesives (On-Body Injector only) -an unusual or allergic reaction to pegfilgrastim, filgrastim, other medicines, foods, dyes, or preservatives -pregnant or trying to get pregnant -breast-feeding How should I use this medicine? This medicine is for injection under the skin. If you get this medicine at home, you will be taught how to prepare and give the pre-filled syringe or how to use the On-body Injector. Refer to the patient Instructions for Use for detailed instructions. Use exactly as directed. Tell your healthcare provider immediately if you suspect that the On-body Injector may not have performed as intended or if you suspect the use of the On-body Injector resulted in a missed or partial dose. It is important that you put your used needles and syringes in a special sharps container. Do not put them in a trash can. If you do not have a sharps container, call your pharmacist or healthcare provider to get one. Talk to your pediatrician regarding the use of this medicine in children. While this drug may be prescribed for selected  conditions, precautions do apply. Overdosage: If you think you have taken too much of this medicine contact a poison control center or emergency room at once. NOTE: This medicine is only for you. Do not share this medicine with others. What if I miss a dose? It is important not to miss your dose. Call your doctor or health care professional if you miss your dose. If you miss a dose due to an On-body Injector failure or leakage, a new dose should be administered as soon as possible using a single prefilled syringe for manual use. What may interact with this medicine? Interactions have not been studied. Give your health care provider a list of all the medicines, herbs, non-prescription drugs, or dietary supplements you use. Also tell them if you smoke, drink alcohol, or use illegal drugs. Some items may interact with your medicine. This list may not describe all possible interactions. Give your health care provider a list of all the medicines, herbs, non-prescription drugs, or dietary supplements you use. Also tell them if you smoke, drink alcohol, or use illegal drugs. Some items may interact with your medicine. What should I watch for while using this medicine? You may need blood work done while you are taking this medicine. If you are going to need a MRI, CT scan, or other procedure, tell your doctor that you are using this medicine (On-Body Injector only). What side effects may I notice from receiving this medicine? Side effects that you should report to your doctor or health care professional as soon as possible: -allergic reactions like skin rash, itching or hives, swelling of the face, lips, or tongue -dizziness -fever -pain, redness, or irritation at   site where injected -pinpoint red spots on the skin -red or dark-brown urine -shortness of breath or breathing problems -stomach or side pain, or pain at the shoulder -swelling -tiredness -trouble passing urine or change in the amount of  urine Side effects that usually do not require medical attention (report to your doctor or health care professional if they continue or are bothersome): -bone pain -muscle pain This list may not describe all possible side effects. Call your doctor for medical advice about side effects. You may report side effects to FDA at 1-800-FDA-1088. Where should I keep my medicine? Keep out of the reach of children. Store pre-filled syringes in a refrigerator between 2 and 8 degrees C (36 and 46 degrees F). Do not freeze. Keep in carton to protect from light. Throw away this medicine if it is left out of the refrigerator for more than 48 hours. Throw away any unused medicine after the expiration date. NOTE: This sheet is a summary. It may not cover all possible information. If you have questions about this medicine, talk to your doctor, pharmacist, or health care provider.  2018 Elsevier/Gold Standard (2016-01-05 12:58:03)  

## 2016-03-29 NOTE — Telephone Encounter (Signed)
Disregard last telephone note.

## 2016-03-29 NOTE — Telephone Encounter (Signed)
Faxed records to [place] office.  The pt records will be given to the review team. 24 to 48 hrs turnaround time.

## 2016-03-30 LAB — CHROMOSOME ANALYSIS, BONE MARROW

## 2016-04-03 ENCOUNTER — Ambulatory Visit (HOSPITAL_BASED_OUTPATIENT_CLINIC_OR_DEPARTMENT_OTHER): Payer: 59

## 2016-04-03 ENCOUNTER — Telehealth: Payer: Self-pay | Admitting: Oncology

## 2016-04-03 ENCOUNTER — Other Ambulatory Visit (HOSPITAL_BASED_OUTPATIENT_CLINIC_OR_DEPARTMENT_OTHER): Payer: 59

## 2016-04-03 ENCOUNTER — Ambulatory Visit (HOSPITAL_BASED_OUTPATIENT_CLINIC_OR_DEPARTMENT_OTHER): Payer: 59 | Admitting: Nurse Practitioner

## 2016-04-03 VITALS — BP 111/63 | HR 76 | Temp 98.6°F | Resp 18 | Ht 68.0 in | Wt 191.3 lb

## 2016-04-03 DIAGNOSIS — D701 Agranulocytosis secondary to cancer chemotherapy: Secondary | ICD-10-CM

## 2016-04-03 DIAGNOSIS — R110 Nausea: Secondary | ICD-10-CM

## 2016-04-03 DIAGNOSIS — Z95828 Presence of other vascular implants and grafts: Secondary | ICD-10-CM | POA: Insufficient documentation

## 2016-04-03 DIAGNOSIS — Z8572 Personal history of non-Hodgkin lymphomas: Secondary | ICD-10-CM

## 2016-04-03 DIAGNOSIS — C866 Primary cutaneous CD30-positive T-cell proliferations: Secondary | ICD-10-CM

## 2016-04-03 DIAGNOSIS — C8590 Non-Hodgkin lymphoma, unspecified, unspecified site: Secondary | ICD-10-CM

## 2016-04-03 DIAGNOSIS — G620 Drug-induced polyneuropathy: Secondary | ICD-10-CM

## 2016-04-03 LAB — COMPREHENSIVE METABOLIC PANEL
ALT: 53 U/L (ref 0–55)
AST: 17 U/L (ref 5–34)
Albumin: 3.5 g/dL (ref 3.5–5.0)
Alkaline Phosphatase: 39 U/L — ABNORMAL LOW (ref 40–150)
Anion Gap: 9 mEq/L (ref 3–11)
BUN: 14.7 mg/dL (ref 7.0–26.0)
CHLORIDE: 103 meq/L (ref 98–109)
CO2: 26 meq/L (ref 22–29)
CREATININE: 1 mg/dL (ref 0.7–1.3)
Calcium: 9.1 mg/dL (ref 8.4–10.4)
EGFR: 83 mL/min/{1.73_m2} — ABNORMAL LOW (ref >90)
Glucose: 106 mg/dl (ref 70–140)
Potassium: 4.1 mEq/L (ref 3.5–5.1)
Sodium: 139 mEq/L (ref 136–145)
Total Bilirubin: 1.64 mg/dL — ABNORMAL HIGH (ref 0.20–1.20)
Total Protein: 5.9 g/dL — ABNORMAL LOW (ref 6.4–8.3)

## 2016-04-03 LAB — CBC WITH DIFFERENTIAL/PLATELET
BASO%: 1.1 % (ref 0.0–2.0)
Basophils Absolute: 0 10*3/uL (ref 0.0–0.1)
EOS%: 9.2 % — ABNORMAL HIGH (ref 0.0–7.0)
Eosinophils Absolute: 0.1 10*3/uL (ref 0.0–0.5)
HCT: 35.6 % — ABNORMAL LOW (ref 38.4–49.9)
HGB: 12.2 g/dL — ABNORMAL LOW (ref 13.0–17.1)
LYMPH%: 24.7 % (ref 14.0–49.0)
MCH: 28.6 pg (ref 27.2–33.4)
MCHC: 34.1 g/dL (ref 32.0–36.0)
MCV: 83.9 fL (ref 79.3–98.0)
MONO#: 0.2 10*3/uL (ref 0.1–0.9)
MONO%: 22.1 % — AB (ref 0.0–14.0)
NEUT#: 0.5 10*3/uL — CL (ref 1.5–6.5)
NEUT%: 42.9 % (ref 39.0–75.0)
Platelets: 103 10*3/uL — ABNORMAL LOW (ref 140–400)
RBC: 4.25 10*6/uL (ref 4.20–5.82)
RDW: 13.4 % (ref 11.0–14.6)
WBC: 1.1 10*3/uL — ABNORMAL LOW (ref 4.0–10.3)
lymph#: 0.3 10*3/uL — ABNORMAL LOW (ref 0.9–3.3)

## 2016-04-03 LAB — LACTATE DEHYDROGENASE: LDH: 266 U/L — ABNORMAL HIGH (ref 125–245)

## 2016-04-03 MED ORDER — HEPARIN SOD (PORK) LOCK FLUSH 100 UNIT/ML IV SOLN
500.0000 [IU] | Freq: Once | INTRAVENOUS | Status: AC | PRN
  Administered 2016-04-03: 500 [IU] via INTRAVENOUS
  Filled 2016-04-03: qty 5

## 2016-04-03 MED ORDER — SODIUM CHLORIDE 0.9% FLUSH
10.0000 mL | INTRAVENOUS | Status: DC | PRN
  Administered 2016-04-03: 10 mL via INTRAVENOUS
  Filled 2016-04-03: qty 10

## 2016-04-03 MED ORDER — LORAZEPAM 0.5 MG PO TABS: 0.5000 mg | ORAL_TABLET | Freq: Every evening | ORAL | 0 refills | Status: DC | PRN

## 2016-04-03 NOTE — Progress Notes (Addendum)
[place] OFFICE PROGRESS NOTE   Diagnosis:  T-cell lymphoma  INTERVAL HISTORY:   Mr. Suess returns as scheduled. He completed cycle 1 EPOCH beginning 03/23/2016. Nodes are significantly smaller. Fever and sweats have resolved. He notes persistent abdominal fullness mainly on the left side. He developed significant nausea during the Cytoxan infusion. He continues to have intermittent nausea. He reports a good appetite but is having difficulty eating due to the nausea. He is taking Zofran and Compazine with partial relief. He did not try the dexamethasone as prescribed last week. Bowels are moving. He is having difficulty sleeping.  Objective:  Vital signs in last 24 hours:  Blood pressure 111/63, pulse 76, temperature 98.6 F (37 C), temperature source Oral, resp. rate 18, height '5\' 8"'$  (1.727 m), weight 191 lb 4.8 oz (86.8 kg), SpO2 100 %.    HEENT: No thrush or ulcers. Lymphatics: Small bilateral low neck/scalene lymph nodes. No right axillary adenopathy. 2-3 cm left axillary lymph node. One to 2 cm left inguinal lymph node. Resp: Lungs clear bilaterally. Cardio: Regular rate and rhythm. GI: Abdomen soft, nontender. Slight fullness left mid to lower abdomen. Vascular: No leg edema. Neuro: Alert and oriented.  Skin: Faint erythema over the lower abdominal wall. Port-A-Cath site without erythema.    Lab Results:  Lab Results  Component Value Date   WBC 1.1 (L) 04/03/2016   HGB 12.2 (L) 04/03/2016   HCT 35.6 (L) 04/03/2016   MCV 83.9 04/03/2016   PLT 103 (L) 04/03/2016   NEUTROABS 0.5 (LL) 04/03/2016    Imaging:  No results found.  Medications: I have reviewed the patient's current medications.  Assessment/Plan: 1. 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  PET scan 03/20/2016 with extensive bulky hypermetabolic nodal activity in the neck, chest, abdomen and pelvis; mild splenomegaly with diffusely mildly accentuated splenic activity  Staging bone marrow biopsy 03/23/2016-negative for involvement with lymphoma  Cycle 1 EPOCH beginning 03/23/2016 2. 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 He entered clinical remission following chemotherapy and remained in remission when he was seen at the cancer center 02/24/2009. 3. 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 Staging PET scan 08/23/2010 with increased FDG activity at the left tonsillar fossa and no additional evidence of lymphoma. He completed 4 cycles of R-ICE with cycle #1 beginning on 09/19/2010 and cycle #4 on 11/21/2010. Repeat head and neck examination by Dr. Constance Holster following R-ICE/rituximab showed no residual lymphoma. He began radiation consolidation on 12/18/2010, radiation was completed on 01/22/2011. 4. History of neutropenia secondary to rituximab. 5. Anemia secondary to chemotherapy, status post a red blood cell transfusion 11/30/2010. The hemoglobin has normalized. 6. History of Mild thrombocytopenia secondary to chemotherapy. 7. Hypertension. 8. 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 Port-A-Cath placement 03/21/2016   Disposition: Mr. Yonke appears improved. He completed cycle 1 EPOCH beginning 03/23/2016. The peripheral adenopathy and fevers/sweats have improved considerably.  He is neutropenic and  thrombocytopenic on labs today. He  understands to contact the office with fever, chills, other signs of infection, bruising/bleeding. He will return for a repeat CBC on 04/06/2016.  He is experiencing nausea, question delayed nausea. He will complete the 2 day course of dexamethasone as previously prescribed. He will contact the office if he has persistent nausea despite dexamethasone.  For the difficulty sleeping he will try Ativan 0.5 mg at bedtime as needed. This was effective when he was in the hospital.  He will return for a follow-up visit on 04/13/2016 with tentative plans for admission the same day to proceed with cycle 2 EPOCH. He will contact the office in the interim as outlined above or with any other problems.  Patient seen with Dr. [name]. 25 minutes were spent face-to-face at today's visit with the majority of that time involved in counseling/coordination of care.    [name] ANP/GNP-BC   04/03/2016  2:56 PM  This was a shared visit with [name]. Mr. Matsumura appears to have responded to the first cycle of EPOCH. He will return for a nadir CBC on 04/06/2016. We added Decadron for delayed nausea. He will be admitted for cycle 2 on 04/13/2016. We discussed the treatment plan with Mr. Melina Modena and his wife. Julieanne Manson, M.D.

## 2016-04-03 NOTE — Telephone Encounter (Signed)
Appointments scheduled per 04/03/16 los. Patient was given a copy of the AVS report and appointment schedule per 04/03/16 los. °

## 2016-04-06 ENCOUNTER — Telehealth: Payer: Self-pay

## 2016-04-06 ENCOUNTER — Other Ambulatory Visit (HOSPITAL_BASED_OUTPATIENT_CLINIC_OR_DEPARTMENT_OTHER): Payer: 59

## 2016-04-06 DIAGNOSIS — C8590 Non-Hodgkin lymphoma, unspecified, unspecified site: Secondary | ICD-10-CM | POA: Diagnosis not present

## 2016-04-06 LAB — MANUAL DIFFERENTIAL
ALC: 1.1 10*3/uL (ref 0.9–3.3)
ANC (CHCC MAN DIFF): 27.1 10*3/uL — AB (ref 1.5–6.5)
BLASTS: 0 % (ref 0–0)
Band Neutrophils: 17 % — ABNORMAL HIGH (ref 0–10)
Basophil: 0 % (ref 0–2)
EOS: 0 % (ref 0–7)
LYMPH: 3 % — ABNORMAL LOW (ref 14–49)
MONO: 20 % — AB (ref 0–14)
Metamyelocytes: 4 % — ABNORMAL HIGH (ref 0–0)
Myelocytes: 1 % — ABNORMAL HIGH (ref 0–0)
Other Cell: 0 % (ref 0–0)
PLT EST: DECREASED
PROMYELO: 0 % (ref 0–0)
SEG: 55 % (ref 38–77)
Variant Lymph: 0 % (ref 0–0)
nRBC: 0 % (ref 0–0)

## 2016-04-06 LAB — CBC WITH DIFFERENTIAL/PLATELET
HCT: 35.6 % — ABNORMAL LOW (ref 38.4–49.9)
HEMOGLOBIN: 12.5 g/dL — AB (ref 13.0–17.1)
MCH: 29.6 pg (ref 27.2–33.4)
MCHC: 35.1 g/dL (ref 32.0–36.0)
MCV: 84.2 fL (ref 79.3–98.0)
Platelets: 94 10*3/uL — ABNORMAL LOW (ref 140–400)
RBC: 4.23 10*6/uL (ref 4.20–5.82)
RDW: 14 % (ref 11.0–14.6)
WBC: 35.2 10*3/uL — ABNORMAL HIGH (ref 4.0–10.3)

## 2016-04-06 NOTE — Telephone Encounter (Signed)
-----   Message from [name], NP sent at 04/06/2016 11:01 AM EDT ----- Please let him know the white count is higher (now elevated) and the platelet count is stable. Follow-up as scheduled.

## 2016-04-06 NOTE — Telephone Encounter (Signed)
Called and informed pt of WBC and platelet counts and to follow up as scheduled. Spoke with patient and patients wife who verbalized understanding and denies any questions or concerns at this time.

## 2016-04-09 ENCOUNTER — Encounter (HOSPITAL_COMMUNITY): Payer: Self-pay

## 2016-04-10 ENCOUNTER — Telehealth: Payer: Self-pay | Admitting: *Deleted

## 2016-04-10 ENCOUNTER — Ambulatory Visit (HOSPITAL_BASED_OUTPATIENT_CLINIC_OR_DEPARTMENT_OTHER): Payer: 59 | Admitting: Nurse Practitioner

## 2016-04-10 VITALS — BP 126/85 | HR 90 | Temp 98.6°F | Resp 18 | Wt 190.7 lb

## 2016-04-10 DIAGNOSIS — C866 Primary cutaneous CD30-positive T-cell proliferations: Secondary | ICD-10-CM | POA: Diagnosis not present

## 2016-04-10 DIAGNOSIS — C8331 Diffuse large B-cell lymphoma, lymph nodes of head, face, and neck: Secondary | ICD-10-CM

## 2016-04-10 DIAGNOSIS — K1231 Oral mucositis (ulcerative) due to antineoplastic therapy: Secondary | ICD-10-CM

## 2016-04-10 NOTE — Telephone Encounter (Signed)
Call placed back to patient after receiving message from patient regarding sore throat that has gotten worse over night.  Patient denies any fever, thrush, mouth sores or difficulty swallowing.  Dr. [name] notified and would like for patient to come in this afternoon to see L. Marcello Moores NP.  Message left on patient's private cell phone to come in this afternoon at 2:15PM to see L. Marcello Moores NP.  Instructed patient to call [place] back to confirm message received.

## 2016-04-10 NOTE — Progress Notes (Addendum)
[name]   Diagnosis:  T-cell lymphoma  INTERVAL HISTORY:   [name] returns prior to scheduled follow-up for evaluation of a sore throat. He noted onset of a left-sided sore throat yesterday. He also notes tender left cervical and left groin lymph nodes. No fevers or sweats. Adenopathy continues to be improved. He has a good appetite. Nausea is better. In retrospect he thinks he may have had "heartburn" rather than nausea. He is fatigued.  Objective:  Vital signs in last 24 hours:  Blood pressure 126/85, pulse 90, temperature 98.6 F (37 C), temperature source Oral, resp. rate 18, weight 190 lb 11.2 oz (86.5 kg), SpO2 100 %.    HEENT: No thrush. Palate with patchy erythema left side greater than right. Lymphatics: No palpable cervical, supraclavicular, right axillary lymph nodes. One to 2 cm left axillary lymph node. Small bilateral inguinal/femoral nodes. Resp: Lungs clear bilaterally. Cardio: Regular rate and rhythm. GI: Abdomen soft and nontender. No hepatomegaly. Vascular: No leg edema. Port-A-Cath without erythema.   Lab Results:  Lab Results  Component Value Date   WBC 35.2 (H) 04/06/2016   HGB 12.5 (L) 04/06/2016   HCT 35.6 (L) 04/06/2016   MCV 84.2 04/06/2016   PLT 94 (L) 04/06/2016   NEUTROABS 0.5 (LL) 04/03/2016    Imaging:  No results found.  Medications: I have reviewed the patient's current medications.  Assessment/Plan: 1. 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  PET scan 03/20/2016 with extensive bulky hypermetabolic  nodal activity in the neck, chest, abdomen and pelvis; mild splenomegaly with diffusely mildly accentuated splenic activity  Staging bone marrow biopsy 03/23/2016-negative for involvement with lymphoma  Cycle 1 EPOCH beginning 03/23/2016 2. 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 He entered clinical remission following chemotherapy and remained in remission when he was seen at the cancer center 02/24/2009. 3. 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 Staging PET scan 08/23/2010 with increased FDG activity at the left tonsillar fossa and no additional evidence of lymphoma. He completed 4 cycles of R-ICE with cycle #1 beginning on 09/19/2010 and cycle #4 on 11/21/2010. Repeat head and neck examination by Dr. Constance Holster following R-ICE/rituximab showed no residual lymphoma. He began radiation consolidation on 12/18/2010, radiation was completed on 01/22/2011. 4. History of neutropenia secondary to rituximab. 5. Anemia secondary to chemotherapy, status post a red blood cell transfusion 11/30/2010. The hemoglobin has normalized. 6. History of mild thrombocytopenia secondary to chemotherapy. 7. Hypertension. 8. Left thigh mass-status post surgical excision [facility] November 2014 confirming a schwannoma 9. Port-A-Cath placement 03/21/2016   Disposition: [name] appears stable. The peripheral adenopathy continues to be improved. The sore throat is likely due to mucositis related to the chemotherapy. He will try salt water rinses.   He will return as scheduled 04/13/2016. He will contact the office in the interim with further problems.  Patient seen with Dr. [name].    [name] ANP/GNP-BC   04/10/2016  2:33 PM This was a shared visit with  [name]. [name] was  interviewed and examined. [name] appears to have mild mucositis at the palate and pharynx. He will try Chloraseptic spray and salt water rinses. He will return as scheduled for cycle 2 chemotherapy on 04/13/2016.  Julieanne Manson, M.D.

## 2016-04-11 ENCOUNTER — Telehealth: Payer: Self-pay | Admitting: Oncology

## 2016-04-11 NOTE — Telephone Encounter (Signed)
Pt appt. With Dr. Lanier Ensign is 04/19/16@1 :35. Pt is aware.

## 2016-04-13 ENCOUNTER — Inpatient Hospital Stay (HOSPITAL_COMMUNITY)
Admission: RE | Admit: 2016-04-13 | Discharge: 2016-04-17 | DRG: 847 | Disposition: A | Discharge status: Home / Self Care | Payer: 59 | Source: Ambulatory Visit | Attending: Oncology | Admitting: Oncology

## 2016-04-13 ENCOUNTER — Ambulatory Visit: Payer: 59

## 2016-04-13 ENCOUNTER — Other Ambulatory Visit (HOSPITAL_BASED_OUTPATIENT_CLINIC_OR_DEPARTMENT_OTHER): Payer: 59

## 2016-04-13 ENCOUNTER — Ambulatory Visit (HOSPITAL_COMMUNITY)
Admission: RE | Admit: 2016-04-13 | Discharge: 2016-04-13 | Disposition: A | Discharge status: Home / Self Care | Payer: 59 | Source: Ambulatory Visit | Attending: Oncology | Admitting: Oncology

## 2016-04-13 ENCOUNTER — Other Ambulatory Visit: Payer: Self-pay | Admitting: Oncology

## 2016-04-13 ENCOUNTER — Ambulatory Visit (HOSPITAL_BASED_OUTPATIENT_CLINIC_OR_DEPARTMENT_OTHER): Payer: Self-pay | Admitting: Nurse Practitioner

## 2016-04-13 VITALS — BP 117/74 | HR 65 | Temp 99.2°F | Resp 16 | Wt 190.0 lb

## 2016-04-13 DIAGNOSIS — E790 Hyperuricemia without signs of inflammatory arthritis and tophaceous disease: Secondary | ICD-10-CM

## 2016-04-13 DIAGNOSIS — C8590 Non-Hodgkin lymphoma, unspecified, unspecified site: Secondary | ICD-10-CM | POA: Diagnosis present

## 2016-04-13 DIAGNOSIS — Z5111 Encounter for antineoplastic chemotherapy: Secondary | ICD-10-CM | POA: Diagnosis present

## 2016-04-13 DIAGNOSIS — Z5112 Encounter for antineoplastic immunotherapy: Secondary | ICD-10-CM

## 2016-04-13 DIAGNOSIS — I10 Essential (primary) hypertension: Secondary | ICD-10-CM | POA: Diagnosis present

## 2016-04-13 DIAGNOSIS — G609 Hereditary and idiopathic neuropathy, unspecified: Secondary | ICD-10-CM | POA: Diagnosis not present

## 2016-04-13 DIAGNOSIS — Z79899 Other long term (current) drug therapy: Secondary | ICD-10-CM

## 2016-04-13 DIAGNOSIS — G620 Drug-induced polyneuropathy: Secondary | ICD-10-CM | POA: Diagnosis not present

## 2016-04-13 DIAGNOSIS — R110 Nausea: Secondary | ICD-10-CM | POA: Diagnosis not present

## 2016-04-13 DIAGNOSIS — I82819 Embolism and thrombosis of superficial veins of unspecified lower extremities: Secondary | ICD-10-CM

## 2016-04-13 DIAGNOSIS — Z880 Allergy status to penicillin: Secondary | ICD-10-CM

## 2016-04-13 DIAGNOSIS — M79605 Pain in left leg: Secondary | ICD-10-CM | POA: Diagnosis not present

## 2016-04-13 DIAGNOSIS — Z923 Personal history of irradiation: Secondary | ICD-10-CM | POA: Diagnosis not present

## 2016-04-13 DIAGNOSIS — R200 Anesthesia of skin: Secondary | ICD-10-CM | POA: Diagnosis not present

## 2016-04-13 DIAGNOSIS — C8440 Peripheral T-cell lymphoma, not classified, unspecified site: Secondary | ICD-10-CM

## 2016-04-13 DIAGNOSIS — I82812 Embolism and thrombosis of superficial veins of left lower extremities: Secondary | ICD-10-CM | POA: Diagnosis not present

## 2016-04-13 DIAGNOSIS — T451X5A Adverse effect of antineoplastic and immunosuppressive drugs, initial encounter: Secondary | ICD-10-CM | POA: Diagnosis not present

## 2016-04-13 DIAGNOSIS — C8441 Peripheral T-cell lymphoma, not classified, lymph nodes of head, face, and neck: Secondary | ICD-10-CM | POA: Diagnosis present

## 2016-04-13 DIAGNOSIS — Z8572 Personal history of non-Hodgkin lymphomas: Secondary | ICD-10-CM | POA: Diagnosis not present

## 2016-04-13 LAB — CBC WITH DIFFERENTIAL/PLATELET
BASO%: 0.4 % (ref 0.0–2.0)
Basophils Absolute: 0 10*3/uL (ref 0.0–0.1)
EOS ABS: 0 10*3/uL (ref 0.0–0.5)
EOS%: 0.1 % (ref 0.0–7.0)
HCT: 35.4 % — ABNORMAL LOW (ref 38.4–49.9)
HEMOGLOBIN: 12 g/dL — AB (ref 13.0–17.1)
LYMPH#: 0.6 10*3/uL — AB (ref 0.9–3.3)
LYMPH%: 4.8 % — ABNORMAL LOW (ref 14.0–49.0)
MCH: 28.9 pg (ref 27.2–33.4)
MCHC: 34 g/dL (ref 32.0–36.0)
MCV: 84.9 fL (ref 79.3–98.0)
MONO#: 1.2 10*3/uL — AB (ref 0.1–0.9)
MONO%: 10.8 % (ref 0.0–14.0)
NEUT%: 83.9 % — ABNORMAL HIGH (ref 39.0–75.0)
NEUTROS ABS: 9.7 10*3/uL — AB (ref 1.5–6.5)
PLATELETS: 160 10*3/uL (ref 140–400)
RBC: 4.16 10*6/uL — AB (ref 4.20–5.82)
RDW: 14 % (ref 11.0–14.6)
WBC: 11.5 10*3/uL — AB (ref 4.0–10.3)

## 2016-04-13 LAB — COMPREHENSIVE METABOLIC PANEL
ALBUMIN: 3.4 g/dL — AB (ref 3.5–5.0)
ALK PHOS: 48 U/L (ref 40–150)
ALT: 33 U/L (ref 0–55)
ANION GAP: 7 meq/L (ref 3–11)
AST: 19 U/L (ref 5–34)
BILIRUBIN TOTAL: 0.63 mg/dL (ref 0.20–1.20)
BUN: 10.9 mg/dL (ref 7.0–26.0)
CO2: 27 mEq/L (ref 22–29)
CREATININE: 0.8 mg/dL (ref 0.7–1.3)
Calcium: 9 mg/dL (ref 8.4–10.4)
Chloride: 105 mEq/L (ref 98–109)
EGFR: >90 mL/min/{1.73_m2} (ref >90)
GLUCOSE: 116 mg/dL (ref 70–140)
Potassium: 3.9 mEq/L (ref 3.5–5.1)
Sodium: 139 mEq/L (ref 136–145)
TOTAL PROTEIN: 5.9 g/dL — AB (ref 6.4–8.3)

## 2016-04-13 LAB — LACTATE DEHYDROGENASE: LDH: 293 U/L — ABNORMAL HIGH (ref 125–245)

## 2016-04-13 LAB — URIC ACID: URIC ACID, SERUM: 7.9 mg/dL — AB (ref 2.6–7.4)

## 2016-04-13 MED ORDER — HEPARIN SOD (PORK) LOCK FLUSH 100 UNIT/ML IV SOLN: 250.0000 [IU] | Freq: Once | INTRAVENOUS | Status: DC | PRN

## 2016-04-13 MED ORDER — SENNOSIDES-DOCUSATE SODIUM 8.6-50 MG PO TABS: 1.0000 | ORAL_TABLET | Freq: Every evening | ORAL | Status: DC | PRN

## 2016-04-13 MED ORDER — SODIUM CHLORIDE 0.9% FLUSH: 3.0000 mL | INTRAVENOUS | Status: DC | PRN

## 2016-04-13 MED ORDER — HOT PACK MISC ONCOLOGY
1.0000 | Freq: Once | Status: AC | PRN
  Filled 2016-04-13: qty 1

## 2016-04-13 MED ORDER — SODIUM CHLORIDE 0.9 % IV SOLN
INTRAVENOUS | Status: DC
  Administered 2016-04-13 – 2016-04-15 (×5): via INTRAVENOUS

## 2016-04-13 MED ORDER — VINCRISTINE SULFATE CHEMO INJECTION 1 MG/ML
Freq: Once | INTRAVENOUS | Status: AC
  Administered 2016-04-13: 14:00:00 via INTRAVENOUS
  Filled 2016-04-13: qty 10

## 2016-04-13 MED ORDER — SODIUM CHLORIDE 0.9 % IV SOLN
Freq: Once | INTRAVENOUS | Status: AC
  Administered 2016-04-13: 12:00:00 via INTRAVENOUS

## 2016-04-13 MED ORDER — PROMETHAZINE HCL 25 MG PO TABS: 12.5000 mg | ORAL_TABLET | Freq: Four times a day (QID) | ORAL | Status: DC | PRN

## 2016-04-13 MED ORDER — ATENOLOL 50 MG PO TABS
50.0000 mg | ORAL_TABLET | Freq: Every day | ORAL | Status: DC
  Administered 2016-04-14 – 2016-04-17 (×4): 50 mg via ORAL
  Filled 2016-04-13 (×4): qty 1

## 2016-04-13 MED ORDER — ALTEPLASE 2 MG IJ SOLR: 2.0000 mg | Freq: Once | INTRAMUSCULAR | Status: DC | PRN

## 2016-04-13 MED ORDER — COLD PACK MISC ONCOLOGY
1.0000 | Freq: Once | Status: AC | PRN
  Filled 2016-04-13: qty 1

## 2016-04-13 MED ORDER — ACETAMINOPHEN 500 MG PO TABS: 1000.0000 mg | ORAL_TABLET | Freq: Four times a day (QID) | ORAL | Status: DC | PRN

## 2016-04-13 MED ORDER — HEPARIN SOD (PORK) LOCK FLUSH 100 UNIT/ML IV SOLN
500.0000 [IU] | Freq: Once | INTRAVENOUS | Status: AC | PRN
  Administered 2016-04-17: 500 [IU]
  Filled 2016-04-13: qty 5

## 2016-04-13 MED ORDER — SODIUM CHLORIDE 0.9 % IV SOLN
10.0000 mg | Freq: Once | INTRAVENOUS | Status: AC
  Administered 2016-04-13: 10 mg via INTRAVENOUS
  Filled 2016-04-13: qty 1

## 2016-04-13 MED ORDER — LORAZEPAM 0.5 MG PO TABS
0.5000 mg | ORAL_TABLET | Freq: Every evening | ORAL | Status: DC | PRN
  Administered 2016-04-13 – 2016-04-16 (×4): 0.5 mg via ORAL
  Filled 2016-04-13 (×4): qty 1

## 2016-04-13 MED ORDER — SODIUM CHLORIDE 0.9% FLUSH: 10.0000 mL | INTRAVENOUS | Status: DC | PRN

## 2016-04-13 MED ORDER — ENOXAPARIN SODIUM 100 MG/ML ~~LOC~~ SOLN
1.0000 mg/kg | Freq: Two times a day (BID) | SUBCUTANEOUS | Status: DC
  Administered 2016-04-13 – 2016-04-17 (×9): 85 mg via SUBCUTANEOUS
  Filled 2016-04-13 (×9): qty 1

## 2016-04-13 MED ORDER — PALONOSETRON HCL INJECTION 0.25 MG/5ML
0.2500 mg | Freq: Once | INTRAVENOUS | Status: AC
  Administered 2016-04-13: 0.25 mg via INTRAVENOUS
  Filled 2016-04-13: qty 5

## 2016-04-13 MED ORDER — ALLOPURINOL 300 MG PO TABS
300.0000 mg | ORAL_TABLET | Freq: Every day | ORAL | Status: DC
  Administered 2016-04-13 – 2016-04-17 (×5): 300 mg via ORAL
  Filled 2016-04-13 (×5): qty 1

## 2016-04-13 NOTE — H&P (Signed)
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 He recently developed diffuse palpable lymphadenopathy and sweats. Biopsy of right cervical adenopathy on 03/14/2016 showed T-cell lymphoma, CD30 positive, ALK negative. Staging PET scan 03/20/2016 showed extensive bulky hypermetabolic nodal activity in the neck, chest, abdomen and pelvis; mild splenomegaly. He completed cycle 1 EPOCH beginning 03/23/2016. He had subsequent marked improvement in the palpable lymphadenopathy. The sweats resolved. He presents to the office today prior to admission to proceed with cycle 2 EPOCH.  He overall is feeling well. No fevers or sweats. He has a good appetite. Energy level is better. The sore throat and left sided neck discomfort he was experiencing earlier in the week have improved. He notes increased pain involving a left thigh "lymph node".  On exam today he was noted to have erythema along the right inner thigh. We referred him for a venous Doppler which showed a superficial thrombosis involving the greater saphenous vein.   Past Medical History:  Diagnosis Date  . History of radiation therapy 12/18/10 to 01/22/11   left oropharynx  . Hypertension   . Neutropenia    Secondary to Rituxan  . Non Hodgkin's lymphoma ([place])    s/p 4 cycles R-ICE, start 11/21/10  . T-cell lymphoma ([place]) 03/14/2016    Past Surgical History:  Procedure Laterality Date  . BIOPSY PHARYNX     09/20/10 excision right posterior pharynx  . BONE MARROW ASPIRATION      09/08/10 Biopsy , and clot, left iliac crest  . IR GENERIC HISTORICAL  03/21/2016   IR FLUORO GUIDE PORT INSERTION RIGHT 03/21/2016 Arne Cleveland, MD [facility]  . IR GENERIC HISTORICAL  03/21/2016    IR US GUIDE VASC ACCESS RIGHT 03/21/2016 Arne Cleveland, MD [facility]  . PORTACATH PLACEMENT  09/19/10  . TONSILLECTOMY     Bilateral    Medications Prior to Admission  Medication Sig Dispense Refill  . acetaminophen (TYLENOL) 500 MG tablet Take 1,000 mg by mouth every 6 (six) hours as needed for mild pain, moderate pain, fever or headache.    Marland Kitchen atenolol (TENORMIN) 50 MG tablet Take 50 mg by mouth daily.    . B Complex-C (B-COMPLEX WITH VITAMIN C) tablet Take 1 tablet by mouth daily.    Marland Kitchen lidocaine-prilocaine (EMLA) cream Apply 1 application topically as needed. Apply one hour prior to port access and cover with plastic wrap 30 g 1  . LORazepam (ATIVAN) 0.5 MG tablet Take 1 tablet (0.5 mg total) by mouth at bedtime as needed for anxiety. 30 tablet 0  . naproxen sodium (ANAPROX) 220 MG tablet Take 220 mg by mouth every 12 (twelve) hours as needed (for pain/fever).    . ondansetron (ZOFRAN) 8 MG tablet Take 1 tablet (8 mg total) by mouth every 6 (six) hours as needed for nausea. 20 tablet 2  . prochlorperazine (COMPAZINE) 10 MG tablet Take 1 tablet (10 mg total) by mouth every 6 (six) hours as needed for nausea or vomiting. 30 tablet 0   Scheduled Meds: . sodium chloride   Intravenous Once  . allopurinol  300 mg Oral Daily  . [START ON 04/14/2016] atenolol  50 mg Oral Daily  . dexamethasone (DECADRON) IVPB [facility]  10 mg Intravenous Once  . DOXOrubicin/vinCRIStine/etoposide CHEMO  IV infusion for OP CI   Intravenous Once  . enoxaparin (LOVENOX) injection  1 mg/kg Subcutaneous Q12H  . palonosetron  0.25 mg Intravenous Once   Continuous Infusions: . sodium chloride 100 mL/hr at 04/13/16 1225   PRN Meds:.acetaminophen, alteplase, Cold Pack, heparin lock flush, heparin lock flush, Hot Pack, LORazepam, promethazine, senna-docusate, sodium chloride flush, sodium chloride flush  Allergies  Allergen Reactions  . Penicillins Other (See Comments)    Childhood allergy reaction unknown  Has patient  had a PCN reaction causing immediate rash, facial/tongue/throat swelling, SOB or lightheadedness with hypotension: Unknown Has patient had a PCN reaction causing severe rash involving mucus membranes or skin necrosis: Unknown Has patient had a PCN reaction that required hospitalization: Unknown Has patient had a PCN reaction occurring within the last 10 years: Unknown If all of the above answers are "NO", then may proceed with Cephalosporin use.     Family history: Noncontributory. No family history of cancer.  Social history: He lives in [place]. He is married. No tobacco use.  ROS: The fevers and sweats have resolved. He has a good appetite. Energy level is better. Sore throat and left neck pain have improved. He notes increased pain involving a left thigh "lymph node". No mouth sores. No shortness of breath. No chest pain. No leg swelling or calf pain. No numbness or tingling in his hands or feet. No nausea or vomiting. No dysphagia. No constipation or diarrhea.  Physical:  Blood pressure 112/77, pulse 63, temperature 97.7 F (36.5 C), temperature source Oral, resp. rate 16, height _0  (1.727 m), weight 182 lb 1.6 oz (82.6 kg), SpO2 99 %.  General: No acute distress. HEENT: PERRLA. Extraocular movements intact. Sclera anicteric. Oropharynx is without thrush. No ulceration. Lymphatics: Small left anterior cervical and left scalene/supraclavicular lymph nodes. Approximate 2 cm left axillary lymph node. Small bilateral inguinal nodes left side larger than right. Chest: Lungs clear bilaterally. Cardiovascular: Regular rate and rhythm. Abdomen: Abdomen soft and nontender. No organomegaly. Slight fullness/firmness at the left lower mid abdomen. Extremities: Trace bilateral pretibial edema. Neuro: Alert and oriented. Gait normal. Skin: Erythema left medial 5 with associated tenderness. At the upper portion of the erythema there is an area of firmness. Port-A-Cath without  erythema.  Labs:  Results for orders placed or performed in visit on 04/13/16 (from the past 48 hour(s))  CBC with Differential     Status: Abnormal   Collection Time: 04/13/16  9:03 AM  Result Value Ref Range   WBC 11.5 (H) 4.0 - 10.3 10e3/uL   NEUT# 9.7 (H) 1.5 - 6.5 10e3/uL   HGB 12.0 (L) 13.0 - 17.1 g/dL   HCT 35.4 (L) 38.4 - 49.9 %   Platelets 160 140 - 400 10e3/uL   MCV 84.9 79.3 - 98.0 fL   MCH 28.9 27.2 - 33.4 pg   MCHC 34.0 32.0 - 36.0 g/dL   RBC 4.16 (L) 4.20 - 5.82 10e6/uL   RDW 14.0 11.0 - 14.6 %   lymph# 0.6 (L) 0.9 - 3.3 10e3/uL   MONO# 1.2 (H) 0.1 - 0.9 10e3/uL   Eosinophils Absolute 0.0 0.0 - 0.5 10e3/uL   Basophils Absolute 0.0 0.0 - 0.1 10e3/uL   NEUT% 83.9 (H) 39.0 - 75.0 %   LYMPH% 4.8 (L) 14.0 - 49.0 %   MONO% 10.8 0.0 - 14.0 %   EOS% 0.1 0.0 - 7.0 %   BASO% 0.4 0.0 - 2.0 %  Comprehensive metabolic panel     Status: Abnormal  Collection Time: 04/13/16  9:03 AM  Result Value Ref Range   Sodium 139 136 - 145 mEq/L   Potassium 3.9 3.5 - 5.1 mEq/L   Chloride 105 98 - 109 mEq/L   CO2 27 22 - 29 mEq/L   Glucose 116 70 - 140 mg/dl    Comment: Glucose reference range is for nonfasting patients. Fasting glucose reference range is 70- 100.   BUN 10.9 7.0 - 26.0 mg/dL   Creatinine 0.8 0.7 - 1.3 mg/dL   Total Bilirubin 0.63 0.20 - 1.20 mg/dL   Alkaline Phosphatase 48 40 - 150 U/L   AST 19 5 - 34 U/L   ALT 33 0 - 55 U/L   Total Protein 5.9 (L) 6.4 - 8.3 g/dL   Albumin 3.4 (L) 3.5 - 5.0 g/dL   Calcium 9.0 8.4 - 10.4 mg/dL   Anion Gap 7 3 - 11 mEq/L   EGFR >90 >90 ml/min/1.73 m2    Comment: eGFR is calculated using the CKD-EPI Creatinine Equation (2009)  Lactate dehydrogenase (LDH)     Status: Abnormal   Collection Time: 04/13/16  9:03 AM  Result Value Ref Range   LDH 293 (H) 125 - 245 U/L  Uric acid     Status: Abnormal   Collection Time: 04/13/16  9:03 AM  Result Value Ref Range   Uric Acid, Serum 7.9 (H) 2.6 - 7.4 mg/dl   No results  found.  Assessment/Plan 1. 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  PET scan 03/20/2016 with extensive bulky hypermetabolic nodal activity in the neck, chest, abdomen and pelvis; mild splenomegaly with diffusely mildly accentuated splenic activity  Staging bone marrow biopsy 03/23/2016-negative for involvement with lymphoma  Cycle 1 EPOCH beginning 03/23/2016 2. 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 He entered clinical remission following chemotherapy and remained in remission when he was seen at the cancer center 02/24/2009. 3. 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 Staging PET scan 08/23/2010 with increased FDG activity at the left tonsillar fossa and no additional evidence of lymphoma. He completed 4 cycles of R-ICE with cycle #1 beginning on 09/19/2010 and cycle #4 on 11/21/2010. Repeat head and neck examination by Dr. Constance Holster following R-ICE/rituximab showed no residual lymphoma. He began radiation consolidation on 12/18/2010, radiation was completed on 01/22/2011. 4. History of neutropenia secondary to rituximab. 5. Anemia secondary to chemotherapy, status post a red blood cell transfusion 11/30/2010. The hemoglobin has normalized. 6. History of mild thrombocytopenia secondary to chemotherapy. 7. Hypertension. 8. 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 Port-A-Cath placement 03/21/2016 10. Superficial thrombus left greater saphenous vein 04/13/2016. Lovenox initiated.    Plan  [name] is a 56 year old man with recently diagnosed T-cell lymphoma. He has completed 1 cycle of EPOCH with marked improvement in peripheral adenopathy. Plan to proceed with cycle 2 today as scheduled.  The uric acid level was elevated on labs from earlier today. He will resume allopurinol 300 mg daily. Labs will be  repeated on 04/15/2016.  He has a new superficial thrombus involving the left greater saphenous vein identified on venous Doppler earlier today. Lovenox will be initiated during the hospitalization with plans to discharge home on Xarelto.  Patient seen with Dr. [name].  [name] ANP/GNP-BC 04/13/2016, 1:07 PM   [name] was interviewed and examined. He will be admitted for cycle 2 EPOCH. The uric acid is mildly elevated. He will receive intravenous hydration and resume allopurinol. We will check a chemistry panel on 04/15/2016.  He has a palpable thrombus in the left thigh and a Doppler revealed a left greater saphenous clot. We prescribed Lovenox anticoagulation.

## 2016-04-13 NOTE — Progress Notes (Signed)
[name] OFFICE PROGRESS NOTE   Diagnosis:  Non-Hodgkin's lymphoma  INTERVAL HISTORY:   Mr. Stillings returns as scheduled. He overall is feeling well. Sore throat is less as is the left-sided left neck discomfort he was having earlier this week. He reports increased pain involving a left thigh lymph node. No fever or sweats. Energy level is better. He has a good appetite. He denies nausea/vomiting. No mouth sores. Bowels moving regularly. No dysphagia. No numbness or tingling in his hands or feet. He denies shortness of breath and chest pain. No leg swelling or calf pain.  Objective:  Vital signs in last 24 hours:  Blood pressure 117/74, pulse 65, temperature 99.2 F (37.3 C), temperature source Oral, resp. rate 16, weight 190 lb (86.2 kg), SpO2 99 %.    HEENT: PERRLA. Extraocular movements intact. Sclera anicteric. Oropharynx is without thrush. Lymphatics: Small left anterior cervical lymph node. Small left scalene/supraclavicular lymph node. 2 cm left axillary lymph node. Small bilateral inguinal nodes left side larger than right.  Resp: Lungs clear bilaterally. Cardio: Regular rate and rhythm. GI: Abdomen soft and nontender. No organomegaly. Slight fullness/firmness at the left lower mid abdomen. Vascular: Trace bilateral pretibial edema. Neuro: Alert and oriented. Gait normal.  Skin: Erythema left medial thigh, associated tenderness. There is an area of firmness at the upper inner leg. Port-A-Cath without erythema.  Lab Results:  Lab Results  Component Value Date   WBC 11.5 (H) 04/13/2016   HGB 12.0 (L) 04/13/2016   HCT 35.4 (L) 04/13/2016   MCV 84.9 04/13/2016   PLT 160 04/13/2016   NEUTROABS 9.7 (H) 04/13/2016    Imaging:  No results found.  Medications: I have reviewed the patient's current medications.  Assessment/Plan: 1. 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  PET scan 03/20/2016 with extensive bulky hypermetabolic nodal activity in the neck, chest, abdomen and pelvis; mild splenomegaly with diffusely mildly accentuated splenic activity  Staging bone marrow biopsy 03/23/2016-negative for involvement with lymphoma  Cycle 1 EPOCH beginning 03/23/2016 2. 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 He entered clinical remission following chemotherapy and remained in remission when he was seen at the cancer center 02/24/2009. 3. 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 Staging PET scan 08/23/2010 with increased FDG activity at the left tonsillar fossa and no additional evidence of lymphoma. He completed 4 cycles of R-ICE with cycle #1 beginning on 09/19/2010 and cycle #4 on 11/21/2010. Repeat head and neck examination by Dr. Constance Holster following R-ICE/rituximab showed no residual lymphoma. He began radiation consolidation on 12/18/2010, radiation was completed on 01/22/2011. 4. History of neutropenia secondary to rituximab. 5. Anemia secondary to chemotherapy, status post a red blood cell transfusion 11/30/2010. The hemoglobin has normalized. 6. History of mild thrombocytopenia secondary to chemotherapy. 7. Hypertension. 8. Left thigh mass-status post surgical excision [facility] November 2014 confirming a schwannoma 9. Port-A-Cath placement 03/21/2016   Disposition: Mr. Huskins appears stable. He has completed 1 cycle of EPOCH.  Peripheral adenopathy is significantly improved.  He has developed erythema, pain/tenderness at the left medial thigh. We discussed possibilities to include a blood clot, cellulitis. We also discussed the possibility of progressive lymphoma but feel this is less likely given that he appears to be responding to the chemotherapy. We are referring him for a venous Doppler to evaluate for a deep vein thrombosis. If the Doppler is positive the plan is to initiate anticoagulation and proceed with cycle 2 EPOCH today as scheduled. If the Doppler is negative he will be treated for cellulitis and the admission delayed by one week.  The doppler shows a superficial thrombus involving the greater saphenous vein. The plan is to initiate anticoagulation and proceed with the admission for cycle 2 EPOCH as scheduled.  See admission history and physical.  Patient seen with Dr. [name].  [name] ANP/GNP-BC   04/13/2016  10:39 AM

## 2016-04-13 NOTE — Progress Notes (Signed)
Manual calculation of BSA dosing of doxorubicin,etoposide and vincristine completed. Verified by Laural Benes ,RN

## 2016-04-13 NOTE — Progress Notes (Signed)
*  PRELIMINARY RESULTS* Vascular Ultrasound Left lower extremity venous duplex has been completed.  Preliminary findings: Negative for DVT. Superficial thrombosis is noted in the left greater saphenous vein (proximal to mid thigh). Bilateral enlarged inguinal lymph nodes.  Called results to [facility].    Landry Mellow, RDMS, RVT  04/13/2016, 10:51 AM

## 2016-04-14 MED ORDER — HEPARIN SOD (PORK) LOCK FLUSH 100 UNIT/ML IV SOLN: 500.0000 [IU] | Freq: Once | INTRAVENOUS | Status: DC | PRN

## 2016-04-14 MED ORDER — DEXAMETHASONE SODIUM PHOSPHATE 100 MG/10ML IJ SOLN
10.0000 mg | Freq: Once | INTRAMUSCULAR | Status: AC
  Administered 2016-04-14: 10 mg via INTRAVENOUS
  Filled 2016-04-14: qty 1

## 2016-04-14 MED ORDER — ALTEPLASE 2 MG IJ SOLR
2.0000 mg | Freq: Once | INTRAMUSCULAR | Status: DC | PRN
  Filled 2016-04-14: qty 2

## 2016-04-14 MED ORDER — SODIUM CHLORIDE 0.9% FLUSH: 3.0000 mL | INTRAVENOUS | Status: DC | PRN

## 2016-04-14 MED ORDER — HOT PACK MISC ONCOLOGY
1.0000 | Freq: Once | Status: AC | PRN
  Filled 2016-04-14: qty 1

## 2016-04-14 MED ORDER — SODIUM CHLORIDE 0.9 % IV SOLN: Freq: Once | INTRAVENOUS | Status: DC

## 2016-04-14 MED ORDER — COLD PACK MISC ONCOLOGY
1.0000 | Freq: Once | Status: AC | PRN
  Filled 2016-04-14: qty 1

## 2016-04-14 MED ORDER — VINCRISTINE SULFATE CHEMO INJECTION 1 MG/ML
Freq: Once | INTRAVENOUS | Status: AC
  Administered 2016-04-14: 13:00:00 via INTRAVENOUS
  Filled 2016-04-14 (×2): qty 10

## 2016-04-14 MED ORDER — FAMOTIDINE 20 MG PO TABS
20.0000 mg | ORAL_TABLET | Freq: Every day | ORAL | Status: DC | PRN
  Administered 2016-04-15: 20 mg via ORAL
  Filled 2016-04-14: qty 1

## 2016-04-14 MED ORDER — HEPARIN SOD (PORK) LOCK FLUSH 100 UNIT/ML IV SOLN: 250.0000 [IU] | Freq: Once | INTRAVENOUS | Status: DC | PRN

## 2016-04-14 MED ORDER — SODIUM CHLORIDE 0.9% FLUSH
10.0000 mL | INTRAVENOUS | Status: DC | PRN
Start: 2016-04-14 — End: 2016-04-17

## 2016-04-14 NOTE — Progress Notes (Signed)
IP PROGRESS NOTE  Subjective:   He started chemotherapy yesterday afternoon. No nausea, no new complaint. The left thigh pain has improved.  Objective: Vital signs in last 24 hours: Blood pressure 105/73, pulse 66, temperature 97.9 F (36.6 C), temperature source Oral, resp. rate 16, height '5\' 8"'$  (1.727 m), weight 182 lb 1.6 oz (82.6 kg), SpO2 98 %.  Intake/Output from previous day: 03/23 0701 - 03/24 0700 In: 2972 [P.O.:700; I.V.:1830; IV Piggyback:442] Out: 2542 [Urine:2600]  Physical Exam:  HEENT: No thrush Lungs: Clear bilaterally Cardiac: Regular rate and rhythm, no gallop Abdomen: No hepatosplenomegaly, nontender Extremities: No leg edema, palpable cord at the left upper medial thigh with mild surrounding erythema, less tender   Portacath/PICC-without erythema  Lab Results:  Recent Labs  04/13/16 0903  WBC 11.5*  HGB 12.0*  HCT 35.4*  PLT 160    BMET  Recent Labs  04/13/16 0903  NA 139  K 3.9  CO2 27  GLUCOSE 116  BUN 10.9  CREATININE 0.8  CALCIUM 9.0    Studies/Results: No results found.  Medications: I have reviewed the patient's current medications.  Assessment/Plan: 1. 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  PET scan 03/20/2016 with extensive bulky hypermetabolic nodal activity in the neck, chest, abdomen and pelvis; mild splenomegaly with diffusely mildly accentuated splenic activity  Staging bone marrow biopsy 03/23/2016-negative for involvement with lymphoma  Cycle 1 EPOCH beginning 03/23/2016  Cycle 2 EPOCH 04/13/2016 2. Large B-cell  lymphoma involving the left tonsil and right posterior pharynx diagnosed in September 2005, status post 6 cycles of CHOP/rituximab therapy. He entered clinical remission following chemotherapy and remained in remission when he was seen at the cancer center 02/24/2009. 3. 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 Staging PET scan 08/23/2010 with increased FDG activity at the left tonsillar fossa and no additional evidence of lymphoma. He completed 4 cycles of R-ICE with cycle #1 beginning on 09/19/2010 and cycle #4 on 11/21/2010. Repeat head and neck examination by Dr. Constance Holster following R-ICE/rituximab showed no residual lymphoma. He began radiation consolidation on 12/18/2010, radiation was completed on 01/22/2011. 4. History of neutropenia secondary to rituximab. 5. Anemia secondary to chemotherapy, status post a red blood cell transfusion 11/30/2010. The hemoglobin has normalized. 6. History of mild thrombocytopenia secondary to chemotherapy. 7. Hypertension. 8. Left thigh mass-status post surgical excision [facility] November 2014 confirming a schwannoma 9. Port-A-Cath placement 03/21/2016 10. Superficial thrombus left greater saphenous vein 04/13/2016. Lovenox initiated.  He is tolerating the chemotherapy well. We will check a chemistry panel and uric acid on 04/14/2016. The pain and tenderness associated with the left thigh thrombus have improved. He will continue Lovenox. The plan is to proceed with day 2 of infusional chemotherapy today.   LOS: 1 day   [name], MD   04/14/2016, 7:40 AM

## 2016-04-15 DIAGNOSIS — Z5111 Encounter for antineoplastic chemotherapy: Principal | ICD-10-CM

## 2016-04-15 DIAGNOSIS — Z8572 Personal history of non-Hodgkin lymphomas: Secondary | ICD-10-CM

## 2016-04-15 DIAGNOSIS — I82812 Embolism and thrombosis of superficial veins of left lower extremities: Secondary | ICD-10-CM

## 2016-04-15 LAB — BASIC METABOLIC PANEL
Anion gap: 5 (ref 5–15)
BUN: 12 mg/dL (ref 6–20)
CO2: 27 mmol/L (ref 22–32)
Calcium: 8.8 mg/dL — ABNORMAL LOW (ref 8.9–10.3)
Chloride: 107 mmol/L (ref 101–111)
Creatinine, Ser: 0.78 mg/dL (ref 0.61–1.24)
GFR calc Af Amer: >60 mL/min (ref >60)
GFR calc non Af Amer: >60 mL/min (ref >60)
Glucose, Bld: 108 mg/dL — ABNORMAL HIGH (ref 65–99)
Potassium: 3.4 mmol/L — ABNORMAL LOW (ref 3.5–5.1)
SODIUM: 139 mmol/L (ref 135–145)

## 2016-04-15 LAB — LACTATE DEHYDROGENASE: LDH: 154 U/L (ref 98–192)

## 2016-04-15 LAB — URIC ACID: URIC ACID, SERUM: 4.2 mg/dL — AB (ref 4.4–7.6)

## 2016-04-15 MED ORDER — ALTEPLASE 2 MG IJ SOLR: 2.0000 mg | Freq: Once | INTRAMUSCULAR | Status: DC | PRN

## 2016-04-15 MED ORDER — COLD PACK MISC ONCOLOGY
1.0000 | Freq: Once | Status: AC | PRN
  Filled 2016-04-15: qty 1

## 2016-04-15 MED ORDER — PALONOSETRON HCL INJECTION 0.25 MG/5ML
0.2500 mg | Freq: Once | INTRAVENOUS | Status: AC
Start: 2016-04-15 — End: 2016-04-15
  Administered 2016-04-15: 0.25 mg via INTRAVENOUS
  Filled 2016-04-15: qty 5

## 2016-04-15 MED ORDER — VINCRISTINE SULFATE CHEMO INJECTION 1 MG/ML
Freq: Once | INTRAVENOUS | Status: AC
  Administered 2016-04-15: 12:00:00 via INTRAVENOUS
  Filled 2016-04-15 (×3): qty 10

## 2016-04-15 MED ORDER — HEPARIN SOD (PORK) LOCK FLUSH 100 UNIT/ML IV SOLN: 250.0000 [IU] | Freq: Once | INTRAVENOUS | Status: DC | PRN

## 2016-04-15 MED ORDER — SODIUM CHLORIDE 0.9% FLUSH: 3.0000 mL | INTRAVENOUS | Status: DC | PRN

## 2016-04-15 MED ORDER — HOT PACK MISC ONCOLOGY
1.0000 | Freq: Once | Status: AC | PRN
  Filled 2016-04-15: qty 1

## 2016-04-15 MED ORDER — SODIUM CHLORIDE 0.9 % IV SOLN
INTRAVENOUS | Status: DC
  Administered 2016-04-15 – 2016-04-16 (×3): via INTRAVENOUS
  Filled 2016-04-15 (×6): qty 1000

## 2016-04-15 MED ORDER — HEPARIN SOD (PORK) LOCK FLUSH 100 UNIT/ML IV SOLN: 500.0000 [IU] | Freq: Once | INTRAVENOUS | Status: DC | PRN

## 2016-04-15 MED ORDER — SODIUM CHLORIDE 0.9% FLUSH: 10.0000 mL | INTRAVENOUS | Status: DC | PRN

## 2016-04-15 MED ORDER — SODIUM CHLORIDE 0.9 % IV SOLN: Freq: Once | INTRAVENOUS | Status: DC

## 2016-04-15 MED ORDER — SODIUM CHLORIDE 0.9 % IV SOLN
10.0000 mg | Freq: Once | INTRAVENOUS | Status: AC
  Administered 2016-04-15: 10 mg via INTRAVENOUS
  Filled 2016-04-15: qty 1

## 2016-04-15 NOTE — Progress Notes (Signed)
[name] is doing well. So far, he's had no problems with chemotherapy. He's had no nausea or vomiting. He's had no change in bowel or bladder habits.  He does have a thrombus in the left saphenous vein. He is on Lovenox injections.  There's been no fever. He's had no bleeding.  His appetite is doing fairly well.  He's had no cough.  He's had no lab work done yet today. Yesterday, the labs looked pretty good. I do not see any lab work that looked suspicious.  On his physical exam, his vital signs are temperature of 98.2. Pulse 71. Low pressure 130/85. An exam shows no ocular or oral lesions. He has no adenopathy in the neck. There is no mucositis. Lungs are clear bilaterally. Cardiac exam regular rate and rhythm with no murmurs, rubs or bruits. Abdomen is soft. Has good bowel sounds. There is no fluid wave or there is no palpable liver or spleen tip. Extremities shows no clubbing, cyanosis or edema. Neurological exam shows no focal neurological deficit. Skin exam shows no rashes, ecchymoses or petechia.  [name] is a very nice 56 year old white male with T-cell non-Hodgkin's lymphoma. He has a past history of B cell non-Hodgkin's lymphoma.  He is on his second cycle of R-EPOCH. He is doing well.  He says he's going to go to various cancer centers in the country regarding this diagnosis and any further treatment options. Apparently this is a CD30 positive malignancy so Adcetris might be a consideration for some type of maintenance or consolidation therapy. I would think that a stem cell transplant might not be a bad idea for him given his age in good health.  The staff on [address] continue to monitor him closely and are doing a great job taking care of his needs.  Lattie Haw, MD  [place] [id number]

## 2016-04-16 DIAGNOSIS — G609 Hereditary and idiopathic neuropathy, unspecified: Secondary | ICD-10-CM

## 2016-04-16 MED ORDER — SODIUM CHLORIDE 0.9 % IV SOLN: Freq: Once | INTRAVENOUS | Status: DC

## 2016-04-16 MED ORDER — VINCRISTINE SULFATE CHEMO INJECTION 1 MG/ML
Freq: Once | INTRAVENOUS | Status: AC
  Administered 2016-04-16: 11:00:00 via INTRAVENOUS
  Filled 2016-04-16: qty 10

## 2016-04-16 MED ORDER — SODIUM CHLORIDE 0.9 % IV SOLN
10.0000 mg | Freq: Once | INTRAVENOUS | Status: AC
  Administered 2016-04-16: 10 mg via INTRAVENOUS
  Filled 2016-04-16: qty 1

## 2016-04-16 MED ORDER — HOT PACK MISC ONCOLOGY
1.0000 | Freq: Once | Status: AC | PRN
  Filled 2016-04-16: qty 1

## 2016-04-16 MED ORDER — COLD PACK MISC ONCOLOGY
1.0000 | Freq: Once | Status: AC | PRN
  Filled 2016-04-16: qty 1

## 2016-04-16 NOTE — Progress Notes (Signed)
IP PROGRESS NOTE  Subjective:   No nausea. The left thigh pain remains improved. He complains of mild numbness in the fingertips this morning. This does not interfere with activity.  Objective: Vital signs in last 24 hours: Blood pressure 132/88, pulse 68, temperature 97.8 F (36.6 C), temperature source Oral, resp. rate 13, height '5\' 8"'$  (1.727 m), weight 182 lb 1.6 oz (82.6 kg), SpO2 100 %.  Intake/Output from previous day: 03/25 0701 - 03/26 0700 In: 720 [P.O.:720] Out: 4350 [Urine:4350]  Physical Exam:   Lungs: Clear bilaterally Cardiac: Regular rate and rhythm, no gallop Abdomen: No hepatosplenomegaly, nontender Extremities: No leg edema, minimal tenderness at the left upper thigh, no erythema   Portacath/PICC-without erythema  Lab Results:  Recent Labs  04/13/16 0903  WBC 11.5*  HGB 12.0*  HCT 35.4*  PLT 160    BMET  Recent Labs  04/13/16 0903 04/15/16 1114  NA 139 139  K 3.9 3.4*  CL  --  107  CO2 27 27  GLUCOSE 116 108*  BUN 10.9 12  CREATININE 0.8 0.78  CALCIUM 9.0 8.8*    Studies/Results: No results found.  Medications: I have reviewed the patient's current medications.  Assessment/Plan: 1. 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  PET scan 03/20/2016 with extensive bulky hypermetabolic nodal activity in the neck, chest, abdomen and pelvis; mild splenomegaly with diffusely mildly accentuated splenic activity  Staging bone marrow biopsy 03/23/2016-negative for involvement with lymphoma  Cycle 1 EPOCH beginning 03/23/2016  Cycle 2 EPOCH  04/13/2016 2. 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 He entered clinical remission following chemotherapy and remained in remission when he was seen at the cancer center 02/24/2009. 3. 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 Staging PET scan 08/23/2010 with increased FDG activity at the left tonsillar fossa and no additional evidence of lymphoma. He completed 4 cycles of R-ICE with cycle #1 beginning on 09/19/2010 and cycle #4 on 11/21/2010. Repeat head and neck examination by Dr. Constance Holster following R-ICE/rituximab showed no residual lymphoma. He began radiation consolidation on 12/18/2010, radiation was completed on 01/22/2011. 4. History of neutropenia secondary to rituximab. 5. Anemia secondary to chemotherapy, status post a red blood cell transfusion 11/30/2010. The hemoglobin has normalized. 6. History of mild thrombocytopenia secondary to chemotherapy. 7. Hypertension. 8. 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 Port-A-Cath placement 03/21/2016 10. Superficial thrombus left greater saphenous vein 04/13/2016. Lovenox initiated.  Mr. Brem continues to tolerate the chemotherapy well. 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 I will reduce the vincristine dose with the last day of infusional chemotherapy. He is scheduled to complete this cycle of chemotherapy on 04/17/2016.  The plan is to switch from Lovenox to Xarelto at discharge.   LOS: 3 days   [name], MD   04/16/2016, 8:25 AM

## 2016-04-17 ENCOUNTER — Other Ambulatory Visit: Payer: Self-pay | Admitting: Nurse Practitioner

## 2016-04-17 DIAGNOSIS — G620 Drug-induced polyneuropathy: Secondary | ICD-10-CM

## 2016-04-17 DIAGNOSIS — C8590 Non-Hodgkin lymphoma, unspecified, unspecified site: Secondary | ICD-10-CM

## 2016-04-17 MED ORDER — HOT PACK MISC ONCOLOGY
1.0000 | Freq: Once | Status: DC | PRN
  Filled 2016-04-17: qty 1

## 2016-04-17 MED ORDER — COLD PACK MISC ONCOLOGY
1.0000 | Freq: Once | Status: DC | PRN
  Filled 2016-04-17: qty 1

## 2016-04-17 MED ORDER — SODIUM CHLORIDE 0.9 % IV SOLN
750.0000 mg/m2 | Freq: Once | INTRAVENOUS | Status: AC
  Administered 2016-04-17: 1560 mg via INTRAVENOUS
  Filled 2016-04-17: qty 78

## 2016-04-17 MED ORDER — ALLOPURINOL 300 MG PO TABS: 300.0000 mg | ORAL_TABLET | Freq: Every day | ORAL | 0 refills | Status: DC

## 2016-04-17 MED ORDER — PALONOSETRON HCL INJECTION 0.25 MG/5ML
0.2500 mg | Freq: Once | INTRAVENOUS | Status: AC
  Administered 2016-04-17: 0.25 mg via INTRAVENOUS
  Filled 2016-04-17: qty 5

## 2016-04-17 MED ORDER — FOSAPREPITANT DIMEGLUMINE INJECTION 150 MG
Freq: Once | INTRAVENOUS | Status: AC
  Administered 2016-04-17: 09:00:00 via INTRAVENOUS
  Filled 2016-04-17: qty 5

## 2016-04-17 MED ORDER — RIVAROXABAN 20 MG PO TABS: 20.0000 mg | ORAL_TABLET | Freq: Every day | ORAL | 3 refills | Status: DC

## 2016-04-17 NOTE — Progress Notes (Signed)
Manual calculation of Cytoxan completed.  Dosage and dilution verified with Lottie Dawson, RN.

## 2016-04-17 NOTE — Discharge Summary (Signed)
Physician Discharge Summary  Patient ID: [name] @ATTENDINGNPI @ MRN: [id number] DOB/AGE: 1960-06-25 55 y.o.  Admit date: 04/13/2016 Discharge date: 04/17/2016    Discharge Diagnoses:  Active Problems:   T-cell lymphoma ([place])   Discharged Condition: stable  Discharge Labs: none  Significant Diagnostic Studies: none  Consults: none  Procedures: Infusional chemotherapy  Disposition: 01-Home or Self Care   Allergies as of 04/17/2016      Reactions   Penicillins Other (See Comments)   Childhood allergy reaction unknown  Has patient had a PCN reaction causing immediate rash, facial/tongue/throat swelling, SOB or lightheadedness with hypotension: Unknown Has patient had a PCN reaction causing severe rash involving mucus membranes or skin necrosis: Unknown Has patient had a PCN reaction that required hospitalization: Unknown Has patient had a PCN reaction occurring within the last 10 years: Unknown If all of the above answers are "NO", then may proceed with Cephalosporin use.      Medication List    TAKE these medications   acetaminophen 500 MG tablet Commonly known as:  TYLENOL Take 1,000 mg by mouth every 6 (six) hours as needed for mild pain, moderate pain, fever or headache.   allopurinol 300 MG tablet Commonly known as:  ZYLOPRIM Take 1 tablet (300 mg total) by mouth daily.   atenolol 50 MG tablet Commonly known as:  TENORMIN Take 50 mg by mouth daily.   B-complex with vitamin C tablet Take 1 tablet by mouth daily.   lidocaine-prilocaine cream Commonly known as:  EMLA Apply 1 application topically as needed. Apply one hour prior to port access and cover with plastic wrap   LORazepam 0.5 MG tablet Commonly known as:  ATIVAN Take 1 tablet (0.5 mg total) by mouth at bedtime as needed for anxiety.   naproxen sodium 220 MG tablet Commonly known as:  ANAPROX Take 220 mg by mouth every 12 (twelve) hours as needed (for pain/fever).   ondansetron 8 MG  tablet Commonly known as:  ZOFRAN Take 1 tablet (8 mg total) by mouth every 6 (six) hours as needed for nausea.   prochlorperazine 10 MG tablet Commonly known as:  COMPAZINE Take 1 tablet (10 mg total) by mouth every 6 (six) hours as needed for nausea or vomiting.   rivaroxaban 20 MG Tabs tablet Commonly known as:  XARELTO Take 1 tablet (20 mg total) by mouth daily with supper.       Follow-up Information    [name], MD Follow up.   Specialty:  Oncology Why:  Return to [place] 04/18/2016 for Neulasta injection Lab visit at [facility] 04/23/2016 Office visit at [facility] 05/02/2016 Contact information: [place] [address] [phone]           Hospital Course: [name] was admitted electively on 04/13/2016 to proceed with cycle 2 EPOCH. He reported finger numbness on 04/16/2016. The vincristine dose was reduced with the last day of infusional chemotherapy. He otherwise tolerated the chemotherapy with no significant acute toxicity. He completed the chemotherapy on 04/17/2016 and was discharged to home.  Admission labs on 04/13/2016 showed the uric acid level to be elevated at 7.9. He received allopurinol throughout the hospitalization. Repeat uric acid level on 04/15/2016 was improved at 4.2. He will continue allopurinol for an additional 2 days as an outpatient.  On the day of admission he was found to have a superficial thrombosis involving the greater saphenous vein. He received Lovenox 1 mg/kg every 12 hours during the hospitalization. He will  be transitioned to Xarelto beginning 04/18/2016.  He will return to [facility] [place] for a Neulasta injection on 04/18/2016. He will return for a CBC on 04/23/2016. We are referring him for a PET scan on 04/30/2016 and a follow-up visit on 05/02/2016.  Addendum: [name] was interviewed and examined on the day of discharge. The cyclophosphamide infusion was lengthened to 60 minutes  secondary to nausea with cyclophosphamide during cycle 1. He had mild neuropathy in the fingers that was unchanged on the day of discharge. I dose reduce the vincristine with day 4 of infusional chemotherapy.  Julieanne Manson, M.D.   Discharge Instructions    SCHEDULING COMMUNICATION    Complete by:  As directed    Chemotherapy Appointment - 2 hr   STEROID NURSING COMMUNICATION    Complete by:  As directed    May order Prednisone 60mg /m2 po to be given in clinic if patient did not take at home.   TREATMENT CONDITIONS    Complete by:  As directed    Patient should have CBC & CMP within 7 days prior to chemotherapy administration. NOTIFY MD IF: ANC < 1500, Hemoglobin < 8, PLT < 100,000,  Total Bili > 1.5, Creatinine > 1.5, ALT & AST > 80 or if patient has unstable vital signs: Temperature > 38.5, SBP > 180 or < 90, RR > 30 or HR > 100.   TREATMENT CONDITIONS    Complete by:  As directed    Patient should have CBC & CMP within 7 days prior to chemotherapy administration. NOTIFY MD IF: ANC < 1500, Hemoglobin < 8, PLT < 100,000,  Total Bili > 1.5, Creatinine > 1.5, ALT & AST > 80 or if patient has unstable vital signs: Temperature > 38.5, SBP > 180 or < 90, RR > 30 or HR > 100.      Signed: [name] 04/17/2016, 4:09 PM

## 2016-04-17 NOTE — Discharge Instructions (Signed)
Call for bleeding or fever

## 2016-04-18 ENCOUNTER — Ambulatory Visit (HOSPITAL_BASED_OUTPATIENT_CLINIC_OR_DEPARTMENT_OTHER): Payer: 59

## 2016-04-18 ENCOUNTER — Other Ambulatory Visit: Payer: Self-pay | Admitting: *Deleted

## 2016-04-18 VITALS — BP 119/67 | HR 52 | Temp 98.3°F | Resp 16

## 2016-04-18 DIAGNOSIS — C8440 Peripheral T-cell lymphoma, not classified, unspecified site: Secondary | ICD-10-CM

## 2016-04-18 DIAGNOSIS — Z5189 Encounter for other specified aftercare: Secondary | ICD-10-CM

## 2016-04-18 DIAGNOSIS — Z95828 Presence of other vascular implants and grafts: Secondary | ICD-10-CM

## 2016-04-18 DIAGNOSIS — C8590 Non-Hodgkin lymphoma, unspecified, unspecified site: Secondary | ICD-10-CM

## 2016-04-18 MED ORDER — PEGFILGRASTIM INJECTION 6 MG/0.6ML ~~LOC~~
6.0000 mg | PREFILLED_SYRINGE | Freq: Once | SUBCUTANEOUS | Status: AC
  Administered 2016-04-18: 6 mg via SUBCUTANEOUS
  Filled 2016-04-18: qty 0.6

## 2016-04-23 ENCOUNTER — Other Ambulatory Visit: Payer: Self-pay | Admitting: *Deleted

## 2016-04-23 ENCOUNTER — Telehealth: Payer: Self-pay

## 2016-04-23 ENCOUNTER — Telehealth: Payer: Self-pay | Admitting: *Deleted

## 2016-04-23 ENCOUNTER — Other Ambulatory Visit (HOSPITAL_BASED_OUTPATIENT_CLINIC_OR_DEPARTMENT_OTHER): Payer: 59

## 2016-04-23 DIAGNOSIS — C8590 Non-Hodgkin lymphoma, unspecified, unspecified site: Secondary | ICD-10-CM

## 2016-04-23 LAB — CBC WITH DIFFERENTIAL/PLATELET
BASO%: 0.8 % (ref 0.0–2.0)
Basophils Absolute: 0 10*3/uL (ref 0.0–0.1)
EOS%: 4 % (ref 0.0–7.0)
Eosinophils Absolute: 0.1 10*3/uL (ref 0.0–0.5)
HEMATOCRIT: 31 % — AB (ref 38.4–49.9)
HGB: 10.8 g/dL — ABNORMAL LOW (ref 13.0–17.1)
LYMPH#: 0.5 10*3/uL — AB (ref 0.9–3.3)
LYMPH%: 36.8 % (ref 14.0–49.0)
MCH: 28.8 pg (ref 27.2–33.4)
MCHC: 34.8 g/dL (ref 32.0–36.0)
MCV: 82.7 fL (ref 79.3–98.0)
MONO#: 0.3 10*3/uL (ref 0.1–0.9)
MONO%: 21.6 % — ABNORMAL HIGH (ref 0.0–14.0)
NEUT%: 36.8 % — ABNORMAL LOW (ref 39.0–75.0)
NEUTROS ABS: 0.5 10*3/uL — AB (ref 1.5–6.5)
Platelets: 170 10*3/uL (ref 140–400)
RBC: 3.75 10*6/uL — ABNORMAL LOW (ref 4.20–5.82)
RDW: 14.3 % (ref 11.0–14.6)
WBC: 1.3 10*3/uL — AB (ref 4.0–10.3)
nRBC: 0 % (ref 0–0)

## 2016-04-23 LAB — COMPREHENSIVE METABOLIC PANEL
ALT: 28 U/L (ref 0–55)
AST: 12 U/L (ref 5–34)
Albumin: 3.6 g/dL (ref 3.5–5.0)
Alkaline Phosphatase: 35 U/L — ABNORMAL LOW (ref 40–150)
Anion Gap: 9 mEq/L (ref 3–11)
BUN: 14.4 mg/dL (ref 7.0–26.0)
CALCIUM: 9.4 mg/dL (ref 8.4–10.4)
CHLORIDE: 104 meq/L (ref 98–109)
CO2: 26 meq/L (ref 22–29)
CREATININE: 0.8 mg/dL (ref 0.7–1.3)
EGFR: >90 mL/min/{1.73_m2} (ref >90)
GLUCOSE: 112 mg/dL (ref 70–140)
Potassium: 4.1 mEq/L (ref 3.5–5.1)
Sodium: 138 mEq/L (ref 136–145)
Total Bilirubin: 0.67 mg/dL (ref 0.20–1.20)
Total Protein: 6.1 g/dL — ABNORMAL LOW (ref 6.4–8.3)

## 2016-04-23 MED ORDER — MAGIC MOUTHWASH: ORAL | 0 refills | Status: DC

## 2016-04-23 NOTE — Telephone Encounter (Signed)
Call placed back to patient's wife to check on patient's mouth.  She states that patient is eating and drinking without difficulty.  Pt.'s wife instructed per order of Dr. [name] to begin Magic Mouthwash which will be sent to [facility] per pt.'s wifes request.  Instructed wife to please notify us if MM is ineffective.  Patient's wife appreciative of call back and has no questions at this time. Patient's wife did state that they met with MD at [facility] last Thursday.  Will notify Dr. [name].

## 2016-04-23 NOTE — Telephone Encounter (Signed)
Wife called stating they will be at [facility] at 65 for lab work. [name] is having mouth ulcers. She can see red spot on back of throat and 1 raised red spot on side of tongue. These started Saturday. They have been managing with salt water rinses. She is asking for an RX to be sent to [facility]. If Dr [name] feels he needs to be seen they will be here at noon.

## 2016-04-23 NOTE — Telephone Encounter (Signed)
Call placed to patient to notify him that labs from today were reviewed by Dr. [name] and Dr. [name] would like for pt to return as scheduled next Monday, 04/30/16 for lab appt.  Patient appreciative of call and states that he has the MM at home now and has used it with "much relief" of his mouth sores.

## 2016-04-24 ENCOUNTER — Other Ambulatory Visit: Payer: Self-pay | Admitting: *Deleted

## 2016-04-24 ENCOUNTER — Telehealth: Payer: Self-pay | Admitting: *Deleted

## 2016-04-24 DIAGNOSIS — C8590 Non-Hodgkin lymphoma, unspecified, unspecified site: Secondary | ICD-10-CM

## 2016-04-24 MED ORDER — LORAZEPAM 0.5 MG PO TABS: ORAL_TABLET | ORAL | 0 refills | Status: DC

## 2016-04-24 NOTE — Telephone Encounter (Signed)
Call received from patient requesting refill of Ativan.  Patient states that he is taking 0.5-1mg  at bedtime as needed for sleep.  New prescription called to [facility] per order of Dr. [name].  Patient notified of prescription change.

## 2016-04-25 ENCOUNTER — Other Ambulatory Visit: Payer: Self-pay | Admitting: Medical Oncology

## 2016-04-26 ENCOUNTER — Telehealth: Payer: Self-pay | Admitting: *Deleted

## 2016-04-30 ENCOUNTER — Ambulatory Visit (HOSPITAL_BASED_OUTPATIENT_CLINIC_OR_DEPARTMENT_OTHER): Payer: 59

## 2016-04-30 ENCOUNTER — Telehealth: Payer: Self-pay | Admitting: *Deleted

## 2016-04-30 ENCOUNTER — Ambulatory Visit: Payer: Self-pay

## 2016-04-30 DIAGNOSIS — C8590 Non-Hodgkin lymphoma, unspecified, unspecified site: Secondary | ICD-10-CM

## 2016-04-30 LAB — COMPREHENSIVE METABOLIC PANEL
ALT: 26 U/L (ref 0–55)
AST: 18 U/L (ref 5–34)
Albumin: 3.8 g/dL (ref 3.5–5.0)
Alkaline Phosphatase: 49 U/L (ref 40–150)
Anion Gap: 12 mEq/L — ABNORMAL HIGH (ref 3–11)
BUN: 11.3 mg/dL (ref 7.0–26.0)
CHLORIDE: 104 meq/L (ref 98–109)
CO2: 24 meq/L (ref 22–29)
CREATININE: 0.9 mg/dL (ref 0.7–1.3)
Calcium: 9.1 mg/dL (ref 8.4–10.4)
EGFR: >90 mL/min/{1.73_m2} (ref >90)
GLUCOSE: 130 mg/dL (ref 70–140)
POTASSIUM: 4 meq/L (ref 3.5–5.1)
SODIUM: 140 meq/L (ref 136–145)
Total Bilirubin: 0.66 mg/dL (ref 0.20–1.20)
Total Protein: 6.3 g/dL — ABNORMAL LOW (ref 6.4–8.3)

## 2016-04-30 LAB — CBC WITH DIFFERENTIAL/PLATELET
BASO%: 0.6 % (ref 0.0–2.0)
Basophils Absolute: 0.1 10*3/uL (ref 0.0–0.1)
EOS%: 0.9 % (ref 0.0–7.0)
Eosinophils Absolute: 0.2 10*3/uL (ref 0.0–0.5)
HEMATOCRIT: 35.9 % — AB (ref 38.4–49.9)
HGB: 12 g/dL — ABNORMAL LOW (ref 13.0–17.1)
LYMPH#: 0.6 10*3/uL — AB (ref 0.9–3.3)
LYMPH%: 3.6 % — AB (ref 14.0–49.0)
MCH: 29.4 pg (ref 27.2–33.4)
MCHC: 33.4 g/dL (ref 32.0–36.0)
MCV: 88 fL (ref 79.3–98.0)
MONO#: 1.3 10*3/uL — ABNORMAL HIGH (ref 0.1–0.9)
MONO%: 7.5 % (ref 0.0–14.0)
NEUT#: 15.4 10*3/uL — ABNORMAL HIGH (ref 1.5–6.5)
NEUT%: 87.4 % — AB (ref 39.0–75.0)
Platelets: 118 10*3/uL — ABNORMAL LOW (ref 140–400)
RBC: 4.08 10*6/uL — AB (ref 4.20–5.82)
RDW: 17 % — ABNORMAL HIGH (ref 11.0–14.6)
WBC: 17.6 10*3/uL — ABNORMAL HIGH (ref 4.0–10.3)

## 2016-04-30 LAB — LACTATE DEHYDROGENASE: LDH: 427 U/L — ABNORMAL HIGH (ref 125–245)

## 2016-04-30 NOTE — Telephone Encounter (Signed)
Call from patient at 9:45 requesting to "reschedule today's lab appointment to 12:00 pm.  Return number [phone]."

## 2016-05-01 NOTE — Telephone Encounter (Signed)
Left message informing pt Dr. [name] can see him at 0830 if he prefers on 4/11. If not keep scheduled appt.

## 2016-05-02 ENCOUNTER — Ambulatory Visit (HOSPITAL_BASED_OUTPATIENT_CLINIC_OR_DEPARTMENT_OTHER): Payer: 59 | Admitting: Oncology

## 2016-05-02 VITALS — BP 132/75 | HR 59 | Temp 98.1°F | Resp 20 | Ht 68.0 in | Wt 191.3 lb

## 2016-05-02 DIAGNOSIS — C8590 Non-Hodgkin lymphoma, unspecified, unspecified site: Secondary | ICD-10-CM

## 2016-05-02 DIAGNOSIS — C866 Primary cutaneous CD30-positive T-cell proliferations: Secondary | ICD-10-CM | POA: Diagnosis not present

## 2016-05-02 NOTE — Progress Notes (Signed)
**Don Don** Don Don   Diagnosis:  T-cell lymphoma  INTERVAL HISTORY:    Don Don returns as scheduled. Don Don completed cycle 2 EPOCH beginning 04/13/2016. Don Don was discharged from the hospital 04/17/2016 and received Neulasta on 04/18/2016.  Don Don saw Dr. Lanier Ensign on 04/19/2017. Don Don recommends a third cycle of chemotherapy prior to a restaging PET. Don Don recommends pursuing an allogeneic stem cell transplant if a good response is documented. Don Don is scheduled for a consultation at [facility] next week.  Don Don had mild bone pain following Neulasta. Don Don reports ulcerations of the tongue. No fever or night sweats. No other complaint. The palpable lymph nodes have resolved. No bleeding. The discomfort at the left thigh has resolved.  Objective:  Vital signs in last 24 hours:  Blood pressure 132/75, pulse (!) 59, temperature 98.1 F (36.7 C), temperature source Oral, resp. rate 20, height '5\' 8"'$  (1.727 m), weight 191 lb 4.8 oz (86.8 kg), SpO2 100 %.    HEENT: Erythema at the posterior palate, healing ulcers at the left side of the tongue Lymphatics: No cervical, supraclavicular, or inguinal nodes. 1 cm mobile bilateral axillary node versus prominent fat pads Resp: Lungs clear bilaterally Cardio: Regular rate and rhythm GI: No hepatosplenomegaly, nontender, no mass Vascular: No leg edema, no palpable cord or erythema at the left thigh  Portacath/PICC-without erythema  Lab Results:  Lab Results  Component Value Date   WBC 17.6 (H) 04/30/2016   HGB 12.0 (L) 04/30/2016   HCT 35.9 (L) 04/30/2016   MCV 88.0 04/30/2016   PLT 118 (L) 04/30/2016   NEUTROABS 15.4 (H) 04/30/2016   LDH 427  Medications: I have reviewed the patient's current medications.  Assessment/Plan: 1. 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  PET scan 03/20/2016 with extensive bulky hypermetabolic nodal activity in the neck, chest, abdomen and pelvis; mild splenomegaly with diffusely mildly accentuated splenic activity  Staging bone marrow biopsy 03/23/2016-negative for involvement with lymphoma  Cycle 1 EPOCH beginning 03/23/2016  Cycle 2 EPOCH 04/13/2016 2. 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 Don Don entered clinical remission following chemotherapy and remained in remission when Don Don was seen at the cancer center 02/24/2009. 3. 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 Staging PET scan 08/23/2010 with increased FDG activity at the left tonsillar fossa and no additional evidence of lymphoma. Don Don completed 4 cycles of R-ICE with cycle #1 beginning on 09/19/2010 and cycle #4 on 11/21/2010. Repeat head and neck examination by Dr. Constance Holster following R-ICE/rituximab showed no residual lymphoma. Don Don began radiation consolidation on 12/18/2010, radiation was completed on 01/22/2011. 4. History of neutropenia secondary to rituximab. 5. Anemia secondary to chemotherapy, status post a red blood cell transfusion 11/30/2010. The hemoglobin has normalized. 6. History of mild thrombocytopenia secondary to chemotherapy. 7. Hypertension. 8. 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 Port-A-Cath placement 03/21/2016 10. Superficial thrombus left greater saphenous vein 04/13/2016. Lovenox initiated, converted to Xarelto beginning  04/18/2016  Disposition: [name] has completed 2 cycles of EPOCH. Don Don has tolerated the chemotherapy well aside from mucositis. Don Don has resolving mucositis at the tongue. There has been marked clinical improvement. Don Don will complete a third cycle of  EPOCH prior to a restaging evaluation.   Don Don will return for the next cycle of chemotherapy on 05/14/2016. We will consider substituting Doxil for doxorubicin based on the chemotherapy doxorubicin dose to date.  The mildly elevated LDH  earlier this week is nonspecific. There is no other evidence of progressive lymphoma.  Don Don is scheduled for consultation at [facility] next week and will be seen by the transplant service at [facility] in early May.  25 minutes were spent with the patient today. The majority of the time was used for counseling and coordination of care.   [name], MD  05/02/2016  11:52 AM

## 2016-05-04 ENCOUNTER — Telehealth: Payer: Self-pay | Admitting: Oncology

## 2016-05-04 NOTE — Telephone Encounter (Signed)
Patient bypassed scheduling on 05/02/16.  Called patient to confirm appointments. Left message on patient's voicemail regarding appointment details, per 05/02/16 los.

## 2016-05-07 ENCOUNTER — Telehealth: Payer: Self-pay | Admitting: Oncology

## 2016-05-07 NOTE — Telephone Encounter (Signed)
FAXED RECORDS TO [facility] [place] [id number]

## 2016-05-08 ENCOUNTER — Other Ambulatory Visit: Payer: Self-pay | Admitting: *Deleted

## 2016-05-14 ENCOUNTER — Telehealth: Payer: Self-pay | Admitting: Oncology

## 2016-05-14 ENCOUNTER — Ambulatory Visit: Payer: Self-pay | Admitting: Nurse Practitioner

## 2016-05-14 ENCOUNTER — Telehealth: Payer: Self-pay | Admitting: *Deleted

## 2016-05-14 ENCOUNTER — Other Ambulatory Visit: Payer: Self-pay

## 2016-05-14 NOTE — Telephone Encounter (Signed)
Message from pt reporting he is leaving [facility] [place] today. They haven't given a recommendation yet- they did not have CD or slides for visit. Pt is requesting those to be sent. Request to HIM. Pt was scheduled to be seen here today. Per Dr. [name]: Schedule office visit 4/26 @ 0800.

## 2016-05-14 NOTE — Telephone Encounter (Signed)
Spoke with pt and wife, he had PET done at [facility]. He did not get a copy. They requested assistance coordinating slides and CDs to get to [facility] for next week's visit.  Faxed request to [facility] [employer] requesting CD.

## 2016-05-14 NOTE — Telephone Encounter (Signed)
Faxed disability forms to [employer] at fax number [phone]. Scanned copy of completed forms in Epic

## 2016-05-14 NOTE — Telephone Encounter (Signed)
Received notice from [employer] that there was an error on disability forms, corrected and re-faxed on 05/11/16 to [phone], scanned copy of corrected forms into Epic

## 2016-05-14 NOTE — Telephone Encounter (Signed)
Spoke with  Alma Downs from  [facility] and the clinic has all the pt's records they need.

## 2016-05-17 ENCOUNTER — Other Ambulatory Visit: Payer: Self-pay | Admitting: Oncology

## 2016-05-17 ENCOUNTER — Ambulatory Visit (HOSPITAL_BASED_OUTPATIENT_CLINIC_OR_DEPARTMENT_OTHER): Payer: 59 | Admitting: Oncology

## 2016-05-17 VITALS — BP 131/74 | HR 67 | Temp 98.0°F | Resp 18 | Ht 68.0 in | Wt 196.0 lb

## 2016-05-17 DIAGNOSIS — C866 Primary cutaneous CD30-positive T-cell proliferations: Secondary | ICD-10-CM | POA: Diagnosis not present

## 2016-05-17 DIAGNOSIS — C8331 Diffuse large B-cell lymphoma, lymph nodes of head, face, and neck: Secondary | ICD-10-CM

## 2016-05-17 NOTE — Progress Notes (Signed)
[name] OFFICE PROGRESS NOTE   Diagnosis: Non-Hodgkin's lymphoma  INTERVAL HISTORY:   [name] returns after consultation at [name]. He was seen by the lymphoma and transplant service as they are. Dr.Fanale recommends another cycle of EPOCH prior to stem cell therapy. He underwent a restaging PET scan on 05/08/2016 that showed multiple small lymph nodes with moderate FDG activity in the neck, axilla, respiratory peritoneum, mesentery, pelvis, and groin. Multiple indeterminate lesions were noted in the liver with no significant FDG activity. A nodular mass was noted in the left thigh. An MRI and biopsy of the thigh lesion were recommended.   He saw cardiology at [name]. He was placed on telmisartan as a cardioprotective agent. An echocardiogram on 05/08/2016 revealed normal left ventricular size and function. The left ventricle ejection fraction was measured in the range of 62%.  He reports feeling well at present. No palpable lymph nodes. No fever or night sweats. Good appetite. No pain in the left thigh.  Objective:  Vital signs in last 24 hours:  Blood pressure 131/74, pulse 67, temperature 98 F (36.7 C), temperature source Oral, resp. rate 18, height '5\' 8"'$  (1.727 m), weight 196 lb (88.9 kg), SpO2 98 %.    HEENT: No thrush or ulcers, neck without mass Resp: Lungs clear bilaterally Cardio:  regular rate and rhythm GI: No hepatosplenomegaly, nontender Vascular: Trace pitting edema at the left greater than right lower leg, the left leg is slightly larger than the right side. Musculoskeletal: Left thigh without mass or tenderness. Left posterior thigh scar without evidence of recurrent tumor  Portacath/PICC-without erythema  Lab Results:  Lab Results  Component Value Date   WBC 17.6 (H) 04/30/2016   HGB 12.0 (L) 04/30/2016   HCT 35.9 (L) 04/30/2016   MCV 88.0 04/30/2016   PLT 118 (L) 04/30/2016   NEUTROABS 15.4 (H) 04/30/2016     Medications: I have reviewed the patient's current medications.  Assessment/Plan: 1. 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  PET scan 03/20/2016 with extensive bulky hypermetabolic nodal activity in the neck, chest, abdomen and pelvis; mild splenomegaly with diffusely mildly accentuated splenic activity  Staging bone marrow biopsy 03/23/2016-negative for involvement with lymphoma  Cycle 1 EPOCH beginning 03/23/2016  Cycle 2 [facility] 04/13/2016  Restaging PET scan at [name] 05/08/2016-lymph nodes with various degrees of FDG activity in the neck, axilla, right written him, mesentery, pelvis, and groin. Indeterminate liver lesions without FDG activity, nodular mass in the posterior medial aspect of the left thigh 2. 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 He entered clinical remission following chemotherapy and remained in remission when he was seen at the cancer center 02/24/2009. 3. 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 Staging PET scan 08/23/2010 with increased FDG activity at the left tonsillar fossa and no additional evidence of lymphoma. He completed 4 cycles of R-ICE with cycle #1 beginning on 09/19/2010 and cycle #4 on 11/21/2010. Repeat head and neck examination by Dr. Constance Holster following R-ICE/rituximab showed  no residual lymphoma. He began radiation  consolidation on 12/18/2010, radiation was completed on 01/22/2011. 4. History of neutropenia secondary to rituximab. 5. Anemia secondary to chemotherapy, status post a red blood cell transfusion 11/30/2010. The hemoglobin has normalized. 6. History of mild thrombocytopenia secondary to chemotherapy. 7. Hypertension. 8. Left thigh mass-status post surgical excision [facility] confirming a schwannoma  PET scan at [name] 05/08/2017-nodular mass in the left thigh adjacent to the femur 9. Port-A-Cath placement 03/21/2016 10. Superficial thrombus left greater saphenous vein 04/13/2016. Lovenox initiated, converted to Xarelto beginning 04/18/2016    Disposition:  [name] appears stable. He was seen in consultation at [name]. Another cycle of EPOCH is recommended prior to stem cell therapy. They also recommend post transplant consolidation with lenalidomide and ipilumumab on protocol. [name] will be admitted for cycle 3 EPOCH on 05/18/2016.  He has normal heart function and has been treated to a chemotherapy Adriamycin dose of 380 mg/m. The plan is to continue with doxorubicin in this cycle. I will contact the [place] service to be sure they are in agreement. He is scheduled for a transplant evaluation at [facility] next week.  He will bring the PET scan images from [name] for review here. We will compared to the ACE line PET and review the left thigh images. I will ask Dr. Dalbert Batman to consider the indication for a biopsy of the thigh mass.  [name], MD  05/17/2016  8:40 AM

## 2016-05-18 ENCOUNTER — Telehealth: Payer: Self-pay | Admitting: *Deleted

## 2016-05-18 ENCOUNTER — Inpatient Hospital Stay (HOSPITAL_COMMUNITY)
Admission: AD | Admit: 2016-05-18 | Discharge: 2016-05-22 | DRG: 847 | Disposition: A | Discharge status: Home / Self Care | Payer: 59 | Source: Ambulatory Visit | Attending: Oncology | Admitting: Oncology

## 2016-05-18 ENCOUNTER — Encounter (HOSPITAL_COMMUNITY): Payer: Self-pay

## 2016-05-18 DIAGNOSIS — Z5111 Encounter for antineoplastic chemotherapy: Principal | ICD-10-CM

## 2016-05-18 DIAGNOSIS — Z79899 Other long term (current) drug therapy: Secondary | ICD-10-CM | POA: Diagnosis not present

## 2016-05-18 DIAGNOSIS — K769 Liver disease, unspecified: Secondary | ICD-10-CM | POA: Diagnosis present

## 2016-05-18 DIAGNOSIS — Z880 Allergy status to penicillin: Secondary | ICD-10-CM

## 2016-05-18 DIAGNOSIS — I10 Essential (primary) hypertension: Secondary | ICD-10-CM | POA: Diagnosis present

## 2016-05-18 DIAGNOSIS — C8441 Peripheral T-cell lymphoma, not classified, lymph nodes of head, face, and neck: Secondary | ICD-10-CM | POA: Diagnosis present

## 2016-05-18 DIAGNOSIS — C8590 Non-Hodgkin lymphoma, unspecified, unspecified site: Secondary | ICD-10-CM

## 2016-05-18 DIAGNOSIS — Z923 Personal history of irradiation: Secondary | ICD-10-CM

## 2016-05-18 DIAGNOSIS — C866 Primary cutaneous CD30-positive T-cell proliferations: Secondary | ICD-10-CM | POA: Diagnosis not present

## 2016-05-18 DIAGNOSIS — Z7901 Long term (current) use of anticoagulants: Secondary | ICD-10-CM

## 2016-05-18 LAB — CBC WITH DIFFERENTIAL/PLATELET
Basophils Absolute: 0 10*3/uL (ref 0.0–0.1)
Basophils Relative: 0 %
EOS ABS: 0.8 10*3/uL — AB (ref 0.0–0.7)
EOS PCT: 11 %
HCT: 35.8 % — ABNORMAL LOW (ref 39.0–52.0)
Hemoglobin: 12.1 g/dL — ABNORMAL LOW (ref 13.0–17.0)
LYMPHS ABS: 1.2 10*3/uL (ref 0.7–4.0)
LYMPHS PCT: 16 %
MCH: 30.1 pg (ref 26.0–34.0)
MCHC: 33.8 g/dL (ref 30.0–36.0)
MCV: 89.1 fL (ref 78.0–100.0)
MONO ABS: 0.3 10*3/uL (ref 0.1–1.0)
MONOS PCT: 3 %
Neutro Abs: 5.3 10*3/uL (ref 1.7–7.7)
Neutrophils Relative %: 70 %
PLATELETS: 256 10*3/uL (ref 150–400)
RBC: 4.02 MIL/uL — ABNORMAL LOW (ref 4.22–5.81)
RDW: 17.1 % — AB (ref 11.5–15.5)
WBC: 7.6 10*3/uL (ref 4.0–10.5)

## 2016-05-18 LAB — URIC ACID: Uric Acid, Serum: 7 mg/dL (ref 4.4–7.6)

## 2016-05-18 LAB — COMPREHENSIVE METABOLIC PANEL
ALT: 24 U/L (ref 17–63)
AST: 26 U/L (ref 15–41)
Albumin: 3.7 g/dL (ref 3.5–5.0)
Alkaline Phosphatase: 24 U/L — ABNORMAL LOW (ref 38–126)
Anion gap: 8 (ref 5–15)
BUN: 15 mg/dL (ref 6–20)
CHLORIDE: 106 mmol/L (ref 101–111)
CO2: 27 mmol/L (ref 22–32)
Calcium: 9 mg/dL (ref 8.9–10.3)
Creatinine, Ser: 0.8 mg/dL (ref 0.61–1.24)
GFR calc Af Amer: >60 mL/min (ref >60)
GLUCOSE: 86 mg/dL (ref 65–99)
POTASSIUM: 3.7 mmol/L (ref 3.5–5.1)
SODIUM: 141 mmol/L (ref 135–145)
Total Bilirubin: 0.7 mg/dL (ref 0.3–1.2)
Total Protein: 6.1 g/dL — ABNORMAL LOW (ref 6.5–8.1)

## 2016-05-18 LAB — LACTATE DEHYDROGENASE: LDH: 154 U/L (ref 98–192)

## 2016-05-18 MED ORDER — PALONOSETRON HCL INJECTION 0.25 MG/5ML
0.2500 mg | Freq: Once | INTRAVENOUS | Status: AC
  Administered 2016-05-18: 0.25 mg via INTRAVENOUS
  Filled 2016-05-18 (×2): qty 5

## 2016-05-18 MED ORDER — LORAZEPAM 0.5 MG PO TABS: 0.5000 mg | ORAL_TABLET | Freq: Every evening | ORAL | Status: DC | PRN

## 2016-05-18 MED ORDER — HOT PACK MISC ONCOLOGY
1.0000 | Freq: Once | Status: AC | PRN
  Filled 2016-05-18: qty 1

## 2016-05-18 MED ORDER — HEPARIN SOD (PORK) LOCK FLUSH 100 UNIT/ML IV SOLN: 250.0000 [IU] | Freq: Once | INTRAVENOUS | Status: DC | PRN

## 2016-05-18 MED ORDER — SODIUM CHLORIDE 0.9% FLUSH: 3.0000 mL | INTRAVENOUS | Status: DC | PRN

## 2016-05-18 MED ORDER — SODIUM CHLORIDE 0.9% FLUSH: 10.0000 mL | INTRAVENOUS | Status: DC | PRN

## 2016-05-18 MED ORDER — HEPARIN SOD (PORK) LOCK FLUSH 100 UNIT/ML IV SOLN: 500.0000 [IU] | Freq: Once | INTRAVENOUS | Status: DC | PRN

## 2016-05-18 MED ORDER — SODIUM CHLORIDE 0.9 % IV SOLN
Freq: Once | INTRAVENOUS | Status: AC
  Administered 2016-05-18: 15:00:00 via INTRAVENOUS

## 2016-05-18 MED ORDER — ACETAMINOPHEN 500 MG PO TABS: 1000.0000 mg | ORAL_TABLET | Freq: Four times a day (QID) | ORAL | Status: DC | PRN

## 2016-05-18 MED ORDER — SODIUM CHLORIDE 0.9 % IV SOLN
10.0000 mg | Freq: Once | INTRAVENOUS | Status: AC
  Administered 2016-05-18: 10 mg via INTRAVENOUS
  Filled 2016-05-18: qty 1

## 2016-05-18 MED ORDER — RIVAROXABAN 20 MG PO TABS
20.0000 mg | ORAL_TABLET | Freq: Every day | ORAL | Status: DC
  Administered 2016-05-18 – 2016-05-21 (×4): 20 mg via ORAL
  Filled 2016-05-18 (×4): qty 1

## 2016-05-18 MED ORDER — PROCHLORPERAZINE MALEATE 10 MG PO TABS
10.0000 mg | ORAL_TABLET | Freq: Four times a day (QID) | ORAL | Status: DC | PRN
  Administered 2016-05-20: 10 mg via ORAL
  Filled 2016-05-18: qty 1

## 2016-05-18 MED ORDER — ALTEPLASE 2 MG IJ SOLR
2.0000 mg | Freq: Once | INTRAMUSCULAR | Status: DC | PRN
  Filled 2016-05-18: qty 2

## 2016-05-18 MED ORDER — LORAZEPAM 0.5 MG PO TABS
0.5000 mg | ORAL_TABLET | Freq: Every evening | ORAL | Status: DC | PRN
  Administered 2016-05-18: 0.5 mg via ORAL
  Administered 2016-05-19 – 2016-05-21 (×3): 1 mg via ORAL
  Filled 2016-05-18 (×2): qty 2
  Filled 2016-05-18: qty 1
  Filled 2016-05-18: qty 2

## 2016-05-18 MED ORDER — POLYETHYLENE GLYCOL 3350 17 G PO PACK
17.0000 g | PACK | Freq: Every day | ORAL | Status: DC | PRN
  Administered 2016-05-20 – 2016-05-21 (×2): 17 g via ORAL
  Filled 2016-05-18 (×2): qty 1

## 2016-05-18 MED ORDER — COLD PACK MISC ONCOLOGY
1.0000 | Freq: Once | Status: AC | PRN
  Filled 2016-05-18: qty 1

## 2016-05-18 MED ORDER — SODIUM CHLORIDE 0.9 % IV SOLN
INTRAVENOUS | Status: DC
Start: 2016-05-18 — End: 2016-05-22
  Administered 2016-05-18 – 2016-05-22 (×5): via INTRAVENOUS

## 2016-05-18 MED ORDER — LIDOCAINE-PRILOCAINE 2.5-2.5 % EX CREA: 1.0000 "application " | TOPICAL_CREAM | CUTANEOUS | Status: DC | PRN

## 2016-05-18 MED ORDER — VINCRISTINE SULFATE CHEMO INJECTION 1 MG/ML
Freq: Once | INTRAVENOUS | Status: AC
  Administered 2016-05-18: 15:00:00 via INTRAVENOUS
  Filled 2016-05-18 (×2): qty 10

## 2016-05-18 MED ORDER — ATENOLOL 50 MG PO TABS
50.0000 mg | ORAL_TABLET | Freq: Every day | ORAL | Status: DC
  Administered 2016-05-19 – 2016-05-22 (×4): 50 mg via ORAL
  Filled 2016-05-18 (×4): qty 1

## 2016-05-18 NOTE — H&P (Signed)
Admission History and Physical      Chief Complaint: T-cell lymphoma HPI: 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 He recently developed diffuse palpable lymphadenopathy and sweats. Biopsy of right cervical adenopathy on 03/14/2016 showed T-cell lymphoma, CD30 positive, ALK negative. Staging PET scan 03/20/2016 showed extensive bulky hypermetabolic nodal activity in the neck, chest, abdomen and pelvis; mild splenomegaly. He completed cycle 1 EPOCH beginning 03/23/2016. He had subsequent marked improvement in the palpable lymphadenopathy. The sweats resolved. He completed cycle 2 [facility] 04/13/2016.  He was seen in consultation at [facility]. Restaging PET scan at [facility] on 05/08/2016 showed lymph nodes with various degrees of FDG activity in the neck, axilla, retroperitoneum, mesentery, pelvis and groin. There were indeterminate liver lesions without FDG activity and a nodular mass in the posterior medial aspect of the left thigh. Another cycle of EPOCH is recommended prior to stem cell therapy. They also recommended post transplant consolidation with lenalidomide and ipilumumab on protocol.  He also saw cardiology at [facility]. He was placed on telmisartan as a cardioprotective agent. An echocardiogram on 05/08/2016 revealed normal left ventricular size and function. The left ventricle ejection fraction was measured in the range of 62%.  He presents today for elective admission to proceed with cycle 3 EPOCH.    Past Medical History:  Diagnosis Date  . History of radiation therapy 12/18/10 to 01/22/11   left oropharynx  . Hypertension   . Neutropenia    Secondary to Rituxan  . Non Hodgkin's lymphoma ([place])    s/p 4 cycles R-ICE, start 11/21/10  . T-cell lymphoma ([place]) 03/14/2016    Past Surgical  History:  Procedure Laterality Date  . BIOPSY PHARYNX     09/20/10 excision right posterior pharynx  . BONE MARROW ASPIRATION      09/08/10 Biopsy , and clot, left iliac crest  . IR GENERIC HISTORICAL  03/21/2016   IR FLUORO GUIDE PORT INSERTION RIGHT 03/21/2016 Arne Cleveland, MD [facility]INTERV RAD  . IR GENERIC HISTORICAL  03/21/2016   IR US GUIDE VASC ACCESS RIGHT 03/21/2016 Arne Cleveland, MD [facility]INTER[facility]  . PORTACATH PLACEMENT  09/19/10  . TONSILLECTOMY     Bilateral    Medications Prior to Admission  Medication Sig Dispense Refill  . acetaminophen (TYLENOL) 500 MG tablet Take 1,000 mg by mouth every 6 (six) hours as needed for mild pain, moderate pain, fever or headache.    Marland Kitchen atenolol (TENORMIN) 50 MG tablet Take 50 mg by mouth daily.     . B Complex-C (B-COMPLEX WITH VITAMIN C) tablet Take 1 tablet by mouth daily.    Marland Kitchen lidocaine-prilocaine (EMLA) cream Apply 1 application topically as needed. Apply one hour prior to port access and cover with plastic wrap 30 g 1  . LORazepam (ATIVAN) 0.5 MG tablet May take 1-2 tablets,  0.'5mg'$ -1 mg at bedtime as needed for sleep 30 tablet 0  . magic mouthwash SOLN Equal parts Nystatin, Diphenhydramine and Maalox 5-10 ml four times a day as needed, swish and spit. 120 mL 0  . ondansetron (ZOFRAN) 8 MG tablet Take 1 tablet (8 mg total) by mouth every 6 (six) hours as needed for nausea. 20 tablet 2  . prochlorperazine (COMPAZINE) 10 MG tablet Take 1 tablet (10 mg total) by mouth every 6 (six) hours as needed for nausea or vomiting. 30 tablet 0  .  rivaroxaban (XARELTO) 20 MG TABS tablet Take 1 tablet (20 mg total) by mouth daily with supper. 30 tablet 3  . allopurinol (ZYLOPRIM) 300 MG tablet Take 1 tablet (300 mg total) by mouth daily. 10 tablet 0   Scheduled Meds: . [START ON 05/19/2016] atenolol  50 mg Oral Daily  . DOXOrubicin/vinCRIStine/etoposide CHEMO IV infusion for OP CI   Intravenous Once  . rivaroxaban  20 mg Oral Q supper   Continuous Infusions: .  sodium chloride 75 mL/hr at 05/18/16 1430   PRN Meds:.acetaminophen, alteplase, Cold Pack, heparin lock flush, heparin lock flush, Hot Pack, lidocaine-prilocaine, LORazepam, polyethylene glycol, prochlorperazine, sodium chloride flush, sodium chloride flush  Allergies  Allergen Reactions  . Penicillins Other (See Comments)    Childhood allergy reaction unknown  Has patient had a PCN reaction causing immediate rash, facial/tongue/throat swelling, SOB or lightheadedness with hypotension: Unknown Has patient had a PCN reaction causing severe rash involving mucus membranes or skin necrosis: Unknown Has patient had a PCN reaction that required hospitalization: Unknown Has patient had a PCN reaction occurring within the last 10 years: Unknown If all of the above answers are "NO", then may proceed with Cephalosporin use.     Family history: Noncontributory. No family history of cancer.  Social history: He lives in [place]. He is married. No tobacco use.  ROS: He reports feeling well. No palpable lymph nodes. No fevers or night sweats. Good appetite. No pain in the left thigh. A complete review of systems was performed and was negative.  Physical:  Blood pressure 116/79, pulse 66, temperature 98.7 F (37.1 C), temperature source Oral, resp. rate 18, height '5\' 8"'$  (1.727 m), weight 193 lb (87.5 kg), SpO2 99 %.  HEENT: No thrush or ulcers. Neck without mass. Chest: Lungs clear bilaterally. Cardiovascular: Regular rate and rhythm. Abdomen: No organomegaly. Nontender. Extremities: Trace pitting edema at the left greater than right lower leg. Left leg is slightly larger than the right leg. Musculoskeletal: Left thigh without mass or tenderness. Left posterior thigh scar without evidence of recurrent tumor. Port-A-Cath without erythema.  Labs:  Results for orders placed or performed during the hospital encounter of 05/18/16 (from the past 48 hour(s))  Comprehensive metabolic panel      Status: Abnormal   Collection Time: 05/18/16 12:50 PM  Result Value Ref Range   Sodium 141 135 - 145 mmol/L   Potassium 3.7 3.5 - 5.1 mmol/L   Chloride 106 101 - 111 mmol/L   CO2 27 22 - 32 mmol/L   Glucose, Bld 86 65 - 99 mg/dL   BUN 15 6 - 20 mg/dL   Creatinine, Ser 0.80 0.61 - 1.24 mg/dL   Calcium 9.0 8.9 - 10.3 mg/dL   Total Protein 6.1 (L) 6.5 - 8.1 g/dL   Albumin 3.7 3.5 - 5.0 g/dL   AST 26 15 - 41 U/L   ALT 24 17 - 63 U/L   Alkaline Phosphatase 24 (L) 38 - 126 U/L   Total Bilirubin 0.7 0.3 - 1.2 mg/dL   GFR calc non Af Amer >60 >60 mL/min   GFR calc Af Amer >60 >60 mL/min    Comment: (NOTE) The eGFR has been calculated using the CKD EPI equation. This calculation has not been validated in all clinical situations. eGFR's persistently <60 mL/min signify possible Chronic Kidney Disease.    Anion gap 8 5 - 15  CBC WITH DIFFERENTIAL     Status: Abnormal   Collection Time: 05/18/16 12:50 PM  Result Value Ref  Range   WBC 7.6 4.0 - 10.5 K/uL   RBC 4.02 (L) 4.22 - 5.81 MIL/uL   Hemoglobin 12.1 (L) 13.0 - 17.0 g/dL   HCT 35.8 (L) 39.0 - 52.0 %   MCV 89.1 78.0 - 100.0 fL   MCH 30.1 26.0 - 34.0 pg   MCHC 33.8 30.0 - 36.0 g/dL   RDW 17.1 (H) 11.5 - 15.5 %   Platelets 256 150 - 400 K/uL   Neutrophils Relative % 70 %   Neutro Abs 5.3 1.7 - 7.7 K/uL   Lymphocytes Relative 16 %   Lymphs Abs 1.2 0.7 - 4.0 K/uL   Monocytes Relative 3 %   Monocytes Absolute 0.3 0.1 - 1.0 K/uL   Eosinophils Relative 11 %   Eosinophils Absolute 0.8 (H) 0.0 - 0.7 K/uL   Basophils Relative 0 %   Basophils Absolute 0.0 0.0 - 0.1 K/uL  Lactate dehydrogenase     Status: None   Collection Time: 05/18/16 12:50 PM  Result Value Ref Range   LDH 154 98 - 192 U/L  Uric acid     Status: None   Collection Time: 05/18/16 12:50 PM  Result Value Ref Range   Uric Acid, Serum 7.0 4.4 - 7.6 mg/dL     Assessment/Plan  1. 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  PET scan 03/20/2016 with extensive bulky hypermetabolic nodal activity in the neck, chest, abdomen and pelvis; mild splenomegaly with diffusely mildly accentuated splenic activity  Staging bone marrow biopsy 03/23/2016-negative for involvement with lymphoma  Cycle 1 EPOCH beginning 03/23/2016  Cycle 2 [facility] 04/13/2016  Restaging PET scan at [facility] 05/08/2016-lymph nodes with various degrees of FDG activity in the neck, axilla, retroperitoneum, mesentery, pelvis, and groin. Indeterminate liver lesions without FDG activity, nodular mass in the posterior medial aspect of the left thigh 2. 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 He entered clinical remission following chemotherapy and remained in remission when he was seen at the cancer center 02/24/2009. 3. 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 Staging PET scan 08/23/2010 with increased FDG activity at the left tonsillar fossa and no additional evidence of lymphoma. He completed 4 cycles of R-ICE with cycle #1 beginning on 09/19/2010 and cycle #4 on 11/21/2010. Repeat head and neck examination by Dr. Constance Holster following R-ICE/rituximab showed no residual lymphoma. He began radiation consolidation on 12/18/2010, radiation was completed on 01/22/2011. 4. History of neutropenia secondary to rituximab. 5. Anemia secondary to chemotherapy, status post a red  blood cell transfusion 11/30/2010. The hemoglobin has normalized. 6. History of mild thrombocytopenia secondary to chemotherapy. 7. Hypertension. 8. 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 05/08/2017-nodular mass in the left thigh adjacent to the femur 9. Port-A-Cath placement 03/21/2016 10. Superficial thrombus left greater saphenous vein 04/13/2016. Lovenox initiated, converted to Xarelto beginning 04/18/2016   [name] is a 56 year old man with T-cell lymphoma. He has completed 2 cycles of EPOCH. He was  seen in consultation at [facility]. Another cycle of EPOCH is recommended prior to stem cell therapy. He is being admitted to proceed with cycle 3 EPOCH.  [name] ANP/GNP-BC 05/18/2016, 4:44 PM   [name] is now admitted for cycle 3 Epoch. He should complete chemotherapy in the early a.m. on 05/22/2016. He will receive Neulasta as an outpatient on 05/23/2016. We received the PET images from [facility]. I will review these in radiology for a direct comparison to the 03/20/2016 PET from [facility]. We will also decide on additional imaging of the left thigh "mass "like lesion noted on the [facility] PET.

## 2016-05-18 NOTE — Progress Notes (Signed)
Chemo concentrations and volume documentation. All were correct. Verified with Laural Benes, RN

## 2016-05-18 NOTE — Telephone Encounter (Signed)
Per [place], chemo admission has been approved.  Called pt to report for admission. Charge RN and [place], inpatient pharmacist made aware.

## 2016-05-18 NOTE — Telephone Encounter (Addendum)
Received CD from MD [facility]. CD taken to [facility] radiology to be uploaded into system.

## 2016-05-18 NOTE — Telephone Encounter (Signed)
Spoke with pt's wife, informed her that insurance company has not yet approved this cycle of chemo. We are awaiting their decision, will call if approved today. She voiced understanding.

## 2016-05-18 NOTE — Telephone Encounter (Signed)
Message from pt, he contacted insurance company re: chemo admission. They told him they will expedite review.

## 2016-05-19 DIAGNOSIS — I10 Essential (primary) hypertension: Secondary | ICD-10-CM

## 2016-05-19 DIAGNOSIS — C866 Primary cutaneous CD30-positive T-cell proliferations: Secondary | ICD-10-CM

## 2016-05-19 MED ORDER — HOT PACK MISC ONCOLOGY
1.0000 | Freq: Once | Status: AC | PRN
Start: 2016-05-19 — End: 2016-05-19
  Filled 2016-05-19: qty 1

## 2016-05-19 MED ORDER — SODIUM CHLORIDE 0.9 % IV SOLN: Freq: Once | INTRAVENOUS | Status: DC

## 2016-05-19 MED ORDER — SODIUM CHLORIDE 0.9% FLUSH: 10.0000 mL | INTRAVENOUS | Status: DC | PRN

## 2016-05-19 MED ORDER — VINCRISTINE SULFATE CHEMO INJECTION 1 MG/ML
Freq: Once | INTRAVENOUS | Status: AC
  Administered 2016-05-19: 14:00:00 via INTRAVENOUS
  Filled 2016-05-19: qty 10

## 2016-05-19 MED ORDER — SODIUM CHLORIDE 0.9 % IV SOLN
10.0000 mg | Freq: Once | INTRAVENOUS | Status: AC
  Administered 2016-05-19: 10 mg via INTRAVENOUS
  Filled 2016-05-19: qty 1

## 2016-05-19 MED ORDER — COLD PACK MISC ONCOLOGY
1.0000 | Freq: Once | Status: AC | PRN
  Filled 2016-05-19: qty 1

## 2016-05-19 MED ORDER — SODIUM CHLORIDE 0.9% FLUSH: 3.0000 mL | INTRAVENOUS | Status: DC | PRN

## 2016-05-19 NOTE — Progress Notes (Signed)
Chemo dosage and calculations checked with cindy Copywriter, advertising.

## 2016-05-19 NOTE — Progress Notes (Signed)
IP PROGRESS NOTE  Subjective:   [name] feels well this morning without any recent complaints. He received chemotherapy for the last 18 hours or so without complications. He denied any nausea, vomiting, abdominal pain or diarrhea. He denied any mouth pain or irritation.  Objective:  Vital signs in last 24 hours: Temp:  [97.5 F (36.4 C)-98.7 F (37.1 C)] 97.6 F (36.4 C) (04/28 0429) Pulse Rate:  [66-75] 71 (04/28 0429) Resp:  [16-18] 16 (04/28 0429) BP: (114-139)/(74-86) 114/74 (04/28 0429) SpO2:  [97 %-99 %] 97 % (04/28 0429) Weight:  [193 lb (87.5 kg)] 193 lb (87.5 kg) (04/27 1230) Weight change:  Last BM Date: 05/18/16  Intake/Output from previous day: 04/27 0701 - 04/28 0700 In: 120 [P.O.:120] Out: 1900 [Urine:1900] Alert, awake gentleman without distress. Mouth: mucous membranes moist, pharynx normal without lesions Resp: clear to auscultation bilaterally Cardio: regular rate and rhythm, S1, S2 normal, no murmur, click, rub or gallop GI: soft, non-tender; bowel sounds normal; no masses,  no organomegaly Extremities: extremities normal, atraumatic, no cyanosis or edema  Portacath without induration or tenderness.  Lab Results:  Recent Labs  05/18/16 1250  WBC 7.6  HGB 12.1*  HCT 35.8*  PLT 256    BMET  Recent Labs  05/18/16 1250  NA 141  K 3.7  CL 106  CO2 27  GLUCOSE 86  BUN 15  CREATININE 0.80  CALCIUM 9.0     Medications: I have reviewed the patient's current medications.  Assessment/Plan:  1. T-cell lymphoma, CD30 positive, ALK negative presenting with diffuse palpable lymphadenopathy, sweats February 2018.  He is currently receiving EPOCH chemotherapy via continuous infusion. This is the third cycle prior to possible high-dose chemotherapy and autologous stem cell transplant.  He has tolerated the first day of chemotherapy without complications and anticipate the second bag of chemotherapy to start around 1 PM this afternoon and will run  over 22 hours. He will tentatively complete infusion on Tuesday morning May 1.  2. Hypertension: Blood pressure appeared within normal range.  3. DVT prophylaxis: I encouraged ambulation at this time.  4. Antiemetics: When necessary antinausea medication will be available to him.  5. Discharge planning: Anticipate completion of last bag of chemotherapy on the morning of Tuesday, May 1.   LOS: 1 day   [facility] 05/19/2016, 8:29 AM

## 2016-05-20 MED ORDER — DEXAMETHASONE SODIUM PHOSPHATE 100 MG/10ML IJ SOLN
10.0000 mg | Freq: Once | INTRAMUSCULAR | Status: AC
  Administered 2016-05-20: 10 mg via INTRAVENOUS
  Filled 2016-05-20: qty 1

## 2016-05-20 MED ORDER — VINCRISTINE SULFATE CHEMO INJECTION 1 MG/ML
Freq: Once | INTRAVENOUS | Status: AC
  Administered 2016-05-20: 12:00:00 via INTRAVENOUS
  Filled 2016-05-20: qty 10

## 2016-05-20 MED ORDER — SODIUM CHLORIDE 0.9 % IV SOLN: Freq: Once | INTRAVENOUS | Status: DC

## 2016-05-20 MED ORDER — SODIUM CHLORIDE 0.9% FLUSH: 10.0000 mL | INTRAVENOUS | Status: DC | PRN

## 2016-05-20 MED ORDER — SODIUM CHLORIDE 0.9% FLUSH: 3.0000 mL | INTRAVENOUS | Status: DC | PRN

## 2016-05-20 MED ORDER — PALONOSETRON HCL INJECTION 0.25 MG/5ML
0.2500 mg | Freq: Once | INTRAVENOUS | Status: AC
  Administered 2016-05-20: 0.25 mg via INTRAVENOUS
  Filled 2016-05-20: qty 5

## 2016-05-20 MED ORDER — COLD PACK MISC ONCOLOGY
1.0000 | Freq: Once | Status: AC | PRN
  Filled 2016-05-20: qty 1

## 2016-05-20 MED ORDER — HOT PACK MISC ONCOLOGY
1.0000 | Freq: Once | Status: AC | PRN
  Filled 2016-05-20: qty 1

## 2016-05-20 NOTE — Progress Notes (Signed)
IP PROGRESS NOTE  Subjective:   [name] continues to do very well without complaints. He denied any nausea, vomiting, abdominal pain or diarrhea. He denied any mucositis or constipation. Chemotherapy continues to be infused without complications.  Objective:  Vital signs in last 24 hours: Temp:  [97.7 F (36.5 C)-98 F (36.7 C)] 98 F (36.7 C) (04/29 0452) Pulse Rate:  [69-71] 70 (04/29 0452) Resp:  [16-18] 16 (04/29 0452) BP: (100-139)/(55-83) 100/55 (04/29 0452) SpO2:  [98 %-100 %] 98 % (04/29 0452) Weight change:  Last BM Date: 05/18/16  Intake/Output from previous day: 04/28 0701 - 04/29 0700 In: 3538.7 [P.O.:1360; I.V.:2062.5; IV Piggyback:116.2] Out: 4800 [Urine:4800] Well-appearing gentleman without distress. Mouth: mucous membranes moist, pharynx normal without lesions Resp: clear to auscultation bilaterally Cardio: regular rate and rhythm, S1, S2 normal, no murmur, click, rub or gallop GI: soft, non-tender; bowel sounds normal; no masses,  no organomegaly Extremities: extremities normal, atraumatic, no cyanosis or edema  Portacath without induration or tenderness.  Lab Results:  Recent Labs  05/18/16 1250  WBC 7.6  HGB 12.1*  HCT 35.8*  PLT 256    BMET  Recent Labs  05/18/16 1250  NA 141  K 3.7  CL 106  CO2 27  GLUCOSE 86  BUN 15  CREATININE 0.80  CALCIUM 9.0     Medications: I have reviewed the patient's current medications.  Assessment/Plan:  1. T-cell lymphoma, CD30 positive, ALK negative presenting with diffuse palpable lymphadenopathy, sweats February 2018.  He is currently receiving EPOCH chemotherapy via continuous infusion. This is the third cycle prior to possible high-dose chemotherapy and autologous stem cell transplant.  He continues to tolerate chemotherapy without complications. The plan is to continue with the same dose and schedule and infusion rate.  2. Hypertension: Blood pressure not dramatically changed.  3. DVT  prophylaxis: I encouraged ambulation at this time. He is walking rigorously around the unit without issues.  4. Antiemetics: When necessary antinausea medication will be available to him.  5. Constipation: We'll make sure he has laxatives as needed.  6. Discharge planning: Anticipate completion of last bag of chemotherapy on the morning of Tuesday, May 1.   LOS: 2 days   [name],[name] 05/20/2016, 8:11 AM

## 2016-05-20 NOTE — Progress Notes (Signed)
Chemotherapy dosages and calculations done with cindy Hughey RN. 

## 2016-05-21 ENCOUNTER — Telehealth: Payer: Self-pay | Admitting: Oncology

## 2016-05-21 MED ORDER — HOT PACK MISC ONCOLOGY
1.0000 | Freq: Once | Status: AC | PRN
  Filled 2016-05-21: qty 1

## 2016-05-21 MED ORDER — FAMOTIDINE 20 MG PO TABS
20.0000 mg | ORAL_TABLET | Freq: Every day | ORAL | Status: DC
  Administered 2016-05-21 – 2016-05-22 (×2): 20 mg via ORAL
  Filled 2016-05-21 (×2): qty 1

## 2016-05-21 MED ORDER — SODIUM CHLORIDE 0.9% FLUSH: 3.0000 mL | INTRAVENOUS | Status: DC | PRN

## 2016-05-21 MED ORDER — VINCRISTINE SULFATE CHEMO INJECTION 1 MG/ML
Freq: Once | INTRAVENOUS | Status: AC
  Administered 2016-05-21: 11:00:00 via INTRAVENOUS
  Filled 2016-05-21: qty 10

## 2016-05-21 MED ORDER — SODIUM CHLORIDE 0.9 % IV SOLN: Freq: Once | INTRAVENOUS | Status: DC

## 2016-05-21 MED ORDER — COLD PACK MISC ONCOLOGY
1.0000 | Freq: Once | Status: AC | PRN
  Filled 2016-05-21: qty 1

## 2016-05-21 MED ORDER — DEXAMETHASONE SODIUM PHOSPHATE 100 MG/10ML IJ SOLN
10.0000 mg | Freq: Once | INTRAMUSCULAR | Status: AC
  Administered 2016-05-21: 10 mg via INTRAVENOUS
  Filled 2016-05-21: qty 1

## 2016-05-21 MED ORDER — SODIUM CHLORIDE 0.9% FLUSH: 10.0000 mL | INTRAVENOUS | Status: DC | PRN

## 2016-05-21 NOTE — Progress Notes (Signed)
IP PROGRESS NOTE  Subjective:  He had nausea yesterday, relieved with Compazine. He has noted erythema over the face and chest. No neuropathy symptoms. He is ambulating.  Objective: Vital signs in last 24 hours: Blood pressure 118/67, pulse 63, temperature 99.3 F (37.4 C), temperature source Oral, resp. rate 16, height 5' 8" (1.727 m), weight 193 lb (87.5 kg), SpO2 97 %.  Intake/Output from previous day: 04/29 0701 - 04/30 0700 In: 2198.2 [P.O.:240; I.V.:1800; IV Piggyback:158.2] Out: 3750 [Urine:3750]  Physical Exam:  HEENT: No thrush Lungs: Clear bilaterally Cardiac: Regular rate and rhythm Abdomen: Soft and nontender Extremities: No leg edema Skin: Flushing over the face and upper chest  Portacath/PICC-without erythema  Lab Results:  Recent Labs  05/18/16 1250  WBC 7.6  HGB 12.1*  HCT 35.8*  PLT 256    BMET  Recent Labs  05/18/16 1250  NA 141  K 3.7  CL 106  CO2 27  GLUCOSE 86  BUN 15  CREATININE 0.80  CALCIUM 9.0    Studies/Results: No results found.  Medications: I have reviewed the patient's current medications.  Assessment/Plan: 1. 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  PET scan 03/20/2016 with extensive bulky hypermetabolic nodal activity in the neck, chest, abdomen and pelvis; mild splenomegaly with diffusely mildly accentuated splenic activity  Staging bone marrow biopsy 03/23/2016-negative for involvement with lymphoma  Cycle 1 EPOCH beginning 03/23/2016  Cycle 2 EPOCH 04/13/2016  Cycle 3 EPOCH 05/18/2016    Restaging PET scan at [facility] 05/08/2016-lymph nodes with various degrees of FDG activity in the neck, axilla, retroperitoneum, mesentery, pelvis, and groin. Indeterminate liver lesions without FDG activity, nodular mass in the posterior medial aspect of the left thigh 2. 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 He entered clinical remission following chemotherapy and remained in remission when he was seen at the cancer center 02/24/2009. 3. 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 Staging PET scan 08/23/2010 with increased FDG activity at the left tonsillar fossa and no additional evidence of lymphoma. He completed 4 cycles of R-ICE with cycle #1 beginning on 09/19/2010 and cycle #4 on 11/21/2010. Repeat head and neck examination by Dr. Rosen following R-ICE/rituximab showed no residual lymphoma. He began radiation consolidation on 12/18/2010, radiation was completed on 01/22/2011. 4. History of neutropenia secondary to rituximab. 5. Anemia secondary to chemotherapy, status post a red blood cell transfusion 11/30/2010. The hemoglobin has normalized. 6. History of mild thrombocytopenia secondary to chemotherapy. 7. Hypertension. 8. 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 05/08/2017-nodular mass in the left thigh adjacent to the femur 9. Port-A-Cath placement 03/21/2016 10. Superficial thrombus left greater saphenous vein 04/13/2016. Lovenox initiated, converted to Xarelto beginning 04/18/2016  He appears to be tolerating the chemotherapy well. He will complete day for beginning today. The plan is for discharge to home after the completion of chemotherapy on 05/22/2016. We will review the [facility] PET scan  over the next 2   days to decide on the need for further imaging of the left thigh.   LOS: 3 days   SHERRILL, Jakie, MD   05/21/2016, 9:30 AM  

## 2016-05-21 NOTE — Telephone Encounter (Signed)
Patient bypassed scheduling area on 05/17/16.  Appointments confirmed with patient per 05/17/16 los.

## 2016-05-22 ENCOUNTER — Inpatient Hospital Stay: Payer: 59

## 2016-05-22 ENCOUNTER — Ambulatory Visit: Payer: Self-pay

## 2016-05-22 MED ORDER — SODIUM CHLORIDE 0.9 % IV SOLN: Freq: Once | INTRAVENOUS | Status: DC

## 2016-05-22 MED ORDER — SODIUM CHLORIDE 0.9 % IV SOLN
Freq: Once | INTRAVENOUS | Status: AC
  Administered 2016-05-22: 09:00:00 via INTRAVENOUS
  Filled 2016-05-22: qty 5

## 2016-05-22 MED ORDER — ALTEPLASE 2 MG IJ SOLR
2.0000 mg | Freq: Once | INTRAMUSCULAR | Status: DC | PRN
  Filled 2016-05-22: qty 2

## 2016-05-22 MED ORDER — HEPARIN SOD (PORK) LOCK FLUSH 100 UNIT/ML IV SOLN
500.0000 [IU] | Freq: Once | INTRAVENOUS | Status: AC | PRN
  Administered 2016-05-22: 500 [IU]
  Filled 2016-05-22: qty 5

## 2016-05-22 MED ORDER — HEPARIN SOD (PORK) LOCK FLUSH 100 UNIT/ML IV SOLN: 250.0000 [IU] | Freq: Once | INTRAVENOUS | Status: DC | PRN

## 2016-05-22 MED ORDER — COLD PACK MISC ONCOLOGY
1.0000 | Freq: Once | Status: DC | PRN
  Filled 2016-05-22: qty 1

## 2016-05-22 MED ORDER — SODIUM CHLORIDE 0.9% FLUSH: 10.0000 mL | INTRAVENOUS | Status: DC | PRN

## 2016-05-22 MED ORDER — SODIUM CHLORIDE 0.9 % IV SOLN
750.0000 mg/m2 | Freq: Once | INTRAVENOUS | Status: AC
  Administered 2016-05-22: 1560 mg via INTRAVENOUS
  Filled 2016-05-22: qty 78

## 2016-05-22 MED ORDER — SODIUM CHLORIDE 0.9% FLUSH: 3.0000 mL | INTRAVENOUS | Status: DC | PRN

## 2016-05-22 MED ORDER — HOT PACK MISC ONCOLOGY
1.0000 | Freq: Once | Status: DC | PRN
  Filled 2016-05-22: qty 1

## 2016-05-22 MED ORDER — PALONOSETRON HCL INJECTION 0.25 MG/5ML
0.2500 mg | Freq: Once | INTRAVENOUS | Status: AC
  Administered 2016-05-22: 0.25 mg via INTRAVENOUS
  Filled 2016-05-22: qty 5

## 2016-05-22 NOTE — Progress Notes (Signed)
Nursing Discharge Summary  Patient ID: DASAN HARDMAN MRN: [id number] DOB/AGE: August 29, 1960 56 y.o.  Admit date: 05/18/2016 Discharge date: 05/22/2016  Discharged Condition: good  Disposition: 01-Home or Self Care  Follow-up Information    [name], MD Follow up.   Specialty:  Oncology Contact information: [place] [address] [phone]           Prescriptions Given: No new prescriptions.  Patient follow up appointments and medications discussed with patient and wife.  Both verbalized understanding without further questions.    Means of Discharge: Patient will be taken downstairs via wheelchair to be discharged home via private vehicle.    Signed: Buel Ream 05/22/2016, 10:31 AM

## 2016-05-22 NOTE — Discharge Summary (Signed)
Physician Discharge Summary  Patient ID: [name] @ATTENDINGNPI @ MRN: [id number] DOB/AGE: 1960/11/21 56 y.o.  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[place])   Discharged Condition: Stable  Discharge Labs:  none  Significant Diagnostic Studies none  Consults: None  Procedures:  Infusional chemotherapy  Disposition: 01-Home or Self Care   Allergies as of 05/22/2016      Reactions   Penicillins Other (See Comments)   Childhood allergy reaction unknown  Has patient had a PCN reaction causing immediate rash, facial/tongue/throat swelling, SOB or lightheadedness with hypotension: Unknown Has patient had a PCN reaction causing severe rash involving mucus membranes or skin necrosis: Unknown Has patient had a PCN reaction that required hospitalization: Unknown Has patient had a PCN reaction occurring within the last 10 years: Unknown If all of the above answers are "NO", then may proceed with Cephalosporin use.      Medication List    STOP taking these medications   allopurinol 300 MG tablet Commonly known as:  ZYLOPRIM     TAKE these medications   acetaminophen 500 MG tablet Commonly known as:  TYLENOL Take 1,000 mg by mouth every 6 (six) hours as needed for mild pain, moderate pain, fever or headache.   atenolol 50 MG tablet Commonly known as:  TENORMIN Take 50 mg by mouth daily.   B-complex with vitamin C tablet Take 1 tablet by mouth daily.   lidocaine-prilocaine cream Commonly known as:  EMLA Apply 1 application topically as needed. Apply one hour prior to port access and cover with plastic wrap   LORazepam 0.5 MG tablet Commonly known as:  ATIVAN May take 1-2 tablets,  0.5mg -1 mg at bedtime as needed for sleep   magic mouthwash Soln Equal parts Nystatin, Diphenhydramine and Maalox 5-10 ml four times a day as needed, swish and spit.   ondansetron 8 MG tablet Commonly known as:   ZOFRAN Take 1 tablet (8 mg total) by mouth every 6 (six) hours as needed for nausea.   prochlorperazine 10 MG tablet Commonly known as:  COMPAZINE Take 1 tablet (10 mg total) by mouth every 6 (six) hours as needed for nausea or vomiting.   rivaroxaban 20 MG Tabs tablet Commonly known as:  XARELTO Take 1 tablet (20 mg total) by mouth daily with supper.       Follow-up Information    [name], MD Follow up.   Specialty:  Oncology Contact information: [place] [address] [phone]           Hospital Course: [name] was admitted on 05/18/2016 for cycle 3 EPOCH. He tolerated the chemotherapy without significant acute toxicity. Mild nausea was relieved with Compazine and Pepcid. He completed chemotherapy on the morning of 05/22/2016 and was discharged to home. He will return to the [facility] on 05/23/2016 for Neulasta.  I reviewed the PET scan from 03/20/2016 in radiology. There is a hypermetabolic lesion in the left thigh. The PET scan from [facility] had not been loaded into the electronic system at the time of discharge. This will be reviewed and we will arrange for an outpatient MRI of the left thigh as indicated.  [name] has a scheduled follow-up appointment next week.     Discharge Instructions    Discharge patient    Complete by:  As directed    Discharge disposition:  01-Home or Self Care   Discharge patient date:  05/22/2016  SCHEDULING COMMUNICATION    Complete by:  As directed    Chemotherapy Appointment - 2 hr   SCHEDULING COMMUNICATION    Complete by:  As directed    Chemotherapy Appointment - 2 hr   [facility] NURSING COMMUNICATION    Complete by:  As directed    May order Prednisone 60mg /m2 po to be given in clinic if patient did not take at home.   [facility] NURSING COMMUNICATION    Complete by:  As directed    May order Prednisone 60mg /m2 po to be given in clinic if patient did not take at home.   [facility]  NURSING COMMUNICATION    Complete by:  As directed    May order Prednisone 60mg /m2 po to be given in clinic if patient did not take at home.   TREATMENT CONDITIONS    Complete by:  As directed    Patient should have CBC & CMP within 7 days prior to chemotherapy administration. NOTIFY MD IF: ANC < 1500, Hemoglobin < 8, PLT < 100,000,  Total Bili > 1.5, Creatinine > 1.5, ALT & AST > 80 or if patient has unstable vital signs: Temperature > 38.5, SBP > 180 or < 90, RR > 30 or HR > 100.   TREATMENT CONDITIONS    Complete by:  As directed    Patient should have CBC & CMP within 7 days prior to chemotherapy administration. NOTIFY MD IF: ANC < 1500, Hemoglobin < 8, PLT < 100,000,  Total Bili > 1.5, Creatinine > 1.5, ALT & AST > 80 or if patient has unstable vital signs: Temperature > 38.5, SBP > 180 or < 90, RR > 30 or HR > 100.      Signed: [name], MD 05/22/2016, 5:29 PM

## 2016-05-22 NOTE — Progress Notes (Signed)
EPOCH continuous at 28 ml/hr. This chemo is due to be completed 0900 this am. Cytoxan is scheduled there after and possible discharge home afterwards. No issues during the night. Spouse at bedside sleep. No scheduled labs this morning. Will continue to monitor.

## 2016-05-22 NOTE — Discharge Instructions (Signed)
Call for fever, bleeding

## 2016-05-23 ENCOUNTER — Ambulatory Visit (HOSPITAL_BASED_OUTPATIENT_CLINIC_OR_DEPARTMENT_OTHER): Payer: 59

## 2016-05-23 VITALS — BP 114/63 | HR 57 | Temp 98.0°F | Resp 16

## 2016-05-23 DIAGNOSIS — C8590 Non-Hodgkin lymphoma, unspecified, unspecified site: Secondary | ICD-10-CM

## 2016-05-23 DIAGNOSIS — Z5189 Encounter for other specified aftercare: Secondary | ICD-10-CM | POA: Diagnosis not present

## 2016-05-23 DIAGNOSIS — Z95828 Presence of other vascular implants and grafts: Secondary | ICD-10-CM

## 2016-05-23 MED ORDER — PEGFILGRASTIM INJECTION 6 MG/0.6ML ~~LOC~~
6.0000 mg | PREFILLED_SYRINGE | Freq: Once | SUBCUTANEOUS | Status: AC
  Administered 2016-05-23: 6 mg via SUBCUTANEOUS
  Filled 2016-05-23: qty 0.6

## 2016-05-25 ENCOUNTER — Telehealth: Payer: Self-pay | Admitting: *Deleted

## 2016-05-25 ENCOUNTER — Other Ambulatory Visit: Payer: Self-pay | Admitting: *Deleted

## 2016-05-25 DIAGNOSIS — C8331 Diffuse large B-cell lymphoma, lymph nodes of head, face, and neck: Secondary | ICD-10-CM

## 2016-05-25 NOTE — Telephone Encounter (Signed)
Call placed to patient to inform him per order of Dr. [name] that he has reviewed PET scans and lymphoma is very much better, that PET is almost normal, there is PET activity to left thigh and Dr. [name] would like for pt to have a MRI of his left thigh to assess site.  Patient appreciative of call and information and has no questions at this time.

## 2016-05-29 ENCOUNTER — Ambulatory Visit (HOSPITAL_BASED_OUTPATIENT_CLINIC_OR_DEPARTMENT_OTHER): Payer: 59 | Admitting: Nurse Practitioner

## 2016-05-29 ENCOUNTER — Telehealth: Payer: Self-pay | Admitting: Nurse Practitioner

## 2016-05-29 ENCOUNTER — Other Ambulatory Visit (HOSPITAL_BASED_OUTPATIENT_CLINIC_OR_DEPARTMENT_OTHER): Payer: 59

## 2016-05-29 VITALS — BP 110/77 | HR 64 | Temp 99.0°F | Resp 18 | Ht 68.0 in | Wt 190.4 lb

## 2016-05-29 DIAGNOSIS — C8331 Diffuse large B-cell lymphoma, lymph nodes of head, face, and neck: Secondary | ICD-10-CM

## 2016-05-29 DIAGNOSIS — C866 Primary cutaneous CD30-positive T-cell proliferations: Secondary | ICD-10-CM

## 2016-05-29 DIAGNOSIS — C8590 Non-Hodgkin lymphoma, unspecified, unspecified site: Secondary | ICD-10-CM

## 2016-05-29 LAB — CBC WITH DIFFERENTIAL/PLATELET
HEMATOCRIT: 36.6 % — AB (ref 38.4–49.9)
HEMOGLOBIN: 12.3 g/dL — AB (ref 13.0–17.1)
MCH: 29.7 pg (ref 27.2–33.4)
MCHC: 33.6 g/dL (ref 32.0–36.0)
MCV: 88.3 fL (ref 79.3–98.0)
PLATELETS: 105 10*3/uL — AB (ref 140–400)
RBC: 4.15 10*6/uL — ABNORMAL LOW (ref 4.20–5.82)
RDW: 17.3 % — ABNORMAL HIGH (ref 11.0–14.6)
WBC: 1.8 10*3/uL — ABNORMAL LOW (ref 4.0–10.3)

## 2016-05-29 LAB — MANUAL DIFFERENTIAL
ALC: 0.6 10*3/uL — AB (ref 0.9–3.3)
ANC (CHCC MAN DIFF): 0.6 10*3/uL — AB (ref 1.5–6.5)
BLASTS: 1 % — AB (ref 0–0)
Band Neutrophils: 6 % (ref 0–10)
Basophil: 4 % — ABNORMAL HIGH (ref 0–2)
EOS%: 6 % (ref 0–7)
LYMPH: 32 % (ref 14–49)
MONO: 22 % — AB (ref 0–14)
Metamyelocytes: 1 % — ABNORMAL HIGH (ref 0–0)
Myelocytes: 0 % (ref 0–0)
OTHER CELL: 0 % (ref 0–0)
PLT EST: DECREASED
PROMYELO: 1 % — ABNORMAL HIGH (ref 0–0)
SEG: 27 % — AB (ref 38–77)
VARIANT LYMPH: 0 % (ref 0–0)
nRBC: 1 % — ABNORMAL HIGH (ref 0–0)

## 2016-05-29 NOTE — Progress Notes (Addendum)
[name] OFFICE PROGRESS NOTE   Diagnosis:  Non-Hodgkin's lymphoma  INTERVAL HISTORY:   [name] returns as scheduled. He completed cycle 3 EPOCH beginning 05/18/2016. He denies nausea/vomiting. No mouth sores. No diarrhea. No rash. He overall is feeling well. No fevers or sweats. He denies bleeding. He has missed a few doses of Xarelto.   Objective:  Vital signs in last 24 hours:  Blood pressure 110/77, pulse 64, temperature 99 F (37.2 C), temperature source Oral, resp. rate 18, height '5\' 8"'$  (1.727 m), weight 190 lb 6.4 oz (86.4 kg), SpO2 100 %.    HEENT: No thrush or ulcers. Lymphatics: Small left low neck/scalene lymph node. No other palpable cervical, supra clavicular, axillary or inguinal lymph nodes. Resp: Lungs clear bilaterally. Cardio: Regular rate and rhythm. GI: Abdomen soft and nontender. No organomegaly. Vascular: No leg edema. The left leg appear slightly larger than the right leg. Neuro: Alert and oriented.  Skin: Left posterior leg scar without nodularity.  Port-A-Cath without erythema.  Lab Results:  Lab Results  Component Value Date   WBC 1.8 (L) 05/29/2016   HGB 12.3 (L) 05/29/2016   HCT 36.6 (L) 05/29/2016   MCV 88.3 05/29/2016   PLT 105 (L) 05/29/2016   NEUTROABS 5.3 05/18/2016     Medications: I have reviewed the patient's current medications.  Assessment/Plan: 1. 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  PET scan 03/20/2016 with extensive bulky hypermetabolic nodal activity in the neck, chest, abdomen and pelvis; mild splenomegaly  with diffusely mildly accentuated splenic activity  Staging bone marrow biopsy 03/23/2016-negative for involvement with lymphoma  Cycle 1 EPOCH beginning 03/23/2016  Cycle 2 [facility] 04/13/2016  Restaging PET scan at [facility] 05/08/2016-lymph nodes with various degrees of FDG activity in the neck, axilla, right written him, mesentery, pelvis, and groin. Indeterminate liver lesions without FDG activity, nodular mass in the posterior medial aspect of the left thigh  Cycle 3 [facility] 05/18/2016 2. 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 He entered clinical remission following chemotherapy and remained in remission when he was seen at the cancer center 02/24/2009. 3. 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 Staging PET scan 08/23/2010 with increased FDG activity at the left tonsillar fossa and no additional evidence of lymphoma. He completed 4 cycles of R-ICE with cycle #1 beginning on 09/19/2010 and cycle #4 on 11/21/2010. Repeat head and neck examination by Dr. Constance Lee following R-ICE/rituximab showed no residual lymphoma. He began radiation consolidation on 12/18/2010, radiation was completed on 01/22/2011. 4. History of neutropenia secondary to rituximab. 5. Anemia secondary to chemotherapy, status post a red blood cell transfusion 11/30/2010. The hemoglobin has normalized. 6. History of mild thrombocytopenia secondary to chemotherapy. 7. Hypertension. 8. 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 05/08/2017-nodular mass in the left thigh adjacent to the femur 9. Port-A-Cath placement 03/21/2016 10. Superficial thrombus left greater saphenous vein 04/13/2016. Lovenox initiated, converted to  Xarelto beginning 04/18/2016  Disposition: [name] appears stable. He completed cycle 3 EPOCH beginning 05/18/2016. CBC from today is not yet available. We will contact him with those results. We reviewed neutropenic precautions. He understands to contact the office with fever, chills, other signs of infection.  He has been referred for an MRI of the left thigh to further evaluate the nodular mass identified on the recent PET scan.  He has seen Dr. Minna Lee at [facility] and is currently awaiting treatment recommendations.  He will return for a follow-up visit on 06/08/2016. He will contact the office in the interim as outlined above or with any other problems.  Patient seen with Dr. Benay Lee.    [name], [name] ANP/GNP-BC   05/29/2016  4:02 PM  This was a shared visit with [name]. Her when appears stable. He is being scheduled for an MRI to evaluate the area of hypermetabolism in the left thigh. We will arrange for a biopsy as indicated. He understands this could represent an area of postoperative change, recurrent schwannoma, or lymphoma.  I reviewed the [facility] PET images. There has been marked improvement in the hypermetabolic lymphadenopathy compared to the pre-chemotherapy therapy PET.  We are waiting on a final recommendation from Dr. Minna Lee for additional treatment of the [name].  [name] will return for an office visit next week.  [name], M.D.

## 2016-05-29 NOTE — Telephone Encounter (Signed)
Gave patient AVS and calender per 5/8 los. - scheduled appt for 5/18 f/u with labs

## 2016-05-30 ENCOUNTER — Telehealth: Payer: Self-pay | Admitting: *Deleted

## 2016-05-30 NOTE — Telephone Encounter (Signed)
-----   Message from [name], NP sent at 05/29/2016 11:59 AM EDT ----- Please let him know labs from today confirm neutropenia. I reviewed neutropenic precautions with him at today's visit.

## 2016-05-30 NOTE — Telephone Encounter (Signed)
Left message on voicemail informing pt of ANC 0.6. Left instructions to call office for any signs of infection.

## 2016-06-02 ENCOUNTER — Ambulatory Visit (HOSPITAL_COMMUNITY): Payer: 59

## 2016-06-06 ENCOUNTER — Ambulatory Visit (HOSPITAL_COMMUNITY)
Admission: RE | Admit: 2016-06-06 | Discharge: 2016-06-06 | Disposition: A | Discharge status: Home / Self Care | Payer: 59 | Source: Ambulatory Visit | Attending: Oncology | Admitting: Oncology

## 2016-06-06 DIAGNOSIS — C8331 Diffuse large B-cell lymphoma, lymph nodes of head, face, and neck: Secondary | ICD-10-CM | POA: Insufficient documentation

## 2016-06-06 DIAGNOSIS — R609 Edema, unspecified: Secondary | ICD-10-CM | POA: Insufficient documentation

## 2016-06-06 MED ORDER — HEPARIN SOD (PORK) LOCK FLUSH 100 UNIT/ML IV SOLN
500.0000 [IU] | INTRAVENOUS | Status: AC | PRN
  Administered 2016-06-06: 500 [IU]

## 2016-06-06 MED ORDER — GADOBENATE DIMEGLUMINE 529 MG/ML IV SOLN
20.0000 mL | Freq: Once | INTRAVENOUS | Status: AC | PRN
  Administered 2016-06-06: 20 mL via INTRAVENOUS

## 2016-06-08 ENCOUNTER — Ambulatory Visit (HOSPITAL_BASED_OUTPATIENT_CLINIC_OR_DEPARTMENT_OTHER): Payer: 59 | Admitting: Oncology

## 2016-06-08 ENCOUNTER — Other Ambulatory Visit (HOSPITAL_BASED_OUTPATIENT_CLINIC_OR_DEPARTMENT_OTHER): Payer: 59

## 2016-06-08 ENCOUNTER — Telehealth: Payer: Self-pay | Admitting: Oncology

## 2016-06-08 ENCOUNTER — Telehealth: Payer: Self-pay | Admitting: *Deleted

## 2016-06-08 VITALS — BP 128/79 | HR 62 | Temp 98.3°F | Resp 18 | Ht 68.0 in | Wt 191.2 lb

## 2016-06-08 DIAGNOSIS — C8590 Non-Hodgkin lymphoma, unspecified, unspecified site: Secondary | ICD-10-CM

## 2016-06-08 DIAGNOSIS — C866 Primary cutaneous CD30-positive T-cell proliferations: Secondary | ICD-10-CM

## 2016-06-08 DIAGNOSIS — Z86718 Personal history of other venous thrombosis and embolism: Secondary | ICD-10-CM | POA: Diagnosis not present

## 2016-06-08 DIAGNOSIS — Z7901 Long term (current) use of anticoagulants: Secondary | ICD-10-CM | POA: Diagnosis not present

## 2016-06-08 LAB — CBC WITH DIFFERENTIAL/PLATELET
BASO%: 0.3 % (ref 0.0–2.0)
Basophils Absolute: 0 10*3/uL (ref 0.0–0.1)
EOS%: 0.3 % (ref 0.0–7.0)
Eosinophils Absolute: 0 10*3/uL (ref 0.0–0.5)
HEMATOCRIT: 37.8 % — AB (ref 38.4–49.9)
HGB: 12.7 g/dL — ABNORMAL LOW (ref 13.0–17.1)
LYMPH#: 0.6 10*3/uL — AB (ref 0.9–3.3)
LYMPH%: 6.2 % — AB (ref 14.0–49.0)
MCH: 30.4 pg (ref 27.2–33.4)
MCHC: 33.6 g/dL (ref 32.0–36.0)
MCV: 90.4 fL (ref 79.3–98.0)
MONO#: 0.6 10*3/uL (ref 0.1–0.9)
MONO%: 7.1 % (ref 0.0–14.0)
NEUT%: 86.1 % — ABNORMAL HIGH (ref 39.0–75.0)
NEUTROS ABS: 7.7 10*3/uL — AB (ref 1.5–6.5)
Platelets: 212 10*3/uL (ref 140–400)
RBC: 4.18 10*6/uL — ABNORMAL LOW (ref 4.20–5.82)
RDW: 16.5 % — ABNORMAL HIGH (ref 11.0–14.6)
WBC: 9 10*3/uL (ref 4.0–10.3)
nRBC: 0 % (ref 0–0)

## 2016-06-08 NOTE — Telephone Encounter (Signed)
Called pt's wife: Dr. [name] reviewed case with radiologist. Ultrasound is recommended to be sure they can get to the area for biopsy. Wife requests we schedule biopsy for 5/22, they planned to go see son while he is in  this weekend. Dr. [name] aware, [place] to wait until 5/22 for ultrasound. Take last dose of Xarelto on Sunday.

## 2016-06-08 NOTE — Telephone Encounter (Signed)
Patient scheduled for 05/31 @ 9:15 with Lattie Haw, per 06/08/16 los. Patient was given a copy of the AVS report and appointment schedule, per 06/08/16 los.

## 2016-06-08 NOTE — Progress Notes (Signed)
[facility] OFFICE PROGRESS NOTE   Diagnosis: Non-Hodgkin's lymphoma  INTERVAL HISTORY:   [name] returns as scheduled. He feels well. No fever or sweats. No palpable lymph nodes. He reports mild discomfort at the medial right thigh while traveling to [place] last week. He continues Xarelto anticoagulation. No bleeding. He saw Dr. Minna Antis last week. Dr. Minna Antis recommends non-transplant therapy.  Objective:  Vital signs in last 24 hours:  Blood pressure 128/79, pulse 62, temperature 98.3 F (36.8 C), temperature source Oral, resp. rate 18, height '5\' 8"'$  (1.727 m), weight 191 lb 3.2 oz (86.7 kg), SpO2 99 %.    HEENT: No thrush or ulcers Lymphatics: No cervical, supraclavicular, or inguinal nodes. One half-1 cm mobile bilateral axillary nodes Resp: Lungs clear bilaterally Cardio: Regular rate and rhythm GI: No hepatosplenomegaly, nontender Vascular: No leg edema, examination of the right thigh is unremarkable   Portacath/PICC-without erythema  Lab Results:  Lab Results  Component Value Date   WBC 9.0 06/08/2016   HGB 12.7 (L) 06/08/2016   HCT 37.8 (L) 06/08/2016   MCV 90.4 06/08/2016   PLT 212 06/08/2016   NEUTROABS 7.7 (H) 06/08/2016    CMP     Component Value Date/Time   NA 141 05/18/2016 1250   NA 140 04/30/2016 1234   K 3.7 05/18/2016 1250   K 4.0 04/30/2016 1234   CL 106 05/18/2016 1250   CO2 27 05/18/2016 1250   CO2 24 04/30/2016 1234   GLUCOSE 86 05/18/2016 1250   GLUCOSE 130 04/30/2016 1234   BUN 15 05/18/2016 1250   BUN 11.3 04/30/2016 1234   CREATININE 0.80 05/18/2016 1250   CREATININE 0.9 04/30/2016 1234   CALCIUM 9.0 05/18/2016 1250   CALCIUM 9.1 04/30/2016 1234   PROT 6.1 (L) 05/18/2016 1250   PROT 6.3 (L) 04/30/2016 1234   ALBUMIN 3.7 05/18/2016 1250   ALBUMIN 3.8 04/30/2016 1234   AST 26 05/18/2016 1250   AST 18 04/30/2016 1234   ALT 24 05/18/2016 1250   ALT 26 04/30/2016 1234   ALKPHOS 24 (L) 05/18/2016 1250   ALKPHOS 49 04/30/2016 1234   BILITOT 0.7 05/18/2016 1250   BILITOT 0.66 04/30/2016 1234   GFRNONAA >60 05/18/2016 1250   GFRAA >60 05/18/2016 1250    No results found for: CEA1  Lab Results  Component Value Date   INR 1.10 03/21/2016    Imaging:  Mr Femur Left W Wo Contrast  Result Date: 06/07/2016 CLINICAL DATA:  Diffuse large B-cell lymphoma. History of schwannoma/ hypermetabolic lesion on PET-CT EXAM: MR OF THE LEFT LOWER EXTREMITY WITHOUT AND WITH CONTRAST TECHNIQUE: Multiplanar, multisequence MR imaging of the left femur was performed both before and after administration of intravenous contrast. CONTRAST:  65m MULTIHANCE GADOBENATE DIMEGLUMINE 529 MG/ML IV SOLN COMPARISON:  Multiple exams, including 10/13/2012 and PET-CT from 05/08/2016 FINDINGS: Bones/Joint/Cartilage Today's scan extends from the acetabulum through the distal femoral metaphysis. No significant abnormal bony signal or hip effusion. Ligaments The larger ligaments around the hip appear unremarkable. Muscles and Tendons On the prior exam from 10/13/2012, there was an oval-shaped sharply defined mass along the sciatic nerve with a split fat sign characteristic of schwannoma. That lesion is currently absent, although there is some low T1 and low T2 signal in the sciatic nerve at the site previously occupied by the tumor, as on image 34/6. Moreover, there is low-grade activity in the left sciatic nerve in this vicinity on the recent PET[facility]CT [facility] 05/08/2016. Was this  tumor resected? New compared to the 10/13/2012 MRI exam, there is abnormal infiltrative hypermetabolic activity within the distal most left adductor magnus muscle, measuring 5.2 by 3.0 by 4.8 cm. This corresponds to the main hypermetabolic activity seen on the recent PET-CT exam. The very top of this process was also referenced in my report from prior PET-CT 03/20/2016. There is potentially some low-level edema in the adjacent short head of the  left biceps femoris proximally, but without observed enhancement. Soft tissues Subtle subcutaneous edema anteriorly in the left thigh. Left inguinal lymph node 1.2 cm in short axis on image 16/12. IMPRESSION: 1. Infiltrative masslike process in the distal adductor magnus muscle corresponding to the dominant accentuated signal on recent PET-CT of 05/08/2016, appearance favoring muscular involvement by lymphoma in this context. There is some adjacent low-level edema in the short head of the left biceps femoris muscle without enhancement of the biceps femoris. 2. At the site of the previous 4.9 cm mass along the left sciatic nerve shown on the MRI from 10/13/2012, there is only now some slight expansion of the sciatic nerve with low T1 and low T2 signal, and vague metabolic activity on prior PET-CT. This may represent a postoperative or post therapy related finding. The imaging characteristics of that mass previously favored nerve sheath tumor such as schwannoma. Correlate with history of therapy or prior biopsy/resection. The previous mass within or along the left sciatic nerve, and the current process in the distal adductor magnus are in different locations and quite likely different entities. 3. Borderline prominent left inguinal lymph nodes with a small amount surrounding stranding in the left upper thigh anteriorly. Electronically Signed   By: Van Clines M.D.   On: 06/07/2016 08:23    Medications: I have reviewed the patient's current medications.  Assessment/Plan: 1. 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  PET scan 03/20/2016 with extensive bulky hypermetabolic nodal activity in the neck, chest, abdomen and pelvis; mild splenomegaly with diffusely mildly accentuated splenic activity  Staging bone marrow biopsy 03/23/2016-negative for involvement with lymphoma  Cycle 1 EPOCH beginning 03/23/2016  Cycle 2 [facility] 04/13/2016  Restaging PET scan at [facility] 05/08/2016-lymph nodes with various degrees of FDG activity in the neck, axilla, right written him, mesentery, pelvis, and groin. Indeterminate liver lesions without FDG activity, nodular mass in the posterior medial aspect of the left thigh  Cycle 3 [facility] 05/18/2016 2. 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 He entered clinical remission following chemotherapy and remained in remission when he was seen at the cancer center 02/24/2009. 3. 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 Staging PET scan 08/23/2010 with increased FDG activity at the left tonsillar fossa and no additional evidence of lymphoma. He completed 4 cycles of R-ICE with cycle #1 beginning on 09/19/2010 and cycle #4 on 11/21/2010. Repeat head and neck examination by Dr. Constance Holster following R-ICE/rituximab showed no residual lymphoma. He began radiation consolidation on 12/18/2010, radiation was completed on 01/22/2011. 4. History of neutropenia secondary to rituximab. 5.  Anemia secondary to chemotherapy, status post a red blood cell transfusion 11/30/2010. The hemoglobin has normalized. 6. History of mild thrombocytopenia secondary to chemotherapy. 7. Hypertension. 8. 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 05/08/2017-nodular mass in the left thigh adjacent to the  femur  MRI 06/07/2016-infiltrative mass in the left thigh adductor muscle, separate from the schwannoma excision site 9. Port-A-Cath placement 03/21/2016 10. Superficial thrombus left greater saphenous vein 04/13/2016. Lovenox initiated, converted to Xarelto beginning 04/18/2016    Disposition:  [name] has completed 3 cycles of EPOCH. There has been marked clinical and radiologic improvement. There is a hypermetabolic masslike area in the left thigh that appears separate from the previous schwannoma resection site. I reviewed the MRI images with [name] and his wife. He will be referred for a biopsy of the left thigh mass prior to deciding on further therapy.  I discussed the case with Dr. Minna Antis last week. He recommends treatment with additional chemotherapy and brentuximab as opposed to transplant therapy. He has discussed these recommendations with [name]. We will decide on another cycle of EPOCH 5 versus CHOP based on the 5 mass biopsy.  [name] will return for an office visit on 06/14/2016.  30 minutes were spent with the patient today. The majority of the time was used for counseling and coordination of care.  [name], MD  06/08/2016  4:04 PM

## 2016-06-11 NOTE — Telephone Encounter (Signed)
Called central scheduling, requested they contact pt for ultrasound 5/22.

## 2016-06-12 ENCOUNTER — Telehealth: Payer: Self-pay | Admitting: *Deleted

## 2016-06-12 ENCOUNTER — Other Ambulatory Visit: Payer: Self-pay | Admitting: *Deleted

## 2016-06-12 DIAGNOSIS — C8331 Diffuse large B-cell lymphoma, lymph nodes of head, face, and neck: Secondary | ICD-10-CM

## 2016-06-12 NOTE — Telephone Encounter (Signed)
Message received from patient's wife asking when biopsy is to be scheduled and to confirm appts with L. Marcello Moores NP.  Central scheduling notified of need for biopsy appt. Call placed back to pt.'s wife to inform her that appt with L. Thomas NP would be scheduled after biopsy appt.  Pt.'s wife appreciative of call back and has no questions at this time.

## 2016-06-13 ENCOUNTER — Telehealth: Payer: Self-pay | Admitting: *Deleted

## 2016-06-13 ENCOUNTER — Other Ambulatory Visit: Payer: Self-pay | Admitting: Radiology

## 2016-06-13 ENCOUNTER — Ambulatory Visit (HOSPITAL_COMMUNITY)
Admission: RE | Admit: 2016-06-13 | Discharge: 2016-06-13 | Disposition: A | Discharge status: Home / Self Care | Payer: 59 | Source: Ambulatory Visit | Attending: Oncology | Admitting: Oncology

## 2016-06-13 DIAGNOSIS — C8590 Non-Hodgkin lymphoma, unspecified, unspecified site: Secondary | ICD-10-CM | POA: Diagnosis not present

## 2016-06-13 NOTE — Telephone Encounter (Signed)
Call placed to patient's wife to inform her that Dr. [name] would like for pt to keep his scheduled appt on 06/21/16 to discuss biopsy results.  Patient's wife appreciative of call and has no further questions at this time.

## 2016-06-13 NOTE — Telephone Encounter (Signed)
Call placed to patient's wife to inform her to have patient hold Xarelto for possible biopsy tomorrow.  Patient's wife states that pt has held [place] since Sunday.  Informed wife that I would notify her when biopsy has been scheduled.

## 2016-06-14 ENCOUNTER — Other Ambulatory Visit: Payer: Self-pay | Admitting: General Surgery

## 2016-06-14 ENCOUNTER — Other Ambulatory Visit: Payer: Self-pay | Admitting: Student

## 2016-06-15 ENCOUNTER — Other Ambulatory Visit: Payer: Self-pay | Admitting: Radiology

## 2016-06-15 ENCOUNTER — Encounter (HOSPITAL_COMMUNITY): Payer: Self-pay

## 2016-06-15 ENCOUNTER — Ambulatory Visit (HOSPITAL_COMMUNITY)
Admission: RE | Admit: 2016-06-15 | Discharge: 2016-06-15 | Disposition: A | Discharge status: Home / Self Care | Payer: 59 | Source: Ambulatory Visit | Attending: Oncology | Admitting: Oncology

## 2016-06-19 ENCOUNTER — Telehealth: Payer: Self-pay | Admitting: *Deleted

## 2016-06-19 NOTE — Telephone Encounter (Signed)
Call from pt's wife, chemo appt is scheduled for 1:15. "Can we move closer to office visit?" Informed her we can move office visit closer to infusion.  Pt reports phlebotomies have been difficult. He agrees to fingerstick for CBC. Will draw CMET during office visit. (No availability in flush rooms.)

## 2016-06-20 ENCOUNTER — Other Ambulatory Visit: Payer: Self-pay | Admitting: Oncology

## 2016-06-20 NOTE — Progress Notes (Signed)
DISCONTINUE OFF PATHWAY REGIMEN - Lymphoma and CLL   [id number]:EPOCH q21 days:   A cycle is every 21 days:     Etoposide        Dose Mod: None     Doxorubicin        Dose Mod: None     Vincristine        Dose Mod: None     Cyclophosphamide        Dose Mod: None     Prednisone        Dose Mod: None     Pegfilgrastim   **Always confirm dose/schedule in your pharmacy ordering system**    REASON: Continuation Of Treatment PRIOR TREATMENT: Off Pathway: EPOCH q21 days TREATMENT RESPONSE: Partial Response (PR)  START OFF PATHWAY REGIMEN - Lymphoma and CLL   [id number]:CHOP q21 days:   A cycle is every 21 days:     Cyclophosphamide      Doxorubicin      Vincristine      Prednisone   **Always confirm dose/schedule in your pharmacy ordering system**    Patient Characteristics: T-Cell Lymphoma, First Line, First Line + Consolidation, Peripheral T-Cell Lymphoma, AITL, 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

## 2016-06-21 ENCOUNTER — Telehealth: Payer: Self-pay | Admitting: Oncology

## 2016-06-21 ENCOUNTER — Ambulatory Visit (HOSPITAL_BASED_OUTPATIENT_CLINIC_OR_DEPARTMENT_OTHER): Payer: 59

## 2016-06-21 ENCOUNTER — Ambulatory Visit (HOSPITAL_BASED_OUTPATIENT_CLINIC_OR_DEPARTMENT_OTHER): Payer: 59 | Admitting: Nurse Practitioner

## 2016-06-21 ENCOUNTER — Other Ambulatory Visit: Payer: Self-pay

## 2016-06-21 ENCOUNTER — Other Ambulatory Visit (HOSPITAL_BASED_OUTPATIENT_CLINIC_OR_DEPARTMENT_OTHER): Payer: 59

## 2016-06-21 VITALS — BP 135/91 | HR 53 | Temp 98.5°F | Resp 18 | Wt 195.5 lb

## 2016-06-21 DIAGNOSIS — C866 Primary cutaneous CD30-positive T-cell proliferations: Secondary | ICD-10-CM

## 2016-06-21 DIAGNOSIS — C8590 Non-Hodgkin lymphoma, unspecified, unspecified site: Secondary | ICD-10-CM

## 2016-06-21 DIAGNOSIS — Z7901 Long term (current) use of anticoagulants: Secondary | ICD-10-CM | POA: Diagnosis not present

## 2016-06-21 DIAGNOSIS — Z95828 Presence of other vascular implants and grafts: Secondary | ICD-10-CM

## 2016-06-21 DIAGNOSIS — Z86718 Personal history of other venous thrombosis and embolism: Secondary | ICD-10-CM

## 2016-06-21 DIAGNOSIS — C8331 Diffuse large B-cell lymphoma, lymph nodes of head, face, and neck: Secondary | ICD-10-CM

## 2016-06-21 DIAGNOSIS — Z5111 Encounter for antineoplastic chemotherapy: Secondary | ICD-10-CM

## 2016-06-21 LAB — CBC WITH DIFFERENTIAL/PLATELET
BASO%: 1.4 % (ref 0.0–2.0)
Basophils Absolute: 0.1 10*3/uL (ref 0.0–0.1)
EOS ABS: 0.3 10*3/uL (ref 0.0–0.5)
EOS%: 4.4 % (ref 0.0–7.0)
HEMATOCRIT: 39 % (ref 38.4–49.9)
HEMOGLOBIN: 13 g/dL (ref 13.0–17.1)
LYMPH#: 1.1 10*3/uL (ref 0.9–3.3)
LYMPH%: 16.7 % (ref 14.0–49.0)
MCH: 30.4 pg (ref 27.2–33.4)
MCHC: 33.3 g/dL (ref 32.0–36.0)
MCV: 91.1 fL (ref 79.3–98.0)
MONO#: 0.4 10*3/uL (ref 0.1–0.9)
MONO%: 5.5 % (ref 0.0–14.0)
NEUT%: 72 % (ref 39.0–75.0)
NEUTROS ABS: 4.6 10*3/uL (ref 1.5–6.5)
PLATELETS: 265 10*3/uL (ref 140–400)
RBC: 4.28 10*6/uL (ref 4.20–5.82)
RDW: 15.6 % — AB (ref 11.0–14.6)
WBC: 6.3 10*3/uL (ref 4.0–10.3)
nRBC: 0 % (ref 0–0)

## 2016-06-21 LAB — COMPREHENSIVE METABOLIC PANEL
ALBUMIN: 4 g/dL (ref 3.5–5.0)
ALT: 23 U/L (ref 0–55)
AST: 18 U/L (ref 5–34)
Alkaline Phosphatase: 29 U/L — ABNORMAL LOW (ref 40–150)
Anion Gap: 10 mEq/L (ref 3–11)
BUN: 13 mg/dL (ref 7.0–26.0)
CO2: 27 mEq/L (ref 22–29)
Calcium: 9.4 mg/dL (ref 8.4–10.4)
Chloride: 105 mEq/L (ref 98–109)
Creatinine: 0.8 mg/dL (ref 0.7–1.3)
Glucose: 83 mg/dl (ref 70–140)
POTASSIUM: 4 meq/L (ref 3.5–5.1)
Sodium: 142 mEq/L (ref 136–145)
Total Bilirubin: 0.91 mg/dL (ref 0.20–1.20)
Total Protein: 6.3 g/dL — ABNORMAL LOW (ref 6.4–8.3)

## 2016-06-21 MED ORDER — SODIUM CHLORIDE 0.9% FLUSH
10.0000 mL | INTRAVENOUS | Status: DC | PRN
  Filled 2016-06-21: qty 10

## 2016-06-21 MED ORDER — HEPARIN SOD (PORK) LOCK FLUSH 100 UNIT/ML IV SOLN
500.0000 [IU] | Freq: Once | INTRAVENOUS | Status: DC | PRN
  Filled 2016-06-21: qty 5

## 2016-06-21 MED ORDER — PALONOSETRON HCL INJECTION 0.25 MG/5ML
0.2500 mg | Freq: Once | INTRAVENOUS | Status: AC
  Administered 2016-06-21: 0.25 mg via INTRAVENOUS

## 2016-06-21 MED ORDER — PALONOSETRON HCL INJECTION 0.25 MG/5ML
INTRAVENOUS | Status: AC
  Filled 2016-06-21: qty 5

## 2016-06-21 MED ORDER — SODIUM CHLORIDE 0.9% FLUSH
10.0000 mL | INTRAVENOUS | Status: DC | PRN
  Administered 2016-06-21 (×2): 10 mL via INTRAVENOUS
  Filled 2016-06-21: qty 10

## 2016-06-21 MED ORDER — SODIUM CHLORIDE 0.9 % IV SOLN
Freq: Once | INTRAVENOUS | Status: AC
  Administered 2016-06-21: 13:00:00 via INTRAVENOUS

## 2016-06-21 MED ORDER — HEPARIN SOD (PORK) LOCK FLUSH 100 UNIT/ML IV SOLN
500.0000 [IU] | Freq: Once | INTRAVENOUS | Status: AC | PRN
Start: 2016-06-21 — End: 2016-06-21
  Administered 2016-06-21: 500 [IU]
  Filled 2016-06-21: qty 5

## 2016-06-21 MED ORDER — DEXAMETHASONE SODIUM PHOSPHATE 10 MG/ML IJ SOLN
10.0000 mg | Freq: Once | INTRAMUSCULAR | Status: AC
  Administered 2016-06-21: 10 mg via INTRAVENOUS

## 2016-06-21 MED ORDER — VINCRISTINE SULFATE CHEMO INJECTION 1 MG/ML
1.0000 mg | Freq: Once | INTRAVENOUS | Status: AC
  Administered 2016-06-21: 1 mg via INTRAVENOUS
  Filled 2016-06-21: qty 1

## 2016-06-21 MED ORDER — DEXAMETHASONE SODIUM PHOSPHATE 10 MG/ML IJ SOLN
INTRAMUSCULAR | Status: AC
  Filled 2016-06-21: qty 1

## 2016-06-21 MED ORDER — PREDNISONE 20 MG PO TABS: 100.0000 mg | ORAL_TABLET | Freq: Every day | ORAL | 2 refills | Status: AC

## 2016-06-21 MED ORDER — CYCLOPHOSPHAMIDE CHEMO INJECTION 1 GM
750.0000 mg/m2 | Freq: Once | INTRAMUSCULAR | Status: AC
  Administered 2016-06-21: 1540 mg via INTRAVENOUS
  Filled 2016-06-21: qty 77

## 2016-06-21 NOTE — Progress Notes (Addendum)
[place] OFFICE PROGRESS NOTE   Diagnosis:  Non-Hodgkin's lymphoma  INTERVAL HISTORY:   [name] returns as scheduled. The thigh mass could not be biopsied. He feels well. No nausea or vomiting. Bowels moving regularly. No fevers or sweats. Stable numbness at the left heel. No numbness or tingling in the fingertips or toes. No thigh pain.  Objective:  Vital signs in last 24 hours:  Blood pressure (!) 135/91, pulse (!) 53, temperature 98.5 F (36.9 C), temperature source Oral, resp. rate 18, weight 195 lb 8 oz (88.7 kg), SpO2 100 %.    HEENT: No thrush or ulcers. Lymphatics: No palpable cervical, supraclavicular, axillary or inguinal lymph nodes. Resp: Lungs clear bilaterally. Cardio: Regular rate and rhythm. GI: Abdomen soft and nontender. No organomegaly. Vascular: No leg edema. Neuro: Vibratory sense intact over the fingertips per tuning fork exam.  Skin: No rash. Port-A-Cath without erythema.    Lab Results:  Lab Results  Component Value Date   WBC 6.3 06/21/2016   HGB 13.0 06/21/2016   HCT 39.0 06/21/2016   MCV 91.1 06/21/2016   PLT 265 06/21/2016   NEUTROABS 4.6 06/21/2016    Imaging:  No results found.  Medications: I have reviewed the patient's current medications.  Assessment/Plan: 1. 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  PET scan 03/20/2016 with extensive bulky hypermetabolic nodal activity in the neck, chest, abdomen and pelvis; mild splenomegaly with diffusely mildly accentuated splenic activity  Staging bone marrow biopsy  03/23/2016-negative for involvement with lymphoma  Cycle 1 EPOCH beginning 03/23/2016  Cycle 2 [facility] 04/13/2016  Restaging PET scan at [facility] 05/08/2016-lymph nodes with various degrees of FDG activity in the neck, axilla, right written him, mesentery, pelvis, and groin. Indeterminate liver lesions without FDG activity, nodular mass in the posterior medial aspect of the left thigh  Cycle 3 EPOCH04/27/2018  Cycle 1 CVP 06/21/2016 2. 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 He entered clinical remission following chemotherapy and remained in remission when he was seen at the cancer center 02/24/2009. 3. 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 Staging PET scan 08/23/2010 with increased FDG activity at the left tonsillar fossa and no additional evidence of lymphoma. He completed 4 cycles of R-ICE with cycle #1 beginning on 09/19/2010 and cycle #4 on 11/21/2010. Repeat head and neck examination by Dr. Constance Holster following R-ICE/rituximab showed no residual lymphoma. He began radiation consolidation on 12/18/2010, radiation was completed on 01/22/2011. 4. History of neutropenia secondary to rituximab. 5. Anemia secondary to chemotherapy, status post a red blood cell transfusion 11/30/2010. The hemoglobin has normalized. 6. History of mild thrombocytopenia secondary to chemotherapy. 7. Hypertension. 8. 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 05/08/2017-nodular mass in the left thigh adjacent to the femur  MRI 06/07/2016-infiltrative mass in the left thigh adductor muscle, separate from the schwannoma excision site 9. Port-A-Cath placement 03/21/2016 10. Superficial thrombus left greater  saphenous vein 04/13/2016. Lovenox initiated, converted  to Xarelto beginning 04/18/2016   Disposition: [name] appears stable. Plan to proceed with cycle 1 CVP today. He will return for a nadir CBC on 07/04/2016. He will return for a follow-up visit and Cytoxan/brentuximab 07/12/2016. He will contact the office in the interim with any problems.  Patient seen with Dr. [name].    [name] ANP/GNP-BC   06/21/2016  11:25 AM  This was a shared visit with [name]. I discussed the case with Dr. Minna Antis last week. We discussed the thigh imaging findings. The thyroid lesion cannot be visualized by ultrasound or CT and therefore there is no option for an image guided biopsy. We decided to follow this lesion for now.  The plan is to proceed with a low of CVP today followed by 2 cycles of Cytoxan/prednisone with brentuximab. This will be followed by single agent brentuximab every 3 weeks. I discussed the plan with [name] and his wife. He is in agreement to proceed.  Julieanne Manson, M.D.

## 2016-06-21 NOTE — Progress Notes (Signed)
Cytoxan given over 1 hour per patient request.

## 2016-06-21 NOTE — Patient Instructions (Signed)
[place] Discharge Instructions for Patients Receiving Chemotherapy  Today you received the following chemotherapy agents: Vincristine and Cytoxan.  To help prevent nausea and vomiting after your treatment, we encourage you to take your nausea medication Compazine. May take Zofran in 72 hours from treatment today.   If you develop nausea and vomiting that is not controlled by your nausea medication, call the clinic.   BELOW ARE SYMPTOMS THAT SHOULD BE REPORTED IMMEDIATELY:  *FEVER GREATER THAN 100.5 F  *CHILLS WITH OR WITHOUT FEVER  NAUSEA AND VOMITING THAT IS NOT CONTROLLED WITH YOUR NAUSEA MEDICATION  *UNUSUAL SHORTNESS OF BREATH  *UNUSUAL BRUISING OR BLEEDING  TENDERNESS IN MOUTH AND THROAT WITH OR WITHOUT PRESENCE OF ULCERS  *URINARY PROBLEMS  *BOWEL PROBLEMS  UNUSUAL RASH Items with * indicate a potential emergency and should be followed up as soon as possible.  Feel free to call the clinic you have any questions or concerns. The clinic phone number is [phone].  Please show the [facility] at check-in to the Emergency Department and triage nurse.  Cyclophosphamide injection What is this medicine? CYCLOPHOSPHAMIDE (sye kloe FOSS fa mide) is a chemotherapy drug. It slows the growth of cancer cells. This medicine is used to treat many types of cancer like lymphoma, myeloma, leukemia, breast cancer, and ovarian cancer, to name a few. This medicine may be used for other purposes; ask your health care provider or pharmacist if you have questions. COMMON BRAND NAME(S): Cytoxan, Neosar What should I tell my health care provider before I take this medicine? They need to know if you have any of these conditions: -blood disorders -history of other chemotherapy -infection -kidney disease -liver disease -recent or ongoing radiation therapy -tumors in the bone marrow -an unusual or allergic reaction to cyclophosphamide, other chemotherapy, other  medicines, foods, dyes, or preservatives -pregnant or trying to get pregnant -breast-feeding How should I use this medicine? This drug is usually given as an injection into a vein or muscle or by infusion into a vein. It is administered in a hospital or clinic by a specially trained health care professional. Talk to your pediatrician regarding the use of this medicine in children. Special care may be needed. Overdosage: If you think you have taken too much of this medicine contact a poison control center or emergency room at once. NOTE: This medicine is only for you. Do not share this medicine with others. What if I miss a dose? It is important not to miss your dose. Call your doctor or health care professional if you are unable to keep an appointment. What may interact with this medicine? This medicine may interact with the following medications: -amiodarone -amphotericin B -azathioprine -certain antiviral medicines for HIV or AIDS such as protease inhibitors (e.g., indinavir, ritonavir) and zidovudine -certain blood pressure medications such as benazepril, captopril, enalapril, fosinopril, lisinopril, moexipril, monopril, perindopril, quinapril, ramipril, trandolapril -certain cancer medications such as anthracyclines (e.g., daunorubicin, doxorubicin), busulfan, cytarabine, paclitaxel, pentostatin, tamoxifen, trastuzumab -certain diuretics such as chlorothiazide, chlorthalidone, hydrochlorothiazide, indapamide, metolazone -certain medicines that treat or prevent blood clots like warfarin -certain muscle relaxants such as succinylcholine -cyclosporine -etanercept -indomethacin -medicines to increase blood counts like filgrastim, pegfilgrastim, sargramostim -medicines used as general anesthesia -metronidazole -natalizumab This list may not describe all possible interactions. Give your health care provider a list of all the medicines, herbs, non-prescription drugs, or dietary supplements  you use. Also tell them if you smoke, drink alcohol, or use illegal drugs. Some items may interact  with your medicine. What should I watch for while using this medicine? Visit your doctor for checks on your progress. This drug may make you feel generally unwell. This is not uncommon, as chemotherapy can affect healthy cells as well as cancer cells. Report any side effects. Continue your course of treatment even though you feel ill unless your doctor tells you to stop. Drink water or other fluids as directed. Urinate often, even at night. In some cases, you may be given additional medicines to help with side effects. Follow all directions for their use. Call your doctor or health care professional for advice if you get a fever, chills or sore throat, or other symptoms of a cold or flu. Do not treat yourself. This drug decreases your body's ability to fight infections. Try to avoid being around people who are sick. This medicine may increase your risk to bruise or bleed. Call your doctor or health care professional if you notice any unusual bleeding. Be careful brushing and flossing your teeth or using a toothpick because you may get an infection or bleed more easily. If you have any dental work done, tell your dentist you are receiving this medicine. You may get drowsy or dizzy. Do not drive, use machinery, or do anything that needs mental alertness until you know how this medicine affects you. Do not become pregnant while taking this medicine or for 1 year after stopping it. Women should inform their doctor if they wish to become pregnant or think they might be pregnant. Men should not father a child while taking this medicine and for 4 months after stopping it. There is a potential for serious side effects to an unborn child. Talk to your health care professional or pharmacist for more information. Do not breast-feed an infant while taking this medicine. This medicine may interfere with the ability to  have a child. This medicine has caused ovarian failure in some women. This medicine has caused reduced sperm counts in some men. You should talk with your doctor or health care professional if you are concerned about your fertility. If you are going to have surgery, tell your doctor or health care professional that you have taken this medicine. What side effects may I notice from receiving this medicine? Side effects that you should report to your doctor or health care professional as soon as possible: -allergic reactions like skin rash, itching or hives, swelling of the face, lips, or tongue -low blood counts - this medicine may decrease the number of white blood cells, red blood cells and platelets. You may be at increased risk for infections and bleeding. -signs of infection - fever or chills, cough, sore throat, pain or difficulty passing urine -signs of decreased platelets or bleeding - bruising, pinpoint red spots on the skin, black, tarry stools, blood in the urine -signs of decreased red blood cells - unusually weak or tired, fainting spells, lightheadedness -breathing problems -dark urine -dizziness -palpitations -swelling of the ankles, feet, hands -trouble passing urine or change in the amount of urine -weight gain -yellowing of the eyes or skin Side effects that usually do not require medical attention (report to your doctor or health care professional if they continue or are bothersome): -changes in nail or skin color -hair loss -missed menstrual periods -mouth sores -nausea, vomiting This list may not describe all possible side effects. Call your doctor for medical advice about side effects. You may report side effects to FDA at 1-800-FDA-1088. Where should I keep my medicine?  This drug is given in a hospital or clinic and will not be stored at home. NOTE: This sheet is a summary. It may not cover all possible information. If you have questions about this medicine, talk to  your doctor, pharmacist, or health care provider.  2018 Elsevier/Gold Standard (2011-11-23 16:22:58) Vincristine injection What is this medicine? VINCRISTINE (vin KRIS teen) is a chemotherapy drug. It slows the growth of cancer cells. This medicine is used to treat many types of cancer like Hodgkin's disease, leukemia, non-Hodgkin's lymphoma, neuroblastoma (brain cancer), rhabdomyosarcoma, and Wilms' tumor. This medicine may be used for other purposes; ask your health care provider or pharmacist if you have questions. COMMON BRAND NAME(S): Oncovin, Vincasar PFS What should I tell my health care provider before I take this medicine? They need to know if you have any of these conditions: -blood disorders -gout -infection (especially chickenpox, cold sores, or herpes) -kidney disease -liver disease -lung disease -nervous system disease like Charcot-Marie-Tooth (CMT) -recent or ongoing radiation therapy -an unusual or allergic reaction to vincristine, other chemotherapy agents, other medicines, foods, dyes, or preservatives -pregnant or trying to get pregnant -breast-feeding How should I use this medicine? This drug is given as an infusion into a vein. It is administered in a hospital or clinic by a specially trained health care professional. If you have pain, swelling, burning, or any unusual feeling around the site of your injection, tell your health care professional right away. Talk to your pediatrician regarding the use of this medicine in children. While this drug may be prescribed for selected conditions, precautions do apply. Overdosage: If you think you have taken too much of this medicine contact a poison control center or emergency room at once. NOTE: This medicine is only for you. Do not share this medicine with others. What if I miss a dose? It is important not to miss your dose. Call your doctor or health care professional if you are unable to keep an appointment. What may  interact with this medicine? Do not take this medicine with any of the following medications: -itraconazole -mibefradil -voriconazole This medicine may also interact with the following medications: -cyclosporine -erythromycin -fluconazole -ketoconazole -medicines for HIV like delavirdine, efavirenz, nevirapine -medicines for seizures like ethotoin, fosphenotoin, phenytoin -medicines to increase blood counts like filgrastim, pegfilgrastim, sargramostim -other chemotherapy drugs like cisplatin, L-asparaginase, methotrexate, mitomycin, paclitaxel -pegaspargase -vaccines -zalcitabine, ddC Talk to your doctor or health care professional before taking any of these medicines: -acetaminophen -aspirin -ibuprofen -ketoprofen -naproxen This list may not describe all possible interactions. Give your health care provider a list of all the medicines, herbs, non-prescription drugs, or dietary supplements you use. Also tell them if you smoke, drink alcohol, or use illegal drugs. Some items may interact with your medicine. What should I watch for while using this medicine? Your condition will be monitored carefully while you are receiving this medicine. You will need important blood work done while you are taking this medicine. This drug may make you feel generally unwell. This is not uncommon, as chemotherapy can affect healthy cells as well as cancer cells. Report any side effects. Continue your course of treatment even though you feel ill unless your doctor tells you to stop. In some cases, you may be given additional medicines to help with side effects. Follow all directions for their use. Call your doctor or health care professional for advice if you get a fever, chills or sore throat, or other symptoms of a cold or flu. Do not treat  yourself. Avoid taking products that contain aspirin, acetaminophen, ibuprofen, naproxen, or ketoprofen unless instructed by your doctor. These medicines may hide a  fever. Do not become pregnant while taking this medicine. Women should inform their doctor if they wish to become pregnant or think they might be pregnant. There is a potential for serious side effects to an unborn child. Talk to your health care professional or pharmacist for more information. Do not breast-feed an infant while taking this medicine. Men may have a lower sperm count while taking this medicine. Talk to your doctor if you plan to father a child. What side effects may I notice from receiving this medicine? Side effects that you should report to your doctor or health care professional as soon as possible: -allergic reactions like skin rash, itching or hives, swelling of the face, lips, or tongue -breathing problems -confusion or changes in emotions or moods -constipation -cough -mouth sores -muscle weakness -nausea and vomiting -pain, swelling, redness or irritation at the injection site -pain, tingling, numbness in the hands or feet -problems with balance, talking, walking -seizures -stomach pain -trouble passing urine or change in the amount of urine Side effects that usually do not require medical attention (report to your doctor or health care professional if they continue or are bothersome): -diarrhea -hair loss -jaw pain -loss of appetite This list may not describe all possible side effects. Call your doctor for medical advice about side effects. You may report side effects to FDA at 1-800-FDA-1088. Where should I keep my medicine? This drug is given in a hospital or clinic and will not be stored at home. NOTE: This sheet is a summary. It may not cover all possible information. If you have questions about this medicine, talk to your doctor, pharmacist, or health care provider.  2018 Elsevier/Gold Standard (2007-10-06 17:17:13)

## 2016-06-21 NOTE — Telephone Encounter (Signed)
Scheduled appt per 5/31 los. Patient to receive schedule in the treatment area,.

## 2016-06-28 ENCOUNTER — Telehealth: Payer: Self-pay | Admitting: *Deleted

## 2016-06-28 DIAGNOSIS — C8442 Peripheral T-cell lymphoma, not classified, intrathoracic lymph nodes: Secondary | ICD-10-CM

## 2016-06-28 NOTE — Telephone Encounter (Signed)
Will decrease dose with next cycle

## 2016-06-28 NOTE — Telephone Encounter (Signed)
Telephone call to patient- patient needs lab drawn   100mg  prednisone dose is "not fun" patient becomes very agitated, short tempered, memory loss post prednisone dose taken. Patient has to increase lorazepam during those days. This has effected his job.

## 2016-06-28 NOTE — Telephone Encounter (Signed)
Order for labs 06/29/16

## 2016-06-29 ENCOUNTER — Other Ambulatory Visit (HOSPITAL_BASED_OUTPATIENT_CLINIC_OR_DEPARTMENT_OTHER): Payer: 59

## 2016-06-29 DIAGNOSIS — C8442 Peripheral T-cell lymphoma, not classified, intrathoracic lymph nodes: Secondary | ICD-10-CM | POA: Diagnosis not present

## 2016-06-29 LAB — CBC WITH DIFFERENTIAL/PLATELET
BASO%: 0.6 % (ref 0.0–2.0)
Basophils Absolute: 0 10*3/uL (ref 0.0–0.1)
EOS%: 6.7 % (ref 0.0–7.0)
Eosinophils Absolute: 0.4 10*3/uL (ref 0.0–0.5)
HEMATOCRIT: 38.1 % — AB (ref 38.4–49.9)
HGB: 12.9 g/dL — ABNORMAL LOW (ref 13.0–17.1)
LYMPH%: 15.2 % (ref 14.0–49.0)
MCH: 30.6 pg (ref 27.2–33.4)
MCHC: 33.9 g/dL (ref 32.0–36.0)
MCV: 90.3 fL (ref 79.3–98.0)
MONO#: 0.1 10*3/uL (ref 0.1–0.9)
MONO%: 1.7 % (ref 0.0–14.0)
NEUT%: 75.8 % — ABNORMAL HIGH (ref 39.0–75.0)
NEUTROS ABS: 3.9 10*3/uL (ref 1.5–6.5)
Platelets: 217 10*3/uL (ref 140–400)
RBC: 4.22 10*6/uL (ref 4.20–5.82)
RDW: 15 % — ABNORMAL HIGH (ref 11.0–14.6)
WBC: 5.2 10*3/uL (ref 4.0–10.3)
lymph#: 0.8 10*3/uL — ABNORMAL LOW (ref 0.9–3.3)
nRBC: 0 % (ref 0–0)

## 2016-07-04 ENCOUNTER — Other Ambulatory Visit: Payer: Self-pay

## 2016-07-08 ENCOUNTER — Other Ambulatory Visit: Payer: Self-pay | Admitting: Oncology

## 2016-07-09 ENCOUNTER — Other Ambulatory Visit: Payer: Self-pay | Admitting: Oncology

## 2016-07-12 ENCOUNTER — Ambulatory Visit (HOSPITAL_BASED_OUTPATIENT_CLINIC_OR_DEPARTMENT_OTHER): Payer: 59 | Admitting: Oncology

## 2016-07-12 ENCOUNTER — Other Ambulatory Visit: Payer: 59

## 2016-07-12 ENCOUNTER — Ambulatory Visit: Payer: 59

## 2016-07-12 ENCOUNTER — Other Ambulatory Visit: Payer: Self-pay | Admitting: Oncology

## 2016-07-12 ENCOUNTER — Other Ambulatory Visit: Payer: Self-pay | Admitting: *Deleted

## 2016-07-12 VITALS — BP 137/89 | HR 58 | Temp 99.1°F | Resp 18 | Ht 68.0 in | Wt 196.6 lb

## 2016-07-12 DIAGNOSIS — Z7901 Long term (current) use of anticoagulants: Secondary | ICD-10-CM

## 2016-07-12 DIAGNOSIS — C8590 Non-Hodgkin lymphoma, unspecified, unspecified site: Secondary | ICD-10-CM

## 2016-07-12 DIAGNOSIS — C866 Primary cutaneous CD30-positive T-cell proliferations: Secondary | ICD-10-CM

## 2016-07-12 DIAGNOSIS — Z86718 Personal history of other venous thrombosis and embolism: Secondary | ICD-10-CM | POA: Diagnosis not present

## 2016-07-12 MED ORDER — LORAZEPAM 0.5 MG PO TABS: ORAL_TABLET | ORAL | 0 refills | Status: DC

## 2016-07-12 NOTE — Progress Notes (Signed)
[facility] OFFICE PROGRESS NOTE   Diagnosis: T-cell lymphoma  INTERVAL HISTORY:   [name] returns as scheduled. He completed a cycle of CVP on 06/21/2016. He reports tolerating the chemotherapy well. No nausea or peripheral neuropathy symptoms. He feels well. He is working. He became emotional with the high-dose prednisone.  He recently fell while playing tennis and suffered an abrasion at the right arm. This has healed. No bleeding. He continues to have numbness at the left heel  Objective:  Vital signs in last 24 hours:  Blood pressure 137/89, pulse (!) 58, temperature 99.1 F (37.3 C), temperature source Oral, resp. rate 18, height 5\' 8"  (1.727 m), weight 196 lb 9.6 oz (89.2 kg), SpO2 98 %.    HEENT: No thrush or ulcers Lymphatics: No cervical or supra-clavicular nodes Resp: Lungs clear bilaterally Cardio: Regular rate and rhythm GI: No hepatosplenomegaly, no mass, nontender Vascular: No leg edema    Portacath/PICC-without erythema  Lab Results:  Lab Results  Component Value Date   WBC 5.2 06/29/2016   HGB 12.9 (L) 06/29/2016   HCT 38.1 (L) 06/29/2016   MCV 90.3 06/29/2016   PLT 217 06/29/2016   NEUTROABS 3.9 06/29/2016    CMP     Component Value Date/Time   NA 142 06/21/2016 1046   K 4.0 06/21/2016 1046   CL 106 05/18/2016 1250   CO2 27 06/21/2016 1046   GLUCOSE 83 06/21/2016 1046   BUN 13.0 06/21/2016 1046   CREATININE 0.8 06/21/2016 1046   CALCIUM 9.4 06/21/2016 1046   PROT 6.3 (L) 06/21/2016 1046   ALBUMIN 4.0 06/21/2016 1046   AST 18 06/21/2016 1046   ALT 23 06/21/2016 1046   ALKPHOS 29 (L) 06/21/2016 1046   BILITOT 0.91 06/21/2016 1046   GFRNONAA >60 05/18/2016 1250   GFRAA >60 05/18/2016 1250     Medications: I have reviewed the patient's current medications.  Assessment/plan:  1. 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  PET scan 03/20/2016 with extensive bulky hypermetabolic nodal activity in the neck, chest, abdomen and pelvis; mild splenomegaly with diffusely mildly accentuated splenic activity  Staging bone marrow biopsy 03/23/2016-negative for involvement with lymphoma  Cycle 1 EPOCH beginning 03/23/2016  Cycle 2 [facility] 04/13/2016  Restaging PET scan at [facility] 05/08/2016-lymph nodes with various degrees of FDG activity in the neck, axilla, right written him, mesentery, pelvis, and groin. Indeterminate liver lesions without FDG activity, nodular mass in the posterior medial aspect of the left thigh  Cycle 3 EPOCH04/27/2018  Cycle 1 CVP 06/21/2016 2. 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 He entered clinical remission following chemotherapy and remained in remission when he was seen at the cancer center 02/24/2009. 3. 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 Staging PET scan 08/23/2010 with increased FDG activity at the left tonsillar fossa and no additional evidence of lymphoma. He completed 4 cycles of R-ICE with cycle #1 beginning on 09/19/2010 and cycle #4 on 11/21/2010. Repeat head and neck examination by Dr.  Rosen following R-ICE/rituximab showed no residual lymphoma. He began radiation consolidation on 12/18/2010, radiation was completed on 01/22/2011. 4. History of neutropenia secondary to rituximab. 5. Anemia secondary to chemotherapy, status post a red blood cell  transfusion 11/30/2010. The hemoglobin has normalized. 6. History of mild thrombocytopenia secondary to chemotherapy. 7. Hypertension. 8. 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 05/08/2017-nodular mass in the left thigh adjacent to the femur  MRI 06/07/2016-infiltrative mass in the left thigh adductor muscle, separate from the schwannoma excision site 9. Port-A-Cath placement 03/21/2016 10. Superficial thrombus left greater saphenous vein 04/13/2016. Lovenox initiated, converted to Xarelto beginning 04/18/2016  Disposition:  [name] appears stable. He is scheduled to begin treatment with Cytoxan/prednisone/brentuximab today. We reviewed the potential toxicities associated with brentuximab and he agrees to proceed.  [name] insurance company has not approved the Brentuximab.  I contacted the [place]. and explained the rationale for the current treatment plan. He says he will get back to [place] within the next few hours with a decision on approval of the brentuximab.  30 minutes were spent with the patient today. The majority of the time was used for counseling and coordination of care.  Donneta Romberg, MD  07/12/2016  5:17 PM

## 2016-07-13 ENCOUNTER — Telehealth: Payer: Self-pay

## 2016-07-13 ENCOUNTER — Other Ambulatory Visit: Payer: Self-pay | Admitting: *Deleted

## 2016-07-13 NOTE — Telephone Encounter (Signed)
Call placed to pt to let him know that his prior-auth was approved this afternoon. Informed patient to expect a call from scheduling for an infusion appointment on 07/17/16. Pt appreciative of call back and states that he will wait for a call from scheduling.

## 2016-07-13 NOTE — Telephone Encounter (Signed)
Call placed to update patient on pending authorization of Brentuximab as requested. Informed pt that authorization is still pending with his insurance company. Pt appreciative of call back.

## 2016-07-14 ENCOUNTER — Telehealth: Payer: Self-pay | Admitting: Oncology

## 2016-07-14 NOTE — Telephone Encounter (Signed)
sw pt to confirm 6/26 appts per sch msg

## 2016-07-16 ENCOUNTER — Other Ambulatory Visit: Payer: Self-pay | Admitting: *Deleted

## 2016-07-16 DIAGNOSIS — C8590 Non-Hodgkin lymphoma, unspecified, unspecified site: Secondary | ICD-10-CM

## 2016-07-17 ENCOUNTER — Other Ambulatory Visit (HOSPITAL_BASED_OUTPATIENT_CLINIC_OR_DEPARTMENT_OTHER): Payer: 59

## 2016-07-17 ENCOUNTER — Telehealth: Payer: Self-pay | Admitting: *Deleted

## 2016-07-17 ENCOUNTER — Ambulatory Visit (HOSPITAL_BASED_OUTPATIENT_CLINIC_OR_DEPARTMENT_OTHER): Payer: 59

## 2016-07-17 ENCOUNTER — Other Ambulatory Visit: Payer: Self-pay | Admitting: Oncology

## 2016-07-17 VITALS — BP 120/82 | HR 52 | Temp 98.3°F | Resp 18

## 2016-07-17 DIAGNOSIS — C8590 Non-Hodgkin lymphoma, unspecified, unspecified site: Secondary | ICD-10-CM

## 2016-07-17 DIAGNOSIS — Z5112 Encounter for antineoplastic immunotherapy: Secondary | ICD-10-CM | POA: Diagnosis not present

## 2016-07-17 DIAGNOSIS — C866 Primary cutaneous CD30-positive T-cell proliferations: Secondary | ICD-10-CM

## 2016-07-17 DIAGNOSIS — Z5111 Encounter for antineoplastic chemotherapy: Secondary | ICD-10-CM | POA: Diagnosis not present

## 2016-07-17 LAB — CBC WITH DIFFERENTIAL/PLATELET
BASO%: 0.5 % (ref 0.0–2.0)
BASOS ABS: 0 10*3/uL (ref 0.0–0.1)
EOS%: 9.5 % — AB (ref 0.0–7.0)
Eosinophils Absolute: 0.4 10*3/uL (ref 0.0–0.5)
HCT: 41.1 % (ref 38.4–49.9)
HGB: 13.7 g/dL (ref 13.0–17.1)
LYMPH#: 0.5 10*3/uL — AB (ref 0.9–3.3)
LYMPH%: 11.3 % — AB (ref 14.0–49.0)
MCH: 29.9 pg (ref 27.2–33.4)
MCHC: 33.3 g/dL (ref 32.0–36.0)
MCV: 89.7 fL (ref 79.3–98.0)
MONO#: 0.6 10*3/uL (ref 0.1–0.9)
MONO%: 12.4 % (ref 0.0–14.0)
NEUT#: 2.9 10*3/uL (ref 1.5–6.5)
NEUT%: 66.3 % (ref 39.0–75.0)
Platelets: 173 10*3/uL (ref 140–400)
RBC: 4.58 10*6/uL (ref 4.20–5.82)
RDW: 13.8 % (ref 11.0–14.6)
WBC: 4.4 10*3/uL (ref 4.0–10.3)

## 2016-07-17 LAB — COMPREHENSIVE METABOLIC PANEL
ALK PHOS: 26 U/L — AB (ref 40–150)
ALT: 23 U/L (ref 0–55)
AST: 19 U/L (ref 5–34)
Albumin: 3.9 g/dL (ref 3.5–5.0)
Anion Gap: 11 mEq/L (ref 3–11)
BILIRUBIN TOTAL: 0.5 mg/dL (ref 0.20–1.20)
BUN: 11.4 mg/dL (ref 7.0–26.0)
CHLORIDE: 107 meq/L (ref 98–109)
CO2: 24 mEq/L (ref 22–29)
Calcium: 9.5 mg/dL (ref 8.4–10.4)
Creatinine: 0.9 mg/dL (ref 0.7–1.3)
EGFR: >90 mL/min/{1.73_m2} (ref >90)
Glucose: 94 mg/dl (ref 70–140)
POTASSIUM: 4.2 meq/L (ref 3.5–5.1)
SODIUM: 141 meq/L (ref 136–145)
Total Protein: 6.4 g/dL (ref 6.4–8.3)

## 2016-07-17 LAB — LACTATE DEHYDROGENASE: LDH: 209 U/L (ref 125–245)

## 2016-07-17 MED ORDER — PREDNISONE 20 MG PO TABS: 60.0000 mg | ORAL_TABLET | Freq: Every day | ORAL | Status: DC

## 2016-07-17 MED ORDER — SODIUM CHLORIDE 0.9 % IV SOLN
Freq: Once | INTRAVENOUS | Status: AC
  Administered 2016-07-17: 10:00:00 via INTRAVENOUS

## 2016-07-17 MED ORDER — PALONOSETRON HCL INJECTION 0.25 MG/5ML
INTRAVENOUS | Status: AC
  Filled 2016-07-17: qty 5

## 2016-07-17 MED ORDER — PALONOSETRON HCL INJECTION 0.25 MG/5ML
0.2500 mg | Freq: Once | INTRAVENOUS | Status: AC
  Administered 2016-07-17: 0.25 mg via INTRAVENOUS

## 2016-07-17 MED ORDER — DEXAMETHASONE SODIUM PHOSPHATE 10 MG/ML IJ SOLN
INTRAMUSCULAR | Status: AC
  Filled 2016-07-17: qty 1

## 2016-07-17 MED ORDER — SODIUM CHLORIDE 0.9% FLUSH
10.0000 mL | INTRAVENOUS | Status: DC | PRN
  Administered 2016-07-17: 10 mL
  Filled 2016-07-17: qty 10

## 2016-07-17 MED ORDER — HEPARIN SOD (PORK) LOCK FLUSH 100 UNIT/ML IV SOLN
500.0000 [IU] | Freq: Once | INTRAVENOUS | Status: AC | PRN
  Administered 2016-07-17: 500 [IU]
  Filled 2016-07-17: qty 5

## 2016-07-17 MED ORDER — SODIUM CHLORIDE 0.9 % IV SOLN
1.7500 mg/kg | Freq: Once | INTRAVENOUS | Status: AC
  Administered 2016-07-17: 150 mg via INTRAVENOUS
  Filled 2016-07-17: qty 30

## 2016-07-17 MED ORDER — DEXAMETHASONE SODIUM PHOSPHATE 10 MG/ML IJ SOLN
10.0000 mg | Freq: Once | INTRAMUSCULAR | Status: AC
  Administered 2016-07-17: 10 mg via INTRAVENOUS

## 2016-07-17 MED ORDER — SODIUM CHLORIDE 0.9 % IV SOLN
750.0000 mg/m2 | Freq: Once | INTRAVENOUS | Status: AC
  Administered 2016-07-17: 1540 mg via INTRAVENOUS
  Filled 2016-07-17: qty 77

## 2016-07-17 NOTE — Telephone Encounter (Signed)
Called pt with instructions to decrease Prednisone to 60 mg daily x4 days. Begin 6/27. Teach back complete.

## 2016-07-17 NOTE — Patient Instructions (Signed)
[place] Discharge Instructions for Patients Receiving Chemotherapy  Today you received the following chemotherapy agents:  Adcetris and Cytoxan.  To help prevent nausea and vomiting after your treatment, we encourage you to take your nausea medication as directed.   If you develop nausea and vomiting that is not controlled by your nausea medication, call the clinic.   BELOW ARE SYMPTOMS THAT SHOULD BE REPORTED IMMEDIATELY:  *FEVER GREATER THAN 100.5 F  *CHILLS WITH OR WITHOUT FEVER  NAUSEA AND VOMITING THAT IS NOT CONTROLLED WITH YOUR NAUSEA MEDICATION  *UNUSUAL SHORTNESS OF BREATH  *UNUSUAL BRUISING OR BLEEDING  TENDERNESS IN MOUTH AND THROAT WITH OR WITHOUT PRESENCE OF ULCERS  *URINARY PROBLEMS  *BOWEL PROBLEMS  UNUSUAL RASH Items with * indicate a potential emergency and should be followed up as soon as possible.  Feel free to call the clinic you have any questions or concerns. The clinic phone number is [phone].  Please show the [place] at check-in to the Emergency Department and triage nurse.  Brentuximab vedotin solution for injection What is this medicine? BRENTUXIMAB VEDOTIN (bren TUX see mab ve DOE tin) is a monoclonal antibody and a chemotherapy drug. It is used for treating Hodgkin lymphoma and certain non-Hodgkin lymphomas, such as anaplastic large-cell lymphoma and mycosis fungoides. This medicine may be used for other purposes; ask your health care provider or pharmacist if you have questions. COMMON BRAND NAME(S): ADCETRIS What should I tell my health care provider before I take this medicine? They need to know if you have any of these conditions: -immune system problems -infection (especially a virus infection such as chickenpox, cold sores, or herpes) -kidney disease -liver disease -low blood counts, like low white cell, platelet, or red cell counts -tingling of the fingers or toes, or other nerve disorder -an unusual  or allergic reaction to brentuximab vedotin, other medicines, foods, dyes, or preservatives -pregnant or trying to get pregnant -breast-feeding How should I use this medicine? This medicine is for infusion into a vein. It is given by a health care professional in a hospital or clinic setting. Talk to your pediatrician regarding the use of this medicine in children. Special care may be needed. Overdosage: If you think you have taken too much of this medicine contact a poison control center or emergency room at once. NOTE: This medicine is only for you. Do not share this medicine with others. What if I miss a dose? It is important not to miss your dose. Call your doctor or health care professional if you are unable to keep an appointment. What may interact with this medicine? This medicine may interact with the following medications: -ketoconazole -rifampin -St. John's wort; Hypericum perforatum This list may not describe all possible interactions. Give your health care provider a list of all the medicines, herbs, non-prescription drugs, or dietary supplements you use. Also tell them if you smoke, drink alcohol, or use illegal drugs. Some items may interact with your medicine. What should I watch for while using this medicine? Visit your doctor for checks on your progress. This drug may make you feel generally unwell. Report any side effects. Continue your course of treatment even though you feel ill unless your doctor tells you to stop. Call your doctor or health care professional for advice if you get a fever, chills or sore throat, or other symptoms of a cold or flu. Do not treat yourself. This drug decreases your body's ability to fight infections. Try to avoid being  around people who are sick. This medicine may increase your risk to bruise or bleed. Call your doctor or health care professional if you notice any unusual bleeding. In some patients, this medicine may cause a serious brain  infection that may cause death. If you have any problems seeing, thinking, speaking, walking, or standing, tell your doctor right away. If you cannot reach your doctor, urgently seek other source of medical care. Do not become pregnant while taking this medicine or for 6 months after stopping it. Women should inform their doctor if they wish to become pregnant or think they might be pregnant. Men should not father a child while taking this medicine and for 6 months after stopping it. There is a potential for serious side effects to an unborn child. Talk to your health care professional or pharmacist for more information. Do not breast-feed an infant while taking this medicine. This may interfere with the ability to father a child. You should talk to your doctor or health care professional if you are concerned about your fertility. What side effects may I notice from receiving this medicine? Side effects that you should report to your doctor or health care professional as soon as possible: -allergic reactions like skin rash, itching or hives, swelling of the face, lips, or tongue -changes in emotions or moods -diarrhea -low blood counts - this medicine may decrease the number of white blood cells, red blood cells and platelets. You may be at increased risk for infections and bleeding. -pain, tingling, numbness in the hands or feet -redness, blistering, peeling or loosening of the skin, including inside the mouth -shortness of breath -signs of infection - fever or chills, cough, sore throat, pain or difficulty passing urine -signs of decreased platelets or bleeding - bruising, pinpoint red spots on the skin, black, tarry stools, blood in the urine -signs of decreased red blood cells - unusually weak or tired, fainting spells, lightheadedness -signs of liver injury like dark yellow or brown urine; general ill feeling or flu-like symptoms; light-colored stools; loss of appetite; nausea; right upper belly  pain; yellowing of the eyes or skin -stomach pain -sudden numbness or weakness of the face, arm or leg -vomiting Side effects that usually do not require medical attention (report to your doctor or health care professional if they continue or are bothersome): -dizziness -headache -muscle pain -tiredness This list may not describe all possible side effects. Call your doctor for medical advice about side effects. You may report side effects to FDA at 1-800-FDA-1088. Where should I keep my medicine? This drug is given in a hospital or clinic and will not be stored at home. NOTE: This sheet is a summary. It may not cover all possible information. If you have questions about this medicine, talk to your doctor, pharmacist, or health care provider.  2018 Elsevier/Gold Standard (2015-12-02 18:06:58)

## 2016-07-18 ENCOUNTER — Telehealth: Payer: Self-pay | Admitting: *Deleted

## 2016-07-18 NOTE — Telephone Encounter (Signed)
-----   Message from Sinda Du, RN sent at 07/17/2016 12:17 PM EDT ----- Regarding: Dr. [name] - 1st chemo f/u New agent - Adcetris

## 2016-07-18 NOTE — Telephone Encounter (Signed)
Spoke with pt's wife, she reports he is feeling well. No side effects from new treatment. Reminded her to have him push PO fluids. Call office with any issues. She voiced understanding. Wife wants to be sure orders are signed for next infusion.

## 2016-07-19 ENCOUNTER — Telehealth: Payer: Self-pay | Admitting: *Deleted

## 2016-07-19 NOTE — Telephone Encounter (Signed)
Message from pt reporting diarrhea. Taking Imodium, requested prescription anti-diarrheal. Returned call, spoke with wife. She estimates he's had about 5 loose stools. Began this morning. No other symptoms. He has not had a stool in past few hours.  Informed her that he can take up to 8 imodium per day. Call if diarrhea persists despite Imodium. Wife voiced understanding. Reviewed with MD. Per Dr. [name], will order Lomotil 1-2 QID PRN diarrhea if Imodium ineffective.

## 2016-08-03 ENCOUNTER — Other Ambulatory Visit: Payer: Self-pay | Admitting: Oncology

## 2016-08-07 ENCOUNTER — Ambulatory Visit (HOSPITAL_BASED_OUTPATIENT_CLINIC_OR_DEPARTMENT_OTHER): Payer: 59

## 2016-08-07 ENCOUNTER — Other Ambulatory Visit (HOSPITAL_BASED_OUTPATIENT_CLINIC_OR_DEPARTMENT_OTHER): Payer: 59

## 2016-08-07 ENCOUNTER — Ambulatory Visit (HOSPITAL_BASED_OUTPATIENT_CLINIC_OR_DEPARTMENT_OTHER): Payer: 59 | Admitting: Nurse Practitioner

## 2016-08-07 ENCOUNTER — Ambulatory Visit: Payer: 59

## 2016-08-07 VITALS — BP 153/86 | HR 60 | Temp 98.5°F | Resp 18 | Ht 68.0 in | Wt 199.4 lb

## 2016-08-07 DIAGNOSIS — Z86718 Personal history of other venous thrombosis and embolism: Secondary | ICD-10-CM

## 2016-08-07 DIAGNOSIS — Z7901 Long term (current) use of anticoagulants: Secondary | ICD-10-CM | POA: Diagnosis not present

## 2016-08-07 DIAGNOSIS — Z5111 Encounter for antineoplastic chemotherapy: Secondary | ICD-10-CM | POA: Diagnosis not present

## 2016-08-07 DIAGNOSIS — I10 Essential (primary) hypertension: Secondary | ICD-10-CM | POA: Diagnosis not present

## 2016-08-07 DIAGNOSIS — C8590 Non-Hodgkin lymphoma, unspecified, unspecified site: Secondary | ICD-10-CM

## 2016-08-07 DIAGNOSIS — C866 Primary cutaneous CD30-positive T-cell proliferations: Secondary | ICD-10-CM

## 2016-08-07 DIAGNOSIS — Z95828 Presence of other vascular implants and grafts: Secondary | ICD-10-CM

## 2016-08-07 DIAGNOSIS — Z5112 Encounter for antineoplastic immunotherapy: Secondary | ICD-10-CM | POA: Diagnosis not present

## 2016-08-07 LAB — CBC WITH DIFFERENTIAL/PLATELET
BASO%: 0.8 % (ref 0.0–2.0)
Basophils Absolute: 0 10*3/uL (ref 0.0–0.1)
EOS ABS: 0 10*3/uL (ref 0.0–0.5)
EOS%: 1.6 % (ref 0.0–7.0)
HCT: 40.9 % (ref 38.4–49.9)
HEMOGLOBIN: 13.7 g/dL (ref 13.0–17.1)
LYMPH%: 15.2 % (ref 14.0–49.0)
MCH: 29.8 pg (ref 27.2–33.4)
MCHC: 33.5 g/dL (ref 32.0–36.0)
MCV: 88.9 fL (ref 79.3–98.0)
MONO#: 0.5 10*3/uL (ref 0.1–0.9)
MONO%: 21.2 % — ABNORMAL HIGH (ref 0.0–14.0)
NEUT%: 61.2 % (ref 39.0–75.0)
NEUTROS ABS: 1.5 10*3/uL (ref 1.5–6.5)
PLATELETS: 187 10*3/uL (ref 140–400)
RBC: 4.6 10*6/uL (ref 4.20–5.82)
RDW: 14.2 % (ref 11.0–14.6)
WBC: 2.5 10*3/uL — AB (ref 4.0–10.3)
lymph#: 0.4 10*3/uL — ABNORMAL LOW (ref 0.9–3.3)

## 2016-08-07 LAB — COMPREHENSIVE METABOLIC PANEL
ALBUMIN: 3.8 g/dL (ref 3.5–5.0)
ALT: 39 U/L (ref 0–55)
ANION GAP: 10 meq/L (ref 3–11)
AST: 25 U/L (ref 5–34)
Alkaline Phosphatase: 27 U/L — ABNORMAL LOW (ref 40–150)
BILIRUBIN TOTAL: 0.5 mg/dL (ref 0.20–1.20)
BUN: 13.5 mg/dL (ref 7.0–26.0)
CO2: 24 meq/L (ref 22–29)
Calcium: 9.2 mg/dL (ref 8.4–10.4)
Chloride: 106 mEq/L (ref 98–109)
Creatinine: 0.9 mg/dL (ref 0.7–1.3)
EGFR: >90 mL/min/{1.73_m2} (ref >90)
Glucose: 182 mg/dl — ABNORMAL HIGH (ref 70–140)
Potassium: 4 mEq/L (ref 3.5–5.1)
SODIUM: 139 meq/L (ref 136–145)
TOTAL PROTEIN: 6.3 g/dL — AB (ref 6.4–8.3)

## 2016-08-07 LAB — LACTATE DEHYDROGENASE: LDH: 242 U/L (ref 125–245)

## 2016-08-07 MED ORDER — ACETAMINOPHEN 325 MG PO TABS
650.0000 mg | ORAL_TABLET | Freq: Once | ORAL | Status: AC
  Administered 2016-08-07: 650 mg via ORAL

## 2016-08-07 MED ORDER — PREDNISONE 20 MG PO TABS: 60.0000 mg | ORAL_TABLET | Freq: Every day | ORAL | 0 refills | Status: DC

## 2016-08-07 MED ORDER — SODIUM CHLORIDE 0.9% FLUSH
10.0000 mL | INTRAVENOUS | Status: DC | PRN
  Filled 2016-08-07: qty 10

## 2016-08-07 MED ORDER — PALONOSETRON HCL INJECTION 0.25 MG/5ML
INTRAVENOUS | Status: AC
  Filled 2016-08-07: qty 5

## 2016-08-07 MED ORDER — DEXAMETHASONE SODIUM PHOSPHATE 10 MG/ML IJ SOLN
10.0000 mg | Freq: Once | INTRAMUSCULAR | Status: AC
  Administered 2016-08-07: 10 mg via INTRAVENOUS

## 2016-08-07 MED ORDER — ACETAMINOPHEN 325 MG PO TABS
ORAL_TABLET | ORAL | Status: AC
  Filled 2016-08-07: qty 2

## 2016-08-07 MED ORDER — BRENTUXIMAB VEDOTIN 50 MG IV SOLR
1.1500 mg/kg | Freq: Once | INTRAVENOUS | Status: AC
  Administered 2016-08-07: 100 mg via INTRAVENOUS
  Filled 2016-08-07: qty 20

## 2016-08-07 MED ORDER — DIPHENHYDRAMINE HCL 50 MG/ML IJ SOLN
50.0000 mg | Freq: Once | INTRAMUSCULAR | Status: AC
  Administered 2016-08-07: 50 mg via INTRAVENOUS

## 2016-08-07 MED ORDER — SODIUM CHLORIDE 0.9 % IV SOLN
600.0000 mg/m2 | Freq: Once | INTRAVENOUS | Status: AC
  Administered 2016-08-07: 1220 mg via INTRAVENOUS
  Filled 2016-08-07: qty 61

## 2016-08-07 MED ORDER — DEXAMETHASONE SODIUM PHOSPHATE 10 MG/ML IJ SOLN
INTRAMUSCULAR | Status: AC
  Filled 2016-08-07: qty 1

## 2016-08-07 MED ORDER — HEPARIN SOD (PORK) LOCK FLUSH 100 UNIT/ML IV SOLN
500.0000 [IU] | Freq: Once | INTRAVENOUS | Status: AC | PRN
  Administered 2016-08-07: 500 [IU]
  Filled 2016-08-07: qty 5

## 2016-08-07 MED ORDER — SODIUM CHLORIDE 0.9% FLUSH
10.0000 mL | INTRAVENOUS | Status: DC | PRN
  Administered 2016-08-07 (×2): 10 mL via INTRAVENOUS
  Filled 2016-08-07: qty 10

## 2016-08-07 MED ORDER — SODIUM CHLORIDE 0.9 % IV SOLN
Freq: Once | INTRAVENOUS | Status: AC
  Administered 2016-08-07: 12:00:00 via INTRAVENOUS

## 2016-08-07 MED ORDER — DIPHENHYDRAMINE HCL 50 MG/ML IJ SOLN
INTRAMUSCULAR | Status: AC
  Filled 2016-08-07: qty 1

## 2016-08-07 MED ORDER — PALONOSETRON HCL INJECTION 0.25 MG/5ML
0.2500 mg | Freq: Once | INTRAVENOUS | Status: AC
  Administered 2016-08-07: 0.25 mg via INTRAVENOUS

## 2016-08-07 NOTE — Progress Notes (Signed)
[facility] OFFICE PROGRESS NOTE   Diagnosis:  T-cell lymphoma  INTERVAL HISTORY:   [name] returns as scheduled. He completed Cytoxan/prednisone/brentuximab 07/17/2016. He denies nausea/vomiting. No mouth sores. He had diarrhea for 1 day, controlled with Imodium. He has had a few loose stools since then. He reports a good appetite. No fevers or sweats.  Objective:  Vital signs in last 24 hours:  Blood pressure (!) 153/86, pulse 60, temperature 98.5 F (36.9 C), temperature source Oral, resp. rate 18, height 5\' 8"  (1.727 m), weight 199 lb 6.4 oz (90.4 kg), SpO2 100 %.    HEENT: No thrush or ulcers. Lymphatics: No palpable cervical, supraclavicular, axillary or inguinal lymph nodes. Resp: Lungs clear bilaterally. Cardio: Regular rate and rhythm. GI: Abdomen soft and nontender. No hepatosplenomegaly. Vascular: No leg edema.  Port-A-Cath without erythema.  Lab Results:  Lab Results  Component Value Date   WBC 2.5 (L) 08/07/2016   HGB 13.7 08/07/2016   HCT 40.9 08/07/2016   MCV 88.9 08/07/2016   PLT 187 08/07/2016   NEUTROABS 1.5 08/07/2016    Imaging:  No results found.  Medications: I have reviewed the patient's current medications.  Assessment/Plan: 1. 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  PET scan 03/20/2016 with extensive bulky hypermetabolic nodal activity in the neck, chest, abdomen and pelvis; mild splenomegaly with diffusely mildly accentuated splenic activity  Staging bone marrow biopsy 03/23/2016-negative for involvement with lymphoma  Cycle 1  EPOCH beginning 03/23/2016  Cycle 2 [facility] 04/13/2016  Restaging PET scan at [facility] 05/08/2016-lymph nodes with various degrees of FDG activity in the neck, axilla, right written him, mesentery, pelvis, and groin. Indeterminate liver lesions without FDG activity, nodular mass in the posterior medial aspect of the left thigh  Cycle 3 EPOCH04/27/2018  Cycle 1 CVP 06/21/2016  Cytoxan/prednisone plus brentuximab 07/17/2016  Rituxan/prednisone plus brentuximab 08/07/2016 (dose reduced) 2. 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 He entered clinical remission following chemotherapy and remained in remission when he was seen at the cancer center 02/24/2009. 3. 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 Staging PET scan 08/23/2010 with increased FDG activity at the left tonsillar fossa and no additional evidence of lymphoma. He completed 4 cycles of R-ICE with cycle #1 beginning on 09/19/2010 and cycle #4 on 11/21/2010. Repeat head and neck examination by Dr. 11/23/2010 following R-ICE/rituximab showed no residual lymphoma. He began radiation consolidation on 12/18/2010, radiation was completed on 01/22/2011. 4. History of neutropenia secondary to rituximab. 5. Anemia secondary to chemotherapy, status post a red blood cell transfusion 11/30/2010. The hemoglobin has normalized. 6. History of mild thrombocytopenia secondary to chemotherapy. 7. Hypertension. 8. 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 05/08/2017-nodular mass in the left thigh adjacent to the femur  MRI 06/07/2016-infiltrative mass in the left thigh adductor muscle, separate from the schwannoma excision site 9. Port-A-Cath  placement 03/21/2016 10. Superficial thrombus left greater saphenous vein 04/13/2016. Lovenox initiated, converted to  Xarelto beginning 04/18/2016   Disposition: [name] appears stable. He completed a cycle of Cytoxan/prednisone/brentuximab 3 weeks ago. The absolute neutrophil count is in the low normal range. I reviewed today's labs with [name] and Dr. Burr Medico. The Cytoxan and brentuximab will be dose reduced and Neulasta added 08/08/2016. [name] is in agreement with this plan. He understands to contact the office with fever, chills, other signs of infection. He will return for a follow-up CBC in one week. He will return as scheduled for follow-up in 3 weeks.  Plan reviewed with Dr. Burr Medico.    [name] ANP/GNP-BC   08/07/2016  10:44 AM

## 2016-08-07 NOTE — Patient Instructions (Signed)
[place] Discharge Instructions for Patients Receiving Chemotherapy  Today you received the following chemotherapy agents: Adcetris and Cytoxan   To help prevent nausea and vomiting after your treatment, we encourage you to take your nausea medication as directed.    If you develop nausea and vomiting that is not controlled by your nausea medication, call the clinic.   BELOW ARE SYMPTOMS THAT SHOULD BE REPORTED IMMEDIATELY:  *FEVER GREATER THAN 100.5 F  *CHILLS WITH OR WITHOUT FEVER  NAUSEA AND VOMITING THAT IS NOT CONTROLLED WITH YOUR NAUSEA MEDICATION  *UNUSUAL SHORTNESS OF BREATH  *UNUSUAL BRUISING OR BLEEDING  TENDERNESS IN MOUTH AND THROAT WITH OR WITHOUT PRESENCE OF ULCERS  *URINARY PROBLEMS  *BOWEL PROBLEMS  UNUSUAL RASH Items with * indicate a potential emergency and should be followed up as soon as possible.  Feel free to call the clinic you have any questions or concerns. The clinic phone number is [phone].  Please show the [place] at check-in to the Emergency Department and triage nurse.

## 2016-08-07 NOTE — Progress Notes (Signed)
Per Lattie Haw NP okay to treat pt with WBC of 2.2, with dose reduction and nuelasta 08/08/16

## 2016-08-07 NOTE — Patient Instructions (Signed)
Implanted Port Home Guide An implanted port is a type of central line that is placed under the skin. Central lines are used to provide IV access when treatment or nutrition needs to be given through a person's veins. Implanted ports are used for long-term IV access. An implanted port may be placed because:  You need IV medicine that would be irritating to the small veins in your hands or arms.  You need long-term IV medicines, such as antibiotics.  You need IV nutrition for a long period.  You need frequent blood draws for lab tests.  You need dialysis.  Implanted ports are usually placed in the chest area, but they can also be placed in the upper arm, the abdomen, or the leg. An implanted port has two main parts:  Reservoir. The reservoir is round and will appear as a small, raised area under your skin. The reservoir is the part where a needle is inserted to give medicines or draw blood.  Catheter. The catheter is a thin, flexible tube that extends from the reservoir. The catheter is placed into a large vein. Medicine that is inserted into the reservoir goes into the catheter and then into the vein.  How will I care for my incision site? Do not get the incision site wet. Bathe or shower as directed by your health care provider. How is my port accessed? Special steps must be taken to access the port:  Before the port is accessed, a numbing cream can be placed on the skin. This helps numb the skin over the port site.  Your health care provider uses a sterile technique to access the port. ? Your health care provider must put on a mask and sterile gloves. ? The skin over your port is cleaned carefully with an antiseptic and allowed to dry. ? The port is gently pinched between sterile gloves, and a needle is inserted into the port.  Only "non-coring" port needles should be used to access the port. Once the port is accessed, a blood return should be checked. This helps ensure that the port  is in the vein and is not clogged.  If your port needs to remain accessed for a constant infusion, a clear (transparent) bandage will be placed over the needle site. The bandage and needle will need to be changed every week, or as directed by your health care provider.  Keep the bandage covering the needle clean and dry. Do not get it wet. Follow your health care provider's instructions on how to take a shower or bath while the port is accessed.  If your port does not need to stay accessed, no bandage is needed over the port.  What is flushing? Flushing helps keep the port from getting clogged. Follow your health care provider's instructions on how and when to flush the port. Ports are usually flushed with saline solution or a medicine called heparin. The need for flushing will depend on how the port is used.  If the port is used for intermittent medicines or blood draws, the port will need to be flushed: ? After medicines have been given. ? After blood has been drawn. ? As part of routine maintenance.  If a constant infusion is running, the port may not need to be flushed.  How long will my port stay implanted? The port can stay in for as long as your health care provider thinks it is needed. When it is time for the port to come out, surgery will be   done to remove it. The procedure is similar to the one performed when the port was put in. When should I seek immediate medical care? When you have an implanted port, you should seek immediate medical care if:  You notice a bad smell coming from the incision site.  You have swelling, redness, or drainage at the incision site.  You have more swelling or pain at the port site or the surrounding area.  You have a fever that is not controlled with medicine.  This information is not intended to replace advice given to you by your health care provider. Make sure you discuss any questions you have with your health care provider. Document  Released: 01/08/2005 Document Revised: 06/16/2015 Document Reviewed: 09/15/2012 Elsevier Interactive Patient Education  2017 Elsevier Inc.  

## 2016-08-08 ENCOUNTER — Telehealth: Payer: Self-pay | Admitting: *Deleted

## 2016-08-08 NOTE — Telephone Encounter (Signed)
Call from pt asking if Neulasta has been authorized by his insurance company. Left message with managed care dept for follow up. Pt will need appt for 7/19.

## 2016-08-13 ENCOUNTER — Other Ambulatory Visit (HOSPITAL_BASED_OUTPATIENT_CLINIC_OR_DEPARTMENT_OTHER): Payer: 59

## 2016-08-13 DIAGNOSIS — C8590 Non-Hodgkin lymphoma, unspecified, unspecified site: Secondary | ICD-10-CM

## 2016-08-13 DIAGNOSIS — C866 Primary cutaneous CD30-positive T-cell proliferations: Secondary | ICD-10-CM | POA: Diagnosis not present

## 2016-08-13 LAB — CBC WITH DIFFERENTIAL/PLATELET
BASO%: 0.6 % (ref 0.0–2.0)
Basophils Absolute: 0 10*3/uL (ref 0.0–0.1)
EOS%: 1.3 % (ref 0.0–7.0)
Eosinophils Absolute: 0.1 10*3/uL (ref 0.0–0.5)
HCT: 42.4 % (ref 38.4–49.9)
HEMOGLOBIN: 14.6 g/dL (ref 13.0–17.1)
LYMPH#: 0.6 10*3/uL — AB (ref 0.9–3.3)
LYMPH%: 8 % — AB (ref 14.0–49.0)
MCH: 30.4 pg (ref 27.2–33.4)
MCHC: 34.4 g/dL (ref 32.0–36.0)
MCV: 88.1 fL (ref 79.3–98.0)
MONO#: 0.7 10*3/uL (ref 0.1–0.9)
MONO%: 9.8 % (ref 0.0–14.0)
NEUT#: 5.6 10*3/uL (ref 1.5–6.5)
NEUT%: 80.3 % — AB (ref 39.0–75.0)
Platelets: 184 10*3/uL (ref 140–400)
RBC: 4.81 10*6/uL (ref 4.20–5.82)
RDW: 14.3 % (ref 11.0–14.6)
WBC: 6.9 10*3/uL (ref 4.0–10.3)
nRBC: 0 % (ref 0–0)

## 2016-08-20 ENCOUNTER — Telehealth: Payer: Self-pay | Admitting: Oncology

## 2016-08-20 NOTE — Telephone Encounter (Signed)
sw pt wife to confirm r/s appt to 8/8 per sch msg

## 2016-08-26 ENCOUNTER — Other Ambulatory Visit: Payer: Self-pay | Admitting: Oncology

## 2016-08-28 ENCOUNTER — Ambulatory Visit: Payer: Self-pay | Admitting: Oncology

## 2016-08-28 ENCOUNTER — Ambulatory Visit: Payer: Self-pay

## 2016-08-28 ENCOUNTER — Other Ambulatory Visit: Payer: Self-pay

## 2016-08-29 ENCOUNTER — Ambulatory Visit (HOSPITAL_BASED_OUTPATIENT_CLINIC_OR_DEPARTMENT_OTHER): Payer: 59

## 2016-08-29 ENCOUNTER — Ambulatory Visit: Payer: 59

## 2016-08-29 ENCOUNTER — Telehealth: Payer: Self-pay | Admitting: Oncology

## 2016-08-29 ENCOUNTER — Ambulatory Visit (HOSPITAL_BASED_OUTPATIENT_CLINIC_OR_DEPARTMENT_OTHER): Payer: 59 | Admitting: Oncology

## 2016-08-29 ENCOUNTER — Other Ambulatory Visit (HOSPITAL_BASED_OUTPATIENT_CLINIC_OR_DEPARTMENT_OTHER): Payer: 59

## 2016-08-29 VITALS — BP 131/86 | HR 57 | Temp 98.9°F | Resp 20 | Ht 68.0 in | Wt 199.2 lb

## 2016-08-29 DIAGNOSIS — C866 Primary cutaneous CD30-positive T-cell proliferations: Secondary | ICD-10-CM

## 2016-08-29 DIAGNOSIS — Z5112 Encounter for antineoplastic immunotherapy: Secondary | ICD-10-CM | POA: Diagnosis not present

## 2016-08-29 DIAGNOSIS — C8331 Diffuse large B-cell lymphoma, lymph nodes of head, face, and neck: Secondary | ICD-10-CM

## 2016-08-29 DIAGNOSIS — C8590 Non-Hodgkin lymphoma, unspecified, unspecified site: Secondary | ICD-10-CM

## 2016-08-29 DIAGNOSIS — Z95828 Presence of other vascular implants and grafts: Secondary | ICD-10-CM

## 2016-08-29 LAB — CBC WITH DIFFERENTIAL/PLATELET
BASO%: 1.3 % (ref 0.0–2.0)
BASOS ABS: 0 10*3/uL (ref 0.0–0.1)
EOS ABS: 0.1 10*3/uL (ref 0.0–0.5)
EOS%: 2 % (ref 0.0–7.0)
HCT: 41.3 % (ref 38.4–49.9)
HGB: 14 g/dL (ref 13.0–17.1)
LYMPH#: 0.5 10*3/uL — AB (ref 0.9–3.3)
LYMPH%: 12.6 % — ABNORMAL LOW (ref 14.0–49.0)
MCH: 30.1 pg (ref 27.2–33.4)
MCHC: 34 g/dL (ref 32.0–36.0)
MCV: 88.6 fL (ref 79.3–98.0)
MONO#: 0.7 10*3/uL (ref 0.1–0.9)
MONO%: 20.1 % — ABNORMAL HIGH (ref 0.0–14.0)
NEUT%: 64 % (ref 39.0–75.0)
NEUTROS ABS: 2.3 10*3/uL (ref 1.5–6.5)
PLATELETS: 208 10*3/uL (ref 140–400)
RBC: 4.66 10*6/uL (ref 4.20–5.82)
RDW: 15.4 % — ABNORMAL HIGH (ref 11.0–14.6)
WBC: 3.6 10*3/uL — ABNORMAL LOW (ref 4.0–10.3)

## 2016-08-29 LAB — COMPREHENSIVE METABOLIC PANEL
ALT: 39 U/L (ref 0–55)
ANION GAP: 9 meq/L (ref 3–11)
AST: 23 U/L (ref 5–34)
Albumin: 3.7 g/dL (ref 3.5–5.0)
Alkaline Phosphatase: 25 U/L — ABNORMAL LOW (ref 40–150)
BUN: 13.6 mg/dL (ref 7.0–26.0)
CHLORIDE: 106 meq/L (ref 98–109)
CO2: 24 meq/L (ref 22–29)
Calcium: 9.6 mg/dL (ref 8.4–10.4)
Creatinine: 0.9 mg/dL (ref 0.7–1.3)
GLUCOSE: 96 mg/dL (ref 70–140)
Potassium: 4.3 mEq/L (ref 3.5–5.1)
SODIUM: 139 meq/L (ref 136–145)
Total Bilirubin: 0.7 mg/dL (ref 0.20–1.20)
Total Protein: 6.3 g/dL — ABNORMAL LOW (ref 6.4–8.3)

## 2016-08-29 LAB — LACTATE DEHYDROGENASE: LDH: 222 U/L (ref 125–245)

## 2016-08-29 MED ORDER — ACETAMINOPHEN 325 MG PO TABS
650.0000 mg | ORAL_TABLET | Freq: Once | ORAL | Status: AC
  Administered 2016-08-29: 650 mg via ORAL

## 2016-08-29 MED ORDER — ACETAMINOPHEN 325 MG PO TABS
ORAL_TABLET | ORAL | Status: AC
  Filled 2016-08-29: qty 2

## 2016-08-29 MED ORDER — SODIUM CHLORIDE 0.9% FLUSH
10.0000 mL | Freq: Once | INTRAVENOUS | Status: AC
  Administered 2016-08-29: 10 mL
  Filled 2016-08-29: qty 10

## 2016-08-29 MED ORDER — SODIUM CHLORIDE 0.9 % IV SOLN
1.7500 mg/kg | Freq: Once | INTRAVENOUS | Status: AC
  Administered 2016-08-29: 150 mg via INTRAVENOUS
  Filled 2016-08-29: qty 30

## 2016-08-29 MED ORDER — SODIUM CHLORIDE 0.9% FLUSH
10.0000 mL | INTRAVENOUS | Status: DC | PRN
  Administered 2016-08-29: 10 mL
  Filled 2016-08-29: qty 10

## 2016-08-29 MED ORDER — DIPHENHYDRAMINE HCL 50 MG/ML IJ SOLN
50.0000 mg | Freq: Once | INTRAMUSCULAR | Status: AC
  Administered 2016-08-29: 50 mg via INTRAVENOUS

## 2016-08-29 MED ORDER — DIPHENHYDRAMINE HCL 50 MG/ML IJ SOLN
INTRAMUSCULAR | Status: AC
  Filled 2016-08-29: qty 1

## 2016-08-29 MED ORDER — SODIUM CHLORIDE 0.9 % IV SOLN
Freq: Once | INTRAVENOUS | Status: AC
  Administered 2016-08-29: 12:00:00 via INTRAVENOUS

## 2016-08-29 MED ORDER — HEPARIN SOD (PORK) LOCK FLUSH 100 UNIT/ML IV SOLN
500.0000 [IU] | Freq: Once | INTRAVENOUS | Status: AC | PRN
  Administered 2016-08-29: 500 [IU]
  Filled 2016-08-29: qty 5

## 2016-08-29 NOTE — Telephone Encounter (Signed)
Gave patient avs and calendar for August.  °

## 2016-08-29 NOTE — Progress Notes (Signed)
[facility] OFFICE PROGRESS NOTE   Diagnosis: T-cell lymphoma  INTERVAL HISTORY:   [name] returns as scheduled. He completed another cycle of Cytoxan/prednisone/brentuximab on 08/07/2016. No nausea/vomiting, fever, night sweats, or neuropathy symptoms. He is exercising. He is concerned that he is not losing weight. No palpable lymph nodes. Stable numbness at the left heel. No left leg pain.  He reports developing transient confusion while receiving Decadron on 08/07/2016. This resolved after a few minutes.  Objective:  Vital signs in last 24 hours:  Blood pressure 131/86, pulse (!) 57, temperature 98.9 F (37.2 C), temperature source Oral, resp. rate 20, height '5\' 8"'$  (1.727 m), weight 199 lb 3.2 oz (90.4 kg), SpO2 99 %.    HEENT: No thrush or ulcers Lymphatics: No cervical, supraclavicular, axillary, or inguinal nodes Resp: Lungs clear bilaterally Cardio: Regular rate and rhythm GI: No hepatosplenomegaly, nontender, no apparent ascites, no mass Vascular: No leg edema    Portacath/PICC-without erythema  Lab Results:  Lab Results  Component Value Date   WBC 3.6 (L) 08/29/2016   HGB 14.0 08/29/2016   HCT 41.3 08/29/2016   MCV 88.6 08/29/2016   PLT 208 08/29/2016   NEUTROABS 2.3 08/29/2016    CMP     Component Value Date/Time   NA 139 08/29/2016 0925   K 4.3 08/29/2016 0925   CL 106 05/18/2016 1250   CO2 24 08/29/2016 0925   GLUCOSE 96 08/29/2016 0925   BUN 13.6 08/29/2016 0925   CREATININE 0.9 08/29/2016 0925   CALCIUM 9.6 08/29/2016 0925   PROT 6.3 (L) 08/29/2016 0925   ALBUMIN 3.7 08/29/2016 0925   AST 23 08/29/2016 0925   ALT 39 08/29/2016 0925   ALKPHOS 25 (L) 08/29/2016 0925   BILITOT 0.70 08/29/2016 0925   GFRNONAA >60 05/18/2016 1250   GFRAA >60 05/18/2016 1250     Medications: I have reviewed the patient's current medications.  Assessment/plan:  1. 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  PET scan 03/20/2016 with extensive bulky hypermetabolic nodal activity in the neck, chest, abdomen and pelvis; mild splenomegaly with diffusely mildly accentuated splenic activity  Staging bone marrow biopsy 03/23/2016-negative for involvement with lymphoma  Cycle 1 EPOCH beginning 03/23/2016  Cycle 2 [facility] 04/13/2016  Restaging PET scan at [facility] 05/08/2016-lymph nodes with various degrees of FDG activity in the neck, axilla, right written him, mesentery, pelvis, and groin. Indeterminate liver lesions without FDG activity, nodular mass in the posterior medial aspect of the left thigh  Cycle 3 EPOCH04/27/2018  Cycle 1 CVP 06/21/2016  Cytoxan/prednisone plus brentuximab 07/17/2016  Cytoxan/prednisone plus brentuximab 08/07/2016 (dose reduced)  Initiation of every three-week brentuximab 08/29/2016 2. 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 He entered clinical remission following chemotherapy and remained in remission when he was seen at the cancer center 02/24/2009. 3. 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 Staging PET scan 08/23/2010 with increased FDG activity at the left tonsillar fossa and no additional evidence of lymphoma. He completed 4 cycles  of R-ICE with cycle #1 beginning on 09/19/2010 and cycle #4 on 11/21/2010. Repeat head and neck examination by Dr. Constance Holster following  R-ICE/rituximab showed no residual lymphoma. He began radiation consolidation on 12/18/2010, radiation was completed on 01/22/2011. 4. History of neutropenia secondary to rituximab. 5. Anemia secondary to chemotherapy, status post a red blood cell transfusion 11/30/2010. The hemoglobin has normalized. 6. History of mild thrombocytopenia secondary to chemotherapy. 7. Hypertension. 8. Left thigh mass-status post surgical excision [facility] confirming a schwannoma  PET scan at [facility] 05/08/2017-nodular mass in the left thigh adjacent to the femur  MRI 06/07/2016-infiltrative mass in the left thigh adductor muscle, separate from the schwannoma excision site 9. Port-A-Cath placement 03/21/2016 10. Superficial thrombus left greater saphenous vein 04/13/2016. Lovenox initiated, converted to Xarelto beginning 04/18/2016   Disposition:  [name] is in clinical remission from [place]. He appears to be tolerating the brentuximab well. He will Begin every three-week treatment today. He will return for an office and lab visit in 3 weeks.  15 minutes were spent with the patient today. The majority of the time was used for counseling and coordination of care.    Donneta Romberg, MD  08/29/2016  10:50 AM

## 2016-08-29 NOTE — Patient Instructions (Signed)
[facility] Discharge Instructions for Patients Receiving Chemotherapy  Today you received the following chemotherapy agents Adcetris.  To help prevent nausea and vomiting after your treatment, we encourage you to take your nausea medication as directed.    If you develop nausea and vomiting that is not controlled by your nausea medication, call the clinic.   BELOW ARE SYMPTOMS THAT SHOULD BE REPORTED IMMEDIATELY:  *FEVER GREATER THAN 100.5 F  *CHILLS WITH OR WITHOUT FEVER  NAUSEA AND VOMITING THAT IS NOT CONTROLLED WITH YOUR NAUSEA MEDICATION  *UNUSUAL SHORTNESS OF BREATH  *UNUSUAL BRUISING OR BLEEDING  TENDERNESS IN MOUTH AND THROAT WITH OR WITHOUT PRESENCE OF ULCERS  *URINARY PROBLEMS  *BOWEL PROBLEMS  UNUSUAL RASH Items with * indicate a potential emergency and should be followed up as soon as possible.  Feel free to call the clinic you have any questions or concerns. The clinic phone number is [phone].  Please show the CHEMO ALERT CARD at check-in to the Emergency Department and triage nurse.    

## 2016-09-16 ENCOUNTER — Other Ambulatory Visit: Payer: Self-pay | Admitting: Oncology

## 2016-09-19 ENCOUNTER — Other Ambulatory Visit (HOSPITAL_BASED_OUTPATIENT_CLINIC_OR_DEPARTMENT_OTHER): Payer: 59

## 2016-09-19 ENCOUNTER — Ambulatory Visit (HOSPITAL_BASED_OUTPATIENT_CLINIC_OR_DEPARTMENT_OTHER): Payer: 59

## 2016-09-19 ENCOUNTER — Ambulatory Visit (HOSPITAL_BASED_OUTPATIENT_CLINIC_OR_DEPARTMENT_OTHER): Payer: 59 | Admitting: Nurse Practitioner

## 2016-09-19 ENCOUNTER — Telehealth: Payer: Self-pay

## 2016-09-19 VITALS — BP 135/82 | HR 56 | Temp 98.8°F | Resp 18 | Ht 68.0 in | Wt 199.6 lb

## 2016-09-19 DIAGNOSIS — C866 Primary cutaneous CD30-positive T-cell proliferations: Secondary | ICD-10-CM

## 2016-09-19 DIAGNOSIS — R600 Localized edema: Secondary | ICD-10-CM | POA: Diagnosis not present

## 2016-09-19 DIAGNOSIS — Z5112 Encounter for antineoplastic immunotherapy: Secondary | ICD-10-CM | POA: Diagnosis not present

## 2016-09-19 DIAGNOSIS — Z86718 Personal history of other venous thrombosis and embolism: Secondary | ICD-10-CM | POA: Diagnosis not present

## 2016-09-19 DIAGNOSIS — C8590 Non-Hodgkin lymphoma, unspecified, unspecified site: Secondary | ICD-10-CM

## 2016-09-19 DIAGNOSIS — Z7901 Long term (current) use of anticoagulants: Secondary | ICD-10-CM | POA: Diagnosis not present

## 2016-09-19 DIAGNOSIS — C8331 Diffuse large B-cell lymphoma, lymph nodes of head, face, and neck: Secondary | ICD-10-CM

## 2016-09-19 LAB — COMPREHENSIVE METABOLIC PANEL
ALBUMIN: 4 g/dL (ref 3.5–5.0)
ALK PHOS: 29 U/L — AB (ref 40–150)
ALT: 49 U/L (ref 0–55)
AST: 32 U/L (ref 5–34)
Anion Gap: 10 mEq/L (ref 3–11)
BUN: 13.4 mg/dL (ref 7.0–26.0)
CO2: 26 mEq/L (ref 22–29)
Calcium: 9.9 mg/dL (ref 8.4–10.4)
Chloride: 104 mEq/L (ref 98–109)
Creatinine: 0.9 mg/dL (ref 0.7–1.3)
EGFR: >90 mL/min/{1.73_m2} (ref >90)
GLUCOSE: 95 mg/dL (ref 70–140)
POTASSIUM: 4 meq/L (ref 3.5–5.1)
SODIUM: 140 meq/L (ref 136–145)
Total Bilirubin: 1.07 mg/dL (ref 0.20–1.20)
Total Protein: 6.8 g/dL (ref 6.4–8.3)

## 2016-09-19 LAB — CBC WITH DIFFERENTIAL/PLATELET
BASO%: 1.5 % (ref 0.0–2.0)
BASOS ABS: 0.1 10*3/uL (ref 0.0–0.1)
EOS%: 1.8 % (ref 0.0–7.0)
Eosinophils Absolute: 0.1 10*3/uL (ref 0.0–0.5)
HCT: 43.6 % (ref 38.4–49.9)
HEMOGLOBIN: 14.6 g/dL (ref 13.0–17.1)
LYMPH%: 16.7 % (ref 14.0–49.0)
MCH: 29.7 pg (ref 27.2–33.4)
MCHC: 33.6 g/dL (ref 32.0–36.0)
MCV: 88.4 fL (ref 79.3–98.0)
MONO#: 0.8 10*3/uL (ref 0.1–0.9)
MONO%: 19.1 % — AB (ref 0.0–14.0)
NEUT%: 60.9 % (ref 39.0–75.0)
NEUTROS ABS: 2.6 10*3/uL (ref 1.5–6.5)
Platelets: 238 10*3/uL (ref 140–400)
RBC: 4.93 10*6/uL (ref 4.20–5.82)
RDW: 16 % — AB (ref 11.0–14.6)
WBC: 4.3 10*3/uL (ref 4.0–10.3)
lymph#: 0.7 10*3/uL — ABNORMAL LOW (ref 0.9–3.3)

## 2016-09-19 LAB — LACTATE DEHYDROGENASE: LDH: 241 U/L (ref 125–245)

## 2016-09-19 MED ORDER — SODIUM CHLORIDE 0.9 % IV SOLN
Freq: Once | INTRAVENOUS | Status: AC
  Administered 2016-09-19: 15:00:00 via INTRAVENOUS

## 2016-09-19 MED ORDER — RIVAROXABAN 20 MG PO TABS: 20.0000 mg | ORAL_TABLET | Freq: Every day | ORAL | 3 refills | Status: DC

## 2016-09-19 MED ORDER — SODIUM CHLORIDE 0.9 % IV SOLN
150.0000 mg | Freq: Once | INTRAVENOUS | Status: AC
  Administered 2016-09-19: 150 mg via INTRAVENOUS
  Filled 2016-09-19: qty 30

## 2016-09-19 MED ORDER — ACETAMINOPHEN 325 MG PO TABS
650.0000 mg | ORAL_TABLET | Freq: Once | ORAL | Status: AC
  Administered 2016-09-19: 650 mg via ORAL

## 2016-09-19 MED ORDER — DIPHENHYDRAMINE HCL 50 MG/ML IJ SOLN
INTRAMUSCULAR | Status: AC
  Filled 2016-09-19: qty 1

## 2016-09-19 MED ORDER — DIPHENHYDRAMINE HCL 50 MG/ML IJ SOLN
25.0000 mg | Freq: Once | INTRAMUSCULAR | Status: AC
  Administered 2016-09-19: 25 mg via INTRAVENOUS

## 2016-09-19 MED ORDER — ACETAMINOPHEN 325 MG PO TABS
ORAL_TABLET | ORAL | Status: AC
  Filled 2016-09-19: qty 2

## 2016-09-19 NOTE — Progress Notes (Signed)
[facility] OFFICE PROGRESS NOTE   Diagnosis:  T-cell lymphoma  INTERVAL HISTORY:   [name] returns as scheduled. He completed a treatment with brentuximab 08/29/2016. He feels well. No fevers or sweats. No nausea or vomiting. Last week he developed a "heat rash" on his legs. This has resolved. Last week he also noted bilateral ankle edema. This occurred when he was on his feet a lot and traveling. He denies shortness of breath. No neuropathy symptoms. No left leg pain. Stable numbness at the left heel.  He noted excessive sleepiness following Benadryl premedication with brentuximab 3 weeks ago.  Objective:  Vital signs in last 24 hours:  Blood pressure 135/82, pulse (!) 56, temperature 98.8 F (37.1 C), temperature source Oral, resp. rate 18, height 5\' 8"  (1.727 m), weight 199 lb 9.6 oz (90.5 kg), SpO2 97 %.    HEENT: No thrush or ulcers. Lymphatics: No palpable cervical, supraclavicular or axillary lymph nodes. Resp: Lungs clear bilaterally. Cardio: Regular rate and rhythm. No JVD. GI: Abdomen soft and nontender. No hepatosplenomegaly. No apparent ascites. No mass. Vascular: Trace edema at the lower legs/feet bilaterally. Calves nontender.  Skin: No rash. Port-A-Cath without erythema.    Lab Results:  Lab Results  Component Value Date   WBC 4.3 09/19/2016   HGB 14.6 09/19/2016   HCT 43.6 09/19/2016   MCV 88.4 09/19/2016   PLT 238 09/19/2016   NEUTROABS 2.6 09/19/2016    Imaging:  No results found.  Medications: I have reviewed the patient's current medications.  Assessment/Plan: 1. 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  PET scan 03/20/2016 with extensive bulky hypermetabolic nodal activity in the neck, chest, abdomen and pelvis; mild splenomegaly with diffusely mildly accentuated splenic activity  Staging bone marrow biopsy 03/23/2016-negative for involvement with lymphoma  Cycle 1 EPOCH beginning 03/23/2016  Cycle 2 [facility] 04/13/2016  Restaging PET scan at [facility] 05/08/2016-lymph nodes with various degrees of FDG activity in the neck, axilla, right written him, mesentery, pelvis, and groin. Indeterminate liver lesions without FDG activity, nodular mass in the posterior medial aspect of the left thigh  Cycle 3 EPOCH04/27/2018  Cycle 1 CVP 06/21/2016  Cytoxan/prednisone plus brentuximab 07/17/2016  Cytoxan/prednisone plus brentuximab 08/07/2016 (dose reduced)  Initiation of every three-week brentuximab 08/29/2016 2. 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 He entered clinical remission following chemotherapy and remained in remission when he was seen at the cancer center 02/24/2009. 3. 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 Staging PET scan 08/23/2010 with increased FDG activity at the left tonsillar fossa and no additional evidence of lymphoma. He completed 4 cycles of R-ICE with cycle #1 beginning on 09/19/2010 and cycle #4 on 11/21/2010. Repeat head and neck examination by Dr. 11/23/2010 following R-ICE/rituximab showed no residual lymphoma. He began radiation consolidation on 12/18/2010, radiation was completed on 01/22/2011. 4. History of neutropenia secondary to rituximab. 5. Anemia secondary to chemotherapy, status post a red blood cell transfusion 11/30/2010. The hemoglobin has  normalized. 6. History of  mild thrombocytopenia secondary to chemotherapy. 7. Hypertension. 8. 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 05/08/2017-nodular mass in the left thigh adjacent to the femur  MRI 06/07/2016-infiltrative mass in the left thigh adductor muscle, separate from the schwannoma excision site 9. Port-A-Cath placement 03/21/2016 10. Superficial thrombus left greater saphenous vein 04/13/2016. Lovenox initiated, converted to Xarelto beginning 04/18/2016    Disposition: [name] appears stable. He remains in clinical remission from [name]. Plan to continue brentuximab every 3 weeks. We will reduce the Benadryl premedication from 50 mg to 25 mg.  He will have an MRI of the left thigh to follow-up the thigh mass prior to his next visit in 3 weeks.  For the leg edema he will try elevation and support stockings. He will contact the office if the edema worsens.  He will return for a follow-up visit and brentuximab in 3 weeks. He will contact the office in the interim with any problems.  Plan reviewed with Dr. [name].    [name] ANP/GNP-BC   09/19/2016  2:34 PM

## 2016-09-19 NOTE — Patient Instructions (Signed)
[facility] Discharge Instructions for Patients Receiving Chemotherapy  Today you received the following chemotherapy agents Adcetris To help prevent nausea and vomiting after your treatment, we encourage you to take your nausea medication as prescribed.    If you develop nausea and vomiting that is not controlled by your nausea medication, call the clinic.   BELOW ARE SYMPTOMS THAT SHOULD BE REPORTED IMMEDIATELY:  *FEVER GREATER THAN 100.5 F  *CHILLS WITH OR WITHOUT FEVER  NAUSEA AND VOMITING THAT IS NOT CONTROLLED WITH YOUR NAUSEA MEDICATION  *UNUSUAL SHORTNESS OF BREATH  *UNUSUAL BRUISING OR BLEEDING  TENDERNESS IN MOUTH AND THROAT WITH OR WITHOUT PRESENCE OF ULCERS  *URINARY PROBLEMS  *BOWEL PROBLEMS  UNUSUAL RASH Items with * indicate a potential emergency and should be followed up as soon as possible.  Feel free to call the clinic you have any questions or concerns. The clinic phone number is [phone].  Please show the CHEMO ALERT CARD at check-in to the Emergency Department and triage nurse.   

## 2016-09-19 NOTE — Telephone Encounter (Signed)
Printed avs and calender for up 9/19. Per los

## 2016-10-07 ENCOUNTER — Other Ambulatory Visit: Payer: Self-pay | Admitting: Oncology

## 2016-10-08 ENCOUNTER — Ambulatory Visit (HOSPITAL_COMMUNITY)
Admission: RE | Admit: 2016-10-08 | Discharge: 2016-10-08 | Disposition: A | Discharge status: Home / Self Care | Payer: 59 | Source: Ambulatory Visit | Attending: Nurse Practitioner | Admitting: Nurse Practitioner

## 2016-10-08 DIAGNOSIS — R600 Localized edema: Secondary | ICD-10-CM | POA: Diagnosis not present

## 2016-10-08 DIAGNOSIS — C8590 Non-Hodgkin lymphoma, unspecified, unspecified site: Secondary | ICD-10-CM

## 2016-10-08 MED ORDER — GADOBENATE DIMEGLUMINE 529 MG/ML IV SOLN
20.0000 mL | Freq: Once | INTRAVENOUS | Status: AC | PRN
  Administered 2016-10-08: 18 mL via INTRAVENOUS

## 2016-10-10 ENCOUNTER — Other Ambulatory Visit: Payer: Self-pay

## 2016-10-10 ENCOUNTER — Ambulatory Visit (HOSPITAL_BASED_OUTPATIENT_CLINIC_OR_DEPARTMENT_OTHER): Payer: 59 | Admitting: Oncology

## 2016-10-10 ENCOUNTER — Other Ambulatory Visit (HOSPITAL_BASED_OUTPATIENT_CLINIC_OR_DEPARTMENT_OTHER): Payer: 59

## 2016-10-10 ENCOUNTER — Ambulatory Visit: Payer: 59

## 2016-10-10 ENCOUNTER — Ambulatory Visit (HOSPITAL_BASED_OUTPATIENT_CLINIC_OR_DEPARTMENT_OTHER): Payer: 59

## 2016-10-10 VITALS — BP 142/79 | HR 61 | Temp 98.6°F | Resp 18 | Ht 68.0 in | Wt 195.2 lb

## 2016-10-10 DIAGNOSIS — Z5112 Encounter for antineoplastic immunotherapy: Secondary | ICD-10-CM

## 2016-10-10 DIAGNOSIS — C8590 Non-Hodgkin lymphoma, unspecified, unspecified site: Secondary | ICD-10-CM

## 2016-10-10 DIAGNOSIS — G4700 Insomnia, unspecified: Secondary | ICD-10-CM | POA: Diagnosis not present

## 2016-10-10 DIAGNOSIS — Z7901 Long term (current) use of anticoagulants: Secondary | ICD-10-CM | POA: Diagnosis not present

## 2016-10-10 DIAGNOSIS — C866 Primary cutaneous CD30-positive T-cell proliferations: Secondary | ICD-10-CM

## 2016-10-10 DIAGNOSIS — G620 Drug-induced polyneuropathy: Secondary | ICD-10-CM

## 2016-10-10 DIAGNOSIS — Z86718 Personal history of other venous thrombosis and embolism: Secondary | ICD-10-CM

## 2016-10-10 DIAGNOSIS — Z23 Encounter for immunization: Secondary | ICD-10-CM

## 2016-10-10 DIAGNOSIS — Z95828 Presence of other vascular implants and grafts: Secondary | ICD-10-CM

## 2016-10-10 DIAGNOSIS — C8331 Diffuse large B-cell lymphoma, lymph nodes of head, face, and neck: Secondary | ICD-10-CM

## 2016-10-10 LAB — COMPREHENSIVE METABOLIC PANEL
ALBUMIN: 3.8 g/dL (ref 3.5–5.0)
ALT: 43 U/L (ref 0–55)
AST: 31 U/L (ref 5–34)
Alkaline Phosphatase: 28 U/L — ABNORMAL LOW (ref 40–150)
Anion Gap: 10 mEq/L (ref 3–11)
BUN: 13.9 mg/dL (ref 7.0–26.0)
CHLORIDE: 104 meq/L (ref 98–109)
CO2: 26 mEq/L (ref 22–29)
Calcium: 9.6 mg/dL (ref 8.4–10.4)
Creatinine: 0.9 mg/dL (ref 0.7–1.3)
GLUCOSE: 133 mg/dL (ref 70–140)
POTASSIUM: 3.8 meq/L (ref 3.5–5.1)
SODIUM: 140 meq/L (ref 136–145)
Total Bilirubin: 1 mg/dL (ref 0.20–1.20)
Total Protein: 6.6 g/dL (ref 6.4–8.3)

## 2016-10-10 LAB — CBC WITH DIFFERENTIAL/PLATELET
BASO%: 0.7 % (ref 0.0–2.0)
BASOS ABS: 0 10*3/uL (ref 0.0–0.1)
EOS ABS: 0.1 10*3/uL (ref 0.0–0.5)
EOS%: 1.7 % (ref 0.0–7.0)
HCT: 42.5 % (ref 38.4–49.9)
HGB: 14.6 g/dL (ref 13.0–17.1)
LYMPH%: 24.2 % (ref 14.0–49.0)
MCH: 30.3 pg (ref 27.2–33.4)
MCHC: 34.4 g/dL (ref 32.0–36.0)
MCV: 88.2 fL (ref 79.3–98.0)
MONO#: 0.1 10*3/uL (ref 0.1–0.9)
MONO%: 2.5 % (ref 0.0–14.0)
NEUT#: 2.8 10*3/uL (ref 1.5–6.5)
NEUT%: 70.9 % (ref 39.0–75.0)
Platelets: 225 10*3/uL (ref 140–400)
RBC: 4.82 10*6/uL (ref 4.20–5.82)
RDW: 15.1 % — ABNORMAL HIGH (ref 11.0–14.6)
WBC: 4 10*3/uL (ref 4.0–10.3)
lymph#: 1 10*3/uL (ref 0.9–3.3)

## 2016-10-10 LAB — LACTATE DEHYDROGENASE: LDH: 233 U/L (ref 125–245)

## 2016-10-10 MED ORDER — HEPARIN SOD (PORK) LOCK FLUSH 100 UNIT/ML IV SOLN
500.0000 [IU] | Freq: Once | INTRAVENOUS | Status: AC | PRN
  Administered 2016-10-10: 500 [IU]
  Filled 2016-10-10: qty 5

## 2016-10-10 MED ORDER — SODIUM CHLORIDE 0.9% FLUSH
10.0000 mL | INTRAVENOUS | Status: DC | PRN
  Administered 2016-10-10: 10 mL
  Filled 2016-10-10: qty 10

## 2016-10-10 MED ORDER — SODIUM CHLORIDE 0.9% FLUSH
10.0000 mL | Freq: Once | INTRAVENOUS | Status: AC
  Administered 2016-10-10: 10 mL
  Filled 2016-10-10: qty 10

## 2016-10-10 MED ORDER — DIPHENHYDRAMINE HCL 50 MG/ML IJ SOLN
25.0000 mg | Freq: Once | INTRAMUSCULAR | Status: AC
  Administered 2016-10-10: 25 mg via INTRAVENOUS

## 2016-10-10 MED ORDER — BRENTUXIMAB VEDOTIN 50 MG IV SOLR
1.7500 mg/kg | Freq: Once | INTRAVENOUS | Status: AC
  Administered 2016-10-10: 150 mg via INTRAVENOUS
  Filled 2016-10-10: qty 30

## 2016-10-10 MED ORDER — ACETAMINOPHEN 325 MG PO TABS
ORAL_TABLET | ORAL | Status: AC
  Filled 2016-10-10: qty 2

## 2016-10-10 MED ORDER — DIPHENHYDRAMINE HCL 50 MG/ML IJ SOLN
INTRAMUSCULAR | Status: AC
  Filled 2016-10-10: qty 1

## 2016-10-10 MED ORDER — ACETAMINOPHEN 325 MG PO TABS
650.0000 mg | ORAL_TABLET | Freq: Once | ORAL | Status: AC
  Administered 2016-10-10: 650 mg via ORAL

## 2016-10-10 MED ORDER — INFLUENZA VAC SPLIT QUAD 0.5 ML IM SUSY
0.5000 mL | PREFILLED_SYRINGE | Freq: Once | INTRAMUSCULAR | Status: AC
  Administered 2016-10-10: 0.5 mL via INTRAMUSCULAR
  Filled 2016-10-10: qty 0.5

## 2016-10-10 MED ORDER — SODIUM CHLORIDE 0.9 % IV SOLN
Freq: Once | INTRAVENOUS | Status: AC
  Administered 2016-10-10: 12:00:00 via INTRAVENOUS

## 2016-10-10 MED ORDER — ZOLPIDEM TARTRATE 5 MG PO TABS: 5.0000 mg | ORAL_TABLET | Freq: Every evening | ORAL | 2 refills | Status: DC | PRN

## 2016-10-10 NOTE — Patient Instructions (Signed)
[facility] Discharge Instructions for Patients Receiving Chemotherapy  Today you received the following chemotherapy agents Adcetris.  To help prevent nausea and vomiting after your treatment, we encourage you to take your nausea medication as directed.    If you develop nausea and vomiting that is not controlled by your nausea medication, call the clinic.   BELOW ARE SYMPTOMS THAT SHOULD BE REPORTED IMMEDIATELY:  *FEVER GREATER THAN 100.5 F  *CHILLS WITH OR WITHOUT FEVER  NAUSEA AND VOMITING THAT IS NOT CONTROLLED WITH YOUR NAUSEA MEDICATION  *UNUSUAL SHORTNESS OF BREATH  *UNUSUAL BRUISING OR BLEEDING  TENDERNESS IN MOUTH AND THROAT WITH OR WITHOUT PRESENCE OF ULCERS  *URINARY PROBLEMS  *BOWEL PROBLEMS  UNUSUAL RASH Items with * indicate a potential emergency and should be followed up as soon as possible.  Feel free to call the clinic you have any questions or concerns. The clinic phone number is [phone].  Please show the CHEMO ALERT CARD at check-in to the Emergency Department and triage nurse.    

## 2016-10-10 NOTE — Progress Notes (Signed)
[facility] OFFICE PROGRESS NOTE   Diagnosis: T-cell lymphoma  INTERVAL HISTORY:   [name] returns as scheduled.Marland Kitchen He completed another treatment with Brentuximab on 09/19/2016. No symptom of allergic reaction. He has noted increased numbness in the fingers and toes. This does not interfere with activity. He continues to have numbness at the left heel. No fever or night sweats. No palpable lymph nodes. He is working. He complains of insomnia.  Objective:  Vital signs in last 24 hours:  Blood pressure (!) 142/79, pulse 61, temperature 98.6 F (37 C), temperature source Oral, resp. rate 18, height '5\' 8"'$  (1.727 m), weight 195 lb 3.2 oz (88.5 kg), SpO2 98 %.    HEENT: No thrush or ulcers Lymphatics: No cervical, supraclavicular, or axillary nodes Resp: Lungs clear bilaterally Cardio: Regular rate and rhythm GI: No hepatosplenomegaly Vascular: No leg edema Neuro: Moderate loss of vibratory sense at the fingertips bilaterally     Portacath/PICC-without erythema  Lab Results:  Lab Results  Component Value Date   WBC 4.0 10/10/2016   HGB 14.6 10/10/2016   HCT 42.5 10/10/2016   MCV 88.2 10/10/2016   PLT 225 10/10/2016   NEUTROABS 2.8 10/10/2016    CMP     Component Value Date/Time   NA 140 10/10/2016 1027   K 3.8 10/10/2016 1027   CL 106 05/18/2016 1250   CO2 26 10/10/2016 1027   GLUCOSE 133 10/10/2016 1027   BUN 13.9 10/10/2016 1027   CREATININE 0.9 10/10/2016 1027   CALCIUM 9.6 10/10/2016 1027   PROT 6.6 10/10/2016 1027   ALBUMIN 3.8 10/10/2016 1027   AST 31 10/10/2016 1027   ALT 43 10/10/2016 1027   ALKPHOS 28 (L) 10/10/2016 1027   BILITOT 1.00 10/10/2016 1027   GFRNONAA >60 05/18/2016 1250   GFRAA >60 05/18/2016 1250      Imaging:  Mr Femur Left W Wo Contrast  Result Date: 10/08/2016 CLINICAL DATA:  History of T-cell lymphoma diagnosed in biopsy of right cervical adenopathy 03/14/2016. Patient diagnosed with an infiltrative mass in the  left thigh adductor muscle 06/07/2016. Subsequent encounter. EXAM: MR OF THE LEFT LOWER EXTREMITY WITHOUT AND WITH CONTRAST TECHNIQUE: Multiplanar, multisequence MR imaging of the left lower leg was performed both before and after administration of intravenous contrast. CONTRAST:  18 ml MULTIHANCE GADOBENATE DIMEGLUMINE 529 MG/ML IV SOLN COMPARISON:  MRI left lower leg 06/06/2016. FINDINGS: Bones/Joint/Cartilage The axial and coronal sequences incidentally include the left lower leg. Marrow signal is normal throughout without fracture, stress change or focal lesion. No avascular necrosis of the femoral heads. Ligaments Negative. Muscles and Tendons A faint area of increased T2 signal and postcontrast enhancement in the left adductor magnus measures approximately 2.0 x 1.8 cm in the axial plane compared to the mass measuring 3.0 x 5.2 cm in the axial plane on the prior examination. No new lesion is identified. Mild thickening of the sciatic nerve at the site of resection of schwannoma is noted. The right thigh is unremarkable. No new mass or fluid collection is seen. Soft tissues Negative. IMPRESSION: Marked improvement in the appearance of the left adductor magnus with only a small area of faint residual edema and enhancement. No discrete mass is seen. No new abnormality since the prior exam. Electronically Signed   By: Inge Rise M.D.   On: 10/08/2016 09:37    Medications: I have reviewed the patient's current medications.  Assessment/Plan: 1. 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  PET scan 03/20/2016  with extensive bulky hypermetabolic nodal activity in the neck, chest, abdomen and pelvis; mild splenomegaly with diffusely mildly accentuated splenic activity  Staging bone marrow biopsy 03/23/2016-negative for involvement with lymphoma  Cycle 1 EPOCH beginning 03/23/2016  Cycle 2 [facility] 04/13/2016  Restaging PET scan at [facility] 05/08/2016-lymph nodes with various degrees of FDG activity in the neck, axilla, right written him, mesentery, pelvis, and groin. Indeterminate liver lesions without FDG activity, nodular mass in the posterior medial aspect of the left thigh  Cycle 3 EPOCH04/27/2018  Cycle 1 CVP 06/21/2016  Cytoxan/prednisone plus brentuximab 07/17/2016  Cytoxan/prednisone plus brentuximab 08/07/2016 (dose reduced)  Initiation of every three-week brentuximab08/08/2016 2. 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 He entered clinical remission following chemotherapy and remained in remission when he was seen at the cancer center 02/24/2009. 3. 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 Staging PET scan 08/23/2010 with increased FDG activity at the left tonsillar fossa and no additional evidence of lymphoma. He completed 4 cycles of R-ICE with cycle #1 beginning on 09/19/2010 and cycle #4 on 11/21/2010. Repeat head and neck examination by Dr. Constance Holster following R-ICE/rituximab showed no residual lymphoma. He began radiation consolidation on 12/18/2010, radiation was completed on 01/22/2011. 4. History of neutropenia secondary to rituximab. 5. Anemia secondary to chemotherapy, status post a red blood cell transfusion 11/30/2010. The hemoglobin has normalized. 6. History of mild thrombocytopenia secondary to chemotherapy. 7. Hypertension. 8. 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 05/08/2017-nodular mass in the left thigh adjacent to the femur  MRI 06/07/2016-infiltrative mass in the left thigh adductor muscle, separate from the schwannoma excision site  MRI 10/08/2016-marked improvement in the left adductor Magnus mass with a small area of enhancement remaining, no discrete mass 9. Port-A-Cath placement 03/21/2016 10. Superficial thrombus left greater saphenous vein 04/13/2016. Lovenox initiated, converted to Xarelto beginning 04/18/2016 11. 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 Slatter remains in clinical remission from non-Hodgkin's lymphoma. The left thigh mass is much improved, potentially indicating a response to treatment of lymphoma.  He has developed mild neuropathy symptoms. This is likely secondary to brentuximab. We will stop the brentuximab if the neuropathy symptoms progress.  Mr. Marken will receive an influenza vaccine today. He will return for an office visit in 3 weeks.  He will try Ambien for insomnia. He knows to not take Ambien and lorazepam together.     Don Romberg, MD  10/10/2016  11:50 AM

## 2016-10-11 ENCOUNTER — Telehealth: Payer: Self-pay | Admitting: Oncology

## 2016-10-11 NOTE — Telephone Encounter (Signed)
Left message for patient regarding upcoming October appointments.  °

## 2016-10-26 ENCOUNTER — Other Ambulatory Visit: Payer: Self-pay | Admitting: Oncology

## 2016-10-31 ENCOUNTER — Ambulatory Visit (HOSPITAL_BASED_OUTPATIENT_CLINIC_OR_DEPARTMENT_OTHER): Payer: 59 | Admitting: Oncology

## 2016-10-31 ENCOUNTER — Ambulatory Visit: Payer: 59

## 2016-10-31 ENCOUNTER — Telehealth: Payer: Self-pay | Admitting: Oncology

## 2016-10-31 ENCOUNTER — Ambulatory Visit (HOSPITAL_BASED_OUTPATIENT_CLINIC_OR_DEPARTMENT_OTHER): Payer: 59

## 2016-10-31 ENCOUNTER — Other Ambulatory Visit (HOSPITAL_BASED_OUTPATIENT_CLINIC_OR_DEPARTMENT_OTHER): Payer: 59

## 2016-10-31 VITALS — BP 136/79 | HR 65 | Temp 98.6°F | Resp 19 | Ht 68.0 in | Wt 197.8 lb

## 2016-10-31 DIAGNOSIS — C8590 Non-Hodgkin lymphoma, unspecified, unspecified site: Secondary | ICD-10-CM

## 2016-10-31 DIAGNOSIS — C866 Primary cutaneous CD30-positive T-cell proliferations: Secondary | ICD-10-CM

## 2016-10-31 DIAGNOSIS — Z95828 Presence of other vascular implants and grafts: Secondary | ICD-10-CM

## 2016-10-31 DIAGNOSIS — G620 Drug-induced polyneuropathy: Secondary | ICD-10-CM

## 2016-10-31 DIAGNOSIS — C8331 Diffuse large B-cell lymphoma, lymph nodes of head, face, and neck: Secondary | ICD-10-CM

## 2016-10-31 DIAGNOSIS — Z5112 Encounter for antineoplastic immunotherapy: Secondary | ICD-10-CM

## 2016-10-31 LAB — CBC WITH DIFFERENTIAL/PLATELET
BASO%: 1.1 % (ref 0.0–2.0)
Basophils Absolute: 0.1 10*3/uL (ref 0.0–0.1)
EOS%: 4 % (ref 0.0–7.0)
Eosinophils Absolute: 0.2 10*3/uL (ref 0.0–0.5)
HEMATOCRIT: 40 % (ref 38.4–49.9)
HEMOGLOBIN: 13.5 g/dL (ref 13.0–17.1)
LYMPH#: 1.2 10*3/uL (ref 0.9–3.3)
LYMPH%: 26.1 % (ref 14.0–49.0)
MCH: 30.2 pg (ref 27.2–33.4)
MCHC: 33.8 g/dL (ref 32.0–36.0)
MCV: 89.5 fL (ref 79.3–98.0)
MONO#: 0.4 10*3/uL (ref 0.1–0.9)
MONO%: 8 % (ref 0.0–14.0)
NEUT%: 60.8 % (ref 39.0–75.0)
NEUTROS ABS: 2.7 10*3/uL (ref 1.5–6.5)
PLATELETS: 227 10*3/uL (ref 140–400)
RBC: 4.47 10*6/uL (ref 4.20–5.82)
RDW: 15.1 % — ABNORMAL HIGH (ref 11.0–14.6)
WBC: 4.5 10*3/uL (ref 4.0–10.3)

## 2016-10-31 LAB — COMPREHENSIVE METABOLIC PANEL
ALBUMIN: 3.7 g/dL (ref 3.5–5.0)
ALT: 53 U/L (ref 0–55)
ANION GAP: 9 meq/L (ref 3–11)
AST: 35 U/L — AB (ref 5–34)
Alkaline Phosphatase: 29 U/L — ABNORMAL LOW (ref 40–150)
BILIRUBIN TOTAL: 0.63 mg/dL (ref 0.20–1.20)
BUN: 10.7 mg/dL (ref 7.0–26.0)
CALCIUM: 9 mg/dL (ref 8.4–10.4)
CO2: 25 mEq/L (ref 22–29)
CREATININE: 1 mg/dL (ref 0.7–1.3)
Chloride: 107 mEq/L (ref 98–109)
EGFR: >60 mL/min/{1.73_m2} (ref >60)
Glucose: 75 mg/dl (ref 70–140)
Potassium: 4.1 mEq/L (ref 3.5–5.1)
Sodium: 141 mEq/L (ref 136–145)
Total Protein: 6.3 g/dL — ABNORMAL LOW (ref 6.4–8.3)

## 2016-10-31 LAB — LACTATE DEHYDROGENASE: LDH: 242 U/L (ref 125–245)

## 2016-10-31 LAB — TECHNOLOGIST REVIEW

## 2016-10-31 MED ORDER — DIPHENHYDRAMINE HCL 50 MG/ML IJ SOLN
25.0000 mg | Freq: Once | INTRAMUSCULAR | Status: AC
  Administered 2016-10-31: 25 mg via INTRAVENOUS

## 2016-10-31 MED ORDER — SODIUM CHLORIDE 0.9 % IV SOLN
Freq: Once | INTRAVENOUS | Status: AC
  Administered 2016-10-31: 14:00:00 via INTRAVENOUS

## 2016-10-31 MED ORDER — BRENTUXIMAB VEDOTIN 50 MG IV SOLR
1.1800 mg/kg | Freq: Once | INTRAVENOUS | Status: AC
  Administered 2016-10-31: 100 mg via INTRAVENOUS
  Filled 2016-10-31: qty 20

## 2016-10-31 MED ORDER — HEPARIN SOD (PORK) LOCK FLUSH 100 UNIT/ML IV SOLN
500.0000 [IU] | Freq: Once | INTRAVENOUS | Status: AC | PRN
  Administered 2016-10-31: 500 [IU]
  Filled 2016-10-31: qty 5

## 2016-10-31 MED ORDER — SODIUM CHLORIDE 0.9% FLUSH
10.0000 mL | Freq: Once | INTRAVENOUS | Status: AC
  Administered 2016-10-31: 10 mL
  Filled 2016-10-31: qty 10

## 2016-10-31 MED ORDER — DIPHENHYDRAMINE HCL 50 MG/ML IJ SOLN
INTRAMUSCULAR | Status: AC
  Filled 2016-10-31: qty 1

## 2016-10-31 MED ORDER — SODIUM CHLORIDE 0.9% FLUSH
10.0000 mL | INTRAVENOUS | Status: DC | PRN
  Administered 2016-10-31: 10 mL
  Filled 2016-10-31: qty 10

## 2016-10-31 MED ORDER — ACETAMINOPHEN 325 MG PO TABS
650.0000 mg | ORAL_TABLET | Freq: Once | ORAL | Status: AC
  Administered 2016-10-31: 650 mg via ORAL

## 2016-10-31 MED ORDER — ACETAMINOPHEN 325 MG PO TABS
ORAL_TABLET | ORAL | Status: AC
  Filled 2016-10-31: qty 2

## 2016-10-31 NOTE — Patient Instructions (Signed)
[facility] Discharge Instructions for Patients Receiving Chemotherapy  Today you received the following chemotherapy agents: Adcetris  To help prevent nausea and vomiting after your treatment, we encourage you to take your nausea medication as directed.   If you develop nausea and vomiting that is not controlled by your nausea medication, call the clinic.   BELOW ARE SYMPTOMS THAT SHOULD BE REPORTED IMMEDIATELY:  *FEVER GREATER THAN 100.5 F  *CHILLS WITH OR WITHOUT FEVER  NAUSEA AND VOMITING THAT IS NOT CONTROLLED WITH YOUR NAUSEA MEDICATION  *UNUSUAL SHORTNESS OF BREATH  *UNUSUAL BRUISING OR BLEEDING  TENDERNESS IN MOUTH AND THROAT WITH OR WITHOUT PRESENCE OF ULCERS  *URINARY PROBLEMS  *BOWEL PROBLEMS  UNUSUAL RASH Items with * indicate a potential emergency and should be followed up as soon as possible.  Feel free to call the clinic should you have any questions or concerns. The clinic phone number is [phone].  Please show the CHEMO ALERT CARD at check-in to the Emergency Department and triage nurse.   

## 2016-10-31 NOTE — Progress Notes (Signed)
[facility] OFFICE PROGRESS NOTE   Diagnosis: Non-Hodgkin's lymphoma  INTERVAL HISTORY:   Ms. Maione returns as scheduled. He completed another treatment with brentuximab 10/10/2016. He feels well. No fever or night sweats. No palpable lymph nodes. He has noted a mild increase in peripheral numbness, chiefly in the fingers. He has mild difficulty turning pages in the paper. No other interference with activity. He is playing golf.  Objective:  Vital signs in last 24 hours:  Blood pressure 136/79, pulse 65, temperature 98.6 F (37 C), temperature source Oral, resp. rate 19, height '5\' 8"'$  (1.727 m), weight 197 lb 12.8 oz (89.7 kg), SpO2 99 %.    HEENT: No thrush or ulcers Lymphatics: No cervical or supra-clavicular nodes Resp: Lungs clear bilaterally Cardio: Regular rate and rhythm GI: No hepatosplenomegaly Vascular: No leg edema Neuro: Moderate loss of vibratory sense at the fingertips bilaterally, he can distinguish various textures   Portacath/PICC-without erythema  Lab Results:  Lab Results  Component Value Date   WBC 4.5 10/31/2016   HGB 13.5 10/31/2016   HCT 40.0 10/31/2016   MCV 89.5 10/31/2016   PLT 227 10/31/2016   NEUTROABS 2.7 10/31/2016    CMP     Component Value Date/Time   NA 141 10/31/2016 1153   K 4.1 10/31/2016 1153   CL 106 05/18/2016 1250   CO2 25 10/31/2016 1153   GLUCOSE 75 10/31/2016 1153   BUN 10.7 10/31/2016 1153   CREATININE 1.0 10/31/2016 1153   CALCIUM 9.0 10/31/2016 1153   PROT 6.3 (L) 10/31/2016 1153   ALBUMIN 3.7 10/31/2016 1153   AST 35 (H) 10/31/2016 1153   ALT 53 10/31/2016 1153   ALKPHOS 29 (L) 10/31/2016 1153   BILITOT 0.63 10/31/2016 1153   GFRNONAA >60 05/18/2016 1250   GFRAA >60 05/18/2016 1250     Medications: I have reviewed the patient's current medications.  Assessment/Plan: 1. 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  PET scan 03/20/2016 with extensive bulky hypermetabolic nodal activity in the neck, chest, abdomen and pelvis; mild splenomegaly with diffusely mildly accentuated splenic activity  Staging bone marrow biopsy 03/23/2016-negative for involvement with lymphoma  Cycle 1 EPOCH beginning 03/23/2016  Cycle 2 [facility] 04/13/2016  Restaging PET scan at [facility] 05/08/2016-lymph nodes with various degrees of FDG activity in the neck, axilla, right written him, mesentery, pelvis, and groin. Indeterminate liver lesions without FDG activity, nodular mass in the posterior medial aspect of the left thigh  Cycle 3 EPOCH04/27/2018  Cycle 1 CVP 06/21/2016  Cytoxan/prednisone plus brentuximab 07/17/2016  Cytoxan/prednisone plus brentuximab 08/07/2016 (dose reduced)  Initiation of every three-week brentuximab08/08/2016  brentuximab dose reduced 10/31/2016 secondary to neuropathy 2. 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 He entered clinical remission following chemotherapy and remained in remission when he was seen at the cancer center 02/24/2009. 3. 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 Staging PET scan 08/23/2010 with increased FDG activity at the left tonsillar fossa and no additional evidence of lymphoma. He completed  4 cycles of R-ICE with cycle #1 beginning on 09/19/2010 and cycle #4 on 11/21/2010. Repeat head and neck examination by Dr. Constance Holster  following R-ICE/rituximab showed no residual lymphoma. He began radiation consolidation on 12/18/2010, radiation was completed on 01/22/2011. 4. History of neutropenia secondary to rituximab. 5. Anemia secondary to chemotherapy, status post a red blood cell transfusion 11/30/2010. The hemoglobin has normalized. 6. History of mild thrombocytopenia secondary to chemotherapy. 7. Hypertension. 8. 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 05/08/2017-nodular mass in the left thigh adjacent to the femur  MRI 06/07/2016-infiltrative mass in the left thigh adductor muscle, separate from the schwannoma excision site  MRI 10/08/2016-marked improvement in the left adductor Magnus mass with a small area of enhancement remaining, no discrete mass 9. Port-A-Cath placement 03/21/2016 10. Superficial thrombus left greater saphenous vein 04/13/2016. Lovenox initiated, converted to Xarelto beginning 04/18/2016 11. 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 Mattioli remains in clinical remission from non-Hodgkin's follow-up. He is tolerating the brentuximab well other than development of mild neuropathy. The neuropathy causes minimal interference with activity at present. We discussed a treatment break versus proceeding with a dose reduction of the brentuximab. He understands the neuropathy is reversible in some patients. He agrees to proceed with treatment at a dose reduction.  Mr. Augello will return for an office visit in 3 weeks.  Donneta Romberg, MD  10/31/2016  1:08 PM

## 2016-10-31 NOTE — Telephone Encounter (Signed)
Gave patient avs report and appointments for October and November.  °

## 2016-11-17 ENCOUNTER — Other Ambulatory Visit: Payer: Self-pay | Admitting: Oncology

## 2016-11-21 ENCOUNTER — Ambulatory Visit (HOSPITAL_BASED_OUTPATIENT_CLINIC_OR_DEPARTMENT_OTHER): Payer: 59 | Admitting: Nurse Practitioner

## 2016-11-21 ENCOUNTER — Telehealth: Payer: Self-pay | Admitting: Nurse Practitioner

## 2016-11-21 ENCOUNTER — Ambulatory Visit (HOSPITAL_BASED_OUTPATIENT_CLINIC_OR_DEPARTMENT_OTHER): Payer: 59

## 2016-11-21 ENCOUNTER — Ambulatory Visit: Payer: 59

## 2016-11-21 ENCOUNTER — Other Ambulatory Visit (HOSPITAL_BASED_OUTPATIENT_CLINIC_OR_DEPARTMENT_OTHER): Payer: 59

## 2016-11-21 VITALS — BP 134/84 | HR 62 | Temp 98.8°F | Resp 18 | Ht 68.0 in | Wt 197.9 lb

## 2016-11-21 DIAGNOSIS — C866 Primary cutaneous CD30-positive T-cell proliferations: Secondary | ICD-10-CM | POA: Diagnosis not present

## 2016-11-21 DIAGNOSIS — C8590 Non-Hodgkin lymphoma, unspecified, unspecified site: Secondary | ICD-10-CM

## 2016-11-21 DIAGNOSIS — Z5112 Encounter for antineoplastic immunotherapy: Secondary | ICD-10-CM | POA: Diagnosis not present

## 2016-11-21 DIAGNOSIS — C8331 Diffuse large B-cell lymphoma, lymph nodes of head, face, and neck: Secondary | ICD-10-CM

## 2016-11-21 DIAGNOSIS — G620 Drug-induced polyneuropathy: Secondary | ICD-10-CM | POA: Diagnosis not present

## 2016-11-21 LAB — COMPREHENSIVE METABOLIC PANEL
ALK PHOS: 30 U/L — AB (ref 40–150)
ALT: 35 U/L (ref 0–55)
AST: 28 U/L (ref 5–34)
Albumin: 3.8 g/dL (ref 3.5–5.0)
Anion Gap: 9 mEq/L (ref 3–11)
BILIRUBIN TOTAL: 0.88 mg/dL (ref 0.20–1.20)
BUN: 16.9 mg/dL (ref 7.0–26.0)
CO2: 24 meq/L (ref 22–29)
Calcium: 9.2 mg/dL (ref 8.4–10.4)
Chloride: 105 mEq/L (ref 98–109)
Creatinine: 0.9 mg/dL (ref 0.7–1.3)
GLUCOSE: 104 mg/dL (ref 70–140)
POTASSIUM: 4.2 meq/L (ref 3.5–5.1)
SODIUM: 138 meq/L (ref 136–145)
TOTAL PROTEIN: 6.5 g/dL (ref 6.4–8.3)

## 2016-11-21 LAB — CBC WITH DIFFERENTIAL/PLATELET
BASO%: 1 % (ref 0.0–2.0)
BASOS ABS: 0 10*3/uL (ref 0.0–0.1)
EOS ABS: 0.3 10*3/uL (ref 0.0–0.5)
EOS%: 7.1 % — ABNORMAL HIGH (ref 0.0–7.0)
HCT: 41.4 % (ref 38.4–49.9)
HGB: 14.2 g/dL (ref 13.0–17.1)
LYMPH%: 9.3 % — AB (ref 14.0–49.0)
MCH: 30.5 pg (ref 27.2–33.4)
MCHC: 34.3 g/dL (ref 32.0–36.0)
MCV: 89 fL (ref 79.3–98.0)
MONO#: 0.6 10*3/uL (ref 0.1–0.9)
MONO%: 11.9 % (ref 0.0–14.0)
NEUT%: 70.7 % (ref 39.0–75.0)
NEUTROS ABS: 3.5 10*3/uL (ref 1.5–6.5)
PLATELETS: 210 10*3/uL (ref 140–400)
RBC: 4.65 10*6/uL (ref 4.20–5.82)
RDW: 15.7 % — AB (ref 11.0–14.6)
WBC: 4.9 10*3/uL (ref 4.0–10.3)
lymph#: 0.5 10*3/uL — ABNORMAL LOW (ref 0.9–3.3)

## 2016-11-21 MED ORDER — SODIUM CHLORIDE 0.9% FLUSH
10.0000 mL | INTRAVENOUS | Status: DC | PRN
  Administered 2016-11-21: 10 mL
  Filled 2016-11-21: qty 10

## 2016-11-21 MED ORDER — HEPARIN SOD (PORK) LOCK FLUSH 100 UNIT/ML IV SOLN
500.0000 [IU] | Freq: Once | INTRAVENOUS | Status: AC | PRN
  Administered 2016-11-21: 500 [IU]
  Filled 2016-11-21: qty 5

## 2016-11-21 MED ORDER — ACETAMINOPHEN 325 MG PO TABS
ORAL_TABLET | ORAL | Status: AC
  Filled 2016-11-21: qty 2

## 2016-11-21 MED ORDER — ACETAMINOPHEN 325 MG PO TABS
650.0000 mg | ORAL_TABLET | Freq: Once | ORAL | Status: AC
  Administered 2016-11-21: 650 mg via ORAL

## 2016-11-21 MED ORDER — SODIUM CHLORIDE 0.9 % IV SOLN
1.1500 mg/kg | Freq: Once | INTRAVENOUS | Status: AC
  Administered 2016-11-21: 100 mg via INTRAVENOUS
  Filled 2016-11-21: qty 20

## 2016-11-21 MED ORDER — DIPHENHYDRAMINE HCL 50 MG/ML IJ SOLN
25.0000 mg | Freq: Once | INTRAMUSCULAR | Status: AC
  Administered 2016-11-21: 25 mg via INTRAVENOUS

## 2016-11-21 MED ORDER — DIPHENHYDRAMINE HCL 50 MG/ML IJ SOLN
INTRAMUSCULAR | Status: AC
  Filled 2016-11-21: qty 1

## 2016-11-21 MED ORDER — RIVAROXABAN 20 MG PO TABS: 20.0000 mg | ORAL_TABLET | Freq: Every day | ORAL | 3 refills | Status: DC

## 2016-11-21 MED ORDER — SODIUM CHLORIDE 0.9 % IV SOLN
Freq: Once | INTRAVENOUS | Status: AC
  Administered 2016-11-21: 11:00:00 via INTRAVENOUS

## 2016-11-21 NOTE — Patient Instructions (Signed)
[facility] Discharge Instructions for Patients Receiving Chemotherapy  Today you received the following chemotherapy agents: Adcetris  To help prevent nausea and vomiting after your treatment, we encourage you to take your nausea medication as directed.   If you develop nausea and vomiting that is not controlled by your nausea medication, call the clinic.   BELOW ARE SYMPTOMS THAT SHOULD BE REPORTED IMMEDIATELY:  *FEVER GREATER THAN 100.5 F  *CHILLS WITH OR WITHOUT FEVER  NAUSEA AND VOMITING THAT IS NOT CONTROLLED WITH YOUR NAUSEA MEDICATION  *UNUSUAL SHORTNESS OF BREATH  *UNUSUAL BRUISING OR BLEEDING  TENDERNESS IN MOUTH AND THROAT WITH OR WITHOUT PRESENCE OF ULCERS  *URINARY PROBLEMS  *BOWEL PROBLEMS  UNUSUAL RASH Items with * indicate a potential emergency and should be followed up as soon as possible.  Feel free to call the clinic should you have any questions or concerns. The clinic phone number is [phone].  Please show the CHEMO ALERT CARD at check-in to the Emergency Department and triage nurse.   

## 2016-11-21 NOTE — Telephone Encounter (Signed)
Scheduled appt per 10/31 los - Gave patient AVS and calender per los.  

## 2016-11-21 NOTE — Telephone Encounter (Signed)
Scheduled appt per 10/31 los - patient to get new scheduled in the treatment area.

## 2016-11-21 NOTE — Progress Notes (Signed)
[facility] OFFICE PROGRESS NOTE   Diagnosis:  Non-Hodgkin's lymphoma  INTERVAL HISTORY:   [name] returns as scheduled. He completed another cycle of brentuximab 10/31/2016. He denies nausea/vomiting. No mouth sores. No diarrhea. No fevers or sweats. He has a good appetite. He notes a slight increase in the neuropathy symptoms in the fingertips and toes. This does not interfere with activity.  Objective:  Vital signs in last 24 hours:  Blood pressure 134/84, pulse 62, temperature 98.8 F (37.1 C), temperature source Oral, resp. rate 18, height 5\' 8"  (1.727 m), weight 197 lb 14.4 oz (89.8 kg), SpO2 100 %.    HEENT: no thrush or ulcers. Lymphatics: no palpable cervical, supra clavicular, axillary or inguinal lymph nodes. Resp: lungs clear bilaterally. Cardio: regular rate and rhythm. GI: no hepatomegaly. Vascular: no leg edema. Neuro: vibratory sense moderately decreased over the fingertips per tuning fork exam.  Skin: no rash. Port-A-Cath without erythema.    Lab Results:  Lab Results  Component Value Date   WBC 4.9 11/21/2016   HGB 14.2 11/21/2016   HCT 41.4 11/21/2016   MCV 89.0 11/21/2016   PLT 210 11/21/2016   NEUTROABS 3.5 11/21/2016    Imaging:  No results found.  Medications: I have reviewed the patient's current medications.  Assessment/Plan: 1. 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  PET scan 03/20/2016 with extensive bulky hypermetabolic nodal activity in the neck, chest, abdomen and pelvis; mild splenomegaly with diffusely mildly accentuated  splenic activity  Staging bone marrow biopsy 03/23/2016-negative for involvement with lymphoma  Cycle 1 EPOCH beginning 03/23/2016  Cycle 2 [facility] 04/13/2016  Restaging PET scan at [facility] 05/08/2016-lymph nodes with various degrees of FDG activity in the neck, axilla, right written him, mesentery, pelvis, and groin. Indeterminate liver lesions without FDG activity, nodular mass in the posterior medial aspect of the left thigh  Cycle 3 EPOCH04/27/2018  Cycle 1 CVP 06/21/2016  Cytoxan/prednisone plus brentuximab 07/17/2016  Cytoxan/prednisone plus brentuximab 08/07/2016 (dose reduced)  Initiation of every three-week brentuximab08/08/2016  brentuximab dose reduced 10/31/2016 secondary to neuropathy 2. 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 He entered clinical remission following chemotherapy and remained in remission when he was seen at the cancer center 02/24/2009. 3. 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 Staging PET scan 08/23/2010 with increased FDG activity at the left tonsillar fossa and no additional evidence of lymphoma. He completed 4 cycles of R-ICE with cycle #1 beginning on 09/19/2010 and cycle #4 on 11/21/2010. Repeat head and neck examination by Dr. 11/23/2010 following R-ICE/rituximab showed no residual lymphoma. He began radiation consolidation on 12/18/2010, radiation was completed on 01/22/2011. 4. History of neutropenia secondary to rituximab. 5. Anemia secondary to chemotherapy, status post a red blood cell transfusion 11/30/2010. The hemoglobin has normalized. 6. History of mild thrombocytopenia secondary to chemotherapy. 7. Hypertension. 8. 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 05/08/2017-nodular mass in the left thigh adjacent to the  femur  MRI 06/07/2016-infiltrative mass in the left thigh adductor muscle, separate from the schwannoma excision site  MRI 10/08/2016-marked improvement in the left adductor muscle mass with a small area of enhancement remaining, no discrete mass 9. Port-A-Cath placement 03/21/2016 10. Superficial thrombus left greater saphenous vein 04/13/2016. Lovenox initiated, converted to Xarelto beginning 04/18/2016 11. 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 Plan to continue brentuximab. He has mild neuropathy symptoms. There is no interference with activity at present. The brentuximab will be continued at the reduced dose. He will return for a follow-up visit and brentuximab in 3 weeks. He will contact the office in the interim with any problems.  Plan reviewed with Dr. [name].    [name] ANP/GNP-BC   11/21/2016  10:13 AM

## 2016-11-29 ENCOUNTER — Ambulatory Visit: Payer: Self-pay | Admitting: Oncology

## 2016-11-29 ENCOUNTER — Other Ambulatory Visit: Payer: Self-pay

## 2016-12-09 ENCOUNTER — Other Ambulatory Visit: Payer: Self-pay | Admitting: Oncology

## 2016-12-09 DIAGNOSIS — C8590 Non-Hodgkin lymphoma, unspecified, unspecified site: Secondary | ICD-10-CM

## 2016-12-12 ENCOUNTER — Ambulatory Visit: Payer: 59

## 2016-12-12 ENCOUNTER — Ambulatory Visit (HOSPITAL_BASED_OUTPATIENT_CLINIC_OR_DEPARTMENT_OTHER): Payer: 59

## 2016-12-12 ENCOUNTER — Other Ambulatory Visit (HOSPITAL_BASED_OUTPATIENT_CLINIC_OR_DEPARTMENT_OTHER): Payer: 59

## 2016-12-12 ENCOUNTER — Telehealth: Payer: Self-pay | Admitting: Oncology

## 2016-12-12 ENCOUNTER — Ambulatory Visit (HOSPITAL_BASED_OUTPATIENT_CLINIC_OR_DEPARTMENT_OTHER): Payer: 59 | Admitting: Oncology

## 2016-12-12 VITALS — BP 128/77 | HR 98 | Temp 99.0°F | Resp 19 | Ht 68.0 in | Wt 200.8 lb

## 2016-12-12 DIAGNOSIS — C866 Primary cutaneous CD30-positive T-cell proliferations: Secondary | ICD-10-CM

## 2016-12-12 DIAGNOSIS — C8590 Non-Hodgkin lymphoma, unspecified, unspecified site: Secondary | ICD-10-CM

## 2016-12-12 DIAGNOSIS — Z5112 Encounter for antineoplastic immunotherapy: Secondary | ICD-10-CM | POA: Diagnosis not present

## 2016-12-12 DIAGNOSIS — G620 Drug-induced polyneuropathy: Secondary | ICD-10-CM

## 2016-12-12 DIAGNOSIS — C8331 Diffuse large B-cell lymphoma, lymph nodes of head, face, and neck: Secondary | ICD-10-CM

## 2016-12-12 DIAGNOSIS — Z95828 Presence of other vascular implants and grafts: Secondary | ICD-10-CM

## 2016-12-12 LAB — COMPREHENSIVE METABOLIC PANEL
ALBUMIN: 3.8 g/dL (ref 3.5–5.0)
ALK PHOS: 30 U/L — AB (ref 40–150)
ALT: 35 U/L (ref 0–55)
AST: 28 U/L (ref 5–34)
Anion Gap: 8 mEq/L (ref 3–11)
BILIRUBIN TOTAL: 0.87 mg/dL (ref 0.20–1.20)
BUN: 11.7 mg/dL (ref 7.0–26.0)
CALCIUM: 9.1 mg/dL (ref 8.4–10.4)
CO2: 26 mEq/L (ref 22–29)
CREATININE: 0.9 mg/dL (ref 0.7–1.3)
Chloride: 105 mEq/L (ref 98–109)
EGFR: >60 mL/min/{1.73_m2} (ref >60)
Glucose: 99 mg/dl (ref 70–140)
Potassium: 4.1 mEq/L (ref 3.5–5.1)
Sodium: 139 mEq/L (ref 136–145)
TOTAL PROTEIN: 6.5 g/dL (ref 6.4–8.3)

## 2016-12-12 LAB — CBC WITH DIFFERENTIAL/PLATELET
BASO%: 1 % (ref 0.0–2.0)
Basophils Absolute: 0.1 10*3/uL (ref 0.0–0.1)
EOS ABS: 0.4 10*3/uL (ref 0.0–0.5)
EOS%: 7.5 % — AB (ref 0.0–7.0)
HCT: 42.1 % (ref 38.4–49.9)
HGB: 14.5 g/dL (ref 13.0–17.1)
LYMPH%: 9.5 % — AB (ref 14.0–49.0)
MCH: 30.6 pg (ref 27.2–33.4)
MCHC: 34.4 g/dL (ref 32.0–36.0)
MCV: 88.8 fL (ref 79.3–98.0)
MONO#: 0.6 10*3/uL (ref 0.1–0.9)
MONO%: 11.2 % (ref 0.0–14.0)
NEUT%: 70.8 % (ref 39.0–75.0)
NEUTROS ABS: 3.7 10*3/uL (ref 1.5–6.5)
Platelets: 209 10*3/uL (ref 140–400)
RBC: 4.74 10*6/uL (ref 4.20–5.82)
RDW: 14.6 % (ref 11.0–14.6)
WBC: 5.2 10*3/uL (ref 4.0–10.3)
lymph#: 0.5 10*3/uL — ABNORMAL LOW (ref 0.9–3.3)

## 2016-12-12 MED ORDER — DIPHENHYDRAMINE HCL 50 MG/ML IJ SOLN
INTRAMUSCULAR | Status: AC
  Filled 2016-12-12: qty 1

## 2016-12-12 MED ORDER — SODIUM CHLORIDE 0.9% FLUSH
10.0000 mL | INTRAVENOUS | Status: DC | PRN
  Administered 2016-12-12: 10 mL
  Filled 2016-12-12: qty 10

## 2016-12-12 MED ORDER — HEPARIN SOD (PORK) LOCK FLUSH 100 UNIT/ML IV SOLN
500.0000 [IU] | Freq: Once | INTRAVENOUS | Status: AC | PRN
  Administered 2016-12-12: 500 [IU]
  Filled 2016-12-12: qty 5

## 2016-12-12 MED ORDER — BRENTUXIMAB VEDOTIN 50 MG IV SOLR
1.1500 mg/kg | Freq: Once | INTRAVENOUS | Status: AC
  Administered 2016-12-12: 100 mg via INTRAVENOUS
  Filled 2016-12-12: qty 20

## 2016-12-12 MED ORDER — ACETAMINOPHEN 325 MG PO TABS
ORAL_TABLET | ORAL | Status: AC
  Filled 2016-12-12: qty 2

## 2016-12-12 MED ORDER — DIPHENHYDRAMINE HCL 25 MG PO CAPS
ORAL_CAPSULE | ORAL | Status: AC
  Filled 2016-12-12: qty 1

## 2016-12-12 MED ORDER — ACETAMINOPHEN 325 MG PO TABS
650.0000 mg | ORAL_TABLET | Freq: Once | ORAL | Status: AC
  Administered 2016-12-12: 650 mg via ORAL

## 2016-12-12 MED ORDER — SODIUM CHLORIDE 0.9 % IV SOLN
Freq: Once | INTRAVENOUS | Status: AC
  Administered 2016-12-12: 10:00:00 via INTRAVENOUS

## 2016-12-12 MED ORDER — DIPHENHYDRAMINE HCL 50 MG/ML IJ SOLN
25.0000 mg | Freq: Once | INTRAMUSCULAR | Status: AC
  Administered 2016-12-12: 25 mg via INTRAVENOUS

## 2016-12-12 MED ORDER — SODIUM CHLORIDE 0.9% FLUSH
10.0000 mL | Freq: Once | INTRAVENOUS | Status: AC
  Administered 2016-12-12: 10 mL
  Filled 2016-12-12: qty 10

## 2016-12-12 NOTE — Telephone Encounter (Signed)
Scheduled appt per 11/21 los - Gave patient AVS and calender per los.  

## 2016-12-12 NOTE — Patient Instructions (Signed)
[facility] Discharge Instructions for Patients Receiving Chemotherapy  Today you received the following chemotherapy agents: Brentuximab vedotin (Adentris).  To help prevent nausea and vomiting after your treatment, we encourage you to take your nausea medication as prescribed.  If you develop nausea and vomiting that is not controlled by your nausea medication, call the clinic.   BELOW ARE SYMPTOMS THAT SHOULD BE REPORTED IMMEDIATELY:  *FEVER GREATER THAN 100.5 F  *CHILLS WITH OR WITHOUT FEVER  NAUSEA AND VOMITING THAT IS NOT CONTROLLED WITH YOUR NAUSEA MEDICATION  *UNUSUAL SHORTNESS OF BREATH  *UNUSUAL BRUISING OR BLEEDING  TENDERNESS IN MOUTH AND THROAT WITH OR WITHOUT PRESENCE OF ULCERS  *URINARY PROBLEMS  *BOWEL PROBLEMS  UNUSUAL RASH Items with * indicate a potential emergency and should be followed up as soon as possible.  Feel free to call the clinic should you have any questions or concerns. The clinic phone number is [phone].  Please show the [place] at check-in to the Emergency Department and triage nurse.

## 2016-12-12 NOTE — Progress Notes (Signed)
[facility] OFFICE PROGRESS NOTE   Diagnosis: Non-Hodgkin's lymphoma  INTERVAL HISTORY:   Mr. Jacinto returns as scheduled.  He completed another treatment with rituximab 11/21/2016.  He feels well.  He is working.  He continues to have numbness in the hands and feet.  This does not interfere with activity.  He is golfing.  The numbness has not changed over the past 2 months.  Objective:  Vital signs in last 24 hours:  Blood pressure 128/77, pulse 98, temperature 99 F (37.2 C), temperature source Oral, resp. rate 19, height '5\' 8"'$  (1.727 m), weight 200 lb 12.8 oz (91.1 kg), SpO2 98 %.    HEENT: No thrush or ulcers Resp: Lungs clear bilaterally Cardio: Regular rate and rhythm GI: No hepatosplenomegaly Vascular: No leg edema Neuro: Moderate to severe loss of vibratory sense at the fingertips bilaterally  Portacath/PICC-without erythema  Lab Results:  Lab Results  Component Value Date   WBC 5.2 12/12/2016   HGB 14.5 12/12/2016   HCT 42.1 12/12/2016   MCV 88.8 12/12/2016   PLT 209 12/12/2016   NEUTROABS 3.7 12/12/2016    CMP     Component Value Date/Time   NA 138 11/21/2016 0844   K 4.2 11/21/2016 0844   CL 106 05/18/2016 1250   CO2 24 11/21/2016 0844   GLUCOSE 104 11/21/2016 0844   BUN 16.9 11/21/2016 0844   CREATININE 0.9 11/21/2016 0844   CALCIUM 9.2 11/21/2016 0844   PROT 6.5 11/21/2016 0844   ALBUMIN 3.8 11/21/2016 0844   AST 28 11/21/2016 0844   ALT 35 11/21/2016 0844   ALKPHOS 30 (L) 11/21/2016 0844   BILITOT 0.88 11/21/2016 0844   GFRNONAA >60 05/18/2016 1250   GFRAA >60 05/18/2016 1250     Medications: I have reviewed the patient's current medications.  Assessment/Plan: 1. 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  PET scan 03/20/2016 with extensive bulky hypermetabolic nodal activity in the neck, chest, abdomen and pelvis; mild splenomegaly with diffusely mildly accentuated splenic activity  Staging bone marrow biopsy 03/23/2016-negative for involvement with lymphoma  Cycle 1 EPOCH beginning 03/23/2016  Cycle 2 [facility] 04/13/2016  Restaging PET scan at [facility] 05/08/2016-lymph nodes with various degrees of FDG activity in the neck, axilla, right written him, mesentery, pelvis, and groin. Indeterminate liver lesions without FDG activity, nodular mass in the posterior medial aspect of the left thigh  Cycle 3 EPOCH04/27/2018  Cycle 1 CVP 06/21/2016  Cytoxan/prednisone plus brentuximab 07/17/2016  Cytoxan/prednisone plus brentuximab 08/07/2016 (dose reduced)  Initiation of every three-week brentuximab08/08/2016  brentuximabdose reduced 10/31/2016 secondary to neuropathy 2. 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 He entered clinical remission following chemotherapy and remained in remission when he was seen at the cancer center 02/24/2009. 3. 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 Staging PET scan 08/23/2010 with increased FDG activity at the left tonsillar fossa and no additional evidence of lymphoma. He completed 4 cycles of R-ICE with cycle #1 beginning on 09/19/2010 and cycle #4 on  11/21/2010. Repeat head and neck examination by Dr. Constance Holster following R-ICE/rituximab showed no residual lymphoma. He began radiation consolidation on 12/18/2010, radiation was completed on  01/22/2011. 4. History of neutropenia secondary to rituximab. 5. Anemia secondary to chemotherapy, status post a red blood cell transfusion 11/30/2010. The hemoglobin has normalized. 6. History of mild thrombocytopenia secondary to chemotherapy. 7. Hypertension. 8. Left thigh mass-status post surgical excision [facility] confirming a schwannoma  PET scan at [facility] 05/08/2017-nodular mass in the left thigh adjacent to the femur  MRI 06/07/2016-infiltrative mass in the left thigh adductor muscle, separate from the schwannoma excision site  MRI 10/08/2016-marked improvement in the left adductor muscle mass with a small area of enhancement remaining, no discrete mass 9. Port-A-Cath placement 03/21/2016 10. Superficial thrombus left greater saphenous vein 04/13/2016. Lovenox initiated, converted to Xarelto beginning 04/18/2016 11. 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  The plan is to continue every 3-week brentuximab.  The neuropathy is unchanged.  He understands the neuropathy symptoms may worsen with continuation of brentuximab.  He would like to proceed.  Mr. Shad will return for an office visit and brentuximab in 3 weeks.  [name], MD  12/12/2016  9:36 AM

## 2016-12-30 ENCOUNTER — Other Ambulatory Visit: Payer: Self-pay | Admitting: Oncology

## 2017-01-03 ENCOUNTER — Other Ambulatory Visit: Payer: Self-pay | Admitting: *Deleted

## 2017-01-03 ENCOUNTER — Ambulatory Visit (HOSPITAL_BASED_OUTPATIENT_CLINIC_OR_DEPARTMENT_OTHER): Payer: 59

## 2017-01-03 ENCOUNTER — Other Ambulatory Visit (HOSPITAL_BASED_OUTPATIENT_CLINIC_OR_DEPARTMENT_OTHER): Payer: 59

## 2017-01-03 ENCOUNTER — Ambulatory Visit: Payer: 59

## 2017-01-03 ENCOUNTER — Ambulatory Visit (HOSPITAL_BASED_OUTPATIENT_CLINIC_OR_DEPARTMENT_OTHER): Payer: 59 | Admitting: Oncology

## 2017-01-03 VITALS — BP 140/85 | HR 69 | Temp 98.8°F | Resp 18 | Ht 68.0 in | Wt 199.5 lb

## 2017-01-03 DIAGNOSIS — C8590 Non-Hodgkin lymphoma, unspecified, unspecified site: Secondary | ICD-10-CM

## 2017-01-03 DIAGNOSIS — C866 Primary cutaneous CD30-positive T-cell proliferations: Secondary | ICD-10-CM

## 2017-01-03 DIAGNOSIS — Z7901 Long term (current) use of anticoagulants: Secondary | ICD-10-CM

## 2017-01-03 DIAGNOSIS — Z86711 Personal history of pulmonary embolism: Secondary | ICD-10-CM

## 2017-01-03 DIAGNOSIS — G620 Drug-induced polyneuropathy: Secondary | ICD-10-CM

## 2017-01-03 DIAGNOSIS — Z5112 Encounter for antineoplastic immunotherapy: Secondary | ICD-10-CM | POA: Diagnosis not present

## 2017-01-03 DIAGNOSIS — Z95828 Presence of other vascular implants and grafts: Secondary | ICD-10-CM

## 2017-01-03 DIAGNOSIS — C8331 Diffuse large B-cell lymphoma, lymph nodes of head, face, and neck: Secondary | ICD-10-CM

## 2017-01-03 LAB — CBC WITH DIFFERENTIAL/PLATELET
BASO%: 0.6 % (ref 0.0–2.0)
BASOS ABS: 0 10*3/uL (ref 0.0–0.1)
EOS ABS: 0.3 10*3/uL (ref 0.0–0.5)
EOS%: 6.5 % (ref 0.0–7.0)
HCT: 43.3 % (ref 38.4–49.9)
HEMOGLOBIN: 14.7 g/dL (ref 13.0–17.1)
LYMPH#: 0.6 10*3/uL — AB (ref 0.9–3.3)
LYMPH%: 10.8 % — ABNORMAL LOW (ref 14.0–49.0)
MCH: 30.2 pg (ref 27.2–33.4)
MCHC: 33.9 g/dL (ref 32.0–36.0)
MCV: 88.9 fL (ref 79.3–98.0)
MONO#: 0.7 10*3/uL (ref 0.1–0.9)
MONO%: 13.3 % (ref 0.0–14.0)
NEUT%: 68.8 % (ref 39.0–75.0)
NEUTROS ABS: 3.5 10*3/uL (ref 1.5–6.5)
PLATELETS: 211 10*3/uL (ref 140–400)
RBC: 4.87 10*6/uL (ref 4.20–5.82)
RDW: 14.8 % — AB (ref 11.0–14.6)
WBC: 5.1 10*3/uL (ref 4.0–10.3)

## 2017-01-03 LAB — COMPREHENSIVE METABOLIC PANEL
ALBUMIN: 4.1 g/dL (ref 3.5–5.0)
ALK PHOS: 34 U/L — AB (ref 40–150)
ALT: 29 U/L (ref 0–55)
AST: 23 U/L (ref 5–34)
Anion Gap: 11 mEq/L (ref 3–11)
BILIRUBIN TOTAL: 0.96 mg/dL (ref 0.20–1.20)
BUN: 17.9 mg/dL (ref 7.0–26.0)
CO2: 24 mEq/L (ref 22–29)
Calcium: 9.4 mg/dL (ref 8.4–10.4)
Chloride: 106 mEq/L (ref 98–109)
Creatinine: 1 mg/dL (ref 0.7–1.3)
Glucose: 103 mg/dl (ref 70–140)
Potassium: 4.1 mEq/L (ref 3.5–5.1)
SODIUM: 141 meq/L (ref 136–145)
TOTAL PROTEIN: 6.5 g/dL (ref 6.4–8.3)

## 2017-01-03 MED ORDER — SODIUM CHLORIDE 0.9% FLUSH
10.0000 mL | Freq: Once | INTRAVENOUS | Status: AC
  Administered 2017-01-03: 10 mL
  Filled 2017-01-03: qty 10

## 2017-01-03 MED ORDER — SODIUM CHLORIDE 0.9 % IV SOLN
0.8500 mg/kg | Freq: Once | INTRAVENOUS | Status: AC
  Administered 2017-01-03: 75 mg via INTRAVENOUS
  Filled 2017-01-03: qty 15

## 2017-01-03 MED ORDER — DIPHENHYDRAMINE HCL 50 MG/ML IJ SOLN
INTRAMUSCULAR | Status: AC
  Filled 2017-01-03: qty 1

## 2017-01-03 MED ORDER — SODIUM CHLORIDE 0.9 % IV SOLN
Freq: Once | INTRAVENOUS | Status: AC
  Administered 2017-01-03: 10:00:00 via INTRAVENOUS

## 2017-01-03 MED ORDER — ZOLPIDEM TARTRATE 5 MG PO TABS: 5.0000 mg | ORAL_TABLET | Freq: Every evening | ORAL | 2 refills | Status: DC | PRN

## 2017-01-03 MED ORDER — DIPHENHYDRAMINE HCL 50 MG/ML IJ SOLN
25.0000 mg | Freq: Once | INTRAMUSCULAR | Status: AC
  Administered 2017-01-03: 25 mg via INTRAVENOUS

## 2017-01-03 MED ORDER — HEPARIN SOD (PORK) LOCK FLUSH 100 UNIT/ML IV SOLN
500.0000 [IU] | Freq: Once | INTRAVENOUS | Status: AC | PRN
  Administered 2017-01-03: 500 [IU]
  Filled 2017-01-03: qty 5

## 2017-01-03 MED ORDER — ACETAMINOPHEN 325 MG PO TABS
ORAL_TABLET | ORAL | Status: AC
  Filled 2017-01-03: qty 2

## 2017-01-03 MED ORDER — ACETAMINOPHEN 325 MG PO TABS
650.0000 mg | ORAL_TABLET | Freq: Once | ORAL | Status: AC
  Administered 2017-01-03: 650 mg via ORAL

## 2017-01-03 MED ORDER — SODIUM CHLORIDE 0.9% FLUSH
10.0000 mL | INTRAVENOUS | Status: DC | PRN
  Administered 2017-01-03: 10 mL
  Filled 2017-01-03: qty 10

## 2017-01-03 NOTE — Patient Instructions (Signed)
[facility] Discharge Instructions for Patients Receiving Chemotherapy  Today you received the following chemotherapy agents: Brentuximab vedotin (Adentris).  To help prevent nausea and vomiting after your treatment, we encourage you to take your nausea medication as prescribed.  If you develop nausea and vomiting that is not controlled by your nausea medication, call the clinic.   BELOW ARE SYMPTOMS THAT SHOULD BE REPORTED IMMEDIATELY:  *FEVER GREATER THAN 100.5 F  *CHILLS WITH OR WITHOUT FEVER  NAUSEA AND VOMITING THAT IS NOT CONTROLLED WITH YOUR NAUSEA MEDICATION  *UNUSUAL SHORTNESS OF BREATH  *UNUSUAL BRUISING OR BLEEDING  TENDERNESS IN MOUTH AND THROAT WITH OR WITHOUT PRESENCE OF ULCERS  *URINARY PROBLEMS  *BOWEL PROBLEMS  UNUSUAL RASH Items with * indicate a potential emergency and should be followed up as soon as possible.  Feel free to call the clinic should you have any questions or concerns. The clinic phone number is [phone].  Please show the [place] at check-in to the Emergency Department and triage nurse.

## 2017-01-03 NOTE — Patient Instructions (Signed)
Implanted Port Home Guide An implanted port is a type of central line that is placed under the skin. Central lines are used to provide IV access when treatment or nutrition needs to be given through a person's veins. Implanted ports are used for long-term IV access. An implanted port may be placed because:  You need IV medicine that would be irritating to the small veins in your hands or arms.  You need long-term IV medicines, such as antibiotics.  You need IV nutrition for a long period.  You need frequent blood draws for lab tests.  You need dialysis.  Implanted ports are usually placed in the chest area, but they can also be placed in the upper arm, the abdomen, or the leg. An implanted port has two main parts:  Reservoir. The reservoir is round and will appear as a small, raised area under your skin. The reservoir is the part where a needle is inserted to give medicines or draw blood.  Catheter. The catheter is a thin, flexible tube that extends from the reservoir. The catheter is placed into a large vein. Medicine that is inserted into the reservoir goes into the catheter and then into the vein.  How will I care for my incision site? Do not get the incision site wet. Bathe or shower as directed by your health care provider. How is my port accessed? Special steps must be taken to access the port:  Before the port is accessed, a numbing cream can be placed on the skin. This helps numb the skin over the port site.  Your health care provider uses a sterile technique to access the port. ? Your health care provider must put on a mask and sterile gloves. ? The skin over your port is cleaned carefully with an antiseptic and allowed to dry. ? The port is gently pinched between sterile gloves, and a needle is inserted into the port.  Only "non-coring" port needles should be used to access the port. Once the port is accessed, a blood return should be checked. This helps ensure that the port  is in the vein and is not clogged.  If your port needs to remain accessed for a constant infusion, a clear (transparent) bandage will be placed over the needle site. The bandage and needle will need to be changed every week, or as directed by your health care provider.  Keep the bandage covering the needle clean and dry. Do not get it wet. Follow your health care provider's instructions on how to take a shower or bath while the port is accessed.  If your port does not need to stay accessed, no bandage is needed over the port.  What is flushing? Flushing helps keep the port from getting clogged. Follow your health care provider's instructions on how and when to flush the port. Ports are usually flushed with saline solution or a medicine called heparin. The need for flushing will depend on how the port is used.  If the port is used for intermittent medicines or blood draws, the port will need to be flushed: ? After medicines have been given. ? After blood has been drawn. ? As part of routine maintenance.  If a constant infusion is running, the port may not need to be flushed.  How long will my port stay implanted? The port can stay in for as long as your health care provider thinks it is needed. When it is time for the port to come out, surgery will be   done to remove it. The procedure is similar to the one performed when the port was put in. When should I seek immediate medical care? When you have an implanted port, you should seek immediate medical care if:  You notice a bad smell coming from the incision site.  You have swelling, redness, or drainage at the incision site.  You have more swelling or pain at the port site or the surrounding area.  You have a fever that is not controlled with medicine.  This information is not intended to replace advice given to you by your health care provider. Make sure you discuss any questions you have with your health care provider. Document  Released: 01/08/2005 Document Revised: 06/16/2015 Document Reviewed: 09/15/2012 Elsevier Interactive Patient Education  2017 Elsevier Inc.  

## 2017-01-03 NOTE — Telephone Encounter (Signed)
Informed pt that [place] script has been called in.

## 2017-01-03 NOTE — Progress Notes (Signed)
[place] OFFICE PROGRESS NOTE   Diagnosis: T-cell lymphoma  INTERVAL HISTORY:   [name] returns as scheduled.  He completed another treatment with brentuximab 12/12/2016.  He feels well.  He has noted mild progression of neuropathy symptoms in the hands and feet.  He is able to go about his usual activities.  No other complaint.  Objective:  Vital signs in last 24 hours:  Blood pressure 140/85, pulse 69, temperature 98.8 F (37.1 C), temperature source Oral, resp. rate 18, height '5\' 8"'$  (1.727 m), weight 199 lb 8 oz (90.5 kg), SpO2 99 %.    HEENT: No thrush or ulcers Resp: Lungs clear bilaterally Cardio: Regular rate and rhythm GI: No hepatosplenomegaly Vascular: No leg edema Neuro: Moderate loss of vibratory sense at the fingertips bilaterally.  He is able to distinguish various textures    Portacath/PICC-without erythema  Lab Results:  Lab Results  Component Value Date   WBC 5.1 01/03/2017   HGB 14.7 01/03/2017   HCT 43.3 01/03/2017   MCV 88.9 01/03/2017   PLT 211 01/03/2017   NEUTROABS 3.5 01/03/2017    CMP     Component Value Date/Time   NA 141 01/03/2017 0836   K 4.1 01/03/2017 0836   CL 106 05/18/2016 1250   CO2 24 01/03/2017 0836   GLUCOSE 103 01/03/2017 0836   BUN 17.9 01/03/2017 0836   CREATININE 1.0 01/03/2017 0836   CALCIUM 9.4 01/03/2017 0836   PROT 6.5 01/03/2017 0836   ALBUMIN 4.1 01/03/2017 0836   AST 23 01/03/2017 0836   ALT 29 01/03/2017 0836   ALKPHOS 34 (L) 01/03/2017 0836   BILITOT 0.96 01/03/2017 0836   GFRNONAA >60 05/18/2016 1250   GFRAA >60 05/18/2016 1250    No results found for: CEA1  Lab Results  Component Value Date   INR 1.10 03/21/2016    Imaging:  No results found.  Medications: I have reviewed the patient's current medications.  Assessment/Plan: 1. 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  PET scan 03/20/2016 with extensive bulky hypermetabolic nodal activity in the neck, chest, abdomen and pelvis; mild splenomegaly with diffusely mildly accentuated splenic activity  Staging bone marrow biopsy 03/23/2016-negative for involvement with lymphoma  Cycle 1 EPOCH beginning 03/23/2016  Cycle 2 [facility] 04/13/2016  Restaging PET scan at [facility] 05/08/2016-lymph nodes with various degrees of FDG activity in the neck, axilla, right written him, mesentery, pelvis, and groin. Indeterminate liver lesions without FDG activity, nodular mass in the posterior medial aspect of the left thigh  Cycle 3 EPOCH04/27/2018  Cycle 1 CVP 06/21/2016  Cytoxan/prednisone plus brentuximab 07/17/2016  Cytoxan/prednisone plus brentuximab 08/07/2016 (dose reduced)  Initiation of every three-week brentuximab08/08/2016  brentuximabdose reduced 10/31/2016 secondary to neuropathy  Brentuximab further dose reduced 01/03/2017 secondary to neuropathy 2. 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 He entered clinical remission following chemotherapy and remained in remission when he was seen at the cancer center 02/24/2009. 3. 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 Staging PET  scan 08/23/2010 with increased FDG activity at the left tonsillar fossa and no additional evidence of lymphoma. He completed 4 cycles of R-ICE with cycle #1 beginning on 09/19/2010 and cycle #4 on 11/21/2010.  Repeat head and neck examination by Dr. Constance Holster following R-ICE/rituximab showed no residual lymphoma. He began radiation consolidation on 12/18/2010, radiation was completed on 01/22/2011. 4. History of neutropenia secondary to rituximab. 5. Anemia secondary to chemotherapy, status post a red blood cell transfusion 11/30/2010. The hemoglobin has normalized. 6. History of mild thrombocytopenia secondary to chemotherapy. 7. Hypertension. 8. Left thigh mass-status post surgical excision [facility] confirming a schwannoma  PET scan at [facility] 05/08/2017-nodular mass in the left thigh adjacent to the femur  MRI 06/07/2016-infiltrative mass in the left thigh adductor muscle, separate from the schwannoma excision site  MRI 10/08/2016-marked improvement in the left adductormusclemass with a small area of enhancement remaining, no discrete mass 9. Port-A-Cath placement 03/21/2016 10. Superficial thrombus left greater saphenous vein 04/13/2016. Lovenox initiated, converted to Xarelto beginning 04/18/2016 11. 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  He has persistent and progressive neuropathy symptoms secondary to brentuximab.  He would like to continue brentuximab.  We decided to dose reduce the brentuximab again today.  He will return for an office visit and schedule brentuximab on 01/24/2017.  The plan is to discontinue brentuximab if there is further progression of the neuropathy.    [name], MD  01/03/2017  9:32 AM

## 2017-01-18 ENCOUNTER — Telehealth: Payer: Self-pay

## 2017-01-18 NOTE — Telephone Encounter (Signed)
Returned call regarding FMLA paperwork. Copy at front desk with Ms. [name] for patient to pick up. Patient voiced understanding.

## 2017-01-20 ENCOUNTER — Other Ambulatory Visit: Payer: Self-pay | Admitting: Oncology

## 2017-01-24 ENCOUNTER — Ambulatory Visit: Payer: 59

## 2017-01-24 ENCOUNTER — Inpatient Hospital Stay: Payer: 59 | Attending: Oncology | Admitting: Oncology

## 2017-01-24 ENCOUNTER — Ambulatory Visit (HOSPITAL_BASED_OUTPATIENT_CLINIC_OR_DEPARTMENT_OTHER): Payer: 59

## 2017-01-24 ENCOUNTER — Other Ambulatory Visit (HOSPITAL_BASED_OUTPATIENT_CLINIC_OR_DEPARTMENT_OTHER): Payer: 59

## 2017-01-24 DIAGNOSIS — C866 Primary cutaneous CD30-positive T-cell proliferations: Secondary | ICD-10-CM | POA: Diagnosis not present

## 2017-01-24 DIAGNOSIS — R05 Cough: Secondary | ICD-10-CM | POA: Insufficient documentation

## 2017-01-24 DIAGNOSIS — Z86718 Personal history of other venous thrombosis and embolism: Secondary | ICD-10-CM | POA: Diagnosis not present

## 2017-01-24 DIAGNOSIS — C8590 Non-Hodgkin lymphoma, unspecified, unspecified site: Secondary | ICD-10-CM

## 2017-01-24 DIAGNOSIS — Z5112 Encounter for antineoplastic immunotherapy: Secondary | ICD-10-CM

## 2017-01-24 DIAGNOSIS — G620 Drug-induced polyneuropathy: Secondary | ICD-10-CM | POA: Diagnosis not present

## 2017-01-24 DIAGNOSIS — Z7901 Long term (current) use of anticoagulants: Secondary | ICD-10-CM | POA: Diagnosis not present

## 2017-01-24 DIAGNOSIS — Z9221 Personal history of antineoplastic chemotherapy: Secondary | ICD-10-CM | POA: Insufficient documentation

## 2017-01-24 DIAGNOSIS — Z923 Personal history of irradiation: Secondary | ICD-10-CM | POA: Insufficient documentation

## 2017-01-24 DIAGNOSIS — J069 Acute upper respiratory infection, unspecified: Secondary | ICD-10-CM | POA: Insufficient documentation

## 2017-01-24 DIAGNOSIS — Z95828 Presence of other vascular implants and grafts: Secondary | ICD-10-CM

## 2017-01-24 DIAGNOSIS — C8331 Diffuse large B-cell lymphoma, lymph nodes of head, face, and neck: Secondary | ICD-10-CM

## 2017-01-24 LAB — CBC WITH DIFFERENTIAL/PLATELET
BASO%: 1 % (ref 0.0–2.0)
Basophils Absolute: 0 10*3/uL (ref 0.0–0.1)
EOS%: 7.2 % — AB (ref 0.0–7.0)
Eosinophils Absolute: 0.4 10*3/uL (ref 0.0–0.5)
HCT: 42 % (ref 38.4–49.9)
HGB: 14.1 g/dL (ref 13.0–17.1)
LYMPH#: 0.3 10*3/uL — AB (ref 0.9–3.3)
LYMPH%: 7 % — ABNORMAL LOW (ref 14.0–49.0)
MCH: 29.9 pg (ref 27.2–33.4)
MCHC: 33.6 g/dL (ref 32.0–36.0)
MCV: 88.9 fL (ref 79.3–98.0)
MONO#: 0.6 10*3/uL (ref 0.1–0.9)
MONO%: 12.3 % (ref 0.0–14.0)
NEUT#: 3.5 10*3/uL (ref 1.5–6.5)
NEUT%: 72.5 % (ref 39.0–75.0)
Platelets: 190 10*3/uL (ref 140–400)
RBC: 4.72 10*6/uL (ref 4.20–5.82)
RDW: 15.2 % — ABNORMAL HIGH (ref 11.0–14.6)
WBC: 4.9 10*3/uL (ref 4.0–10.3)

## 2017-01-24 LAB — COMPREHENSIVE METABOLIC PANEL
ALT: 33 U/L (ref 0–55)
AST: 26 U/L (ref 5–34)
Albumin: 3.9 g/dL (ref 3.5–5.0)
Alkaline Phosphatase: 28 U/L — ABNORMAL LOW (ref 40–150)
Anion Gap: 10 mEq/L (ref 3–11)
BUN: 16.8 mg/dL (ref 7.0–26.0)
CHLORIDE: 105 meq/L (ref 98–109)
CO2: 24 mEq/L (ref 22–29)
Calcium: 9.2 mg/dL (ref 8.4–10.4)
Creatinine: 0.9 mg/dL (ref 0.7–1.3)
GLUCOSE: 99 mg/dL (ref 70–140)
POTASSIUM: 4.4 meq/L (ref 3.5–5.1)
SODIUM: 140 meq/L (ref 136–145)
Total Bilirubin: 1.01 mg/dL (ref 0.20–1.20)
Total Protein: 6.5 g/dL (ref 6.4–8.3)

## 2017-01-24 LAB — LACTATE DEHYDROGENASE: LDH: 215 U/L (ref 125–245)

## 2017-01-24 MED ORDER — HEPARIN SOD (PORK) LOCK FLUSH 100 UNIT/ML IV SOLN
500.0000 [IU] | Freq: Once | INTRAVENOUS | Status: AC | PRN
  Administered 2017-01-24: 500 [IU]
  Filled 2017-01-24: qty 5

## 2017-01-24 MED ORDER — SODIUM CHLORIDE 0.9% FLUSH
10.0000 mL | INTRAVENOUS | Status: DC | PRN
  Administered 2017-01-24: 10 mL
  Filled 2017-01-24: qty 10

## 2017-01-24 MED ORDER — SODIUM CHLORIDE 0.9 % IV SOLN
0.8500 mg/kg | Freq: Once | INTRAVENOUS | Status: AC
  Administered 2017-01-24: 75 mg via INTRAVENOUS
  Filled 2017-01-24: qty 15

## 2017-01-24 MED ORDER — ACETAMINOPHEN 325 MG PO TABS
ORAL_TABLET | ORAL | Status: AC
  Filled 2017-01-24: qty 2

## 2017-01-24 MED ORDER — RIVAROXABAN 20 MG PO TABS: 20.0000 mg | ORAL_TABLET | Freq: Every day | ORAL | 3 refills | Status: DC

## 2017-01-24 MED ORDER — SODIUM CHLORIDE 0.9% FLUSH
10.0000 mL | Freq: Once | INTRAVENOUS | Status: AC
  Administered 2017-01-24: 10 mL
  Filled 2017-01-24: qty 10

## 2017-01-24 MED ORDER — SODIUM CHLORIDE 0.9 % IV SOLN
Freq: Once | INTRAVENOUS | Status: AC
Start: 2017-01-24 — End: 2017-01-24
  Administered 2017-01-24: 10:00:00 via INTRAVENOUS

## 2017-01-24 MED ORDER — ACETAMINOPHEN 325 MG PO TABS
650.0000 mg | ORAL_TABLET | Freq: Once | ORAL | Status: AC
  Administered 2017-01-24: 650 mg via ORAL

## 2017-01-24 MED ORDER — DIPHENHYDRAMINE HCL 50 MG/ML IJ SOLN
INTRAMUSCULAR | Status: AC
Start: 2017-01-24 — End: 2017-01-24
  Filled 2017-01-24: qty 1

## 2017-01-24 MED ORDER — DIPHENHYDRAMINE HCL 50 MG/ML IJ SOLN
25.0000 mg | Freq: Once | INTRAMUSCULAR | Status: AC
  Administered 2017-01-24: 25 mg via INTRAVENOUS

## 2017-01-24 NOTE — Progress Notes (Signed)
[place] OFFICE PROGRESS NOTE   Diagnosis: Non-Hodgkin's lymphoma  INTERVAL HISTORY:   [name] returns as scheduled.  He completed another treatment with brentuximab 01/03/2017.  He tolerated the treatment well.  He has noted a mild increase in the toe numbness.  No change in numbness at the fingers.  This does not interfere with activity.  No palpable lymph nodes.  No other complaint.  Objective:  Vital signs in last 24 hours:  Blood pressure 132/78, pulse 64, temperature 98.5 F (36.9 C), temperature source Oral, resp. rate 20, height 5' 8" (1.727 m), weight 201 lb 8 oz (91.4 kg), SpO2 98 %.    HEENT: No thrush or ulcers Lymphatics: No cervical or supraclavicular nodes Resp: Lungs clear bilaterally Cardio: Regular rate and rhythm GI: No hepatosplenomegaly, no mass, nontender Vascular: No leg edema Neuro: Moderate loss of vibratory sense at the fingertips bilaterally, severe loss of vibratory sense at the toes bilaterally, sensation intact to light touch at the soles   Portacath/PICC-without erythema  Lab Results:  Lab Results  Component Value Date   WBC 4.9 01/24/2017   HGB 14.1 01/24/2017   HCT 42.0 01/24/2017   MCV 88.9 01/24/2017   PLT 190 01/24/2017   NEUTROABS 3.5 01/24/2017    CMP     Component Value Date/Time   NA 140 01/24/2017 0839   K 4.4 01/24/2017 0839   CL 106 05/18/2016 1250   CO2 24 01/24/2017 0839   GLUCOSE 99 01/24/2017 0839   BUN 16.8 01/24/2017 0839   CREATININE 0.9 01/24/2017 0839   CALCIUM 9.2 01/24/2017 0839   PROT 6.5 01/24/2017 0839   ALBUMIN 3.9 01/24/2017 0839   AST 26 01/24/2017 0839   ALT 33 01/24/2017 0839   ALKPHOS 28 (L) 01/24/2017 0839   BILITOT 1.01 01/24/2017 0839   GFRNONAA >60 05/18/2016 1250   GFRAA >60 05/18/2016 1250     Medications: I have reviewed the patient's current medications.   Assessment/Plan: 1. 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  PET scan 03/20/2016 with extensive bulky hypermetabolic nodal activity in the neck, chest, abdomen and pelvis; mild splenomegaly with diffusely mildly accentuated splenic activity  Staging bone marrow biopsy 03/23/2016-negative for involvement with lymphoma  Cycle 1 EPOCH beginning 03/23/2016  Cycle 2 [facility] 04/13/2016  Restaging PET scan at [facility] 05/08/2016-lymph nodes with various degrees of FDG activity in the neck, axilla, right written him, mesentery, pelvis, and groin. Indeterminate liver lesions without FDG activity, nodular mass in the posterior medial aspect of the left thigh  Cycle 3 EPOCH04/27/2018  Cycle 1 CVP 06/21/2016  Cytoxan/prednisone plus brentuximab 07/17/2016  Cytoxan/prednisone plus brentuximab 08/07/2016 (dose reduced)  Initiation of every three-week brentuximab08/08/2016  brentuximabdose reduced 10/31/2016 secondary to neuropathy  Brentuximab further dose reduced 01/03/2017 secondary to neuropathy 2. 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 He entered clinical remission following chemotherapy and remained in remission when he was seen at the cancer center 02/24/2009. 3. 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  Staging PET scan 08/23/2010 with increased FDG activity at the left tonsillar fossa and no additional evidence of lymphoma. He completed 4 cycles of R-ICE  with cycle #1 beginning on 09/19/2010 and cycle #4 on 11/21/2010. Repeat head and neck examination by Dr. Constance Lee following R-ICE/rituximab showed no residual lymphoma. He began radiation consolidation on 12/18/2010, radiation was completed on 01/22/2011. 4. History of neutropenia secondary to rituximab. 5. Anemia secondary to chemotherapy, status post a red blood cell transfusion 11/30/2010. The hemoglobin has normalized. 6. History of mild thrombocytopenia secondary to chemotherapy. 7. Hypertension. 8. Left thigh mass-status post surgical excision [facility] confirming a schwannoma  PET scan at [name] 05/08/2017-nodular mass in the left thigh adjacent to the femur  MRI 06/07/2016-infiltrative mass in the left thigh adductor muscle, separate from the schwannoma excision site  MRI 10/08/2016-marked improvement in the left adductormusclemass with a small area of enhancement remaining, no discrete mass 9. Port-A-Cath placement 03/21/2016 10. Superficial thrombus left greater saphenous vein 04/13/2016. Lovenox initiated, converted to Xarelto beginning 04/18/2016 11. 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  He is tolerating the brentuximab well aside from persistent neuropathy symptoms.  The neuropathy has not changed significantly over the past several months.  He would like to complete the planned course of brentuximab.  He will complete another treatment today, cycle 10.  He will return for office visit and brentuximab in 3 weeks.  There is no clinical evidence for progression of lymphoma.  Don Coder, MD  01/24/2017  10:24 AM

## 2017-01-24 NOTE — Patient Instructions (Signed)
[place] Discharge Instructions for Patients Receiving Chemotherapy  Today you received the following chemotherapy agents: Brentuximab vedotin (Adcetris)  To help prevent nausea and vomiting after your treatment, we encourage you to take your nausea medication as prescribed.  If you develop nausea and vomiting that is not controlled by your nausea medication, call the clinic.   BELOW ARE SYMPTOMS THAT SHOULD BE REPORTED IMMEDIATELY:  *FEVER GREATER THAN 100.5 F  *CHILLS WITH OR WITHOUT FEVER  NAUSEA AND VOMITING THAT IS NOT CONTROLLED WITH YOUR NAUSEA MEDICATION  *UNUSUAL SHORTNESS OF BREATH  *UNUSUAL BRUISING OR BLEEDING  TENDERNESS IN MOUTH AND THROAT WITH OR WITHOUT PRESENCE OF ULCERS  *URINARY PROBLEMS  *BOWEL PROBLEMS  UNUSUAL RASH Items with * indicate a potential emergency and should be followed up as soon as possible.  Feel free to call the clinic should you have any questions or concerns. The clinic phone number is [phone].  Please show the [place] at check-in to the Emergency Department and triage nurse.

## 2017-01-28 ENCOUNTER — Telehealth: Payer: Self-pay | Admitting: Oncology

## 2017-01-28 NOTE — Telephone Encounter (Signed)
Gave avs and calendar for January and february °

## 2017-01-30 ENCOUNTER — Telehealth: Payer: Self-pay

## 2017-01-30 NOTE — Telephone Encounter (Signed)
Patient wife called to report that he has "a chest cold and a bad cough". Patient wife inquired on "what can he take for the cough since he's on Xarelto?" Dr. [name] made aware. Per MD, ok for patient to take Robitussin. Patient wife voiced understanding. Will call back with any further concerns.

## 2017-02-07 ENCOUNTER — Telehealth: Payer: Self-pay

## 2017-02-07 NOTE — Telephone Encounter (Signed)
Called patient regarding triage note last night about cough. Pt wife states that patient has "some sort of upper respiratory something" that has been going on for "about 10 days". They went to their PCP Monday and were prescribed an inhaler per the wife. Pt has been having "massive coughing fits" with slight difficulty breathing during and after "he's fine"; wife states it took over 1 hour to get the coughing "under control". Reports productive "clear/green" cough, Denies fever, SOB. Pt states congestion is more in sinus area now. Will consult with Dr. [name].   Per Dr. [name], with the absence of SOB and no fever, symptoms likely viral. Try to get in with PCP for chest xray if symptoms persist. Pt and wife voiced understanding. Will call with any further questions/concerns.

## 2017-02-08 ENCOUNTER — Other Ambulatory Visit: Payer: Self-pay | Admitting: Medical

## 2017-02-08 ENCOUNTER — Ambulatory Visit (HOSPITAL_COMMUNITY)
Admission: RE | Admit: 2017-02-08 | Discharge: 2017-02-08 | Disposition: A | Discharge status: Home / Self Care | Payer: 59 | Source: Ambulatory Visit | Attending: Medical | Admitting: Medical

## 2017-02-08 ENCOUNTER — Inpatient Hospital Stay (HOSPITAL_BASED_OUTPATIENT_CLINIC_OR_DEPARTMENT_OTHER): Payer: 59 | Admitting: Medical

## 2017-02-08 ENCOUNTER — Inpatient Hospital Stay: Payer: 59

## 2017-02-08 ENCOUNTER — Telehealth: Payer: Self-pay

## 2017-02-08 VITALS — BP 129/83 | HR 57 | Temp 98.1°F | Resp 20 | Wt 199.1 lb

## 2017-02-08 DIAGNOSIS — R058 Other specified cough: Secondary | ICD-10-CM

## 2017-02-08 DIAGNOSIS — C8590 Non-Hodgkin lymphoma, unspecified, unspecified site: Secondary | ICD-10-CM

## 2017-02-08 DIAGNOSIS — J069 Acute upper respiratory infection, unspecified: Secondary | ICD-10-CM | POA: Diagnosis not present

## 2017-02-08 DIAGNOSIS — R05 Cough: Secondary | ICD-10-CM

## 2017-02-08 DIAGNOSIS — G620 Drug-induced polyneuropathy: Secondary | ICD-10-CM | POA: Diagnosis not present

## 2017-02-08 DIAGNOSIS — C866 Primary cutaneous CD30-positive T-cell proliferations: Secondary | ICD-10-CM | POA: Diagnosis not present

## 2017-02-08 DIAGNOSIS — Z5112 Encounter for antineoplastic immunotherapy: Secondary | ICD-10-CM | POA: Diagnosis present

## 2017-02-08 DIAGNOSIS — Z9221 Personal history of antineoplastic chemotherapy: Secondary | ICD-10-CM | POA: Diagnosis not present

## 2017-02-08 DIAGNOSIS — Z923 Personal history of irradiation: Secondary | ICD-10-CM

## 2017-02-08 LAB — CBC WITH DIFFERENTIAL (CANCER CENTER ONLY)
BASOS ABS: 0 10*3/uL (ref 0.0–0.1)
Basophils Relative: 0 %
EOS PCT: 5 %
Eosinophils Absolute: 0.4 10*3/uL (ref 0.0–0.5)
HEMATOCRIT: 43 % (ref 38.4–49.9)
Hemoglobin: 14.7 g/dL (ref 13.0–17.1)
LYMPHS ABS: 0.4 10*3/uL — AB (ref 0.9–3.3)
LYMPHS PCT: 5 %
MCH: 30.2 pg (ref 27.2–33.4)
MCHC: 34.2 g/dL (ref 32.0–36.0)
MCV: 88.5 fL (ref 79.3–98.0)
MONOS PCT: 12 %
Monocytes Absolute: 1 10*3/uL — ABNORMAL HIGH (ref 0.1–0.9)
NEUTROS ABS: 6.4 10*3/uL (ref 1.5–6.5)
Neutrophils Relative %: 78 %
Platelet Count: 205 10*3/uL (ref 140–400)
RBC: 4.86 MIL/uL (ref 4.20–5.82)
RDW: 13.8 % (ref 11.0–15.6)
WBC Count: 8.2 10*3/uL (ref 4.0–10.3)

## 2017-02-08 MED ORDER — AZITHROMYCIN 250 MG PO TABS: ORAL_TABLET | ORAL | 0 refills | Status: DC

## 2017-02-08 NOTE — Progress Notes (Signed)
Pt in [facility] d/t ongoing cough x 1-12 days, sore throat. Coughs up green secretions mostly in the morning.

## 2017-02-08 NOTE — Progress Notes (Signed)
[facility] Progress Note   [name] [id number] 27-Apr-1960 57 y.o.  [name] is managed by Dr. 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  He was also given a letter stating that he should not travel via airplane for the next 7-10 days.  He was previously scheduled to attend a conference in [place].  T-cell lymphoma: Patient is status post cycle 11, day 1 of brentuximab which was dosed on 01/24/2017.  He is scheduled to follow-up with Dr. [name] on 02/15/2017.  Please see After Visit Summary for patient specific instructions.  Future Appointments  Date Time Provider [place]  02/15/2017  1:15 PM [facility] ONLY [facility] None  02/15/2017  1:30 PM [facility] E16 [facility] None  02/15/2017  2:00 PM Ladell Pier, MD [facility] None  02/15/2017  3:00 PM [facility] C8 [facility] None  03/08/2017  1:30 PM [facility] LAB 1 [facility] None  03/08/2017  1:45 PM [facility] INJ NURSE [facility] None  03/08/2017  2:15 PM [name], NP [facility] None  03/08/2017  3:15 PM [facility] C10 [facility] None    No orders of the defined types were placed in this encounter.      Subjective:   Patient ID:  [name] is a 57 y.o. (DOB 04/29/1960) male.  Chief Complaint:  Chief Complaint  Patient presents with  . Cough    HPI [name] is a 57 year old male with a diagnosis of a T-cell lymphoma.  He is seen by Dr. Ladell Pier and is currently treated with brentuximab and was last treated with cycle 11-day 1 on 01/24/2017.  He presents to the office today with a 2-week history of a productive cough with greenish  sputum.  He was seen by his primary care provider earlier this week and was given an inhaler.  Despite this he continues to have a cough with his throat sore from coughing.  He has been taking over-the-counter NyQuil at night.  He had a chest x-ray completed today which showed no evidence of an acute process.  He had labs completed which returned showing no leukocytosis.  He reports that he has had some family members at home who have been sick.  He denies fevers, chills, or sweats.  Medications: I have reviewed the patient's current medications.  Allergies:  Allergies  Allergen Reactions  . Penicillins Other (See Comments)    Childhood allergy reaction unknown  Has patient had a PCN reaction causing immediate rash, facial/tongue/throat swelling, SOB or lightheadedness with hypotension: Unknown Has patient had a PCN reaction causing severe rash involving mucus membranes or skin necrosis: Unknown Has patient had a PCN reaction that required hospitalization: Unknown Has patient had a PCN reaction occurring within the last 10 years: Unknown If all of the above answers are "NO", then may proceed with Cephalosporin use.     Past Medical History:  Diagnosis Date  . History of radiation therapy 12/18/10 to 01/22/11   left oropharynx  . Hypertension   . Neutropenia    Secondary to Rituxan  . Non Hodgkin's lymphoma ([place])    s/p 4 cycles R-ICE, start 11/21/10  . T-cell lymphoma ([place]) 03/14/2016  Past Surgical History:  Procedure Laterality Date  . BIOPSY PHARYNX     09/20/10 excision right posterior pharynx  . BONE MARROW ASPIRATION      09/08/10 Biopsy , and clot, left iliac crest  . IR GENERIC HISTORICAL  03/21/2016   IR FLUORO GUIDE PORT INSERTION RIGHT 03/21/2016 Arne Cleveland, MD [facility]-INTERV RAD  . IR GENERIC HISTORICAL  03/21/2016   IR US GUIDE VASC ACCESS RIGHT 03/21/2016 Arne Cleveland, MD [facility]-INTERV RAD  . PORTACATH PLACEMENT  09/19/10  . TONSILLECTOMY     Bilateral    No family  history on file.  Social History   Socioeconomic History  . Marital status: Married    Spouse name: Not on file  . Number of children: Not on file  . Years of education: Not on file  . Highest education level: Not on file  Social Needs  . Financial resource strain: Not on file  . Food insecurity - worry: Not on file  . Food insecurity - inability: Not on file  . Transportation needs - medical: Not on file  . Transportation needs - non-medical: Not on file  Occupational History  . Not on file  Tobacco Use  . Smoking status: Former Smoker    Packs/day: 0.50    Years: 5.00    Pack years: 2.50    Types: Cigarettes    Last attempt to quit: 11/30/1990    Years since quitting: 26.2  . Smokeless tobacco: Never Used  Substance and Sexual Activity  . Alcohol use: No  . Drug use: No  . Sexual activity: Not Currently  Other Topics Concern  . Not on file  Social History Narrative   Married, 2 children, Social research officer, government    Past Medical History, Surgical history, Social history, and Family history were reviewed and updated as appropriate.   Please see review of systems for further details on the patient's review from today.   Review of Systems:  Review of Systems  Constitutional: Negative for chills, diaphoresis and fever.  HENT: Positive for sore throat. Negative for congestion, postnasal drip, rhinorrhea, sinus pressure, sinus pain and sneezing.   Respiratory: Positive for cough. Negative for choking, chest tightness, shortness of breath and wheezing.   Cardiovascular: Negative for chest pain and palpitations.  Neurological: Negative for headaches.    Objective:   Physical Exam:  BP 129/83 (BP Location: Left Arm, Patient Position: Sitting)   Pulse (!) 57   Temp 98.1 F (36.7 C) (Oral)   Resp 20   Wt 199 lb 2 oz (90.3 kg)   SpO2 98%   BMI 30.28 kg/m  ECOG: 0  Physical Exam  Constitutional: No distress.  HENT:  Head: Normocephalic and atraumatic.  Right Ear:  External ear normal.  Left Ear: External ear normal.  Mouth/Throat: Oropharynx is clear and moist. No oropharyngeal exudate.  Neck: Normal range of motion. Neck supple.  Cardiovascular: Normal rate, regular rhythm and normal heart sounds. Exam reveals no gallop and no friction rub.  No murmur heard. Pulmonary/Chest: Effort normal and breath sounds normal. No respiratory distress. He has no wheezes. He has no rales.  Lymphadenopathy:    He has no cervical adenopathy.  Neurological: He is alert. Coordination normal.  Skin: Skin is warm and dry. No rash noted. He is not diaphoretic. No erythema.  Psychiatric: He has a normal mood and affect. His behavior is normal. Judgment and thought content normal.    Lab Review:     Component Value Date/Time  NA 140 01/24/2017 0839   K 4.4 01/24/2017 0839   CL 106 05/18/2016 1250   CO2 24 01/24/2017 0839   GLUCOSE 99 01/24/2017 0839   BUN 16.8 01/24/2017 0839   CREATININE 0.9 01/24/2017 0839   CALCIUM 9.2 01/24/2017 0839   PROT 6.5 01/24/2017 0839   ALBUMIN 3.9 01/24/2017 0839   AST 26 01/24/2017 0839   ALT 33 01/24/2017 0839   ALKPHOS 28 (L) 01/24/2017 0839   BILITOT 1.01 01/24/2017 0839   GFRNONAA >60 05/18/2016 1250   GFRAA >60 05/18/2016 1250       Component Value Date/Time   WBC 8.2 02/08/2017 1144   WBC 4.9 01/24/2017 0839   WBC 7.6 05/18/2016 1250   RBC 4.86 02/08/2017 1144   HGB 14.1 01/24/2017 0839   HCT 43.0 02/08/2017 1144   HCT 42.0 01/24/2017 0839   PLT 205 02/08/2017 1144   PLT 190 01/24/2017 0839   MCV 88.5 02/08/2017 1144   MCV 88.9 01/24/2017 0839   MCH 30.2 02/08/2017 1144   MCHC 34.2 02/08/2017 1144   RDW 13.8 02/08/2017 1144   RDW 15.2 (H) 01/24/2017 0839   LYMPHSABS 0.4 (L) 02/08/2017 1144   LYMPHSABS 0.3 (L) 01/24/2017 0839   MONOABS 1.0 (H) 02/08/2017 1144   MONOABS 0.6 01/24/2017 0839   EOSABS 0.4 02/08/2017 1144   EOSABS 0.4 01/24/2017 0839   BASOSABS 0.0 02/08/2017 1144   BASOSABS 0.0 01/24/2017  0839   -------------------------------  Imaging from last 24 hours (if applicable):  Radiology interpretation: Dg Chest 2 View  Result Date: 02/08/2017 CLINICAL DATA:  Congestion and cough over the last 2-3 weeks. History of T-cell lymphoma. EXAM: CHEST  2 VIEW COMPARISON:  02/23/2016 FINDINGS: Heart size is normal. Power port on the right with the tip 3 cm above the right atrium. Mediastinal shadows are otherwise normal. The lungs are clear. No infiltrate, collapse or effusion. Ordinary degenerative changes affect the spine. IMPRESSION: No active disease.  Power port in place. Electronically Signed   By: Nelson Chimes M.D.   On: 02/08/2017 11:48        This case was discussed with Dr. [name]. He expressed agreement with my management of this patient.

## 2017-02-08 NOTE — Telephone Encounter (Signed)
Spoke with pt wife. Pt wife states that they have not been able to get a chest xray for pt through PCP. Per Dr. [name] and Sandi Mealy, PA, pt to be seen in our [facility] [address]. Pt will go to radiology first to get chest xray. Pt wife voiced understanding.

## 2017-02-10 ENCOUNTER — Other Ambulatory Visit: Payer: Self-pay | Admitting: Oncology

## 2017-02-15 ENCOUNTER — Inpatient Hospital Stay: Payer: 59

## 2017-02-15 ENCOUNTER — Telehealth: Payer: Self-pay | Admitting: Oncology

## 2017-02-15 ENCOUNTER — Inpatient Hospital Stay (HOSPITAL_BASED_OUTPATIENT_CLINIC_OR_DEPARTMENT_OTHER): Payer: 59 | Admitting: Oncology

## 2017-02-15 VITALS — BP 135/75 | HR 73 | Temp 98.0°F | Ht 68.0 in | Wt 197.4 lb

## 2017-02-15 DIAGNOSIS — Z923 Personal history of irradiation: Secondary | ICD-10-CM | POA: Diagnosis not present

## 2017-02-15 DIAGNOSIS — C8590 Non-Hodgkin lymphoma, unspecified, unspecified site: Secondary | ICD-10-CM

## 2017-02-15 DIAGNOSIS — C866 Primary cutaneous CD30-positive T-cell proliferations: Secondary | ICD-10-CM | POA: Diagnosis not present

## 2017-02-15 DIAGNOSIS — C8331 Diffuse large B-cell lymphoma, lymph nodes of head, face, and neck: Secondary | ICD-10-CM

## 2017-02-15 DIAGNOSIS — G620 Drug-induced polyneuropathy: Secondary | ICD-10-CM

## 2017-02-15 DIAGNOSIS — Z5112 Encounter for antineoplastic immunotherapy: Secondary | ICD-10-CM | POA: Diagnosis not present

## 2017-02-15 DIAGNOSIS — Z9221 Personal history of antineoplastic chemotherapy: Secondary | ICD-10-CM

## 2017-02-15 LAB — COMPREHENSIVE METABOLIC PANEL
ALK PHOS: 35 U/L — AB (ref 40–150)
ALT: 25 U/L (ref 0–55)
AST: 27 U/L (ref 5–34)
Albumin: 3.9 g/dL (ref 3.5–5.0)
Anion gap: 10 (ref 3–11)
BILIRUBIN TOTAL: 0.7 mg/dL (ref 0.2–1.2)
BUN: 16 mg/dL (ref 7–26)
CALCIUM: 9.5 mg/dL (ref 8.4–10.4)
CO2: 28 mmol/L (ref 22–29)
CREATININE: 0.95 mg/dL (ref 0.70–1.30)
Chloride: 102 mmol/L (ref 98–109)
GFR calc Af Amer: >60 mL/min (ref >60)
Glucose, Bld: 129 mg/dL (ref 70–140)
Potassium: 3.9 mmol/L (ref 3.5–5.1)
Sodium: 140 mmol/L (ref 136–145)
Total Protein: 6.8 g/dL (ref 6.4–8.3)

## 2017-02-15 LAB — CBC WITH DIFFERENTIAL/PLATELET
BASOS PCT: 1 %
Basophils Absolute: 0.1 10*3/uL (ref 0.0–0.1)
Eosinophils Absolute: 0.4 10*3/uL (ref 0.0–0.5)
Eosinophils Relative: 7 %
HEMATOCRIT: 41.3 % (ref 38.4–49.9)
HEMOGLOBIN: 13.9 g/dL (ref 13.0–17.1)
LYMPHS ABS: 0.5 10*3/uL — AB (ref 0.9–3.3)
Lymphocytes Relative: 9 %
MCH: 29.7 pg (ref 27.2–33.4)
MCHC: 33.6 g/dL (ref 32.0–36.0)
MCV: 88.3 fL (ref 79.3–98.0)
MONOS PCT: 10 %
Monocytes Absolute: 0.5 10*3/uL (ref 0.1–0.9)
NEUTROS ABS: 3.9 10*3/uL (ref 1.5–6.5)
NEUTROS PCT: 73 %
Platelets: 264 10*3/uL (ref 140–400)
RBC: 4.68 MIL/uL (ref 4.20–5.82)
RDW: 14.3 % (ref 11.0–15.6)
WBC: 5.4 10*3/uL (ref 4.0–10.3)

## 2017-02-15 MED ORDER — SODIUM CHLORIDE 0.9 % IV SOLN
Freq: Once | INTRAVENOUS | Status: AC
  Administered 2017-02-15: 15:00:00 via INTRAVENOUS

## 2017-02-15 MED ORDER — ACETAMINOPHEN 325 MG PO TABS
ORAL_TABLET | ORAL | Status: AC
  Filled 2017-02-15: qty 2

## 2017-02-15 MED ORDER — DIPHENHYDRAMINE HCL 50 MG/ML IJ SOLN
25.0000 mg | Freq: Once | INTRAMUSCULAR | Status: AC
  Administered 2017-02-15: 25 mg via INTRAVENOUS

## 2017-02-15 MED ORDER — HEPARIN SOD (PORK) LOCK FLUSH 100 UNIT/ML IV SOLN
500.0000 [IU] | Freq: Once | INTRAVENOUS | Status: AC | PRN
  Administered 2017-02-15: 500 [IU]
  Filled 2017-02-15: qty 5

## 2017-02-15 MED ORDER — SODIUM CHLORIDE 0.9% FLUSH
10.0000 mL | INTRAVENOUS | Status: DC | PRN
  Administered 2017-02-15: 10 mL
  Filled 2017-02-15: qty 10

## 2017-02-15 MED ORDER — ACETAMINOPHEN 325 MG PO TABS
650.0000 mg | ORAL_TABLET | Freq: Once | ORAL | Status: AC
  Administered 2017-02-15: 650 mg via ORAL

## 2017-02-15 MED ORDER — SODIUM CHLORIDE 0.9 % IV SOLN
0.8500 mg/kg | Freq: Once | INTRAVENOUS | Status: AC
  Administered 2017-02-15: 75 mg via INTRAVENOUS
  Filled 2017-02-15: qty 15

## 2017-02-15 MED ORDER — DIPHENHYDRAMINE HCL 50 MG/ML IJ SOLN
INTRAMUSCULAR | Status: AC
  Filled 2017-02-15: qty 1

## 2017-02-15 NOTE — Patient Instructions (Signed)

## 2017-02-15 NOTE — Progress Notes (Signed)
[name] OFFICE PROGRESS NOTE   Diagnosis: Non-Hodgkin's lymphoma  INTERVAL HISTORY:   [name] returns as scheduled.  He completed another treatment with brentuximab 01/24/2017. He reports a 2-week episode of cough and upper chest congestion.  He was seen in the symptom management clinic 02/08/2017.  A chest x-ray revealed no acute change.  He was treated with a course of azithromycin and his symptoms resolved.  He reports no change in the neuropathy symptoms.  This does not have a significant impact on his activity.   Objective:  Vital signs in last 24 hours:  Blood pressure 135/75, pulse 73, temperature 98 F (36.7 C), temperature source Oral, height _0  (1.727 m), weight 197 lb 6.4 oz (89.5 kg), SpO2 98 %.    HEENT: No thrush or ulcers Resp: Lungs clear bilaterally Cardio: Regular rate and rhythm GI: No hepatosplenomegaly Vascular: No leg edema    Portacath/PICC-without erythema  Lab Results:  Lab Results  Component Value Date   WBC 5.4 02/15/2017   HGB 13.9 02/15/2017   HCT 41.3 02/15/2017   MCV 88.3 02/15/2017   PLT 264 02/15/2017   NEUTROABS 3.9 02/15/2017    CMP     Component Value Date/Time   NA 140 01/24/2017 0839   K 4.4 01/24/2017 0839   CL 106 05/18/2016 1250   CO2 24 01/24/2017 0839   GLUCOSE 99 01/24/2017 0839   BUN 16.8 01/24/2017 0839   CREATININE 0.9 01/24/2017 0839   CALCIUM 9.2 01/24/2017 0839   PROT 6.5 01/24/2017 0839   ALBUMIN 3.9 01/24/2017 0839   AST 26 01/24/2017 0839   ALT 33 01/24/2017 0839   ALKPHOS 28 (L) 01/24/2017 0839   BILITOT 1.01 01/24/2017 0839   GFRNONAA >60 05/18/2016 1250   GFRAA >60 05/18/2016 1250    Medications: I have reviewed the patient's current medications.   Assessment/Plan: 1. 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  PET scan 03/20/2016 with extensive bulky hypermetabolic nodal activity in the neck, chest, abdomen and pelvis; mild splenomegaly with diffusely mildly accentuated splenic activity  Staging bone marrow biopsy 03/23/2016-negative for involvement with lymphoma  Cycle 1 EPOCH beginning 03/23/2016  Cycle 2 [facility] 04/13/2016  Restaging PET scan at [facility] 05/08/2016-lymph nodes with various degrees of FDG activity in the neck, axilla, right written him, mesentery, pelvis, and groin. Indeterminate liver lesions without FDG activity, nodular mass in the posterior medial aspect of the left thigh  Cycle 3 EPOCH04/27/2018  Cycle 1 CVP 06/21/2016  Cytoxan/prednisone plus brentuximab 07/17/2016  Cytoxan/prednisone plus brentuximab 08/07/2016 (dose reduced)  Initiation of every three-week brentuximab08/08/2016  brentuximabdose reduced 10/31/2016 secondary to neuropathy  Brentuximab further dose reduced 01/03/2017 secondary to neuropathy 2. 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 He entered clinical remission following chemotherapy and remained in remission when he was seen at the cancer center 02/24/2009. 3. 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 Staging PET scan 08/23/2010 with increased FDG activity at the left tonsillar fossa and no additional evidence  of lymphoma. He completed 4 cycles of R-ICE with cycle #1 beginning on 09/19/2010 and cycle #4 on 11/21/2010. Repeat head and neck examination by Dr. Constance Holster following  R-ICE/rituximab showed no residual lymphoma. He began radiation consolidation on 12/18/2010, radiation was completed on 01/22/2011. 4. History of neutropenia secondary to rituximab. 5. Anemia secondary to chemotherapy, status post a red blood cell transfusion 11/30/2010. The hemoglobin has normalized. 6. History of mild thrombocytopenia secondary to chemotherapy. 7. Hypertension. 8. 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 05/08/2017-nodular mass in the left thigh adjacent to the femur  MRI 06/07/2016-infiltrative mass in the left thigh adductor muscle, separate from the schwannoma excision site  MRI 10/08/2016-marked improvement in the left adductormusclemass with a small area of enhancement remaining, no discrete mass 9. Port-A-Cath placement 03/21/2016 10. Superficial thrombus left greater saphenous vein 04/13/2016. Lovenox initiated, converted to Xarelto beginning 04/18/2016 11. 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  He has recovered from a recent upper respiratory infection.  He has stable neuropathy symptoms.  He will complete another treatment with brentuximab today.  The plan is to complete 1-2 additional courses of brentuximab after today.  He will return for an office visit and brentuximab in 3 weeks.  15 minutes were spent with the patient today.  The majority of the time was used for counseling and coordination of care.  [name], MD  02/15/2017  2:29 PM

## 2017-02-15 NOTE — Telephone Encounter (Signed)
Gave avs and calendar for march °

## 2017-02-15 NOTE — Patient Instructions (Signed)
[name] Discharge Instructions for Patients Receiving Chemotherapy  Today you received the following chemotherapy agents: Brentuximab vedotin (Adcetris)  To help prevent nausea and vomiting after your treatment, we encourage you to take your nausea medication as prescribed.  If you develop nausea and vomiting that is not controlled by your nausea medication, call the clinic.   BELOW ARE SYMPTOMS THAT SHOULD BE REPORTED IMMEDIATELY:  *FEVER GREATER THAN 100.5 F  *CHILLS WITH OR WITHOUT FEVER  NAUSEA AND VOMITING THAT IS NOT CONTROLLED WITH YOUR NAUSEA MEDICATION  *UNUSUAL SHORTNESS OF BREATH  *UNUSUAL BRUISING OR BLEEDING  TENDERNESS IN MOUTH AND THROAT WITH OR WITHOUT PRESENCE OF ULCERS  *URINARY PROBLEMS  *BOWEL PROBLEMS  UNUSUAL RASH Items with * indicate a potential emergency and should be followed up as soon as possible.  Feel free to call the clinic should you have any questions or concerns. The clinic phone number is [phone].  Please show the [place] at check-in to the Emergency Department and triage nurse.

## 2017-02-20 ENCOUNTER — Telehealth: Payer: Self-pay

## 2017-02-20 NOTE — Telephone Encounter (Signed)
LVM for pt to return call to clinic regarding symptoms

## 2017-02-20 NOTE — Telephone Encounter (Signed)
Spoke with pt and wife regarding respiratory symptoms. Pt states he is currently out of town and won't return until tomorrow after clinic hours. Pt wife states that pt has had a low grade fever at 99.5. Denies, SOB or any higher temperatures. Per MD, patient to monitor symptoms and call if any worsening or SOB develops. Pt and wife voiced understanding.

## 2017-03-08 ENCOUNTER — Encounter: Payer: Self-pay | Admitting: Nurse Practitioner

## 2017-03-08 ENCOUNTER — Inpatient Hospital Stay: Payer: 59

## 2017-03-08 ENCOUNTER — Inpatient Hospital Stay: Payer: 59 | Attending: Oncology | Admitting: Nurse Practitioner

## 2017-03-08 VITALS — BP 110/75 | HR 68 | Temp 98.5°F | Resp 18 | Ht 68.0 in | Wt 196.7 lb

## 2017-03-08 DIAGNOSIS — L270 Generalized skin eruption due to drugs and medicaments taken internally: Secondary | ICD-10-CM | POA: Insufficient documentation

## 2017-03-08 DIAGNOSIS — R109 Unspecified abdominal pain: Secondary | ICD-10-CM | POA: Insufficient documentation

## 2017-03-08 DIAGNOSIS — Z95828 Presence of other vascular implants and grafts: Secondary | ICD-10-CM

## 2017-03-08 DIAGNOSIS — C8590 Non-Hodgkin lymphoma, unspecified, unspecified site: Secondary | ICD-10-CM

## 2017-03-08 DIAGNOSIS — C866 Primary cutaneous CD30-positive T-cell proliferations: Secondary | ICD-10-CM | POA: Diagnosis not present

## 2017-03-08 DIAGNOSIS — G620 Drug-induced polyneuropathy: Secondary | ICD-10-CM | POA: Diagnosis not present

## 2017-03-08 DIAGNOSIS — Z5112 Encounter for antineoplastic immunotherapy: Secondary | ICD-10-CM | POA: Diagnosis present

## 2017-03-08 LAB — CBC WITH DIFFERENTIAL/PLATELET
BASOS ABS: 0 10*3/uL (ref 0.0–0.1)
Basophils Relative: 1 %
Eosinophils Absolute: 0.3 10*3/uL (ref 0.0–0.5)
Eosinophils Relative: 8 %
HCT: 41.9 % (ref 38.4–49.9)
HEMOGLOBIN: 14 g/dL (ref 13.0–17.1)
LYMPHS ABS: 0.3 10*3/uL — AB (ref 0.9–3.3)
LYMPHS PCT: 8 %
MCH: 29.4 pg (ref 27.2–33.4)
MCHC: 33.4 g/dL (ref 32.0–36.0)
MCV: 88 fL (ref 79.3–98.0)
Monocytes Absolute: 0.6 10*3/uL (ref 0.1–0.9)
Monocytes Relative: 16 %
NEUTROS PCT: 67 %
Neutro Abs: 2.7 10*3/uL (ref 1.5–6.5)
Platelets: 192 10*3/uL (ref 140–400)
RBC: 4.76 MIL/uL (ref 4.20–5.82)
RDW: 14.4 % (ref 11.0–14.6)
WBC: 3.9 10*3/uL — AB (ref 4.0–10.3)

## 2017-03-08 LAB — COMPREHENSIVE METABOLIC PANEL
ALT: 26 U/L (ref 0–55)
AST: 28 U/L (ref 5–34)
Albumin: 4.1 g/dL (ref 3.5–5.0)
Alkaline Phosphatase: 36 U/L — ABNORMAL LOW (ref 40–150)
Anion gap: 11 (ref 3–11)
BUN: 15 mg/dL (ref 7–26)
CHLORIDE: 102 mmol/L (ref 98–109)
CO2: 27 mmol/L (ref 22–29)
Calcium: 9.7 mg/dL (ref 8.4–10.4)
Creatinine, Ser: 1.44 mg/dL — ABNORMAL HIGH (ref 0.70–1.30)
GFR, EST NON AFRICAN AMERICAN: 53 mL/min — AB (ref >60)
Glucose, Bld: 97 mg/dL (ref 70–140)
POTASSIUM: 4 mmol/L (ref 3.5–5.1)
Sodium: 140 mmol/L (ref 136–145)
Total Bilirubin: 1.1 mg/dL (ref 0.2–1.2)
Total Protein: 6.8 g/dL (ref 6.4–8.3)

## 2017-03-08 MED ORDER — HEPARIN SOD (PORK) LOCK FLUSH 100 UNIT/ML IV SOLN
500.0000 [IU] | Freq: Once | INTRAVENOUS | Status: AC
  Administered 2017-03-08: 500 [IU]
  Filled 2017-03-08: qty 5

## 2017-03-08 MED ORDER — SODIUM CHLORIDE 0.9% FLUSH
10.0000 mL | Freq: Once | INTRAVENOUS | Status: AC
  Administered 2017-03-08: 10 mL
  Filled 2017-03-08: qty 10

## 2017-03-08 NOTE — Patient Instructions (Signed)
Implanted Port Home Guide An implanted port is a type of central line that is placed under the skin. Central lines are used to provide IV access when treatment or nutrition needs to be given through a person's veins. Implanted ports are used for long-term IV access. An implanted port may be placed because:  You need IV medicine that would be irritating to the small veins in your hands or arms.  You need long-term IV medicines, such as antibiotics.  You need IV nutrition for a long period.  You need frequent blood draws for lab tests.  You need dialysis.  Implanted ports are usually placed in the chest area, but they can also be placed in the upper arm, the abdomen, or the leg. An implanted port has two main parts:  Reservoir. The reservoir is round and will appear as a small, raised area under your skin. The reservoir is the part where a needle is inserted to give medicines or draw blood.  Catheter. The catheter is a thin, flexible tube that extends from the reservoir. The catheter is placed into a large vein. Medicine that is inserted into the reservoir goes into the catheter and then into the vein.  How will I care for my incision site? Do not get the incision site wet. Bathe or shower as directed by your health care provider. How is my port accessed? Special steps must be taken to access the port:  Before the port is accessed, a numbing cream can be placed on the skin. This helps numb the skin over the port site.  Your health care provider uses a sterile technique to access the port. ? Your health care provider must put on a mask and sterile gloves. ? The skin over your port is cleaned carefully with an antiseptic and allowed to dry. ? The port is gently pinched between sterile gloves, and a needle is inserted into the port.  Only "non-coring" port needles should be used to access the port. Once the port is accessed, a blood return should be checked. This helps ensure that the port  is in the vein and is not clogged.  If your port needs to remain accessed for a constant infusion, a clear (transparent) bandage will be placed over the needle site. The bandage and needle will need to be changed every week, or as directed by your health care provider.  Keep the bandage covering the needle clean and dry. Do not get it wet. Follow your health care provider's instructions on how to take a shower or bath while the port is accessed.  If your port does not need to stay accessed, no bandage is needed over the port.  What is flushing? Flushing helps keep the port from getting clogged. Follow your health care provider's instructions on how and when to flush the port. Ports are usually flushed with saline solution or a medicine called heparin. The need for flushing will depend on how the port is used.  If the port is used for intermittent medicines or blood draws, the port will need to be flushed: ? After medicines have been given. ? After blood has been drawn. ? As part of routine maintenance.  If a constant infusion is running, the port may not need to be flushed.  How long will my port stay implanted? The port can stay in for as long as your health care provider thinks it is needed. When it is time for the port to come out, surgery will be   done to remove it. The procedure is similar to the one performed when the port was put in. When should I seek immediate medical care? When you have an implanted port, you should seek immediate medical care if:  You notice a bad smell coming from the incision site.  You have swelling, redness, or drainage at the incision site.  You have more swelling or pain at the port site or the surrounding area.  You have a fever that is not controlled with medicine.  This information is not intended to replace advice given to you by your health care provider. Make sure you discuss any questions you have with your health care provider. Document  Released: 01/08/2005 Document Revised: 06/16/2015 Document Reviewed: 09/15/2012 Elsevier Interactive Patient Education  2017 Elsevier Inc.  

## 2017-03-08 NOTE — Progress Notes (Signed)
[place] OFFICE PROGRESS NOTE   Diagnosis: Non-Hodgkin's lymphoma  INTERVAL HISTORY:  Mr. Asmar returns as scheduled.  He completed another treatment with brentuximab 02/15/2017.  He denies nausea/vomiting.  No mouth sores.  No diarrhea.  No fevers or sweats.  Cough is better.  He continues to note early morning "congestion".  No shortness of breath.  Neuropathy symptoms are stable.  His wife noticed a "bump" on his head about 2 weeks ago.  She thinks this has increased in size.  Objective:  Vital signs in last 24 hours:  Blood pressure 110/75, pulse 68, temperature 98.5 F (36.9 C), temperature source Oral, resp. rate 18, height _0  (1.727 m), weight 196 lb 11.2 oz (89.2 kg), SpO2 98 %.    HEENT: No thrush or ulcers. Lymphatics: No palpable cervical, supraclavicular, axillary or inguinal lymph nodes. Resp: Lungs clear bilaterally. Cardio: Regular rate and rhythm. GI: Abdomen soft and nontender.  No hepatosplenomegaly. Vascular: No leg edema. Neuro: Alert and oriented.  Moderate decrease in vibratory sense over the fingertips per tuning fork exam. Musculoskeletal: Approximate 3 cm firm lesion posterior parietal region, nontender, mildly erythematous. Port-A-Cath without erythema.  Lab Results:  Lab Results  Component Value Date   WBC 3.9 (L) 03/08/2017   HGB 14.0 03/08/2017   HCT 41.9 03/08/2017   MCV 88.0 03/08/2017   PLT 192 03/08/2017   NEUTROABS 2.7 03/08/2017    Imaging:  No results found.  Medications: I have reviewed the patient's current medications.  Assessment/Plan: 1. 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  PET scan 03/20/2016 with extensive bulky hypermetabolic nodal activity in the neck, chest, abdomen and pelvis; mild splenomegaly with diffusely mildly accentuated splenic activity  Staging bone marrow biopsy 03/23/2016-negative for involvement with lymphoma  Cycle 1 EPOCH beginning 03/23/2016  Cycle 2 [facility] 04/13/2016  Restaging PET scan at [facility] 05/08/2016-lymph nodes with various degrees of FDG activity in the neck, axilla, right written him, mesentery, pelvis, and groin. Indeterminate liver lesions without FDG activity, nodular mass in the posterior medial aspect of the left thigh  Cycle 3 EPOCH04/27/2018  Cycle 1 CVP 06/21/2016  Cytoxan/prednisone plus brentuximab 07/17/2016  Cytoxan/prednisone plus brentuximab 08/07/2016 (dose reduced)  Initiation of every three-week brentuximab08/08/2016  brentuximabdose reduced 10/31/2016 secondary to neuropathy  Brentuximab further dose reduced 01/03/2017 secondary to neuropathy 2. 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 He entered clinical remission following chemotherapy and remained in remission when he was seen at the cancer center 02/24/2009. 3. 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 Staging PET scan 08/23/2010 with increased FDG activity at the left tonsillar fossa and no additional evidence of lymphoma. He completed 4 cycles of R-ICE with cycle #1 beginning on 09/19/2010 and cycle #4 on 11/21/2010. Repeat head and neck examination by Dr. Constance Holster following R-ICE/rituximab showed no residual lymphoma. He began radiation consolidation on 12/18/2010, radiation was completed on 01/22/2011. 4. History of neutropenia secondary to  rituximab. 5. Anemia secondary to chemotherapy, status post a red blood  cell transfusion 11/30/2010. The hemoglobin has normalized. 6. History of mild thrombocytopenia secondary to chemotherapy. 7. Hypertension. 8. 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 05/08/2017-nodular mass in the left thigh adjacent to the femur  MRI 06/07/2016-infiltrative mass in the left thigh adductor muscle, separate from the schwannoma excision site  MRI 10/08/2016-marked improvement in the left adductormusclemass with a small area of enhancement remaining, no discrete mass 9. Port-A-Cath placement 03/21/2016 10. Superficial thrombus left greater saphenous vein 04/13/2016. Lovenox initiated, converted to Xarelto beginning 04/18/2016 11. 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 Slaven appears stable.  He was last treated with brentuximab 02/15/2017.  On exam today he has a firm scalp lesion located in the parietal region, present approximately 2 weeks, increasing in size per wife's report.  Remainder of exam is unremarkable and he is having no symptoms to suggest progression of the lymphoma.  On today's blood work the creatinine is mildly elevated, unclear etiology.  I reviewed the above with Dr. [name].  We decided to hold today's treatment with brentuximab and refer him for a restaging with PET scan to include the whole body.  With regard to the elevated creatinine he will increase fluid intake and return for repeat labs next week.  He is going to be out of town until 03/15/2017 so we will try to have the labs done late that day and schedule the PET scan on 03/18/2017.  He will return for a follow-up visit on 03/21/2017.  He will contact the office in the interim with any problems.  25 minutes were spent face-to-face at today's visit with  the majority of that time involved in counseling/coordination of care.  [name] ANP/GNP-BC   03/08/2017  2:38 PM

## 2017-03-11 ENCOUNTER — Telehealth: Payer: Self-pay | Admitting: Oncology

## 2017-03-11 ENCOUNTER — Telehealth: Payer: Self-pay

## 2017-03-11 NOTE — Telephone Encounter (Signed)
Scheduled appt per 2/18 sch message - pt is aware of appt date and time   

## 2017-03-11 NOTE — Telephone Encounter (Signed)
Wife called to report that pt will be coming home sooner because "he hasn't been feeling well". Wife states he has a "pressure in lower stomach causing him not to be able to sleep through the night at all", this is a "new finding" per pt wife. Per Dr. [name] pt to come in Wednesday for labs/visit. PET scan scheduled for Friday. Spoke with pt wife to inform of appts, voiced understanding. Also, pt to arrive 12:30 @ [facility] for scan appt at 1pm. NPO 6 hrs prior to appt, wife voiced understanding.

## 2017-03-13 ENCOUNTER — Inpatient Hospital Stay: Payer: 59

## 2017-03-13 ENCOUNTER — Inpatient Hospital Stay (HOSPITAL_BASED_OUTPATIENT_CLINIC_OR_DEPARTMENT_OTHER): Payer: 59 | Admitting: Oncology

## 2017-03-13 ENCOUNTER — Telehealth: Payer: Self-pay

## 2017-03-13 VITALS — BP 117/69 | HR 62 | Temp 98.4°F | Resp 18 | Ht 68.0 in | Wt 197.1 lb

## 2017-03-13 DIAGNOSIS — L270 Generalized skin eruption due to drugs and medicaments taken internally: Secondary | ICD-10-CM

## 2017-03-13 DIAGNOSIS — R109 Unspecified abdominal pain: Secondary | ICD-10-CM

## 2017-03-13 DIAGNOSIS — G620 Drug-induced polyneuropathy: Secondary | ICD-10-CM

## 2017-03-13 DIAGNOSIS — C8590 Non-Hodgkin lymphoma, unspecified, unspecified site: Secondary | ICD-10-CM

## 2017-03-13 DIAGNOSIS — Z95828 Presence of other vascular implants and grafts: Secondary | ICD-10-CM

## 2017-03-13 DIAGNOSIS — C866 Primary cutaneous CD30-positive T-cell proliferations: Secondary | ICD-10-CM

## 2017-03-13 LAB — CMP (CANCER CENTER ONLY)
ALK PHOS: 32 U/L — AB (ref 40–150)
ALT: 28 U/L (ref 0–55)
AST: 27 U/L (ref 5–34)
Albumin: 3.9 g/dL (ref 3.5–5.0)
Anion gap: 10 (ref 3–11)
BUN: 12 mg/dL (ref 7–26)
CALCIUM: 9.5 mg/dL (ref 8.4–10.4)
CHLORIDE: 104 mmol/L (ref 98–109)
CO2: 26 mmol/L (ref 22–29)
CREATININE: 0.91 mg/dL (ref 0.70–1.30)
Glucose, Bld: 95 mg/dL (ref 70–140)
Potassium: 4.1 mmol/L (ref 3.5–5.1)
Sodium: 140 mmol/L (ref 136–145)
Total Bilirubin: 1 mg/dL (ref 0.2–1.2)
Total Protein: 6.7 g/dL (ref 6.4–8.3)

## 2017-03-13 MED ORDER — SODIUM CHLORIDE 0.9% FLUSH
10.0000 mL | Freq: Once | INTRAVENOUS | Status: AC
  Administered 2017-03-13: 10 mL
  Filled 2017-03-13: qty 10

## 2017-03-13 MED ORDER — DOXYCYCLINE HYCLATE 100 MG PO TABS: 100.0000 mg | ORAL_TABLET | Freq: Two times a day (BID) | ORAL | 0 refills | Status: DC

## 2017-03-13 MED ORDER — HEPARIN SOD (PORK) LOCK FLUSH 100 UNIT/ML IV SOLN
500.0000 [IU] | Freq: Once | INTRAVENOUS | Status: AC
  Administered 2017-03-13: 500 [IU]
  Filled 2017-03-13: qty 5

## 2017-03-13 NOTE — Progress Notes (Signed)
[place] OFFICE PROGRESS NOTE   Diagnosis: Non-Hodgkin's lymphoma  INTERVAL HISTORY:   [name] was noted to have an erythematous raised lesion at the posterior parietal scalp when he was here on 03/08/2017.  Brentuximab was held.  The creatinine returned mildly elevated on 03/08/2017. The scalp lesion persists.  He reports low abdominal discomfort for the past several days.  This makes it difficult to sleep.  The pain improves after he has a bowel movement.  He is not constipated.  No bleeding.  No nausea.  No fever, night sweats, or palpable lymph nodes. He returned early from a business trip secondary to the abdominal discomfort.  Objective:  Vital signs in last 24 hours:  Blood pressure 117/69, pulse 62, temperature 98.4 F (36.9 C), temperature source Oral, resp. rate 18, height '5\' 8"'$  (1.727 m), weight 197 lb 1.6 oz (89.4 kg), SpO2 99 %.    HEENT: Neck without mass Lymphatics: No cervical, supraclavicular, axillary, or inguinal nodes Resp: Lungs clear bilaterally Cardio: Regular rate and rhythm GI: No hepatosplenomegaly, no mass, nontender Vascular: No leg edema  Skin: Approximate 2 cm erythematous slightly raised area at the posterior parietal scalp.  Similar less raised areas throughout the superior parietal scalp.  Some of the areas appear to have a central pustule.  1 cm nodular erythematous lesion with a central head in the right axilla  Portacath/PICC-without erythema  Lab Results:  Lab Results  Component Value Date   WBC 3.9 (L) 03/08/2017   HGB 14.0 03/08/2017   HCT 41.9 03/08/2017   MCV 88.0 03/08/2017   PLT 192 03/08/2017   NEUTROABS 2.7 03/08/2017    CMP     Component Value Date/Time   NA 140 03/13/2017 1108   NA 140 01/24/2017 0839   K 4.1 03/13/2017 1108   K 4.4 01/24/2017 0839   CL 104 03/13/2017 1108   CO2 26 03/13/2017 1108   CO2 24 01/24/2017 0839   GLUCOSE 95 03/13/2017 1108   GLUCOSE 99 01/24/2017 0839   BUN 12 03/13/2017  1108   BUN 16.8 01/24/2017 0839   CREATININE 0.91 03/13/2017 1108   CREATININE 0.9 01/24/2017 0839   CALCIUM 9.5 03/13/2017 1108   CALCIUM 9.2 01/24/2017 0839   PROT 6.7 03/13/2017 1108   PROT 6.5 01/24/2017 0839   ALBUMIN 3.9 03/13/2017 1108   ALBUMIN 3.9 01/24/2017 0839   AST 27 03/13/2017 1108   AST 26 01/24/2017 0839   ALT 28 03/13/2017 1108   ALT 33 01/24/2017 0839   ALKPHOS 32 (L) 03/13/2017 1108   ALKPHOS 28 (L) 01/24/2017 0839   BILITOT 1.0 03/13/2017 1108   BILITOT 1.01 01/24/2017 0839   GFRNONAA >60 03/13/2017 1108   GFRAA >60 03/13/2017 1108     Medications: I have reviewed the patient's current medications.   Assessment/Plan: 1. 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  PET scan 03/20/2016 with extensive bulky hypermetabolic nodal activity in the neck, chest, abdomen and pelvis; mild splenomegaly with diffusely mildly accentuated splenic activity  Staging bone marrow biopsy 03/23/2016-negative for involvement with lymphoma  Cycle 1 EPOCH beginning 03/23/2016  Cycle 2 [facility] 04/13/2016  Restaging PET scan at [facility] 05/08/2016-lymph nodes  with various degrees of FDG activity in the neck, axilla, right written him, mesentery, pelvis, and groin. Indeterminate liver lesions without FDG activity, nodular mass in the posterior medial aspect of the left thigh  Cycle 3 EPOCH04/27/2018  Cycle 1 CVP 06/21/2016  Cytoxan/prednisone plus brentuximab 07/17/2016  Cytoxan/prednisone plus brentuximab 08/07/2016 (dose reduced)  Initiation of every three-week  brentuximab08/08/2016  brentuximabdose reduced 10/31/2016 secondary to neuropathy  Brentuximab further dose reduced 01/03/2017 secondary to neuropathy 2. 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 He entered clinical remission following chemotherapy and remained in remission when he was seen at the cancer center 02/24/2009. 3. 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 Staging PET scan 08/23/2010 with increased FDG activity at the left tonsillar fossa and no additional evidence of lymphoma. He completed 4 cycles of R-ICE with cycle #1 beginning on 09/19/2010 and cycle #4 on 11/21/2010. Repeat head and neck examination by Dr. Constance Holster following R-ICE/rituximab showed no residual lymphoma. He began radiation consolidation on 12/18/2010, radiation was completed on 01/22/2011. 4. History of neutropenia secondary to rituximab. 5. Anemia secondary to chemotherapy, status post a red blood cell transfusion 11/30/2010. The hemoglobin has normalized. 6. History of mild thrombocytopenia secondary to chemotherapy. 7. Hypertension. 8. 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 05/08/2017-nodular mass in the left thigh adjacent to the femur  MRI 06/07/2016-infiltrative mass in the left thigh adductor muscle, separate from the schwannoma excision site  MRI 10/08/2016-marked improvement in the left adductormusclemass with a small area of enhancement remaining, no discrete mass 9. Port-A-Cath placement 03/21/2016 10. Superficial thrombus left greater saphenous vein 04/13/2016. Lovenox initiated, converted to Xarelto beginning 04/18/2016 11. 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 Geffre has been treated with single agent rituximab since August 2018.  Treatment has been complicated by peripheral neuropathy.  There is been no evidence for progression of lymphoma.  He now has a raised erythematous lesion at the posterior parietal scalp with other similar areas throughout the parietal scalp.  There is a nodular cutaneous lesion in the right axilla.  These areas could represent progression of lymphoma or an infection.  We prescribed doxycycline.  The etiology of the abdominal pain is unclear.  The creatinine was mildly elevated on 03/08/2017 and is normal today.  He is scheduled for a restaging PET scan on 03/15/2017.  He will return for an office visit that day.  25 minutes were spent with the patient today.  The majority of the time was used for counseling and coordination of care.  [name], MD  03/13/2017  1:06 PM

## 2017-03-13 NOTE — Patient Instructions (Signed)
Implanted Port Home Guide An implanted port is a type of central line that is placed under the skin. Central lines are used to provide IV access when treatment or nutrition needs to be given through a person's veins. Implanted ports are used for long-term IV access. An implanted port may be placed because:  You need IV medicine that would be irritating to the small veins in your hands or arms.  You need long-term IV medicines, such as antibiotics.  You need IV nutrition for a long period.  You need frequent blood draws for lab tests.  You need dialysis.  Implanted ports are usually placed in the chest area, but they can also be placed in the upper arm, the abdomen, or the leg. An implanted port has two main parts:  Reservoir. The reservoir is round and will appear as a small, raised area under your skin. The reservoir is the part where a needle is inserted to give medicines or draw blood.  Catheter. The catheter is a thin, flexible tube that extends from the reservoir. The catheter is placed into a large vein. Medicine that is inserted into the reservoir goes into the catheter and then into the vein.  How will I care for my incision site? Do not get the incision site wet. Bathe or shower as directed by your health care provider. How is my port accessed? Special steps must be taken to access the port:  Before the port is accessed, a numbing cream can be placed on the skin. This helps numb the skin over the port site.  Your health care provider uses a sterile technique to access the port. ? Your health care provider must put on a mask and sterile gloves. ? The skin over your port is cleaned carefully with an antiseptic and allowed to dry. ? The port is gently pinched between sterile gloves, and a needle is inserted into the port.  Only "non-coring" port needles should be used to access the port. Once the port is accessed, a blood return should be checked. This helps ensure that the port  is in the vein and is not clogged.  If your port needs to remain accessed for a constant infusion, a clear (transparent) bandage will be placed over the needle site. The bandage and needle will need to be changed every week, or as directed by your health care provider.  Keep the bandage covering the needle clean and dry. Do not get it wet. Follow your health care provider's instructions on how to take a shower or bath while the port is accessed.  If your port does not need to stay accessed, no bandage is needed over the port.  What is flushing? Flushing helps keep the port from getting clogged. Follow your health care provider's instructions on how and when to flush the port. Ports are usually flushed with saline solution or a medicine called heparin. The need for flushing will depend on how the port is used.  If the port is used for intermittent medicines or blood draws, the port will need to be flushed: ? After medicines have been given. ? After blood has been drawn. ? As part of routine maintenance.  If a constant infusion is running, the port may not need to be flushed.  How long will my port stay implanted? The port can stay in for as long as your health care provider thinks it is needed. When it is time for the port to come out, surgery will be   done to remove it. The procedure is similar to the one performed when the port was put in. When should I seek immediate medical care? When you have an implanted port, you should seek immediate medical care if:  You notice a bad smell coming from the incision site.  You have swelling, redness, or drainage at the incision site.  You have more swelling or pain at the port site or the surrounding area.  You have a fever that is not controlled with medicine.  This information is not intended to replace advice given to you by your health care provider. Make sure you discuss any questions you have with your health care provider. Document  Released: 01/08/2005 Document Revised: 06/16/2015 Document Reviewed: 09/15/2012 Elsevier Interactive Patient Education  2017 Elsevier Inc.  

## 2017-03-13 NOTE — Telephone Encounter (Signed)
Printed avs and calender of upcoming appointment. Per 2/20 los. 

## 2017-03-15 ENCOUNTER — Telehealth: Payer: Self-pay | Admitting: Oncology

## 2017-03-15 ENCOUNTER — Other Ambulatory Visit: Payer: Self-pay

## 2017-03-15 ENCOUNTER — Ambulatory Visit (HOSPITAL_COMMUNITY)
Admission: RE | Admit: 2017-03-15 | Discharge: 2017-03-15 | Disposition: A | Discharge status: Home / Self Care | Payer: 59 | Source: Ambulatory Visit | Attending: Nurse Practitioner | Admitting: Nurse Practitioner

## 2017-03-15 ENCOUNTER — Inpatient Hospital Stay (HOSPITAL_BASED_OUTPATIENT_CLINIC_OR_DEPARTMENT_OTHER): Payer: 59 | Admitting: Oncology

## 2017-03-15 ENCOUNTER — Telehealth: Payer: Self-pay | Admitting: *Deleted

## 2017-03-15 VITALS — BP 141/89 | HR 74 | Temp 98.2°F | Resp 20 | Ht 68.0 in | Wt 196.1 lb

## 2017-03-15 DIAGNOSIS — C866 Primary cutaneous CD30-positive T-cell proliferations: Secondary | ICD-10-CM | POA: Diagnosis not present

## 2017-03-15 DIAGNOSIS — R109 Unspecified abdominal pain: Secondary | ICD-10-CM

## 2017-03-15 DIAGNOSIS — C8590 Non-Hodgkin lymphoma, unspecified, unspecified site: Secondary | ICD-10-CM | POA: Insufficient documentation

## 2017-03-15 DIAGNOSIS — G620 Drug-induced polyneuropathy: Secondary | ICD-10-CM

## 2017-03-15 DIAGNOSIS — C8331 Diffuse large B-cell lymphoma, lymph nodes of head, face, and neck: Secondary | ICD-10-CM

## 2017-03-15 DIAGNOSIS — L270 Generalized skin eruption due to drugs and medicaments taken internally: Secondary | ICD-10-CM

## 2017-03-15 LAB — GLUCOSE, CAPILLARY: Glucose-Capillary: 82 mg/dL (ref 65–99)

## 2017-03-15 MED ORDER — FLUDEOXYGLUCOSE F - 18 (FDG) INJECTION
9.8000 | Freq: Once | INTRAVENOUS | Status: AC | PRN
  Administered 2017-03-15: 9.8 via INTRAVENOUS

## 2017-03-15 NOTE — Telephone Encounter (Signed)
Appointment scheduled AVS/Calendar printed per 2/22 los

## 2017-03-15 NOTE — Telephone Encounter (Signed)
Spoke with pt's wife: Radiologist recommends biopsy of retroperitoneal node instead of scalp nodule. Take last dose of Xarelto on 2/23. HOLD on 2/24 in anticipation of procedure, per Dr. [name]. Radiology will call with appointment. Wife voiced understanding.

## 2017-03-15 NOTE — Progress Notes (Signed)
[facility] OFFICE PROGRESS NOTE   Diagnosis: Non-Hodgkin's lymphoma  INTERVAL HISTORY:   [name] returns as scheduled.  He has not noted a change in the scalp lesion since starting doxycycline.  The abdominal pain is partially improved.  No new complaint.  No fever or night sweats.  Good appetite.  Objective:  Vital signs in last 24 hours:  Blood pressure (!) 141/89, pulse 74, temperature 98.2 F (36.8 C), temperature source Oral, resp. rate 20, height _0  (1.727 m), weight 196 lb 1.6 oz (89 kg), SpO2 100 %.    HEENT: Approximate 2 cm raised lesion at the posterior parietal scalp Resp: Lungs clear bilaterally Cardio: Regular rate and rhythm GI: No hepatosplenomegaly, no mass, mild tenderness in the suprapubic area Vascular: No leg edema  Skin: Scattered areas of faint erythema at the parietal scalp, erythematous cutaneous nodule at the right axilla  Portacath/PICC-without erythema  Lab Results:  Lab Results  Component Value Date   WBC 3.9 (L) 03/08/2017   HGB 14.0 03/08/2017   HCT 41.9 03/08/2017   MCV 88.0 03/08/2017   PLT 192 03/08/2017   NEUTROABS 2.7 03/08/2017    CMP     Component Value Date/Time   NA 140 03/13/2017 1108   NA 140 01/24/2017 0839   K 4.1 03/13/2017 1108   K 4.4 01/24/2017 0839   CL 104 03/13/2017 1108   CO2 26 03/13/2017 1108   CO2 24 01/24/2017 0839   GLUCOSE 95 03/13/2017 1108   GLUCOSE 99 01/24/2017 0839   BUN 12 03/13/2017 1108   BUN 16.8 01/24/2017 0839   CREATININE 0.91 03/13/2017 1108   CREATININE 0.9 01/24/2017 0839   CALCIUM 9.5 03/13/2017 1108   CALCIUM 9.2 01/24/2017 0839   PROT 6.7 03/13/2017 1108   PROT 6.5 01/24/2017 0839   ALBUMIN 3.9 03/13/2017 1108   ALBUMIN 3.9 01/24/2017 0839   AST 27 03/13/2017 1108   AST 26 01/24/2017 0839   ALT 28 03/13/2017 1108   ALT 33 01/24/2017 0839   ALKPHOS 32 (L) 03/13/2017 1108   ALKPHOS 28 (L) 01/24/2017 0839   BILITOT 1.0 03/13/2017 1108   BILITOT 1.01  01/24/2017 0839   GFRNONAA >60 03/13/2017 1108   GFRAA >60 03/13/2017 1108     Medications: I have reviewed the patient's current medications.   Assessment/Plan: 1. 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  PET scan 03/20/2016 with extensive bulky hypermetabolic nodal activity in the neck, chest, abdomen and pelvis; mild splenomegaly with diffusely mildly accentuated splenic activity  Staging bone marrow biopsy 03/23/2016-negative for involvement with lymphoma  Cycle 1 EPOCH beginning 03/23/2016  Cycle 2 [facility] 04/13/2016  Restaging PET scan at [facility] 05/08/2016-lymph nodes with various degrees of FDG activity in the neck, axilla, right written him, mesentery, pelvis, and groin. Indeterminate liver lesions without FDG activity, nodular mass in the posterior medial aspect of the left thigh  Cycle 3 EPOCH04/27/2018  Cycle 1 CVP 06/21/2016  Cytoxan/prednisone plus brentuximab 07/17/2016  Cytoxan/prednisone plus brentuximab 08/07/2016 (dose reduced)  Initiation of every three-week brentuximab08/08/2016  brentuximabdose reduced 10/31/2016 secondary to neuropathy  Brentuximab further dose reduced 01/03/2017 secondary to neuropathy  Presentation with parietal scalp nodular lesion  03/08/2017  Staging PET scan [id number]/[id number] hypermetabolic lymphadenopathy in the neck, chest, abdomen/pelvis, and hypermetabolic cutaneous lesions 2. 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  He entered clinical remission following chemotherapy and remained in remission when he was seen at the cancer center 02/24/2009. 3. 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 Staging PET scan 08/23/2010 with increased FDG activity at the left tonsillar fossa and no additional evidence of lymphoma. He completed 4 cycles of R-ICE with cycle #1 beginning on 09/19/2010 and cycle #4 on 11/21/2010. Repeat head and neck examination by Dr. Constance Holster following R-ICE/rituximab showed no residual lymphoma. He began radiation consolidation on 12/18/2010, radiation was completed on 01/22/2011. 4. History of neutropenia secondary to rituximab. 5. Anemia secondary to chemotherapy, status post a red blood cell transfusion 11/30/2010. The hemoglobin has normalized. 6. History of mild thrombocytopenia secondary to chemotherapy. 7. Hypertension. 8. 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 05/08/2017-nodular mass in the left thigh adjacent to the femur  MRI 06/07/2016-infiltrative mass in the left thigh adductor muscle, separate from the schwannoma excision site  MRI 10/08/2016-marked improvement in the left adductormusclemass with a small area of enhancement remaining, no discrete mass 9. Port-A-Cath placement 03/21/2016 10. Superficial thrombus left greater saphenous vein 04/13/2016. Lovenox initiated, converted to Xarelto beginning 04/18/2016 11. 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  The parietal skin lesion is hypermetabolic.  The PET scan confirms extensive hypermetabolic lymphadenopathy and several cutaneous lesions.  I suspect the abdominal discomfort is related  to the abdomen/pelvic lymphadenopathy.  I reviewed the PET images with him.  I discussed the case with interventional radiology.  He will be referred for a CT-guided biopsy of a retroperitoneal lymph node for as soon as possible.  I discussed the case with Dr. Minna Antis.  He will see [name] next week.  He will be scheduled for a return office visit here in 1 week.  We will adjust the appointment time based on schedules for the biopsy and consult with Dr. Minna Antis.  30 minutes were spent with the patient today.  The majority of the time was used for counseling and coordination of care.  [name], MD  03/15/2017  5:36 PM

## 2017-03-18 ENCOUNTER — Ambulatory Visit: Payer: Self-pay | Admitting: Oncology

## 2017-03-18 ENCOUNTER — Telehealth: Payer: Self-pay

## 2017-03-18 DIAGNOSIS — C8590 Non-Hodgkin lymphoma, unspecified, unspecified site: Secondary | ICD-10-CM

## 2017-03-18 MED ORDER — LORAZEPAM 0.5 MG PO TABS: ORAL_TABLET | ORAL | 0 refills | Status: DC

## 2017-03-18 NOTE — Telephone Encounter (Addendum)
Received calln from pt regarding appt with MD at [facility]. States "I have appt scheduled for March 7th with Dr. Rosaland Lao (sp?) and Dr. Jacelyn Grip". Pt voiced concerns about appt being "so far out, but that's the earliest date they had available where I can see both Drs". Spoke with pt wife and informed that we are aware of message and this RN will inform Dr. [name]. Pt wife voiced understanding and also states "we need a refill on ativan". Will consult MD.   Per Dr. [name] pt to be seen in clinic on March 13th, pt voiced understanding. Also notified that Ativan refill was called in to pharmacy.

## 2017-03-19 ENCOUNTER — Telehealth: Payer: Self-pay | Admitting: Oncology

## 2017-03-19 NOTE — Telephone Encounter (Signed)
Scheduled appt per 2/25 sch message - left message with appt date and time.

## 2017-03-20 ENCOUNTER — Other Ambulatory Visit: Payer: Self-pay

## 2017-03-20 ENCOUNTER — Ambulatory Visit: Payer: Self-pay

## 2017-03-21 ENCOUNTER — Telehealth: Payer: Self-pay | Admitting: *Deleted

## 2017-03-21 ENCOUNTER — Other Ambulatory Visit: Payer: Self-pay | Admitting: Radiology

## 2017-03-21 ENCOUNTER — Ambulatory Visit: Payer: Self-pay | Admitting: Oncology

## 2017-03-21 ENCOUNTER — Other Ambulatory Visit: Payer: Self-pay | Admitting: Physician Assistant

## 2017-03-21 NOTE — Telephone Encounter (Signed)
Message from pt reporting constipation, no BM since last weekend despite Colace. Pt took a laxative with no relief. Reports "minimal watery stool". Reviewed with Dr. [name]: Add Miralax daily. Change to liquid diet for a couple days. Call office if no relief. Left message on voicemail with above instructions. 110: Called pt's wife, she reports he feels a little better but thinks some of the abdominal pain is related to lymphoma.

## 2017-03-22 ENCOUNTER — Telehealth: Payer: Self-pay | Admitting: *Deleted

## 2017-03-22 ENCOUNTER — Encounter (HOSPITAL_COMMUNITY): Payer: Self-pay

## 2017-03-22 ENCOUNTER — Ambulatory Visit: Payer: Self-pay | Admitting: Oncology

## 2017-03-22 ENCOUNTER — Ambulatory Visit (HOSPITAL_COMMUNITY)
Admission: RE | Admit: 2017-03-22 | Discharge: 2017-03-22 | Disposition: A | Discharge status: Home / Self Care | Payer: 59 | Source: Ambulatory Visit | Attending: Oncology | Admitting: Oncology

## 2017-03-22 DIAGNOSIS — I10 Essential (primary) hypertension: Secondary | ICD-10-CM | POA: Diagnosis not present

## 2017-03-22 DIAGNOSIS — Z9221 Personal history of antineoplastic chemotherapy: Secondary | ICD-10-CM | POA: Insufficient documentation

## 2017-03-22 DIAGNOSIS — Z880 Allergy status to penicillin: Secondary | ICD-10-CM | POA: Insufficient documentation

## 2017-03-22 DIAGNOSIS — Z79899 Other long term (current) drug therapy: Secondary | ICD-10-CM | POA: Insufficient documentation

## 2017-03-22 DIAGNOSIS — Z923 Personal history of irradiation: Secondary | ICD-10-CM | POA: Insufficient documentation

## 2017-03-22 DIAGNOSIS — L989 Disorder of the skin and subcutaneous tissue, unspecified: Secondary | ICD-10-CM | POA: Diagnosis not present

## 2017-03-22 DIAGNOSIS — Z7901 Long term (current) use of anticoagulants: Secondary | ICD-10-CM | POA: Insufficient documentation

## 2017-03-22 DIAGNOSIS — C8331 Diffuse large B-cell lymphoma, lymph nodes of head, face, and neck: Secondary | ICD-10-CM | POA: Diagnosis present

## 2017-03-22 DIAGNOSIS — C8338 Diffuse large B-cell lymphoma, lymph nodes of multiple sites: Secondary | ICD-10-CM | POA: Diagnosis not present

## 2017-03-22 DIAGNOSIS — Z87891 Personal history of nicotine dependence: Secondary | ICD-10-CM | POA: Diagnosis not present

## 2017-03-22 DIAGNOSIS — R109 Unspecified abdominal pain: Secondary | ICD-10-CM | POA: Insufficient documentation

## 2017-03-22 LAB — CBC
HEMATOCRIT: 39.3 % (ref 39.0–52.0)
Hemoglobin: 13.4 g/dL (ref 13.0–17.0)
MCH: 30 pg (ref 26.0–34.0)
MCHC: 34.1 g/dL (ref 30.0–36.0)
MCV: 87.9 fL (ref 78.0–100.0)
PLATELETS: 190 10*3/uL (ref 150–400)
RBC: 4.47 MIL/uL (ref 4.22–5.81)
RDW: 13.8 % (ref 11.5–15.5)
WBC: 4 10*3/uL (ref 4.0–10.5)

## 2017-03-22 LAB — PROTIME-INR
INR: 1.09
Prothrombin Time: 14 seconds (ref 11.4–15.2)

## 2017-03-22 MED ORDER — FENTANYL CITRATE (PF) 100 MCG/2ML IJ SOLN
INTRAMUSCULAR | Status: AC
  Filled 2017-03-22: qty 4

## 2017-03-22 MED ORDER — LIDOCAINE HCL (CARDIAC) 20 MG/ML IV SOLN
INTRAVENOUS | Status: AC
  Filled 2017-03-22: qty 5

## 2017-03-22 MED ORDER — SODIUM CHLORIDE 0.9 % IV SOLN: INTRAVENOUS | Status: DC

## 2017-03-22 MED ORDER — FENTANYL CITRATE (PF) 100 MCG/2ML IJ SOLN
INTRAMUSCULAR | Status: AC | PRN
  Administered 2017-03-22: 50 ug via INTRAVENOUS

## 2017-03-22 MED ORDER — MIDAZOLAM HCL 2 MG/2ML IJ SOLN
INTRAMUSCULAR | Status: AC | PRN
  Administered 2017-03-22: 1 mg via INTRAVENOUS
  Administered 2017-03-22: 0.5 mg via INTRAVENOUS

## 2017-03-22 MED ORDER — LIDOCAINE HCL 1 % IJ SOLN
INTRAMUSCULAR | Status: AC
  Filled 2017-03-22: qty 20

## 2017-03-22 MED ORDER — SODIUM CHLORIDE 0.9 % IV SOLN
INTRAVENOUS | Status: AC | PRN
  Administered 2017-03-22: 10 mL/h via INTRAVENOUS

## 2017-03-22 MED ORDER — MIDAZOLAM HCL 2 MG/2ML IJ SOLN
INTRAMUSCULAR | Status: AC
  Filled 2017-03-22: qty 4

## 2017-03-22 NOTE — Sedation Documentation (Signed)
Patient is resting comfortably. 

## 2017-03-22 NOTE — Procedures (Signed)
Lymphoma  S/p CT LEFT RP NODE BX  NO COMP STABLE EBL 0 PATH PENDING FULL REPORT IN PACS

## 2017-03-22 NOTE — H&P (Signed)
Chief Complaint: Patient was seen in consultation today for retroperitoneal lymph node biopsy at the request of Sherrill,Jawuan B  Referring Physician(s): Ladell Pier  Supervising Physician: Corrie Mckusick  Patient Status: [name] - Out-pt  History of Present Illness: [name] is a 57 y.o. male   Dx T cell Lymphoma 2018 Doing well until--- New lesion on scalp 1 mo ago and developed abd pain 3 weeks ago 2/22 PET: IMPRESSION: 1. PET-CT findings consistent with fairly extensive recurrent lymphoma involving the neck, chest, abdomen/pelvis and subcutaneous tissues/skin. 2. No definite bony involvement.  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 History of radiation therapy 12/18/10 to 01/22/11   left oropharynx  . Hypertension   . Neutropenia    Secondary to Rituxan  . Non Hodgkin's lymphoma ([place])    s/p 4 cycles R-ICE, start 11/21/10  . T-cell lymphoma ([place]) 03/14/2016    Past Surgical History:  Procedure Laterality Date  . BIOPSY PHARYNX     09/20/10 excision right posterior pharynx  . BONE MARROW ASPIRATION      09/08/10 Biopsy , and clot, left iliac crest  . IR GENERIC HISTORICAL  03/21/2016   IR FLUORO GUIDE PORT INSERTION RIGHT 03/21/2016 Arne Cleveland, MD [facility]-INTERV RAD  . IR GENERIC HISTORICAL  03/21/2016   IR US GUIDE VASC ACCESS RIGHT 03/21/2016 Arne Cleveland, MD [facility]-INTERV RAD  . PORTACATH PLACEMENT  09/19/10  . TONSILLECTOMY     Bilateral    Allergies: Penicillins  Medications: Prior to Admission medications   Medication Sig Start Date End Date Taking? Authorizing Provider  acetaminophen (TYLENOL) 500 MG tablet Take 1,000 mg by mouth every 6 (six) hours as needed for mild pain, moderate pain, fever or headache.   Yes [provider]  atenolol (TENORMIN) 50 MG tablet Take 50 mg by mouth daily.  08/19/14  Yes [provider]  lidocaine-prilocaine (EMLA) cream Apply 1 application topically as needed. Apply one hour prior to port access and cover with plastic wrap 03/29/16  Yes Ladell Pier, MD  ondansetron (ZOFRAN) 8 MG tablet Take 1 tablet (8 mg total) by mouth every 6 (six) hours as needed for nausea. 03/27/16  Yes Ladell Pier, MD  Probiotic Product (PROBIOTIC DAILY) CAPS Take 1 capsule by mouth daily.   Yes [provider]  prochlorperazine (COMPAZINE) 10 MG tablet Take 1 tablet (10 mg total) by mouth every 6 (six) hours as needed for nausea or vomiting. 03/29/16  Yes Ladell Pier, MD  zolpidem (AMBIEN) 5 MG tablet Take 1 tablet (5 mg total) by mouth at bedtime as needed for sleep. 01/03/17  Yes Ladell Pier, MD  doxycycline (VIBRA-TABS) 100 MG tablet Take 1 tablet (100 mg total) by mouth 2 (two) times daily. Patient not taking: Reported on 03/21/2017 03/13/17   Ladell Pier, MD  LORazepam (ATIVAN) 0.5 MG tablet May take 1-2 tablets,  0.5mg -1 mg at bedtime as needed for sleep Patient not taking: Reported on 03/21/2017 03/18/17   Ladell Pier, MD  rivaroxaban (XARELTO) 20 MG TABS tablet Take 1 tablet (20 mg total) by mouth daily with supper. 01/24/17   Ladell Pier, MD     History reviewed. No pertinent family history.  Social History   Socioeconomic History  . Marital status: Married    Spouse name: None  . Number of children: None  .  Years of education: None  . Highest education level: None  Social Needs  . Financial resource strain: None  . Food insecurity - worry: None  . Food insecurity - inability: None  . Transportation needs - medical: None  . Transportation needs - non-medical: None  Occupational History  . None  Tobacco Use  . Smoking status: Former Smoker    Packs/day: 0.50    Years: 5.00    Pack years: 2.50    Types: Cigarettes    Last attempt to quit: 11/30/1990    Years since quitting: 26.3  . Smokeless tobacco: Never Used  Substance and  Sexual Activity  . Alcohol use: No  . Drug use: No  . Sexual activity: Not Currently  Other Topics Concern  . None  Social History Narrative   Married, 2 children, Social research officer, government   Review of Systems: A 12 point ROS discussed and pertinent positives are indicated in the HPI above.  All other systems are negative.  Review of Systems  Constitutional: Negative for activity change, fatigue and fever.  Respiratory: Negative for cough and shortness of breath.   Cardiovascular: Negative for chest pain.  Gastrointestinal: Positive for abdominal pain.  Musculoskeletal: Negative for back pain.  Neurological: Negative for weakness.  Psychiatric/Behavioral: Negative for behavioral problems and confusion.    Vital Signs: BP (!) 144/92   Pulse 72   Temp 98.5 F (36.9 C) (Oral)   Resp 18   Ht 5\' 8"  (1.727 m)   Wt 185 lb (83.9 kg)   SpO2 98%   BMI 28.13 kg/m   Physical Exam  Constitutional: He is oriented to person, place, and time.  Cardiovascular: Normal rate, regular rhythm and normal heart sounds.  Pulmonary/Chest: Effort normal and breath sounds normal.  Abdominal: Soft. Bowel sounds are normal.  Musculoskeletal: Normal range of motion.  Neurological: He is alert and oriented to person, place, and time.  Skin: Skin is warm and dry.  Psychiatric: He has a normal mood and affect. His behavior is normal. Judgment and thought content normal.  Nursing note and vitals reviewed.   Imaging: Nm Pet Image Restage (ps) Whole Body  Result Date: 03/15/2017 CLINICAL DATA:  Subsequent treatment strategy for lymphoma. History of T-cell lymphoma with a new lesion at the posterior scalp and lower abdominal pain. EXAM: NUCLEAR MEDICINE PET WHOLE BODY TECHNIQUE: 9.8 mCi F-18 FDG was injected intravenously. Full-ring PET imaging was performed from the vertex to the feet after the radiotracer. CT data was obtained and used for attenuation correction and anatomic localization. FASTING BLOOD GLUCOSE:   Value: 82 mg/dl COMPARISON:  PET-CT 03/20/2016 FINDINGS: HEAD/NECK 2 cm posterior parietal scalp lesion near the vertex is hypermetabolic with SUV max of 9.7 and worrisome for cutaneous/subcutaneous lymphoma. Several small neck nodes are hypermetabolic and consistent with recurrent lymphoma. Index node on image number 69 measures 9 mm and SUV max is 11.1. Left-sided supraclavicular node on image number 83 measures 11 mm and SUV max is 14.7 CHEST Bilateral hypermetabolic axillary lymph nodes with SUV max of 9.2. There is a skin lesion near the right axilla which is hypermetabolic with SUV max of 58.5 16 mm paraesophageal lymph node on image number 108 has an SUV max of 17.7. Right retrocrural/para-aortic lymph node on image number 140 measures 13 mm and SUV max is 11.6. No pulmonary nodules. ABDOMEN/PELVIS Diffuse bulky recurrent retroperitoneal lymphadenopathy with marked hypermetabolism consistent with recurrent lymphoma. SUV max is 28.3. Nodal mass measures approximately 9.3 x 5.0 cm on image  number 160. 25 mm left common iliac lymph node on image number 132 is hypermetabolic with SUV max of 34. Bulky pelvic lymphadenopathy. Largest nodal lesion in the right operator area on image number 211 measures 27 mm and SUV max is 20.7 Small subcutaneous lesion in the right upper thigh laterally is hypermetabolic with SUV max of 4.5 SKELETON No focal hypermetabolic activity to suggest skeletal metastasis. EXTREMITIES No abnormal hypermetabolic activity in the lower extremities. IMPRESSION: 1. PET-CT findings consistent with fairly extensive recurrent lymphoma involving the neck, chest, abdomen/pelvis and subcutaneous tissues/skin. 2. No definite bony involvement. Electronically Signed   By: Marijo Sanes M.D.   On: 03/15/2017 15:45    Labs:  CBC: Recent Labs    01/03/17 0835 01/24/17 0839 02/08/17 1144 02/15/17 1355 03/08/17 1321  WBC 5.1 4.9 8.2 5.4 3.9*  HGB 14.7 14.1  --  13.9 14.0  HCT 43.3 42.0 43.0 41.3  41.9  PLT 211 190 205 264 192    COAGS: No results for input(s): INR, APTT in the last 8760 hours.  BMP: Recent Labs    05/18/16 1250  01/24/17 0839 02/15/17 1355 03/08/17 1321 03/13/17 1108  NA 141   < > 140 140 140 140  K 3.7   < > 4.4 3.9 4.0 4.1  CL 106  --   --  102 102 104  CO2 27   < > 24 28 27 26   GLUCOSE 86   < > 99 129 97 95  BUN 15   < > 16.8 16 15 12   CALCIUM 9.0   < > 9.2 9.5 9.7 9.5  CREATININE 0.80   < > 0.9 0.95 1.44* 0.91  GFRNONAA >60  --   --  >60 53* >60  GFRAA >60  --   --  >60 >60 >60   < > = values in this interval not displayed.    LIVER FUNCTION TESTS: Recent Labs    01/24/17 0839 02/15/17 1355 03/08/17 1321 03/13/17 1108  BILITOT 1.01 0.7 1.1 1.0  AST 26 27 28 27   ALT 33 25 26 28   ALKPHOS 28* 35* 36* 32*  PROT 6.5 6.8 6.8 6.7  ALBUMIN 3.9 3.9 4.1 3.9    TUMOR MARKERS: No results for input(s): AFPTM, CEA, CA199, CHROMGRNA in the last 8760 hours.  Assessment and Plan:  Remote Hx B cell Lymphoma Hx T cell Lymphoma New scalp lesion; new abd pain PET + for probable recurrent lymphoma neck, chest; abd/pelvis and subcu tissues. Scheduled now for Retroperitoneal LAN BX Risks and benefits discussed with the patient including, but not limited to bleeding, infection, damage to adjacent structures or low yield requiring additional tests.  All of the patient's questions were answered, patient is agreeable to proceed. Consent signed and in chart.  Thank you for this interesting consult.  I greatly enjoyed meeting TYJON BOWEN and look forward to participating in their care.  A copy of this report was sent to the requesting provider on this date.  Electronically Signed: Lavonia Drafts, PA-C 03/22/2017, 9:58 AM   I spent a total of  30 Minutes   in face to face in clinical consultation, greater than 50% of which was counseling/coordinating care for RP LAN BX

## 2017-03-22 NOTE — Discharge Instructions (Signed)
Needle Biopsy, Care After These instructions give you information about caring for yourself after your procedure. Your doctor may also give you more specific instructions. Call your doctor if you have any problems or questions after your procedure. Follow these instructions at home:  Rest as told by your doctor.  Take medicines only as told by your doctor.  There are many different ways to close and cover the biopsy site, including stitches (sutures), skin glue, and adhesive strips. Follow instructions from your doctor about: ? How to take care of your biopsy site. ? When and how you should change your bandage (dressing). ? When you should remove your dressing. ? Removing whatever was used to close your biopsy site.  Check your biopsy site every day for signs of infection. Watch for: ? Redness, swelling, or pain. ? Fluid, blood, or pus. Contact a doctor if:  You have a fever.  You have redness, swelling, or pain at the biopsy site, and it lasts longer than a few days.  You have fluid, blood, or pus coming from the biopsy site.  You feel sick to your stomach (nauseous).  You throw up (vomit). Get help right away if:  You are short of breath.  You have trouble breathing.  Your chest hurts.  You feel dizzy or you pass out (faint).  You have bleeding that does not stop with pressure or a bandage.  You cough up blood.  Your belly (abdomen) hurts. This information is not intended to replace advice given to you by your health care provider. Make sure you discuss any questions you have with your health care provider. Document Released: 12/22/2007 Document Revised: 06/16/2015 Document Reviewed: 01/04/2014 Elsevier Interactive Patient Education  2018 [employer]. Liver Biopsy, Care After These instructions give you information on caring for yourself after your procedure. Your doctor may also give you more specific instructions. Call your doctor if you have any problems or  questions after your procedure. Follow these instructions at home:  Rest at home for 1-2 days or as told by your doctor.  Have someone stay with you for at least 24 hours.  Do not do these things in the first 24 hours: ? Drive. ? Use machinery. ? Take care of other people. ? Sign legal documents. ? Take a bath or shower.  There are many different ways to close and cover a cut (incision). For example, a cut can be closed with stitches, skin glue, or adhesive strips. Follow your doctor's instructions on: ? Taking care of your cut. ? Changing and removing your bandage (dressing). ? Removing whatever was used to close your cut.  Do not drink alcohol in the first week.  Do not lift more than 5 pounds or play contact sports for the first 2 weeks.  Take medicines only as told by your doctor. For 1 week, do not take medicine that has aspirin in it or medicines like ibuprofen.  Get your test results. Contact a doctor if:  A cut bleeds and leaves more than just a small spot of blood.  A cut is red, puffs up (swells), or hurts more than before.  Fluid or something else comes from a cut.  A cut smells bad.  You have a fever or chills. Get help right away if:  You have swelling, bloating, or pain in your belly (abdomen).  You get dizzy or faint.  You have a rash.  You feel sick to your stomach (nauseous) or throw up (vomit).  You have  trouble breathing, feel short of breath, or feel faint.  Your chest hurts.  You have problems talking or seeing.  You have trouble balancing or moving your arms or legs. This information is not intended to replace advice given to you by your health care provider. Make sure you discuss any questions you have with your health care provider. Document Released: 10/18/2007 Document Revised: 06/16/2015 Document Reviewed: 03/06/2013 Elsevier Interactive Patient Education  [employer].

## 2017-03-22 NOTE — Telephone Encounter (Signed)
Called pt, he reports the biopsy went well. He was not given instructions on what to do with his Xarelto. HOLD until 3/3, per Dr. [name]. Pt voiced understanding. Pt stated he will discuss plan for stopping Xarelto with MD at next visit. He understood he was still taking it because he was on chemo.

## 2017-03-23 ENCOUNTER — Encounter: Payer: Self-pay | Admitting: Oncology

## 2017-03-25 ENCOUNTER — Encounter: Payer: Self-pay | Admitting: Nurse Practitioner

## 2017-03-25 ENCOUNTER — Telehealth: Payer: Self-pay | Admitting: Nurse Practitioner

## 2017-03-25 ENCOUNTER — Inpatient Hospital Stay: Payer: 59 | Attending: Oncology | Admitting: Nurse Practitioner

## 2017-03-25 VITALS — BP 122/80 | HR 65 | Temp 98.4°F | Resp 17 | Ht 68.0 in | Wt 195.8 lb

## 2017-03-25 DIAGNOSIS — G893 Neoplasm related pain (acute) (chronic): Secondary | ICD-10-CM | POA: Diagnosis not present

## 2017-03-25 DIAGNOSIS — C866 Primary cutaneous CD30-positive T-cell proliferations: Secondary | ICD-10-CM | POA: Diagnosis not present

## 2017-03-25 DIAGNOSIS — C8590 Non-Hodgkin lymphoma, unspecified, unspecified site: Secondary | ICD-10-CM

## 2017-03-25 DIAGNOSIS — M545 Low back pain: Secondary | ICD-10-CM | POA: Insufficient documentation

## 2017-03-25 DIAGNOSIS — Z5112 Encounter for antineoplastic immunotherapy: Secondary | ICD-10-CM | POA: Diagnosis present

## 2017-03-25 DIAGNOSIS — K5900 Constipation, unspecified: Secondary | ICD-10-CM | POA: Insufficient documentation

## 2017-03-25 DIAGNOSIS — G620 Drug-induced polyneuropathy: Secondary | ICD-10-CM | POA: Insufficient documentation

## 2017-03-25 DIAGNOSIS — R109 Unspecified abdominal pain: Secondary | ICD-10-CM | POA: Diagnosis not present

## 2017-03-25 DIAGNOSIS — R509 Fever, unspecified: Secondary | ICD-10-CM | POA: Diagnosis not present

## 2017-03-25 DIAGNOSIS — C8330 Diffuse large B-cell lymphoma, unspecified site: Secondary | ICD-10-CM

## 2017-03-25 DIAGNOSIS — Z5189 Encounter for other specified aftercare: Secondary | ICD-10-CM | POA: Diagnosis present

## 2017-03-25 MED ORDER — OXYCODONE-ACETAMINOPHEN 5-325 MG PO TABS
1.0000 | ORAL_TABLET | Freq: Four times a day (QID) | ORAL | 0 refills | Status: DC | PRN
Start: 2017-03-25 — End: 2017-03-29

## 2017-03-25 NOTE — Telephone Encounter (Signed)
No 3/4 los °

## 2017-03-25 NOTE — Progress Notes (Addendum)
[name] OFFICE PROGRESS NOTE   Diagnosis: Non-Hodgkin's lymphoma  INTERVAL HISTORY:   [name] returns prior to scheduled follow-up for evaluation of abdominal pain.  He reports worsening abdominal and back pain since his last visit.  He is having difficulty sleeping due to the pain.  He has tried Tylenol with no improvement.  In the past he has taken hydrocodone but did not feel this was effective for his pain.  Abdomen feels "full".  He is mainly on a liquid diet due to this.  He has had some constipation.  He thinks the scalp and right axillary masses are larger.  Objective:  Vital signs in last 24 hours:  Blood pressure 122/80, pulse 65, temperature 98.4 F (36.9 C), temperature source Oral, resp. rate 17, height _0  (1.727 m), weight 195 lb 12.8 oz (88.8 kg), SpO2 99 %.    HEENT: No thrush or ulcers. Lymphatics: No palpable cervical or supraclavicular lymph nodes. Resp: Lungs clear bilaterally. Cardio: Regular rate and rhythm. GI: Abdomen is mildly distended.  No hepatosplenomegaly.  No mass.  Bowel sounds active. Vascular: No leg edema. Neuro: Alert and oriented. Skin: Raised 2-3 cm lesion at the posterior parietal scalp; subcutaneous nodule right medial axilla. Port-A-Cath without erythema.  Lab Results:  Lab Results  Component Value Date   WBC 4.0 03/22/2017   HGB 13.4 03/22/2017   HCT 39.3 03/22/2017   MCV 87.9 03/22/2017   PLT 190 03/22/2017   NEUTROABS 2.7 03/08/2017    Imaging:  No results found.  Medications: I have reviewed the patient's current medications.  Assessment/Plan: 1. 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  PET scan 03/20/2016 with extensive bulky hypermetabolic nodal activity in the neck, chest, abdomen and pelvis; mild splenomegaly with diffusely mildly accentuated splenic activity  Staging bone marrow biopsy 03/23/2016-negative for involvement with lymphoma  Cycle 1 EPOCH beginning 03/23/2016  Cycle 2 [facility] 04/13/2016  Restaging PET scan at [facility] 05/08/2016-lymph nodes with various degrees of FDG activity in the neck, axilla, right written him, mesentery, pelvis, and groin. Indeterminate liver lesions without FDG activity, nodular mass in the posterior medial aspect of the left thigh  Cycle 3 EPOCH04/27/2018  Cycle 1 CVP 06/21/2016  Cytoxan/prednisone plus brentuximab 07/17/2016  Cytoxan/prednisone plus brentuximab 08/07/2016 (dose reduced)  Initiation of every three-week brentuximab08/08/2016  brentuximabdose reduced 10/31/2016 secondary to neuropathy  Brentuximab further dose reduced 01/03/2017 secondary to neuropathy  Presentation with parietal scalp nodular lesion 03/08/2017  Staging PET scan 5/36/6440-[id number] hypermetabolic lymphadenopathy in the neck, chest, abdomen/pelvis, and hypermetabolic cutaneous lesions  03/22/2017 CT biopsy left retroperitoneal nodal mass 2. 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 He entered clinical remission following chemotherapy and remained in remission when he was seen at the cancer center 02/24/2009. 3. 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 Staging PET scan 08/23/2010 with increased FDG activity at the left tonsillar fossa and no additional evidence of lymphoma. He completed 4 cycles of R-ICE with cycle #1 beginning on  09/19/2010  and cycle #4 on 11/21/2010. Repeat head and neck examination by Dr. Constance Holster following R-ICE/rituximab showed no residual lymphoma. He began radiation consolidation on 12/18/2010, radiation was completed on 01/22/2011. 4. History of neutropenia secondary to rituximab. 5. Anemia secondary to chemotherapy, status post a red blood cell transfusion 11/30/2010. The hemoglobin has normalized. 6. History of mild thrombocytopenia secondary to chemotherapy. 7. Hypertension. 8. 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 05/08/2017-nodular mass in the left thigh adjacent to the femur  MRI 06/07/2016-infiltrative mass in the left thigh adductor muscle, separate from the schwannoma excision site  MRI 10/08/2016-marked improvement in the left adductormusclemass with a small area of enhancement remaining, no discrete mass 9. Port-A-Cath placement 03/21/2016 10. Superficial thrombus left greater saphenous vein 04/13/2016. Lovenox initiated, converted to Xarelto beginning 04/18/2016 11. 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 has increased abdominal and back pain.  He understands this is likely due to abdominal/pelvic lymphadenopathy.  He was given a prescription for Percocet 1-2 tablets every 6 hours as needed.  He understands that he should not be driving while taking pain medication.  He has had recent constipation.  He will begin daily MiraLAX and continue a stool softener.  He underwent biopsy of left retroperitoneal adenopathy 03/22/2017.  The final pathology report is pending.  He is scheduled to be seen at [facility] on 03/28/2017.  Next visit here is scheduled 04/03/2017.  We will adjust accordingly pending the outcome of his appointment at [facility].  Patient seen with Dr. [name].   [name] ANP/GNP-BC   03/25/2017    11:06 AM This was a shared visit with [name].  Mr. Dubray was interviewed and examined.  I suspect the abdominal pain is related to progression of the lymphoma.  He will continue a bowel regimen and use Percocet as needed for pain.  We are waiting on results from the lymph node biopsy 03/28/2017.  Julieanne Manson, MD

## 2017-03-25 NOTE — Telephone Encounter (Signed)
Called pt, he reports pain has "stabilized" but it is difficult to sleep due to pain. Informed him Dr. [name] wants to see him to evaluate pain. Appt given for 1015.

## 2017-03-27 ENCOUNTER — Telehealth: Payer: Self-pay | Admitting: *Deleted

## 2017-03-27 NOTE — Telephone Encounter (Signed)
Notified pt's wife of biopsy result, per MD request. She requested a copy to take to [facility]. Result printed and left at front desk.

## 2017-03-28 ENCOUNTER — Other Ambulatory Visit: Payer: Self-pay

## 2017-03-28 ENCOUNTER — Ambulatory Visit: Payer: Self-pay | Admitting: Oncology

## 2017-03-28 ENCOUNTER — Ambulatory Visit: Payer: Self-pay

## 2017-03-29 ENCOUNTER — Other Ambulatory Visit: Payer: Self-pay | Admitting: *Deleted

## 2017-03-29 ENCOUNTER — Other Ambulatory Visit: Payer: Self-pay | Admitting: Medical

## 2017-03-29 DIAGNOSIS — C8590 Non-Hodgkin lymphoma, unspecified, unspecified site: Secondary | ICD-10-CM

## 2017-03-29 DIAGNOSIS — C8330 Diffuse large B-cell lymphoma, unspecified site: Secondary | ICD-10-CM

## 2017-03-29 MED ORDER — OXYCODONE-ACETAMINOPHEN 5-325 MG PO TABS: 1.0000 | ORAL_TABLET | Freq: Four times a day (QID) | ORAL | 0 refills | Status: DC | PRN

## 2017-03-29 NOTE — Telephone Encounter (Signed)
Call from pt's wife reporting he met with Dr. Mina Marble at [facility] on 3/7, they have referred him for clinical trial at [facility]. Pt is awaiting return call with consult appointment.  Pt will run out of Oxycodone over the weekend, wife requests a script to be sent to [facility]. Message to Sandi Mealy, PA for refill.

## 2017-04-03 ENCOUNTER — Inpatient Hospital Stay (HOSPITAL_BASED_OUTPATIENT_CLINIC_OR_DEPARTMENT_OTHER): Payer: 59 | Admitting: Oncology

## 2017-04-03 ENCOUNTER — Telehealth: Payer: Self-pay | Admitting: Oncology

## 2017-04-03 DIAGNOSIS — G893 Neoplasm related pain (acute) (chronic): Secondary | ICD-10-CM

## 2017-04-03 DIAGNOSIS — C8330 Diffuse large B-cell lymphoma, unspecified site: Secondary | ICD-10-CM

## 2017-04-03 DIAGNOSIS — G620 Drug-induced polyneuropathy: Secondary | ICD-10-CM

## 2017-04-03 DIAGNOSIS — C8590 Non-Hodgkin lymphoma, unspecified, unspecified site: Secondary | ICD-10-CM

## 2017-04-03 DIAGNOSIS — R109 Unspecified abdominal pain: Secondary | ICD-10-CM | POA: Diagnosis not present

## 2017-04-03 DIAGNOSIS — C866 Primary cutaneous CD30-positive T-cell proliferations: Secondary | ICD-10-CM

## 2017-04-03 DIAGNOSIS — R509 Fever, unspecified: Secondary | ICD-10-CM | POA: Diagnosis not present

## 2017-04-03 DIAGNOSIS — Z5189 Encounter for other specified aftercare: Secondary | ICD-10-CM | POA: Diagnosis not present

## 2017-04-03 MED ORDER — OXYCODONE-ACETAMINOPHEN 5-325 MG PO TABS: 1.0000 | ORAL_TABLET | ORAL | 0 refills | Status: DC | PRN

## 2017-04-03 MED ORDER — ZOLPIDEM TARTRATE 5 MG PO TABS: 5.0000 mg | ORAL_TABLET | Freq: Every evening | ORAL | 2 refills | Status: DC | PRN

## 2017-04-03 NOTE — Telephone Encounter (Signed)
Scheduled appt per 3/13 los - patient is aware of appt date and time. - my chart active.

## 2017-04-03 NOTE — Progress Notes (Signed)
[place] OFFICE PROGRESS NOTE   Diagnosis: Non-Hodgkin's lymphoma  INTERVAL HISTORY:   [name] returns for a scheduled visit.  He underwent a CT-guided biopsy of a retroperitoneal mass 03/22/2017.  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  The Ki-67 was elevated at 80%.  He was seen by the lymphoma and transplant services at [facility] last week.  He was referred to Dr. Dellis Filbert at [facility] and is scheduled to begin treatment on a clinical trial of bendamustine/CPI-613 on 04/08/2017.  Dr. Minna Antis plans to consider an allogeneic transplant if he develops a good response to the salvage systemic therapy.  He has increased abdominal pain.  He is now taking Percocet every 6 hours.  His bowels are moving.  He has low-grade fever and night sweats.  Objective:  Vital signs in last 24 hours:  Blood pressure 117/64, pulse 73, temperature 98.8 F (37.1 C), temperature source Oral, resp. rate 20, weight 193 lb 1.6 oz (87.6 kg), SpO2 99 %.     Resp: Lungs clear bilaterally Cardio: Regular rate and rhythm GI: No hepatosplenomegaly, no mass, nontender Vascular: No leg edema  Skin: 2 cm subcutaneous/cutaneous nodular lesion of the right axilla, raised erythematous lesion at the parietal scalp  Portacath/PICC-without erythema  Lab Results:  Lab Results  Component Value Date   WBC 4.0 03/22/2017   HGB 13.4 03/22/2017   HCT 39.3 03/22/2017   MCV 87.9 03/22/2017   PLT 190 03/22/2017   NEUTROABS 2.7 03/08/2017    CMP     Component Value Date/Time   NA 140 03/13/2017 1108   NA 140 01/24/2017 0839   K 4.1 03/13/2017 1108   K 4.4 01/24/2017 0839   CL 104 03/13/2017 1108   CO2 26 03/13/2017 1108   CO2 24 01/24/2017 0839   GLUCOSE 95 03/13/2017 1108   GLUCOSE 99 01/24/2017 0839   BUN 12 03/13/2017 1108   BUN 16.8 01/24/2017 0839   CREATININE 0.91 03/13/2017 1108   CREATININE 0.9 01/24/2017 0839   CALCIUM 9.5 03/13/2017 1108   CALCIUM 9.2  01/24/2017 0839   PROT 6.7 03/13/2017 1108   PROT 6.5 01/24/2017 0839   ALBUMIN 3.9 03/13/2017 1108   ALBUMIN 3.9 01/24/2017 0839   AST 27 03/13/2017 1108   AST 26 01/24/2017 0839   ALT 28 03/13/2017 1108   ALT 33 01/24/2017 0839   ALKPHOS 32 (L) 03/13/2017 1108   ALKPHOS 28 (L) 01/24/2017 0839   BILITOT 1.0 03/13/2017 1108   BILITOT 1.01 01/24/2017 0839   GFRNONAA >60 03/13/2017 1108   GFRAA >60 03/13/2017 1108     Medications: I have reviewed the patient's current medications.   Assessment/Plan: 1. 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  PET scan 03/20/2016 with extensive bulky hypermetabolic nodal activity in the neck, chest, abdomen and pelvis; mild splenomegaly with diffusely mildly accentuated splenic activity  Staging bone marrow biopsy 03/23/2016-negative for involvement with lymphoma  Cycle 1 EPOCH beginning 03/23/2016  Cycle 2 [facility] 04/13/2016  Restaging PET scan at [facility] 05/08/2016-lymph nodes with various degrees of FDG activity in the neck, axilla, right written him, mesentery, pelvis, and groin. Indeterminate liver lesions without FDG activity,  nodular mass in the posterior medial aspect of the left thigh  Cycle 3 EPOCH04/27/2018  Cycle 1 CVP 06/21/2016  Cytoxan/prednisone plus brentuximab 07/17/2016  Cytoxan/prednisone plus brentuximab 08/07/2016 (dose reduced)  Initiation of every three-week brentuximab08/08/2016  brentuximabdose reduced 10/31/2016 secondary to neuropathy  Brentuximab further dose reduced 01/03/2017 secondary to  neuropathy  Presentation with parietal scalp nodular lesion 03/08/2017  Staging PET scan 3/00/7622-[id number] hypermetabolic lymphadenopathy in the neck, chest, abdomen/pelvis, and hypermetabolic cutaneous lesions  03/22/2017 CT biopsy left retroperitoneal nodal mass-recurrent CD30 positive T-cell lymphoma 2. 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 He entered clinical remission following chemotherapy and remained in remission when he was seen at the cancer center 02/24/2009. 3. 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 Staging PET scan 08/23/2010 with increased FDG activity at the left tonsillar fossa and no additional evidence of lymphoma. He completed 4 cycles of R-ICE with cycle #1 beginning on 09/19/2010 and cycle #4 on 11/21/2010. Repeat head and neck examination by Dr. Constance Holster following R-ICE/rituximab showed no residual lymphoma. He began radiation consolidation on 12/18/2010, radiation was completed on 01/22/2011. 4. History of neutropenia secondary to rituximab. 5. Anemia secondary to chemotherapy, status post a red blood cell transfusion 11/30/2010. The hemoglobin has normalized. 6. History of mild thrombocytopenia secondary to chemotherapy. 7. Hypertension. 8. 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 05/08/2017-nodular mass in the left thigh adjacent to the femur  MRI 06/07/2016-infiltrative mass in the left thigh adductor muscle, separate from the schwannoma excision site  MRI 10/08/2016-marked improvement in the left adductormusclemass with a small area of enhancement remaining, no discrete mass 9. Port-A-Cath placement 03/21/2016 10. Superficial thrombus left greater saphenous  vein 04/13/2016. Lovenox initiated, converted to Xarelto beginning 04/18/2016 11. Peripheral neuropathy-likely secondary to toxicity from brentuximab, brentuximabdose reduced 10/10/2018and again 01/03/2017   Disposition: [name] has progressive T-cell lymphoma.  He was seen in consultation at [facility] and [facility] last week.  He plans to enroll on a clinical trial with bendamustine and an investigational agent (CPI-613).  He is scheduled to begin treatment on 04/08/2016.  I discussed the treatment plan with Mr. Frappier and his wife.  He is comfortable and rolling on the clinical trial as opposed to treatment with standard salvage systemic therapy agents.  He is also undergoing evaluation for allogeneic transplant.  He will continue Percocet as needed for pain.  He remains on a bowel regimen.  He will contact us as needed.  Mr. Tirado will be scheduled for an office visit here 05/01/2017.  We are available to see him sooner as needed.  25 minutes were spent with the patient today.  The majority of the time was used for counseling and coordination of care.  [name], MD  04/03/2017  2:15 PM

## 2017-04-05 ENCOUNTER — Encounter: Payer: Self-pay | Admitting: Oncology

## 2017-04-05 NOTE — Progress Notes (Signed)
Received PA request for Oxycodone 5-325mg .  Called [employer]([place]) and answered clinical question.  PA approved 04/05/17-01/21/38 and she ran a test claim and states it said refill too soon but he would be able to receive the full amount of 200 on 04/13/17.  [facility](Emily) to advise of approval and earliest pick up date. She will call the patient and let him know.

## 2017-04-11 ENCOUNTER — Telehealth: Payer: Self-pay | Admitting: *Deleted

## 2017-04-11 NOTE — Telephone Encounter (Signed)
Message from [place], [place] with Dr. Dellis Filbert. Pt will complete this cycle of chemo on 3/22. Requesting growth factor appt at [facility] on 3/25. OK, per Dr. [name]. Appointment scheduled.  Will notify [facility] during business hours on 3/22.

## 2017-04-12 NOTE — Telephone Encounter (Signed)
Called Katie at [facility]. She reports Ellen Henri has been authorized. #[id number]. Left message on voicemail informing pt of 3/25 appts.

## 2017-04-13 ENCOUNTER — Other Ambulatory Visit: Payer: Self-pay | Admitting: Oncology

## 2017-04-15 ENCOUNTER — Inpatient Hospital Stay: Payer: 59

## 2017-04-15 ENCOUNTER — Telehealth: Payer: Self-pay | Admitting: Oncology

## 2017-04-15 ENCOUNTER — Encounter: Payer: Self-pay | Admitting: *Deleted

## 2017-04-15 ENCOUNTER — Inpatient Hospital Stay (HOSPITAL_BASED_OUTPATIENT_CLINIC_OR_DEPARTMENT_OTHER): Payer: 59 | Admitting: Oncology

## 2017-04-15 VITALS — BP 121/71 | HR 68 | Temp 99.1°F | Resp 20 | Ht 68.0 in | Wt 195.0 lb

## 2017-04-15 DIAGNOSIS — G620 Drug-induced polyneuropathy: Secondary | ICD-10-CM

## 2017-04-15 DIAGNOSIS — C866 Primary cutaneous CD30-positive T-cell proliferations: Secondary | ICD-10-CM | POA: Diagnosis not present

## 2017-04-15 DIAGNOSIS — C8442 Peripheral T-cell lymphoma, not classified, intrathoracic lymph nodes: Secondary | ICD-10-CM

## 2017-04-15 DIAGNOSIS — Z5189 Encounter for other specified aftercare: Secondary | ICD-10-CM | POA: Diagnosis not present

## 2017-04-15 DIAGNOSIS — C8590 Non-Hodgkin lymphoma, unspecified, unspecified site: Secondary | ICD-10-CM

## 2017-04-15 DIAGNOSIS — Z95828 Presence of other vascular implants and grafts: Secondary | ICD-10-CM

## 2017-04-15 MED ORDER — PEGFILGRASTIM INJECTION 6 MG/0.6ML ~~LOC~~
PREFILLED_SYRINGE | SUBCUTANEOUS | Status: AC
  Filled 2017-04-15: qty 0.6

## 2017-04-15 MED ORDER — PEGFILGRASTIM INJECTION 6 MG/0.6ML ~~LOC~~: 6.0000 mg | PREFILLED_SYRINGE | Freq: Once | SUBCUTANEOUS | Status: DC

## 2017-04-15 MED ORDER — PEGFILGRASTIM-CBQV 6 MG/0.6ML ~~LOC~~ SOSY
6.0000 mg | PREFILLED_SYRINGE | Freq: Once | SUBCUTANEOUS | Status: AC
  Administered 2017-04-15: 6 mg via SUBCUTANEOUS

## 2017-04-15 MED ORDER — PEGFILGRASTIM-CBQV 6 MG/0.6ML ~~LOC~~ SOSY
PREFILLED_SYRINGE | SUBCUTANEOUS | Status: AC
  Filled 2017-04-15: qty 0.6

## 2017-04-15 NOTE — Telephone Encounter (Signed)
Scheduled appt per 3/25 los. Patient to get an updated scheduled next visit - also my chart active.

## 2017-04-15 NOTE — Progress Notes (Signed)
[place] OFFICE PROGRESS NOTE   Diagnosis: Non-Hodgkin's lymphoma  INTERVAL HISTORY:   [name] returns as scheduled.  He began treatment on a clinical trial at [facility] 04/08/2017 with bendamustine and CPI-613.  He developed diarrhea on the evening of 04/08/2017.  This progressed on 04/09/2017 and the study drug was held.  He reports receiving a study drug at a reduced dose on 04/11/2017.  He has noted a decrease in the scalp mass.  The diarrhea has resolved.  Abdominal pain has improved.  Objective:  Vital signs in last 24 hours:  Blood pressure 121/71, pulse 68, temperature 99.1 F (37.3 C), temperature source Oral, resp. rate 20, height '5\' 8"'$  (1.727 m), weight 195 lb (88.5 kg), SpO2 99 %.    HEENT: No thrush Resp: Lungs clear bilaterally Cardio: Regular rate and rhythm GI: No hepatosplenomegaly, tender in the left lower abdomen Vascular: No leg edema  Skin: Decreased parietal scalp and axillary masses  Portacath/PICC-without erythema  Lab Results:  Lab Results  Component Value Date   WBC 4.0 03/22/2017   HGB 13.4 03/22/2017   HCT 39.3 03/22/2017   MCV 87.9 03/22/2017   PLT 190 03/22/2017   NEUTROABS 2.7 03/08/2017    CMP     Component Value Date/Time   NA 140 03/13/2017 1108   NA 140 01/24/2017 0839   K 4.1 03/13/2017 1108   K 4.4 01/24/2017 0839   CL 104 03/13/2017 1108   CO2 26 03/13/2017 1108   CO2 24 01/24/2017 0839   GLUCOSE 95 03/13/2017 1108   GLUCOSE 99 01/24/2017 0839   BUN 12 03/13/2017 1108   BUN 16.8 01/24/2017 0839   CREATININE 0.91 03/13/2017 1108   CREATININE 0.9 01/24/2017 0839   CALCIUM 9.5 03/13/2017 1108   CALCIUM 9.2 01/24/2017 0839   PROT 6.7 03/13/2017 1108   PROT 6.5 01/24/2017 0839   ALBUMIN 3.9 03/13/2017 1108   ALBUMIN 3.9 01/24/2017 0839   AST 27 03/13/2017 1108   AST 26 01/24/2017 0839   ALT 28 03/13/2017 1108   ALT 33 01/24/2017 0839   ALKPHOS 32 (L) 03/13/2017 1108   ALKPHOS 28 (L) 01/24/2017 0839     BILITOT 1.0 03/13/2017 1108   BILITOT 1.01 01/24/2017 0839   GFRNONAA >60 03/13/2017 1108   GFRAA >60 03/13/2017 1108     Medications: I have reviewed the patient's current medications.   Assessment/Plan: 1. 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  PET scan 03/20/2016 with extensive bulky hypermetabolic nodal activity in the neck, chest, abdomen and pelvis; mild splenomegaly with diffusely mildly accentuated splenic activity  Staging bone marrow biopsy 03/23/2016-negative for involvement with lymphoma  Cycle 1 EPOCH beginning 03/23/2016  Cycle 2 [facility] 04/13/2016  Restaging PET scan at [facility] 05/08/2016-lymph nodes with various degrees of FDG activity in the neck, axilla, right written him, mesentery, pelvis, and groin. Indeterminate liver lesions without FDG activity, nodular mass in the posterior medial aspect of the left thigh  Cycle 3 EPOCH04/27/2018  Cycle 1 CVP 06/21/2016  Cytoxan/prednisone plus brentuximab 07/17/2016  Cytoxan/prednisone plus brentuximab 08/07/2016 (dose reduced)  Initiation of every three-week brentuximab08/08/2016  brentuximabdose reduced 10/31/2016 secondary to neuropathy  Brentuximab further dose  reduced 01/03/2017 secondary to neuropathy  Presentation with parietal scalp nodular lesion 03/08/2017  Staging PET scan 7/34/2876-[id number] hypermetabolic lymphadenopathy in the neck, chest, abdomen/pelvis, and hypermetabolic cutaneous lesions  03/22/2017 CT biopsy left retroperitoneal nodal mass-recurrent CD30 positive T-cell lymphoma  Cycle 1  bendamustine/CPI-613 on clinical trial at [facility] 04/08/2017 2. 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 He entered clinical remission following chemotherapy and remained in remission when he was seen at the cancer center 02/24/2009. 3. 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 Staging PET scan 08/23/2010 with increased FDG activity at the left tonsillar fossa and no additional evidence of lymphoma. He completed 4 cycles of R-ICE with cycle #1 beginning on 09/19/2010 and cycle #4 on 11/21/2010. Repeat head and neck examination by Dr. Constance Holster following R-ICE/rituximab showed no residual lymphoma. He began radiation consolidation on 12/18/2010, radiation was completed on 01/22/2011. 4. History of neutropenia secondary to rituximab. 5. Anemia secondary to chemotherapy, status post a red blood cell transfusion 11/30/2010. The hemoglobin has normalized. 6. History of mild thrombocytopenia secondary to chemotherapy. 7. Hypertension. 8. 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 05/08/2017-nodular mass in the left thigh adjacent to the femur  MRI 06/07/2016-infiltrative mass in the left thigh adductor muscle, separate from the schwannoma excision site  MRI 10/08/2016-marked improvement in the left adductormusclemass with a small area of enhancement remaining, no discrete mass 9. Port-A-Cath placement 03/21/2016 10. Superficial thrombus left greater saphenous vein 04/13/2016. Lovenox initiated, converted to Xarelto beginning 04/18/2016 11. Peripheral neuropathy-likely secondary to toxicity from brentuximab, brentuximabdose reduced 10/10/2018and again 01/03/2017   Disposition: Mr. Fortenberry appears  stable.  He is now at day 8 cycle 1 bendamustine/CPI-613 on study at [facility].  He will receive G-CSF today per the [facility] study.  The palpable cutaneous lesions at the scalp and right axilla are decreased in size.  Mr. Vanaman will return for an office visit and Neulasta after cycle 2.  We are available to see him in the interim as needed.  [name], MD  04/15/2017  8:22 AM

## 2017-04-15 NOTE — Patient Instructions (Signed)
Pegfilgrastim injection What is this medicine? PEGFILGRASTIM (PEG fil gra stim) is a long-acting granulocyte colony-stimulating factor that stimulates the growth of neutrophils, a type of white blood cell important in the body's fight against infection. It is used to reduce the incidence of fever and infection in patients with certain types of cancer who are receiving chemotherapy that affects the bone marrow, and to increase survival after being exposed to high doses of radiation. This medicine may be used for other purposes; ask your health care provider or pharmacist if you have questions. COMMON BRAND NAME(S): Neulasta What should I tell my health care provider before I take this medicine? They need to know if you have any of these conditions: -kidney disease -latex allergy -ongoing radiation therapy -sickle cell disease -skin reactions to acrylic adhesives (On-Body Injector only) -an unusual or allergic reaction to pegfilgrastim, filgrastim, other medicines, foods, dyes, or preservatives -pregnant or trying to get pregnant -breast-feeding How should I use this medicine? This medicine is for injection under the skin. If you get this medicine at home, you will be taught how to prepare and give the pre-filled syringe or how to use the On-body Injector. Refer to the patient Instructions for Use for detailed instructions. Use exactly as directed. Tell your healthcare provider immediately if you suspect that the On-body Injector may not have performed as intended or if you suspect the use of the On-body Injector resulted in a missed or partial dose. It is important that you put your used needles and syringes in a special sharps container. Do not put them in a trash can. If you do not have a sharps container, call your pharmacist or healthcare provider to get one. Talk to your pediatrician regarding the use of this medicine in children. While this drug may be prescribed for selected conditions,  precautions do apply. Overdosage: If you think you have taken too much of this medicine contact a poison control center or emergency room at once. NOTE: This medicine is only for you. Do not share this medicine with others. What if I miss a dose? It is important not to miss your dose. Call your doctor or health care professional if you miss your dose. If you miss a dose due to an On-body Injector failure or leakage, a new dose should be administered as soon as possible using a single prefilled syringe for manual use. What may interact with this medicine? Interactions have not been studied. Give your health care provider a list of all the medicines, herbs, non-prescription drugs, or dietary supplements you use. Also tell them if you smoke, drink alcohol, or use illegal drugs. Some items may interact with your medicine. This list may not describe all possible interactions. Give your health care provider a list of all the medicines, herbs, non-prescription drugs, or dietary supplements you use. Also tell them if you smoke, drink alcohol, or use illegal drugs. Some items may interact with your medicine. What should I watch for while using this medicine? You may need blood work done while you are taking this medicine. If you are going to need a MRI, CT scan, or other procedure, tell your doctor that you are using this medicine (On-Body Injector only). What side effects may I notice from receiving this medicine? Side effects that you should report to your doctor or health care professional as soon as possible: -allergic reactions like skin rash, itching or hives, swelling of the face, lips, or tongue -dizziness -fever -pain, redness, or irritation at site   where injected -pinpoint red spots on the skin -red or dark-brown urine -shortness of breath or breathing problems -stomach or side pain, or pain at the shoulder -swelling -tiredness -trouble passing urine or change in the amount of urine Side  effects that usually do not require medical attention (report to your doctor or health care professional if they continue or are bothersome): -bone pain -muscle pain This list may not describe all possible side effects. Call your doctor for medical advice about side effects. You may report side effects to FDA at 1-800-FDA-1088. Where should I keep my medicine? Keep out of the reach of children. Store pre-filled syringes in a refrigerator between 2 and 8 degrees C (36 and 46 degrees F). Do not freeze. Keep in carton to protect from light. Throw away this medicine if it is left out of the refrigerator for more than 48 hours. Throw away any unused medicine after the expiration date. NOTE: This sheet is a summary. It may not cover all possible information. If you have questions about this medicine, talk to your doctor, pharmacist, or health care provider.  2018 Elsevier/Gold Standard (2016-01-05 12:58:03)  

## 2017-05-01 ENCOUNTER — Ambulatory Visit: Payer: Self-pay | Admitting: Nurse Practitioner

## 2017-05-01 ENCOUNTER — Telehealth: Payer: Self-pay

## 2017-05-01 NOTE — Telephone Encounter (Signed)
Pt called to report that he can not make it for his appt today. Per Lattie Haw, NP pt does not need to reschedule, can follow up as scheduled. Pt ha appt for neulasta inj scheduled with next visit.

## 2017-05-07 ENCOUNTER — Telehealth: Payer: Self-pay | Admitting: Oncology

## 2017-05-07 NOTE — Telephone Encounter (Signed)
appt already scheduled per 4/16 sch message.

## 2017-05-10 ENCOUNTER — Telehealth: Payer: Self-pay | Admitting: Oncology

## 2017-05-10 ENCOUNTER — Other Ambulatory Visit: Payer: Self-pay | Admitting: Oncology

## 2017-05-10 DIAGNOSIS — C8590 Non-Hodgkin lymphoma, unspecified, unspecified site: Secondary | ICD-10-CM

## 2017-05-10 NOTE — Telephone Encounter (Signed)
Patient called to verify his appointment time

## 2017-05-13 ENCOUNTER — Inpatient Hospital Stay (HOSPITAL_BASED_OUTPATIENT_CLINIC_OR_DEPARTMENT_OTHER): Payer: 59 | Admitting: Oncology

## 2017-05-13 ENCOUNTER — Inpatient Hospital Stay: Payer: 59 | Attending: Oncology

## 2017-05-13 VITALS — BP 131/72 | HR 67 | Temp 98.5°F | Resp 18 | Ht 68.0 in | Wt 187.7 lb

## 2017-05-13 DIAGNOSIS — Z923 Personal history of irradiation: Secondary | ICD-10-CM

## 2017-05-13 DIAGNOSIS — C8442 Peripheral T-cell lymphoma, not classified, intrathoracic lymph nodes: Secondary | ICD-10-CM

## 2017-05-13 DIAGNOSIS — G620 Drug-induced polyneuropathy: Secondary | ICD-10-CM | POA: Insufficient documentation

## 2017-05-13 DIAGNOSIS — C866 Primary cutaneous CD30-positive T-cell proliferations: Secondary | ICD-10-CM | POA: Insufficient documentation

## 2017-05-13 DIAGNOSIS — C8590 Non-Hodgkin lymphoma, unspecified, unspecified site: Secondary | ICD-10-CM

## 2017-05-13 DIAGNOSIS — R10814 Left lower quadrant abdominal tenderness: Secondary | ICD-10-CM | POA: Insufficient documentation

## 2017-05-13 DIAGNOSIS — Z5189 Encounter for other specified aftercare: Secondary | ICD-10-CM | POA: Diagnosis present

## 2017-05-13 DIAGNOSIS — Z5112 Encounter for antineoplastic immunotherapy: Secondary | ICD-10-CM | POA: Diagnosis present

## 2017-05-13 MED ORDER — PEGFILGRASTIM INJECTION 6 MG/0.6ML ~~LOC~~: 6.0000 mg | PREFILLED_SYRINGE | Freq: Once | SUBCUTANEOUS | Status: DC

## 2017-05-13 MED ORDER — PEGFILGRASTIM-CBQV 6 MG/0.6ML ~~LOC~~ SOSY
6.0000 mg | PREFILLED_SYRINGE | Freq: Once | SUBCUTANEOUS | Status: AC
  Administered 2017-05-13: 6 mg via SUBCUTANEOUS

## 2017-05-13 MED ORDER — PEGFILGRASTIM INJECTION 6 MG/0.6ML ~~LOC~~
PREFILLED_SYRINGE | SUBCUTANEOUS | Status: AC
  Filled 2017-05-13: qty 0.6

## 2017-05-13 MED ORDER — PEGFILGRASTIM-CBQV 6 MG/0.6ML ~~LOC~~ SOSY
PREFILLED_SYRINGE | SUBCUTANEOUS | Status: AC
  Filled 2017-05-13: qty 0.6

## 2017-05-13 NOTE — Progress Notes (Signed)
[place] OFFICE PROGRESS NOTE   Diagnosis: T-cell lymphoma  INTERVAL HISTORY:   Mr. Kirk returns as scheduled.  He completed cycle 2 bendamustine/CPI-613 on 05/10/2017.  He reports less diarrhea following this cycle.  The scalp and axillary masses have improved.  He no longer has abdominal pain.  He has left lower abdominal tenderness.  Good appetite.  Objective:  Vital signs in last 24 hours:  Blood pressure 131/72, pulse 67, temperature 98.5 F (36.9 C), temperature source Oral, resp. rate 18, height _0  (1.727 m), weight 187 lb 11.2 oz (85.1 kg), SpO2 99 %.    HEENT: No thrush or ulcers Resp: Lungs clear bilaterally Cardio: Regular rate and rhythm GI: No hepatosplenomegaly, mild fullness in the left lower quadrant with associated tenderness Vascular: No leg edema  Skin: Slightly raised purplish area at the parietal scalp, tiny cutaneous nodular lesion at the upper right axillary line  Portacath/PICC-without erythema  Lab Results:  Lab Results  Component Value Date   WBC 4.0 03/22/2017   HGB 13.4 03/22/2017   HCT 39.3 03/22/2017   MCV 87.9 03/22/2017   PLT 190 03/22/2017   NEUTROABS 2.7 03/08/2017    CMP     Component Value Date/Time   NA 140 03/13/2017 1108   NA 140 01/24/2017 0839   K 4.1 03/13/2017 1108   K 4.4 01/24/2017 0839   CL 104 03/13/2017 1108   CO2 26 03/13/2017 1108   CO2 24 01/24/2017 0839   GLUCOSE 95 03/13/2017 1108   GLUCOSE 99 01/24/2017 0839   BUN 12 03/13/2017 1108   BUN 16.8 01/24/2017 0839   CREATININE 0.91 03/13/2017 1108   CREATININE 0.9 01/24/2017 0839   CALCIUM 9.5 03/13/2017 1108   CALCIUM 9.2 01/24/2017 0839   PROT 6.7 03/13/2017 1108   PROT 6.5 01/24/2017 0839   ALBUMIN 3.9 03/13/2017 1108   ALBUMIN 3.9 01/24/2017 0839   AST 27 03/13/2017 1108   AST 26 01/24/2017 0839   ALT 28 03/13/2017 1108   ALT 33 01/24/2017 0839   ALKPHOS 32 (L) 03/13/2017 1108   ALKPHOS 28 (L) 01/24/2017 0839   BILITOT 1.0  03/13/2017 1108   BILITOT 1.01 01/24/2017 0839   GFRNONAA >60 03/13/2017 1108   GFRAA >60 03/13/2017 1108     Medications: I have reviewed the patient's current medications.   Assessment/Plan: 1. 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  PET scan 03/20/2016 with extensive bulky hypermetabolic nodal activity in the neck, chest, abdomen and pelvis; mild splenomegaly with diffusely mildly accentuated splenic activity  Staging bone marrow biopsy 03/23/2016-negative for involvement with lymphoma  Cycle 1 EPOCH beginning 03/23/2016  Cycle 2 [facility] 04/13/2016  Restaging PET scan at [facility] 05/08/2016-lymph nodes with various degrees of FDG activity in the neck, axilla, right written him, mesentery, pelvis, and groin. Indeterminate liver lesions without FDG activity, nodular mass in the posterior medial aspect of the left thigh  Cycle 3 EPOCH04/27/2018  Cycle 1 CVP 06/21/2016  Cytoxan/prednisone plus brentuximab 07/17/2016  Cytoxan/prednisone plus brentuximab 08/07/2016 (dose reduced)  Initiation of every three-week brentuximab08/08/2016  brentuximabdose reduced 10/31/2016 secondary to neuropathy  Brentuximab further dose reduced 01/03/2017 secondary to neuropathy  Presentation with parietal  scalp nodular lesion 03/08/2017  Staging PET scan 3/38/2505-[id number] hypermetabolic lymphadenopathy in the neck, chest, abdomen/pelvis, and hypermetabolic cutaneous lesions  03/22/2017 CT biopsy left retroperitoneal nodal mass-recurrent CD30 positive T-cell lymphoma  Cycle 1 bendamustine/CPI-613  on clinical trial at [facility] 04/08/2017  Cycle 2 bendamustine/CPI-613 05/06/2017 2. 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 He entered clinical remission following chemotherapy and remained in remission when he was seen at the cancer center 02/24/2009. 3. 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 Staging PET scan 08/23/2010 with increased FDG activity at the left tonsillar fossa and no additional evidence of lymphoma. He completed 4 cycles of R-ICE with cycle #1 beginning on 09/19/2010 and cycle #4 on 11/21/2010. Repeat head and neck examination by Dr. Constance Holster following R-ICE/rituximab showed no residual lymphoma. He began radiation consolidation on 12/18/2010, radiation was completed on 01/22/2011. 4. History of neutropenia secondary to rituximab. 5. Anemia secondary to chemotherapy, status post a red blood cell transfusion 11/30/2010. The hemoglobin has normalized. 6. History of mild thrombocytopenia secondary to chemotherapy. 7. Hypertension. 8. 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 05/08/2017-nodular mass in the left thigh adjacent to the femur  MRI 06/07/2016-infiltrative mass in the left thigh adductor muscle, separate from the schwannoma excision site  MRI 10/08/2016-marked improvement in the left adductormusclemass with a small area of enhancement remaining, no discrete mass 9. Port-A-Cath placement 03/21/2016 10. Superficial thrombus left greater saphenous vein 04/13/2016. Lovenox initiated, converted to Xarelto beginning 04/18/2016 11. Peripheral neuropathy-likely secondary to toxicity from brentuximab, brentuximabdose reduced 10/10/2018and again  01/03/2017   Disposition: Mr. Nettleton appears well.  He has completed a second cycle of chemotherapy on study.  There is clinical evidence of a response to therapy.  He will receive Neulasta today.  He is scheduled for a restaging PET scan at [facility] on 05/22/2017.  He is scheduled for cycle 3 chemotherapy on 06/03/2017.  He will return for an office visit here with Neulasta on 06/11/2017.  15 minutes were spent with the patient today.  The majority of the time was used for counseling and coordination of care.  [name], MD  05/13/2017  9:00 AM

## 2017-05-13 NOTE — Patient Instructions (Signed)
Pegfilgrastim injection What is this medicine? PEGFILGRASTIM (PEG fil gra stim) is a long-acting granulocyte colony-stimulating factor that stimulates the growth of neutrophils, a type of white blood cell important in the body's fight against infection. It is used to reduce the incidence of fever and infection in patients with certain types of cancer who are receiving chemotherapy that affects the bone marrow, and to increase survival after being exposed to high doses of radiation. This medicine may be used for other purposes; ask your health care provider or pharmacist if you have questions. COMMON BRAND NAME(S): Neulasta What should I tell my health care provider before I take this medicine? They need to know if you have any of these conditions: -kidney disease -latex allergy -ongoing radiation therapy -sickle cell disease -skin reactions to acrylic adhesives (On-Body Injector only) -an unusual or allergic reaction to pegfilgrastim, filgrastim, other medicines, foods, dyes, or preservatives -pregnant or trying to get pregnant -breast-feeding How should I use this medicine? This medicine is for injection under the skin. If you get this medicine at home, you will be taught how to prepare and give the pre-filled syringe or how to use the On-body Injector. Refer to the patient Instructions for Use for detailed instructions. Use exactly as directed. Tell your healthcare provider immediately if you suspect that the On-body Injector may not have performed as intended or if you suspect the use of the On-body Injector resulted in a missed or partial dose. It is important that you put your used needles and syringes in a special sharps container. Do not put them in a trash can. If you do not have a sharps container, call your pharmacist or healthcare provider to get one. Talk to your pediatrician regarding the use of this medicine in children. While this drug may be prescribed for selected conditions,  precautions do apply. Overdosage: If you think you have taken too much of this medicine contact a poison control center or emergency room at once. NOTE: This medicine is only for you. Do not share this medicine with others. What if I miss a dose? It is important not to miss your dose. Call your doctor or health care professional if you miss your dose. If you miss a dose due to an On-body Injector failure or leakage, a new dose should be administered as soon as possible using a single prefilled syringe for manual use. What may interact with this medicine? Interactions have not been studied. Give your health care provider a list of all the medicines, herbs, non-prescription drugs, or dietary supplements you use. Also tell them if you smoke, drink alcohol, or use illegal drugs. Some items may interact with your medicine. This list may not describe all possible interactions. Give your health care provider a list of all the medicines, herbs, non-prescription drugs, or dietary supplements you use. Also tell them if you smoke, drink alcohol, or use illegal drugs. Some items may interact with your medicine. What should I watch for while using this medicine? You may need blood work done while you are taking this medicine. If you are going to need a MRI, CT scan, or other procedure, tell your doctor that you are using this medicine (On-Body Injector only). What side effects may I notice from receiving this medicine? Side effects that you should report to your doctor or health care professional as soon as possible: -allergic reactions like skin rash, itching or hives, swelling of the face, lips, or tongue -dizziness -fever -pain, redness, or irritation at site   where injected -pinpoint red spots on the skin -red or dark-brown urine -shortness of breath or breathing problems -stomach or side pain, or pain at the shoulder -swelling -tiredness -trouble passing urine or change in the amount of urine Side  effects that usually do not require medical attention (report to your doctor or health care professional if they continue or are bothersome): -bone pain -muscle pain This list may not describe all possible side effects. Call your doctor for medical advice about side effects. You may report side effects to FDA at 1-800-FDA-1088. Where should I keep my medicine? Keep out of the reach of children. Store pre-filled syringes in a refrigerator between 2 and 8 degrees C (36 and 46 degrees F). Do not freeze. Keep in carton to protect from light. Throw away this medicine if it is left out of the refrigerator for more than 48 hours. Throw away any unused medicine after the expiration date. NOTE: This sheet is a summary. It may not cover all possible information. If you have questions about this medicine, talk to your doctor, pharmacist, or health care provider.  2018 Elsevier/Gold Standard (2016-01-05 12:58:03)  

## 2017-05-22 ENCOUNTER — Telehealth: Payer: Self-pay | Admitting: *Deleted

## 2017-05-22 NOTE — Telephone Encounter (Signed)
Call from pt reporting his temperatures have run from 99-99.7 since 4/28. Improves with Tylenol. He reports he feels OK despite mild fatigue. Reviewed with [name]: instructed pt to call Dr. Assunta Curtis office, since he is currently on study. He agreed to do so.

## 2017-05-30 ENCOUNTER — Other Ambulatory Visit: Payer: Self-pay | Admitting: Nurse Practitioner

## 2017-05-30 DIAGNOSIS — C8590 Non-Hodgkin lymphoma, unspecified, unspecified site: Secondary | ICD-10-CM

## 2017-05-31 ENCOUNTER — Other Ambulatory Visit: Payer: Self-pay | Admitting: Oncology

## 2017-06-10 ENCOUNTER — Other Ambulatory Visit: Payer: Self-pay | Admitting: Nurse Practitioner

## 2017-06-10 ENCOUNTER — Inpatient Hospital Stay: Payer: 59 | Attending: Oncology

## 2017-06-10 DIAGNOSIS — Z5189 Encounter for other specified aftercare: Secondary | ICD-10-CM | POA: Insufficient documentation

## 2017-06-10 DIAGNOSIS — Z5112 Encounter for antineoplastic immunotherapy: Secondary | ICD-10-CM | POA: Diagnosis present

## 2017-06-10 DIAGNOSIS — C866 Primary cutaneous CD30-positive T-cell proliferations: Secondary | ICD-10-CM | POA: Diagnosis present

## 2017-06-10 DIAGNOSIS — C8590 Non-Hodgkin lymphoma, unspecified, unspecified site: Secondary | ICD-10-CM

## 2017-06-10 MED ORDER — PEGFILGRASTIM-CBQV 6 MG/0.6ML ~~LOC~~ SOSY: 6.0000 mg | PREFILLED_SYRINGE | Freq: Once | SUBCUTANEOUS | Status: DC

## 2017-06-10 MED ORDER — PEGFILGRASTIM-CBQV 6 MG/0.6ML ~~LOC~~ SOSY
PREFILLED_SYRINGE | SUBCUTANEOUS | Status: AC
Start: 2017-06-10 — End: 2017-06-10
  Filled 2017-06-10: qty 0.6

## 2017-06-10 MED ORDER — PEGFILGRASTIM-CBQV 6 MG/0.6ML ~~LOC~~ SOSY
6.0000 mg | PREFILLED_SYRINGE | Freq: Once | SUBCUTANEOUS | Status: AC
  Administered 2017-06-10: 6 mg via SUBCUTANEOUS

## 2017-06-10 NOTE — Addendum Note (Signed)
Addended by: Tora Kindred on: 06/10/2017 09:05 AM   Modules accepted: Orders

## 2017-06-10 NOTE — Patient Instructions (Signed)
Pegfilgrastim injection What is this medicine? PEGFILGRASTIM (PEG fil gra stim) is a long-acting granulocyte colony-stimulating factor that stimulates the growth of neutrophils, a type of white blood cell important in the body's fight against infection. It is used to reduce the incidence of fever and infection in patients with certain types of cancer who are receiving chemotherapy that affects the bone marrow, and to increase survival after being exposed to high doses of radiation. This medicine may be used for other purposes; ask your health care provider or pharmacist if you have questions. COMMON BRAND NAME(S): Neulasta What should I tell my health care provider before I take this medicine? They need to know if you have any of these conditions: -kidney disease -latex allergy -ongoing radiation therapy -sickle cell disease -skin reactions to acrylic adhesives (On-Body Injector only) -an unusual or allergic reaction to pegfilgrastim, filgrastim, other medicines, foods, dyes, or preservatives -pregnant or trying to get pregnant -breast-feeding How should I use this medicine? This medicine is for injection under the skin. If you get this medicine at home, you will be taught how to prepare and give the pre-filled syringe or how to use the On-body Injector. Refer to the patient Instructions for Use for detailed instructions. Use exactly as directed. Tell your healthcare provider immediately if you suspect that the On-body Injector may not have performed as intended or if you suspect the use of the On-body Injector resulted in a missed or partial dose. It is important that you put your used needles and syringes in a special sharps container. Do not put them in a trash can. If you do not have a sharps container, call your pharmacist or healthcare provider to get one. Talk to your pediatrician regarding the use of this medicine in children. While this drug may be prescribed for selected conditions,  precautions do apply. Overdosage: If you think you have taken too much of this medicine contact a poison control center or emergency room at once. NOTE: This medicine is only for you. Do not share this medicine with others. What if I miss a dose? It is important not to miss your dose. Call your doctor or health care professional if you miss your dose. If you miss a dose due to an On-body Injector failure or leakage, a new dose should be administered as soon as possible using a single prefilled syringe for manual use. What may interact with this medicine? Interactions have not been studied. Give your health care provider a list of all the medicines, herbs, non-prescription drugs, or dietary supplements you use. Also tell them if you smoke, drink alcohol, or use illegal drugs. Some items may interact with your medicine. This list may not describe all possible interactions. Give your health care provider a list of all the medicines, herbs, non-prescription drugs, or dietary supplements you use. Also tell them if you smoke, drink alcohol, or use illegal drugs. Some items may interact with your medicine. What should I watch for while using this medicine? You may need blood work done while you are taking this medicine. If you are going to need a MRI, CT scan, or other procedure, tell your doctor that you are using this medicine (On-Body Injector only). What side effects may I notice from receiving this medicine? Side effects that you should report to your doctor or health care professional as soon as possible: -allergic reactions like skin rash, itching or hives, swelling of the face, lips, or tongue -dizziness -fever -pain, redness, or irritation at site   where injected -pinpoint red spots on the skin -red or dark-brown urine -shortness of breath or breathing problems -stomach or side pain, or pain at the shoulder -swelling -tiredness -trouble passing urine or change in the amount of urine Side  effects that usually do not require medical attention (report to your doctor or health care professional if they continue or are bothersome): -bone pain -muscle pain This list may not describe all possible side effects. Call your doctor for medical advice about side effects. You may report side effects to FDA at 1-800-FDA-1088. Where should I keep my medicine? Keep out of the reach of children. Store pre-filled syringes in a refrigerator between 2 and 8 degrees C (36 and 46 degrees F). Do not freeze. Keep in carton to protect from light. Throw away this medicine if it is left out of the refrigerator for more than 48 hours. Throw away any unused medicine after the expiration date. NOTE: This sheet is a summary. It may not cover all possible information. If you have questions about this medicine, talk to your doctor, pharmacist, or health care provider.  2018 Elsevier/Gold Standard (2016-01-05 12:58:03)  

## 2017-06-11 ENCOUNTER — Inpatient Hospital Stay: Payer: 59 | Admitting: Oncology

## 2017-06-11 ENCOUNTER — Inpatient Hospital Stay: Payer: 59

## 2017-06-11 ENCOUNTER — Ambulatory Visit: Payer: Self-pay

## 2017-07-11 ENCOUNTER — Telehealth: Payer: Self-pay

## 2017-07-11 ENCOUNTER — Telehealth: Payer: Self-pay | Admitting: Oncology

## 2017-07-11 NOTE — Telephone Encounter (Signed)
Returned call to wife regarding appts. Wife states that pt needs neulasta injection mon/tues next week. Per Dr. [name] he wants to see the pt first. Schedule message sent for visit and injection. Pt to have labs drawn tomorrow at [facility]. This RN voiced understanding.

## 2017-07-11 NOTE — Telephone Encounter (Signed)
Scheduled appt per 6/20 sch msg - pt is aware of appt date and time.

## 2017-07-15 ENCOUNTER — Encounter: Payer: Self-pay | Admitting: Oncology

## 2017-07-15 ENCOUNTER — Inpatient Hospital Stay: Payer: 59 | Attending: Oncology | Admitting: Oncology

## 2017-07-15 ENCOUNTER — Telehealth: Payer: Self-pay

## 2017-07-15 ENCOUNTER — Inpatient Hospital Stay: Payer: 59

## 2017-07-15 VITALS — BP 136/84 | HR 67 | Temp 98.6°F | Resp 19 | Ht 68.0 in | Wt 188.6 lb

## 2017-07-15 DIAGNOSIS — C8468 Anaplastic large cell lymphoma, ALK-positive, lymph nodes of multiple sites: Secondary | ICD-10-CM | POA: Diagnosis not present

## 2017-07-15 DIAGNOSIS — C8590 Non-Hodgkin lymphoma, unspecified, unspecified site: Secondary | ICD-10-CM

## 2017-07-15 DIAGNOSIS — Z5189 Encounter for other specified aftercare: Secondary | ICD-10-CM | POA: Diagnosis not present

## 2017-07-15 DIAGNOSIS — Z5112 Encounter for antineoplastic immunotherapy: Secondary | ICD-10-CM | POA: Diagnosis not present

## 2017-07-15 DIAGNOSIS — Z95828 Presence of other vascular implants and grafts: Secondary | ICD-10-CM

## 2017-07-15 MED ORDER — PEGFILGRASTIM-CBQV 6 MG/0.6ML ~~LOC~~ SOSY
6.0000 mg | PREFILLED_SYRINGE | Freq: Once | SUBCUTANEOUS | Status: AC
  Administered 2017-07-15: 6 mg via SUBCUTANEOUS

## 2017-07-15 MED ORDER — PEGFILGRASTIM-CBQV 6 MG/0.6ML ~~LOC~~ SOSY
PREFILLED_SYRINGE | SUBCUTANEOUS | Status: AC
  Filled 2017-07-15: qty 0.6

## 2017-07-15 NOTE — Telephone Encounter (Signed)
Printed avs and calender of upcoming appointment. Per 6/24 los 

## 2017-07-15 NOTE — Patient Instructions (Signed)
Pegfilgrastim injection What is this medicine? PEGFILGRASTIM (PEG fil gra stim) is a long-acting granulocyte colony-stimulating factor that stimulates the growth of neutrophils, a type of white blood cell important in the body's fight against infection. It is used to reduce the incidence of fever and infection in patients with certain types of cancer who are receiving chemotherapy that affects the bone marrow, and to increase survival after being exposed to high doses of radiation. This medicine may be used for other purposes; ask your health care provider or pharmacist if you have questions. COMMON BRAND NAME(S): Neulasta What should I tell my health care provider before I take this medicine? They need to know if you have any of these conditions: -kidney disease -latex allergy -ongoing radiation therapy -sickle cell disease -skin reactions to acrylic adhesives (On-Body Injector only) -an unusual or allergic reaction to pegfilgrastim, filgrastim, other medicines, foods, dyes, or preservatives -pregnant or trying to get pregnant -breast-feeding How should I use this medicine? This medicine is for injection under the skin. If you get this medicine at home, you will be taught how to prepare and give the pre-filled syringe or how to use the On-body Injector. Refer to the patient Instructions for Use for detailed instructions. Use exactly as directed. Tell your healthcare provider immediately if you suspect that the On-body Injector may not have performed as intended or if you suspect the use of the On-body Injector resulted in a missed or partial dose. It is important that you put your used needles and syringes in a special sharps container. Do not put them in a trash can. If you do not have a sharps container, call your pharmacist or healthcare provider to get one. Talk to your pediatrician regarding the use of this medicine in children. While this drug may be prescribed for selected conditions,  precautions do apply. Overdosage: If you think you have taken too much of this medicine contact a poison control center or emergency room at once. NOTE: This medicine is only for you. Do not share this medicine with others. What if I miss a dose? It is important not to miss your dose. Call your doctor or health care professional if you miss your dose. If you miss a dose due to an On-body Injector failure or leakage, a new dose should be administered as soon as possible using a single prefilled syringe for manual use. What may interact with this medicine? Interactions have not been studied. Give your health care provider a list of all the medicines, herbs, non-prescription drugs, or dietary supplements you use. Also tell them if you smoke, drink alcohol, or use illegal drugs. Some items may interact with your medicine. This list may not describe all possible interactions. Give your health care provider a list of all the medicines, herbs, non-prescription drugs, or dietary supplements you use. Also tell them if you smoke, drink alcohol, or use illegal drugs. Some items may interact with your medicine. What should I watch for while using this medicine? You may need blood work done while you are taking this medicine. If you are going to need a MRI, CT scan, or other procedure, tell your doctor that you are using this medicine (On-Body Injector only). What side effects may I notice from receiving this medicine? Side effects that you should report to your doctor or health care professional as soon as possible: -allergic reactions like skin rash, itching or hives, swelling of the face, lips, or tongue -dizziness -fever -pain, redness, or irritation at site   where injected -pinpoint red spots on the skin -red or dark-brown urine -shortness of breath or breathing problems -stomach or side pain, or pain at the shoulder -swelling -tiredness -trouble passing urine or change in the amount of urine Side  effects that usually do not require medical attention (report to your doctor or health care professional if they continue or are bothersome): -bone pain -muscle pain This list may not describe all possible side effects. Call your doctor for medical advice about side effects. You may report side effects to FDA at 1-800-FDA-1088. Where should I keep my medicine? Keep out of the reach of children. Store pre-filled syringes in a refrigerator between 2 and 8 degrees C (36 and 46 degrees F). Do not freeze. Keep in carton to protect from light. Throw away this medicine if it is left out of the refrigerator for more than 48 hours. Throw away any unused medicine after the expiration date. NOTE: This sheet is a summary. It may not cover all possible information. If you have questions about this medicine, talk to your doctor, pharmacist, or health care provider.  2018 Elsevier/Gold Standard (2016-01-05 12:58:03)  

## 2017-07-15 NOTE — Progress Notes (Signed)
[place] OFFICE PROGRESS NOTE   Diagnosis: Non-Hodgkin's lymphoma  INTERVAL HISTORY:   [name] continues treatment on study at [facility].  He completed cycle 4 CPI-613 and bendamustine on 07/13/2017.  He reports improvement in diarrhea when he takes Imodium. He had symptoms of an upper respiratory infection with sinus congestion and a fever when he was on vacation 06/29/2017.  He was prescribed an antibiotic.  He is here today to receive Neulasta. He noted enlargement of a left neck lymph node coincident with the upper respiratory infection.  He continues to have sinus congestion.  No abdominal pain.  Objective:  Vital signs in last 24 hours:  Blood pressure 136/84, pulse 67, temperature 98.6 F (37 C), temperature source Oral, resp. rate 19, height '5\' 8"'$  (1.727 m), weight 188 lb 9.6 oz (85.5 kg), SpO2 99 %.    HEENT: No thrush or ulcers Lymphatics: 0.5 cm left cervical node, 1-2 cm right axillary node, no other cervical, supraclavicular, axillary, or inguinal nodes Resp: Lungs clear bilaterally Cardio: Regular rate and rhythm GI: Mass, no hepatosplenomegaly, nontender Vascular: No leg edema  Skin: Flat crusted erythema at the parietal scalp  Portacath/PICC-without erythema  Lab Results:  Lab Results  Component Value Date   WBC 4.0 03/22/2017   HGB 13.4 03/22/2017   HCT 39.3 03/22/2017   MCV 87.9 03/22/2017   PLT 190 03/22/2017   NEUTROABS 2.7 03/08/2017    CMP  Lab Results  Component Value Date   NA 140 03/13/2017   K 4.1 03/13/2017   CL 104 03/13/2017   CO2 26 03/13/2017   GLUCOSE 95 03/13/2017   BUN 12 03/13/2017   CREATININE 0.91 03/13/2017   CALCIUM 9.5 03/13/2017   PROT 6.7 03/13/2017   ALBUMIN 3.9 03/13/2017   AST 27 03/13/2017   ALT 28 03/13/2017   ALKPHOS 32 (L) 03/13/2017   BILITOT 1.0 03/13/2017   GFRNONAA >60 03/13/2017   GFRAA >60 03/13/2017     Medications: I have reviewed the patient's current  medications.   Assessment/Plan: 1. 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  PET scan 03/20/2016 with extensive bulky hypermetabolic nodal activity in the neck, chest, abdomen and pelvis; mild splenomegaly with diffusely mildly accentuated splenic activity  Staging bone marrow biopsy 03/23/2016-negative for involvement with lymphoma  Cycle 1 EPOCH beginning 03/23/2016  Cycle 2 [facility] 04/13/2016  Restaging PET scan at [facility] 05/08/2016-lymph nodes with various degrees of FDG activity in the neck, axilla, right written him, mesentery, pelvis, and groin. Indeterminate liver lesions without FDG activity, nodular mass in the posterior medial aspect of the left thigh  Cycle 3 EPOCH04/27/2018  Cycle 1 CVP 06/21/2016  Cytoxan/prednisone plus brentuximab 07/17/2016  Cytoxan/prednisone plus brentuximab 08/07/2016 (dose reduced)  Initiation of every three-week brentuximab08/08/2016  brentuximabdose reduced 10/31/2016 secondary to neuropathy  Brentuximab further dose reduced 01/03/2017 secondary to neuropathy  Presentation with parietal scalp nodular lesion 03/08/2017  Staging PET scan 8/56/[id number] hypermetabolic lymphadenopathy in the neck, chest, abdomen/pelvis, and hypermetabolic cutaneous lesions  03/22/2017 CT biopsy left retroperitoneal nodal mass-recurrent CD30 positive T-cell lymphoma  Cycle 1 bendamustine/CPI-613 on clinical trial at [facility] 04/08/2017  CTs 05/22/2017  After 2 cycles-mixed response: Enlargement  of left supraclavicular node, right axillary node, right pelvic  sidewall mass, decreased retroperitoneal abdominal adenopathy  Cycle 3 bendamustine/CPI-613 06/03/2017  Cycle 4 bendamustine/CPI-613 07/09/2017 2. 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 He entered clinical remission following chemotherapy and remained in remission when he was seen at the cancer center 02/24/2009. 3. 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 Staging PET scan 08/23/2010 with increased FDG activity at the left tonsillar fossa and no additional evidence of lymphoma. He completed 4 cycles of R-ICE with cycle #1 beginning on 09/19/2010 and cycle #4 on 11/21/2010. Repeat head and neck examination by Dr. Constance Holster following R-ICE/rituximab showed no residual lymphoma. He began radiation consolidation on 12/18/2010, radiation was completed on 01/22/2011. 4. History of neutropenia secondary to rituximab. 5. Anemia secondary to chemotherapy, status post a red blood cell transfusion 11/30/2010. The hemoglobin has normalized. 6. History of mild thrombocytopenia secondary to chemotherapy. 7. Hypertension. 8. Left thigh mass-status post surgical excision [facility] confirming a schwannoma  PET scan at [facility] 05/08/2017-nodular mass in the left thigh adjacent to the femur  MRI 06/07/2016-infiltrative mass in the left thigh adductor muscle, separate from the schwannoma excision site  MRI 10/08/2016-marked improvement in the left adductormusclemass with a small area of enhancement remaining, no discrete mass 9. Port-A-Cath placement 03/21/2016 10. Superficial thrombus left greater saphenous vein 04/13/2016. Lovenox initiated, converted to Xarelto beginning 04/18/2016 11. 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  He has completed 4 cycles of bendamustine/CPI-613.  Restaging scans after cycle 2 revealed a mixed response.  He has palpable nodes in the left neck and right axilla today.  He will receive Neulasta today.  He is scheduled to undergo restaging CTs at [facility] during the week of 07/29/2017.  A treatment decision will be made based on the restaging CTs.  He will be referred to [facility] to consider CAR-T therapy if the CTs reveal disease progression.  He will be scheduled for an office visit here during the week of 08/12/2017.  I am available to see him sooner as needed.  25 minutes were spent with the patient today.  The majority of the time was used for counseling and coordination of care.  [name], MD  07/15/2017  9:31 AM

## 2017-08-09 ENCOUNTER — Other Ambulatory Visit: Payer: Self-pay | Admitting: Oncology

## 2017-08-12 ENCOUNTER — Other Ambulatory Visit: Payer: Self-pay | Admitting: Nurse Practitioner

## 2017-08-12 ENCOUNTER — Inpatient Hospital Stay: Payer: 59 | Attending: Oncology

## 2017-08-12 ENCOUNTER — Other Ambulatory Visit: Payer: Self-pay | Admitting: Oncology

## 2017-08-12 DIAGNOSIS — D6481 Anemia due to antineoplastic chemotherapy: Secondary | ICD-10-CM | POA: Diagnosis not present

## 2017-08-12 DIAGNOSIS — C8590 Non-Hodgkin lymphoma, unspecified, unspecified site: Secondary | ICD-10-CM

## 2017-08-12 DIAGNOSIS — Z5189 Encounter for other specified aftercare: Secondary | ICD-10-CM | POA: Diagnosis present

## 2017-08-12 DIAGNOSIS — C8331 Diffuse large B-cell lymphoma, lymph nodes of head, face, and neck: Secondary | ICD-10-CM | POA: Insufficient documentation

## 2017-08-12 DIAGNOSIS — G620 Drug-induced polyneuropathy: Secondary | ICD-10-CM | POA: Insufficient documentation

## 2017-08-12 MED ORDER — PEGFILGRASTIM INJECTION 6 MG/0.6ML ~~LOC~~
PREFILLED_SYRINGE | SUBCUTANEOUS | Status: AC
  Filled 2017-08-12: qty 0.6

## 2017-08-12 MED ORDER — PEGFILGRASTIM-CBQV 6 MG/0.6ML ~~LOC~~ SOSY
6.0000 mg | PREFILLED_SYRINGE | Freq: Once | SUBCUTANEOUS | Status: AC
  Administered 2017-08-12: 6 mg via SUBCUTANEOUS

## 2017-08-15 ENCOUNTER — Inpatient Hospital Stay (HOSPITAL_BASED_OUTPATIENT_CLINIC_OR_DEPARTMENT_OTHER): Payer: 59 | Admitting: Oncology

## 2017-08-15 ENCOUNTER — Telehealth: Payer: Self-pay | Admitting: Oncology

## 2017-08-15 VITALS — BP 129/87 | HR 75 | Temp 99.6°F | Resp 18 | Ht 68.0 in | Wt 192.6 lb

## 2017-08-15 DIAGNOSIS — C8331 Diffuse large B-cell lymphoma, lymph nodes of head, face, and neck: Secondary | ICD-10-CM | POA: Diagnosis not present

## 2017-08-15 DIAGNOSIS — G620 Drug-induced polyneuropathy: Secondary | ICD-10-CM

## 2017-08-15 DIAGNOSIS — D6481 Anemia due to antineoplastic chemotherapy: Secondary | ICD-10-CM | POA: Diagnosis not present

## 2017-08-15 MED ORDER — LORAZEPAM 0.5 MG PO TABS: ORAL_TABLET | ORAL | 0 refills | Status: DC

## 2017-08-15 NOTE — Telephone Encounter (Signed)
Faxed records to Dr. Eveline Keto office. The new pt. Coord. Will call pt with appt.

## 2017-08-15 NOTE — Telephone Encounter (Signed)
Scheduled appt per - 7/25 los - pt aware  - no print out needed.

## 2017-08-15 NOTE — Progress Notes (Signed)
[place] OFFICE PROGRESS NOTE   Diagnosis: Non-Hodgkin's lymphoma  INTERVAL HISTORY:   [name] returns as scheduled.  He has completed 5 cycles of systemic therapy on study at [facility].  A restaging evaluation after cycle 5 revealed a mixed response.  He is scheduled for cycle 6 on 09/02/2017. He feels well.  Upper airway congestion improved after starting Zyrtec.  No pain. He has noted improvement in neuropathy symptoms. Objective:  Vital signs in last 24 hours:  Blood pressure 129/87, pulse 75, temperature 99.6 F (37.6 C), temperature source Oral, resp. rate 18, height _0  (1.727 m), weight 192 lb 9.6 oz (87.4 kg), SpO2 99 %.    HEENT: No thrush, neck without mass Lymphatics: No cervical, supraclavicular, axillary, or inguinal nodes Resp: Lungs clear bilaterally Cardio: Regular rate and rhythm GI: No mass, nontender, no hepatosplenomegaly Vascular: Trace pitting edema of the low leg bilaterally    Portacath/PICC-without erythema  Lab Results:  Lab Results  Component Value Date   WBC 4.0 03/22/2017   HGB 13.4 03/22/2017   HCT 39.3 03/22/2017   MCV 87.9 03/22/2017   PLT 190 03/22/2017   NEUTROABS 2.7 03/08/2017    CMP  Lab Results  Component Value Date   NA 140 03/13/2017   K 4.1 03/13/2017   CL 104 03/13/2017   CO2 26 03/13/2017   GLUCOSE 95 03/13/2017   BUN 12 03/13/2017   CREATININE 0.91 03/13/2017   CALCIUM 9.5 03/13/2017   PROT 6.7 03/13/2017   ALBUMIN 3.9 03/13/2017   AST 27 03/13/2017   ALT 28 03/13/2017   ALKPHOS 32 (L) 03/13/2017   BILITOT 1.0 03/13/2017   GFRNONAA >60 03/13/2017   GFRAA >60 03/13/2017    No results found for: CEA1  Lab Results  Component Value Date   INR 1.09 03/22/2017    Imaging:  No results found.  Medications: I have reviewed the patient's current medications.   Assessment/Plan: 1. 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  PET scan 03/20/2016 with extensive bulky hypermetabolic nodal activity in the neck, chest, abdomen and pelvis; mild splenomegaly with diffusely mildly accentuated splenic activity  Staging bone marrow biopsy 03/23/2016-negative for involvement with lymphoma  Cycle 1 EPOCH beginning 03/23/2016  Cycle 2 [facility] 04/13/2016  Restaging PET scan at [facility] 05/08/2016-lymph nodes with various degrees of FDG activity in the neck, axilla, right written him, mesentery, pelvis, and groin. Indeterminate liver lesions without FDG activity, nodular mass in the posterior medial aspect of the left thigh  Cycle 3 EPOCH04/27/2018  Cycle 1 CVP 06/21/2016  Cytoxan/prednisone plus brentuximab 07/17/2016  Cytoxan/prednisone plus brentuximab 08/07/2016 (dose reduced)  Initiation of every three-week brentuximab08/08/2016  brentuximabdose reduced 10/31/2016 secondary to neuropathy  Brentuximab further dose reduced 01/03/2017 secondary to neuropathy  Presentation with parietal scalp nodular lesion 03/08/2017  Staging PET scan 2/95/2841-[id number] hypermetabolic lymphadenopathy in the neck, chest, abdomen/pelvis, and hypermetabolic cutaneous lesions  03/22/2017 CT biopsy left retroperitoneal nodal mass-recurrent CD30 positive T-cell lymphoma  Cycle 1 bendamustine/CPI-613on clinical trial at [facility] 04/08/2017  CTs 05/22/2017  After 2 cycles-mixed response: Enlargement of left supraclavicular node, right axillary node, right pelvic sidewall mass, decreased retroperitoneal abdominal adenopathy  Cycle  3 bendamustine/CPI-613 06/03/2017  Cycle 4 bendamustine/CPI-613  07/09/2017  CT 08/05/2017- increase in the size of a level to a lymph node, decreased size of level 3 and supraclavicular nodes, enlargement of right axillary nodes, new bilateral pulmonary nodules, decreased mediastinal, rectal peritoneal, and iliac nodes  Cycle 5 bendamustine/CPI-613 08/06/2017 2. 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 He entered clinical remission following chemotherapy and remained in remission when he was seen at the cancer center 02/24/2009. 3. 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 Staging PET scan 08/23/2010 with increased FDG activity at the left tonsillar fossa and no additional evidence of lymphoma. He completed 4 cycles of R-ICE with cycle #1 beginning on 09/19/2010 and cycle #4 on 11/21/2010. Repeat head and neck examination by Dr. Constance Holster following R-ICE/rituximab showed no residual lymphoma. He began radiation consolidation on 12/18/2010, radiation was completed on 01/22/2011. 4. History of neutropenia secondary to rituximab. 5. Anemia secondary to chemotherapy, status post a red blood cell transfusion 11/30/2010. The hemoglobin has normalized. 6. History of mild thrombocytopenia secondary to chemotherapy. 7. Hypertension. 8. 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 05/08/2017-nodular mass in the left thigh adjacent to the femur  MRI 06/07/2016-infiltrative mass in the left thigh adductor muscle, separate from the schwannoma excision site  MRI 10/08/2016-marked improvement in the left adductormusclemass with a small area of enhancement remaining, no discrete mass 9. Port-A-Cath placement 03/21/2016 10. Superficial thrombus left greater saphenous vein  04/13/2016. Lovenox initiated, converted to Xarelto beginning 04/18/2016 11. 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  He continues treatment on study at [facility].  He is scheduled for cycle 6 bendamustine/ CPI-613 on 09/02/2017.  He will receive Neulasta here on 09/09/2017.  He will undergo restaging scans after cycle 6.  He requests a referral to [facility] to discuss enrollment on a CAR-T trial in the future.  [name] will return for an office visit here 09/25/2017.  We are available to see him sooner as needed.  [name], MD  08/15/2017  8:54 AM

## 2017-09-06 ENCOUNTER — Other Ambulatory Visit: Payer: Self-pay | Admitting: Oncology

## 2017-09-06 DIAGNOSIS — C8590 Non-Hodgkin lymphoma, unspecified, unspecified site: Secondary | ICD-10-CM

## 2017-09-06 DIAGNOSIS — C8331 Diffuse large B-cell lymphoma, lymph nodes of head, face, and neck: Secondary | ICD-10-CM

## 2017-09-06 MED ORDER — PEGFILGRASTIM-CBQV 6 MG/0.6ML ~~LOC~~ SOSY: 6.0000 mg | PREFILLED_SYRINGE | Freq: Once | SUBCUTANEOUS | Status: DC

## 2017-09-09 ENCOUNTER — Inpatient Hospital Stay: Payer: 59 | Attending: Oncology

## 2017-09-09 VITALS — BP 123/81 | HR 75 | Temp 98.8°F | Resp 19

## 2017-09-09 DIAGNOSIS — Z87891 Personal history of nicotine dependence: Secondary | ICD-10-CM | POA: Insufficient documentation

## 2017-09-09 DIAGNOSIS — R509 Fever, unspecified: Secondary | ICD-10-CM | POA: Insufficient documentation

## 2017-09-09 DIAGNOSIS — R5383 Other fatigue: Secondary | ICD-10-CM | POA: Diagnosis not present

## 2017-09-09 DIAGNOSIS — Z5189 Encounter for other specified aftercare: Secondary | ICD-10-CM | POA: Diagnosis present

## 2017-09-09 DIAGNOSIS — C8331 Diffuse large B-cell lymphoma, lymph nodes of head, face, and neck: Secondary | ICD-10-CM | POA: Insufficient documentation

## 2017-09-09 DIAGNOSIS — R05 Cough: Secondary | ICD-10-CM | POA: Insufficient documentation

## 2017-09-09 MED ORDER — PEGFILGRASTIM-CBQV 6 MG/0.6ML ~~LOC~~ SOSY
PREFILLED_SYRINGE | SUBCUTANEOUS | Status: AC
  Filled 2017-09-09: qty 0.6

## 2017-09-09 MED ORDER — PEGFILGRASTIM-CBQV 6 MG/0.6ML ~~LOC~~ SOSY
6.0000 mg | PREFILLED_SYRINGE | Freq: Once | SUBCUTANEOUS | Status: AC
  Administered 2017-09-09: 6 mg via SUBCUTANEOUS

## 2017-09-09 NOTE — Patient Instructions (Signed)
Pegfilgrastim injection What is this medicine? PEGFILGRASTIM (PEG fil gra stim) is a long-acting granulocyte colony-stimulating factor that stimulates the growth of neutrophils, a type of white blood cell important in the body's fight against infection. It is used to reduce the incidence of fever and infection in patients with certain types of cancer who are receiving chemotherapy that affects the bone marrow, and to increase survival after being exposed to high doses of radiation. This medicine may be used for other purposes; ask your health care provider or pharmacist if you have questions. COMMON BRAND NAME(S): Neulasta What should I tell my health care provider before I take this medicine? They need to know if you have any of these conditions: -kidney disease -latex allergy -ongoing radiation therapy -sickle cell disease -skin reactions to acrylic adhesives (On-Body Injector only) -an unusual or allergic reaction to pegfilgrastim, filgrastim, other medicines, foods, dyes, or preservatives -pregnant or trying to get pregnant -breast-feeding How should I use this medicine? This medicine is for injection under the skin. If you get this medicine at home, you will be taught how to prepare and give the pre-filled syringe or how to use the On-body Injector. Refer to the patient Instructions for Use for detailed instructions. Use exactly as directed. Tell your healthcare provider immediately if you suspect that the On-body Injector may not have performed as intended or if you suspect the use of the On-body Injector resulted in a missed or partial dose. It is important that you put your used needles and syringes in a special sharps container. Do not put them in a trash can. If you do not have a sharps container, call your pharmacist or healthcare provider to get one. Talk to your pediatrician regarding the use of this medicine in children. While this drug may be prescribed for selected conditions,  precautions do apply. Overdosage: If you think you have taken too much of this medicine contact a poison control center or emergency room at once. NOTE: This medicine is only for you. Do not share this medicine with others. What if I miss a dose? It is important not to miss your dose. Call your doctor or health care professional if you miss your dose. If you miss a dose due to an On-body Injector failure or leakage, a new dose should be administered as soon as possible using a single prefilled syringe for manual use. What may interact with this medicine? Interactions have not been studied. Give your health care provider a list of all the medicines, herbs, non-prescription drugs, or dietary supplements you use. Also tell them if you smoke, drink alcohol, or use illegal drugs. Some items may interact with your medicine. This list may not describe all possible interactions. Give your health care provider a list of all the medicines, herbs, non-prescription drugs, or dietary supplements you use. Also tell them if you smoke, drink alcohol, or use illegal drugs. Some items may interact with your medicine. What should I watch for while using this medicine? You may need blood work done while you are taking this medicine. If you are going to need a MRI, CT scan, or other procedure, tell your doctor that you are using this medicine (On-Body Injector only). What side effects may I notice from receiving this medicine? Side effects that you should report to your doctor or health care professional as soon as possible: -allergic reactions like skin rash, itching or hives, swelling of the face, lips, or tongue -dizziness -fever -pain, redness, or irritation at site   where injected -pinpoint red spots on the skin -red or dark-brown urine -shortness of breath or breathing problems -stomach or side pain, or pain at the shoulder -swelling -tiredness -trouble passing urine or change in the amount of urine Side  effects that usually do not require medical attention (report to your doctor or health care professional if they continue or are bothersome): -bone pain -muscle pain This list may not describe all possible side effects. Call your doctor for medical advice about side effects. You may report side effects to FDA at 1-800-FDA-1088. Where should I keep my medicine? Keep out of the reach of children. Store pre-filled syringes in a refrigerator between 2 and 8 degrees C (36 and 46 degrees F). Do not freeze. Keep in carton to protect from light. Throw away this medicine if it is left out of the refrigerator for more than 48 hours. Throw away any unused medicine after the expiration date. NOTE: This sheet is a summary. It may not cover all possible information. If you have questions about this medicine, talk to your doctor, pharmacist, or health care provider.  2018 Elsevier/Gold Standard (2016-01-05 12:58:03)  

## 2017-09-19 ENCOUNTER — Inpatient Hospital Stay: Payer: 59

## 2017-09-19 ENCOUNTER — Inpatient Hospital Stay (HOSPITAL_BASED_OUTPATIENT_CLINIC_OR_DEPARTMENT_OTHER): Payer: 59 | Admitting: Medical

## 2017-09-19 ENCOUNTER — Telehealth: Payer: Self-pay | Admitting: *Deleted

## 2017-09-19 ENCOUNTER — Other Ambulatory Visit: Payer: Self-pay | Admitting: *Deleted

## 2017-09-19 ENCOUNTER — Encounter: Payer: Self-pay | Admitting: Medical

## 2017-09-19 ENCOUNTER — Ambulatory Visit (HOSPITAL_COMMUNITY)
Admission: RE | Admit: 2017-09-19 | Discharge: 2017-09-19 | Disposition: A | Discharge status: Home / Self Care | Payer: 59 | Source: Ambulatory Visit | Attending: Medical | Admitting: Medical

## 2017-09-19 VITALS — BP 135/90 | HR 82 | Temp 99.8°F | Resp 20 | Ht 68.0 in | Wt 186.7 lb

## 2017-09-19 DIAGNOSIS — C8331 Diffuse large B-cell lymphoma, lymph nodes of head, face, and neck: Secondary | ICD-10-CM

## 2017-09-19 DIAGNOSIS — R509 Fever, unspecified: Secondary | ICD-10-CM

## 2017-09-19 DIAGNOSIS — R05 Cough: Secondary | ICD-10-CM

## 2017-09-19 DIAGNOSIS — R5383 Other fatigue: Secondary | ICD-10-CM | POA: Diagnosis not present

## 2017-09-19 DIAGNOSIS — R059 Cough, unspecified: Secondary | ICD-10-CM

## 2017-09-19 DIAGNOSIS — J181 Lobar pneumonia, unspecified organism: Secondary | ICD-10-CM | POA: Diagnosis not present

## 2017-09-19 DIAGNOSIS — R911 Solitary pulmonary nodule: Secondary | ICD-10-CM

## 2017-09-19 DIAGNOSIS — C8590 Non-Hodgkin lymphoma, unspecified, unspecified site: Secondary | ICD-10-CM

## 2017-09-19 DIAGNOSIS — Z87891 Personal history of nicotine dependence: Secondary | ICD-10-CM | POA: Diagnosis not present

## 2017-09-19 DIAGNOSIS — J9801 Acute bronchospasm: Secondary | ICD-10-CM | POA: Diagnosis not present

## 2017-09-19 DIAGNOSIS — Z5189 Encounter for other specified aftercare: Secondary | ICD-10-CM | POA: Diagnosis present

## 2017-09-19 DIAGNOSIS — Z95828 Presence of other vascular implants and grafts: Secondary | ICD-10-CM

## 2017-09-19 LAB — CMP (CANCER CENTER ONLY)
ALT: 10 U/L (ref 0–44)
ANION GAP: 10 (ref 5–15)
AST: 25 U/L (ref 15–41)
Albumin: 3.3 g/dL — ABNORMAL LOW (ref 3.5–5.0)
Alkaline Phosphatase: 43 U/L (ref 38–126)
BUN: 11 mg/dL (ref 6–20)
CALCIUM: 9.1 mg/dL (ref 8.9–10.3)
CHLORIDE: 105 mmol/L (ref 98–111)
CO2: 25 mmol/L (ref 22–32)
Creatinine: 0.8 mg/dL (ref 0.61–1.24)
GFR, Estimated: >60 mL/min (ref >60)
Glucose, Bld: 108 mg/dL — ABNORMAL HIGH (ref 70–99)
POTASSIUM: 4.2 mmol/L (ref 3.5–5.1)
SODIUM: 140 mmol/L (ref 135–145)
Total Bilirubin: 0.6 mg/dL (ref 0.3–1.2)
Total Protein: 6.2 g/dL — ABNORMAL LOW (ref 6.5–8.1)

## 2017-09-19 LAB — CBC WITH DIFFERENTIAL (CANCER CENTER ONLY)
BASOS PCT: 0 %
Basophils Absolute: 0 10*3/uL (ref 0.0–0.1)
Eosinophils Absolute: 0.1 10*3/uL (ref 0.0–0.5)
Eosinophils Relative: 1 %
HEMATOCRIT: 36.2 % — AB (ref 38.4–49.9)
HEMOGLOBIN: 12.4 g/dL — AB (ref 13.0–17.1)
LYMPHS ABS: 0.7 10*3/uL — AB (ref 0.9–3.3)
Lymphocytes Relative: 7 %
MCH: 33.7 pg — ABNORMAL HIGH (ref 27.2–33.4)
MCHC: 34.3 g/dL (ref 32.0–36.0)
MCV: 98.4 fL — ABNORMAL HIGH (ref 79.3–98.0)
MONOS PCT: 5 %
Monocytes Absolute: 0.5 10*3/uL (ref 0.1–0.9)
NEUTROS ABS: 8.6 10*3/uL — AB (ref 1.5–6.5)
NEUTROS PCT: 87 %
Platelet Count: 131 10*3/uL — ABNORMAL LOW (ref 140–400)
RBC: 3.68 MIL/uL — ABNORMAL LOW (ref 4.20–5.82)
RDW: 15.2 % — ABNORMAL HIGH (ref 11.0–14.6)
WBC Count: 9.8 10*3/uL (ref 4.0–10.3)

## 2017-09-19 MED ORDER — HEPARIN SOD (PORK) LOCK FLUSH 100 UNIT/ML IV SOLN
500.0000 [IU] | Freq: Once | INTRAVENOUS | Status: AC
  Administered 2017-09-19: 500 [IU]
  Filled 2017-09-19: qty 5

## 2017-09-19 MED ORDER — SODIUM CHLORIDE 0.9% FLUSH
10.0000 mL | Freq: Once | INTRAVENOUS | Status: AC
  Administered 2017-09-19: 10 mL
  Filled 2017-09-19: qty 10

## 2017-09-19 MED ORDER — HYDROCOD POLST-CPM POLST ER 10-8 MG/5ML PO SUER: 5.0000 mL | Freq: Two times a day (BID) | ORAL | 0 refills | Status: DC | PRN

## 2017-09-19 NOTE — Telephone Encounter (Signed)
Voicem ail message received @ 8:10 am from pt's wife stating that her husband is feeling more SOB and wheezing this and is requesting a visit with either Dr. [name] or Sandi Mealy, PA TC back to pt home. Spoke with pt. He states he had a rough night with increased resp effort, shallow breathing, wheezing and an increased cough.  He states he has had a fever in the last 3 days of Tmax 101  He has completed a clinical trial at [facility] recently.  Will have pt come in this morning for labs and to see Sandi Mealy, PA

## 2017-09-19 NOTE — Patient Instructions (Signed)
Implanted Port Home Guide An implanted port is a type of central line that is placed under the skin. Central lines are used to provide IV access when treatment or nutrition needs to be given through a person's veins. Implanted ports are used for long-term IV access. An implanted port may be placed because:  You need IV medicine that would be irritating to the small veins in your hands or arms.  You need long-term IV medicines, such as antibiotics.  You need IV nutrition for a long period.  You need frequent blood draws for lab tests.  You need dialysis.  Implanted ports are usually placed in the chest area, but they can also be placed in the upper arm, the abdomen, or the leg. An implanted port has two main parts:  Reservoir. The reservoir is round and will appear as a small, raised area under your skin. The reservoir is the part where a needle is inserted to give medicines or draw blood.  Catheter. The catheter is a thin, flexible tube that extends from the reservoir. The catheter is placed into a large vein. Medicine that is inserted into the reservoir goes into the catheter and then into the vein.  How will I care for my incision site? Do not get the incision site wet. Bathe or shower as directed by your health care provider. How is my port accessed? Special steps must be taken to access the port:  Before the port is accessed, a numbing cream can be placed on the skin. This helps numb the skin over the port site.  Your health care provider uses a sterile technique to access the port. ? Your health care provider must put on a mask and sterile gloves. ? The skin over your port is cleaned carefully with an antiseptic and allowed to dry. ? The port is gently pinched between sterile gloves, and a needle is inserted into the port.  Only "non-coring" port needles should be used to access the port. Once the port is accessed, a blood return should be checked. This helps ensure that the port  is in the vein and is not clogged.  If your port needs to remain accessed for a constant infusion, a clear (transparent) bandage will be placed over the needle site. The bandage and needle will need to be changed every week, or as directed by your health care provider.  Keep the bandage covering the needle clean and dry. Do not get it wet. Follow your health care provider's instructions on how to take a shower or bath while the port is accessed.  If your port does not need to stay accessed, no bandage is needed over the port.  What is flushing? Flushing helps keep the port from getting clogged. Follow your health care provider's instructions on how and when to flush the port. Ports are usually flushed with saline solution or a medicine called heparin. The need for flushing will depend on how the port is used.  If the port is used for intermittent medicines or blood draws, the port will need to be flushed: ? After medicines have been given. ? After blood has been drawn. ? As part of routine maintenance.  If a constant infusion is running, the port may not need to be flushed.  How long will my port stay implanted? The port can stay in for as long as your health care provider thinks it is needed. When it is time for the port to come out, surgery will be   done to remove it. The procedure is similar to the one performed when the port was put in. When should I seek immediate medical care? When you have an implanted port, you should seek immediate medical care if:  You notice a bad smell coming from the incision site.  You have swelling, redness, or drainage at the incision site.  You have more swelling or pain at the port site or the surrounding area.  You have a fever that is not controlled with medicine.  This information is not intended to replace advice given to you by your health care provider. Make sure you discuss any questions you have with your health care provider. Document  Released: 01/08/2005 Document Revised: 06/16/2015 Document Reviewed: 09/15/2012 Elsevier Interactive Patient Education  2017 Elsevier Inc.  

## 2017-09-19 NOTE — Progress Notes (Signed)
These results were called to [name] and were reviewed with him . He was given Tussionex today and was told to start the Levaquin given to him by [facility] His were answered. He expressed understanding.

## 2017-09-20 ENCOUNTER — Other Ambulatory Visit: Payer: Self-pay

## 2017-09-20 ENCOUNTER — Inpatient Hospital Stay (HOSPITAL_COMMUNITY)
Admission: EM | Admit: 2017-09-20 | Discharge: 2017-09-26 | DRG: 193 | Disposition: A | Discharge status: Home Health | Payer: 59 | Attending: Family Medicine | Admitting: Family Medicine

## 2017-09-20 ENCOUNTER — Encounter (HOSPITAL_COMMUNITY): Payer: Self-pay | Admitting: *Deleted

## 2017-09-20 ENCOUNTER — Emergency Department (HOSPITAL_COMMUNITY): Payer: 59

## 2017-09-20 ENCOUNTER — Other Ambulatory Visit: Payer: Self-pay | Admitting: Oncology

## 2017-09-20 DIAGNOSIS — D630 Anemia in neoplastic disease: Secondary | ICD-10-CM | POA: Diagnosis present

## 2017-09-20 DIAGNOSIS — C8510 Unspecified B-cell lymphoma, unspecified site: Secondary | ICD-10-CM | POA: Diagnosis present

## 2017-09-20 DIAGNOSIS — Z923 Personal history of irradiation: Secondary | ICD-10-CM

## 2017-09-20 DIAGNOSIS — Z888 Allergy status to other drugs, medicaments and biological substances status: Secondary | ICD-10-CM

## 2017-09-20 DIAGNOSIS — J9601 Acute respiratory failure with hypoxia: Secondary | ICD-10-CM | POA: Diagnosis present

## 2017-09-20 DIAGNOSIS — J90 Pleural effusion, not elsewhere classified: Secondary | ICD-10-CM | POA: Diagnosis present

## 2017-09-20 DIAGNOSIS — Z86718 Personal history of other venous thrombosis and embolism: Secondary | ICD-10-CM | POA: Diagnosis not present

## 2017-09-20 DIAGNOSIS — T451X5A Adverse effect of antineoplastic and immunosuppressive drugs, initial encounter: Secondary | ICD-10-CM | POA: Diagnosis present

## 2017-09-20 DIAGNOSIS — R918 Other nonspecific abnormal finding of lung field: Secondary | ICD-10-CM | POA: Diagnosis not present

## 2017-09-20 DIAGNOSIS — Z7901 Long term (current) use of anticoagulants: Secondary | ICD-10-CM | POA: Diagnosis not present

## 2017-09-20 DIAGNOSIS — J189 Pneumonia, unspecified organism: Secondary | ICD-10-CM | POA: Diagnosis present

## 2017-09-20 DIAGNOSIS — D61818 Other pancytopenia: Secondary | ICD-10-CM | POA: Diagnosis not present

## 2017-09-20 DIAGNOSIS — C799 Secondary malignant neoplasm of unspecified site: Secondary | ICD-10-CM

## 2017-09-20 DIAGNOSIS — G629 Polyneuropathy, unspecified: Secondary | ICD-10-CM | POA: Diagnosis present

## 2017-09-20 DIAGNOSIS — J9801 Acute bronchospasm: Secondary | ICD-10-CM | POA: Diagnosis not present

## 2017-09-20 DIAGNOSIS — D6959 Other secondary thrombocytopenia: Secondary | ICD-10-CM | POA: Diagnosis present

## 2017-09-20 DIAGNOSIS — R0902 Hypoxemia: Secondary | ICD-10-CM

## 2017-09-20 DIAGNOSIS — I10 Essential (primary) hypertension: Secondary | ICD-10-CM | POA: Diagnosis present

## 2017-09-20 DIAGNOSIS — I82819 Embolism and thrombosis of superficial veins of unspecified lower extremities: Secondary | ICD-10-CM | POA: Diagnosis present

## 2017-09-20 DIAGNOSIS — C7800 Secondary malignant neoplasm of unspecified lung: Secondary | ICD-10-CM | POA: Diagnosis present

## 2017-09-20 DIAGNOSIS — J9600 Acute respiratory failure, unspecified whether with hypoxia or hypercapnia: Secondary | ICD-10-CM | POA: Diagnosis present

## 2017-09-20 DIAGNOSIS — Z79899 Other long term (current) drug therapy: Secondary | ICD-10-CM

## 2017-09-20 DIAGNOSIS — C8331 Diffuse large B-cell lymphoma, lymph nodes of head, face, and neck: Secondary | ICD-10-CM | POA: Diagnosis not present

## 2017-09-20 DIAGNOSIS — Z880 Allergy status to penicillin: Secondary | ICD-10-CM

## 2017-09-20 DIAGNOSIS — Z87891 Personal history of nicotine dependence: Secondary | ICD-10-CM

## 2017-09-20 DIAGNOSIS — J181 Lobar pneumonia, unspecified organism: Secondary | ICD-10-CM | POA: Diagnosis not present

## 2017-09-20 DIAGNOSIS — C8511 Unspecified B-cell lymphoma, lymph nodes of head, face, and neck: Secondary | ICD-10-CM | POA: Diagnosis not present

## 2017-09-20 DIAGNOSIS — C8590 Non-Hodgkin lymphoma, unspecified, unspecified site: Secondary | ICD-10-CM

## 2017-09-20 DIAGNOSIS — Y95 Nosocomial condition: Secondary | ICD-10-CM | POA: Diagnosis present

## 2017-09-20 DIAGNOSIS — Z8572 Personal history of non-Hodgkin lymphomas: Secondary | ICD-10-CM | POA: Diagnosis present

## 2017-09-20 DIAGNOSIS — R5081 Fever presenting with conditions classified elsewhere: Secondary | ICD-10-CM | POA: Diagnosis not present

## 2017-09-20 LAB — BLOOD GAS, VENOUS
Acid-base deficit: 2.1 mmol/L — ABNORMAL HIGH (ref 0.0–2.0)
Bicarbonate: 22.5 mmol/L (ref 20.0–28.0)
DRAWN BY: 11249
O2 CONTENT: 5 L/min
O2 Saturation: 68 %
PATIENT TEMPERATURE: 98.6
PH VEN: 7.365 (ref 7.250–7.430)
pCO2, Ven: 40.4 mmHg — ABNORMAL LOW (ref 44.0–60.0)
pO2, Ven: 42.6 mmHg (ref 32.0–45.0)

## 2017-09-20 LAB — COMPREHENSIVE METABOLIC PANEL
ALT: 14 U/L (ref 0–44)
AST: 27 U/L (ref 15–41)
Albumin: 3.4 g/dL — ABNORMAL LOW (ref 3.5–5.0)
Alkaline Phosphatase: 38 U/L (ref 38–126)
Anion gap: 10 (ref 5–15)
BUN: 15 mg/dL (ref 6–20)
CO2: 26 mmol/L (ref 22–32)
CREATININE: 0.88 mg/dL (ref 0.61–1.24)
Calcium: 8.9 mg/dL (ref 8.9–10.3)
Chloride: 106 mmol/L (ref 98–111)
GFR calc Af Amer: >60 mL/min (ref >60)
GFR calc non Af Amer: >60 mL/min (ref >60)
Glucose, Bld: 143 mg/dL — ABNORMAL HIGH (ref 70–99)
POTASSIUM: 3.9 mmol/L (ref 3.5–5.1)
SODIUM: 142 mmol/L (ref 135–145)
Total Bilirubin: 0.9 mg/dL (ref 0.3–1.2)
Total Protein: 6 g/dL — ABNORMAL LOW (ref 6.5–8.1)

## 2017-09-20 LAB — LACTATE DEHYDROGENASE: LDH: 265 U/L — ABNORMAL HIGH (ref 98–192)

## 2017-09-20 LAB — URINALYSIS, ROUTINE W REFLEX MICROSCOPIC
Bilirubin Urine: NEGATIVE
Glucose, UA: NEGATIVE mg/dL
Hgb urine dipstick: NEGATIVE
Ketones, ur: NEGATIVE mg/dL
LEUKOCYTES UA: NEGATIVE
Nitrite: NEGATIVE
PH: 5 (ref 5.0–8.0)
Protein, ur: NEGATIVE mg/dL
SPECIFIC GRAVITY, URINE: 1.029 (ref 1.005–1.030)

## 2017-09-20 LAB — CBC WITH DIFFERENTIAL/PLATELET
Basophils Absolute: 0 10*3/uL (ref 0.0–0.1)
Basophils Relative: 0 %
EOS ABS: 0 10*3/uL (ref 0.0–0.7)
EOS PCT: 0 %
HCT: 36.7 % — ABNORMAL LOW (ref 39.0–52.0)
HEMOGLOBIN: 12.3 g/dL — AB (ref 13.0–17.0)
LYMPHS ABS: 0.6 10*3/uL — AB (ref 0.7–4.0)
LYMPHS PCT: 6 %
MCH: 33.1 pg (ref 26.0–34.0)
MCHC: 33.5 g/dL (ref 30.0–36.0)
MCV: 98.7 fL (ref 78.0–100.0)
MONOS PCT: 6 %
Monocytes Absolute: 0.7 10*3/uL (ref 0.1–1.0)
NEUTROS PCT: 88 %
Neutro Abs: 9.5 10*3/uL — ABNORMAL HIGH (ref 1.7–7.7)
Platelets: 127 10*3/uL — ABNORMAL LOW (ref 150–400)
RBC: 3.72 MIL/uL — AB (ref 4.22–5.81)
RDW: 15.2 % (ref 11.5–15.5)
WBC: 10.9 10*3/uL — ABNORMAL HIGH (ref 4.0–10.5)

## 2017-09-20 LAB — STREP PNEUMONIAE URINARY ANTIGEN: STREP PNEUMO URINARY ANTIGEN: NEGATIVE

## 2017-09-20 LAB — I-STAT CG4 LACTIC ACID, ED: Lactic Acid, Venous: 1.17 mmol/L (ref 0.5–1.9)

## 2017-09-20 MED ORDER — IPRATROPIUM-ALBUTEROL 0.5-2.5 (3) MG/3ML IN SOLN
3.0000 mL | Freq: Four times a day (QID) | RESPIRATORY_TRACT | Status: DC
  Administered 2017-09-20: 3 mL via RESPIRATORY_TRACT
  Filled 2017-09-20: qty 3

## 2017-09-20 MED ORDER — SODIUM CHLORIDE 0.9 % IV SOLN
1.0000 g | Freq: Three times a day (TID) | INTRAVENOUS | Status: DC
  Administered 2017-09-20 – 2017-09-25 (×15): 1 g via INTRAVENOUS
  Filled 2017-09-20 (×16): qty 1

## 2017-09-20 MED ORDER — PREDNISONE 20 MG PO TABS
40.0000 mg | ORAL_TABLET | Freq: Every day | ORAL | Status: AC
  Administered 2017-09-21 – 2017-09-25 (×5): 40 mg via ORAL
  Filled 2017-09-20 (×5): qty 2

## 2017-09-20 MED ORDER — SODIUM CHLORIDE 0.9 % IV BOLUS (SEPSIS)
1000.0000 mL | Freq: Once | INTRAVENOUS | Status: AC
  Administered 2017-09-20: 1000 mL via INTRAVENOUS

## 2017-09-20 MED ORDER — ACETAMINOPHEN 325 MG PO TABS
650.0000 mg | ORAL_TABLET | ORAL | Status: DC | PRN
  Administered 2017-09-20 – 2017-09-24 (×4): 650 mg via ORAL
  Filled 2017-09-20 (×4): qty 2

## 2017-09-20 MED ORDER — ACETAMINOPHEN 500 MG PO TABS
1000.0000 mg | ORAL_TABLET | Freq: Once | ORAL | Status: AC
  Administered 2017-09-20: 1000 mg via ORAL
  Filled 2017-09-20: qty 2

## 2017-09-20 MED ORDER — VANCOMYCIN HCL IN DEXTROSE 1-5 GM/200ML-% IV SOLN
1000.0000 mg | Freq: Two times a day (BID) | INTRAVENOUS | Status: DC
  Administered 2017-09-20 – 2017-09-24 (×8): 1000 mg via INTRAVENOUS
  Filled 2017-09-20 (×8): qty 200

## 2017-09-20 MED ORDER — OXYCODONE-ACETAMINOPHEN 5-325 MG PO TABS: 1.0000 | ORAL_TABLET | Freq: Four times a day (QID) | ORAL | Status: DC | PRN

## 2017-09-20 MED ORDER — SODIUM CHLORIDE 0.9 % IV SOLN
INTRAVENOUS | Status: DC
  Administered 2017-09-20 – 2017-09-21 (×2): via INTRAVENOUS

## 2017-09-20 MED ORDER — BENZONATATE 100 MG PO CAPS
100.0000 mg | ORAL_CAPSULE | Freq: Three times a day (TID) | ORAL | Status: DC
  Administered 2017-09-20 – 2017-09-26 (×19): 100 mg via ORAL
  Filled 2017-09-20 (×20): qty 1

## 2017-09-20 MED ORDER — IPRATROPIUM-ALBUTEROL 0.5-2.5 (3) MG/3ML IN SOLN
3.0000 mL | Freq: Three times a day (TID) | RESPIRATORY_TRACT | Status: DC
  Administered 2017-09-20 – 2017-09-21 (×4): 3 mL via RESPIRATORY_TRACT
  Filled 2017-09-20 (×5): qty 3

## 2017-09-20 MED ORDER — GUAIFENESIN-DM 100-10 MG/5ML PO SYRP
5.0000 mL | ORAL_SOLUTION | ORAL | Status: DC | PRN
  Administered 2017-09-23: 5 mL via ORAL
  Filled 2017-09-20: qty 10

## 2017-09-20 MED ORDER — LORATADINE 10 MG PO TABS
10.0000 mg | ORAL_TABLET | Freq: Every day | ORAL | Status: DC
  Administered 2017-09-20 – 2017-09-21 (×2): 10 mg via ORAL
  Filled 2017-09-20 (×2): qty 1

## 2017-09-20 MED ORDER — ATENOLOL 50 MG PO TABS
50.0000 mg | ORAL_TABLET | Freq: Every day | ORAL | Status: DC
  Administered 2017-09-20 – 2017-09-26 (×7): 50 mg via ORAL
  Filled 2017-09-20 (×7): qty 1

## 2017-09-20 MED ORDER — ALBUTEROL SULFATE (2.5 MG/3ML) 0.083% IN NEBU: 2.5000 mg | INHALATION_SOLUTION | RESPIRATORY_TRACT | Status: DC | PRN

## 2017-09-20 MED ORDER — IOPAMIDOL (ISOVUE-300) INJECTION 61%
INTRAVENOUS | Status: AC
  Administered 2017-09-20: 100 mL
  Filled 2017-09-20: qty 100

## 2017-09-20 MED ORDER — ALBUTEROL SULFATE (2.5 MG/3ML) 0.083% IN NEBU
5.0000 mg | INHALATION_SOLUTION | Freq: Once | RESPIRATORY_TRACT | Status: AC
  Administered 2017-09-20: 5 mg via RESPIRATORY_TRACT
  Filled 2017-09-20: qty 6

## 2017-09-20 MED ORDER — RIVAROXABAN 20 MG PO TABS
20.0000 mg | ORAL_TABLET | Freq: Every day | ORAL | Status: DC
  Administered 2017-09-20 – 2017-09-22 (×3): 20 mg via ORAL
  Filled 2017-09-20 (×3): qty 1

## 2017-09-20 MED ORDER — SODIUM CHLORIDE 0.9% FLUSH: 10.0000 mL | INTRAVENOUS | Status: DC | PRN

## 2017-09-20 MED ORDER — LEVOFLOXACIN IN D5W 750 MG/150ML IV SOLN
750.0000 mg | Freq: Once | INTRAVENOUS | Status: AC
  Administered 2017-09-20: 750 mg via INTRAVENOUS
  Filled 2017-09-20: qty 150

## 2017-09-20 MED ORDER — VANCOMYCIN HCL 10 G IV SOLR
1750.0000 mg | INTRAVENOUS | Status: AC
  Administered 2017-09-20: 1750 mg via INTRAVENOUS
  Filled 2017-09-20: qty 1750

## 2017-09-20 MED ORDER — LORAZEPAM 0.5 MG PO TABS
0.5000 mg | ORAL_TABLET | Freq: Every evening | ORAL | Status: DC | PRN
  Administered 2017-09-20: 1 mg via ORAL
  Administered 2017-09-21 – 2017-09-25 (×5): 0.5 mg via ORAL
  Filled 2017-09-20: qty 2
  Filled 2017-09-20 (×5): qty 1

## 2017-09-20 MED ORDER — HYDROCOD POLST-CPM POLST ER 10-8 MG/5ML PO SUER
5.0000 mL | Freq: Two times a day (BID) | ORAL | Status: DC
  Administered 2017-09-20 – 2017-09-26 (×13): 5 mL via ORAL
  Filled 2017-09-20 (×13): qty 5

## 2017-09-20 NOTE — Progress Notes (Signed)
Pharmacy Antibiotic Note  [name] is a 57 y.o. male admitted on 09/20/2017 with pneumonia.  PMH significant for NHL.  In the ED, patient has received Levaquin 750mg  IV x 1 dose.  Upon admission, Pharmacy has been consulted for Vancomycin dosing; MD has ordered Cefepime 1gm IV q8h.   Plan:  Vancomycin 1750mg  IV x 1 loading dose (20mg /kg) followed by 1000mg  IV q12h (Goal AUC 400-500)  Follow renal function  Follow culture results and sensitivities    Temp (24hrs), Avg:99 F (37.2 C), Min:98.4 F (36.9 C), Max:99.8 F (37.7 C)  Recent Labs  Lab 09/19/17 0911 09/20/17 0300 09/20/17 0329  WBC 9.8 10.9*  --   CREATININE 0.80 0.88  --   LATICACIDVEN  --   --  1.17    Estimated Creatinine Clearance: 99.3 mL/min (by C-G formula based on SCr of 0.88 mg/dL).    Allergies  Allergen Reactions  . Penicillins Other (See Comments)    Childhood allergy reaction unknown  Has patient had a PCN reaction causing immediate rash, facial/tongue/throat swelling, SOB or lightheadedness with hypotension: Unknown Has patient had a PCN reaction causing severe rash involving mucus membranes or skin necrosis: Unknown Has patient had a PCN reaction that required hospitalization: Unknown Has patient had a PCN reaction occurring within the last 10 years: Unknown If all of the above answers are "NO", then may proceed with Cephalosporin use.     Antimicrobials this admission: 8/30 Levaquin x 1 dose  8/30 Vanc >>   8/30 Cefepime >>  Dose adjustments this admission:    Microbiology results: 8/30 BCx: sent  Thank you for allowing pharmacy to be a part of this patient's care.  Everette Rank, PharmD 09/20/2017 7:56 AM

## 2017-09-20 NOTE — ED Notes (Signed)
ED Provider at bedside. 

## 2017-09-20 NOTE — Progress Notes (Signed)
57 yo male with nonhodgkins lymhoma apparently found by daughter to have pox in the 70's,  Pt was brought to ER for evaluation, CT scan chest shows left lower lung pneumonia with effusion.  Wbc 10.9,  ED requesting admission for pneumonia.

## 2017-09-20 NOTE — Progress Notes (Signed)
Sympt[facility] Management Clinic Progress Note   [name] [id number] 12-Aug-1960 57 y.o.  Otilio Saber is managed by Dr. Dominica Severin B. Sherrill  Actively treated with chemotherapy/immunotherapy: yes  Current Therapy: Clinical trial CPI-613- Bendamustine at [facility]  Last Treated: 09/02/17    Assessment: Plan:    Fever, unspecified fever cause - Plan: DG Chest 2 View  Cough - Plan: DG Chest 2 View  T-cell lymphoma (HCC)   Fever and cough: The patient was referred for a chest x-ray today which showed: Interval development of nodular opacities throughout both lungs. Possible inflammatory or infectious process but cannot exclude metastatic or lymphomatous lesions.  He was given a prescription for Tussionex and was told to begin the prescription for Levaquin that he had been given and told to hold by [facility].    T-cell lymphoma: Mr. Callens is status post a clinical trial CPI-613- Bendamustine at [facility] which was last dosed on 09/02/2017.  He is awaiting a restaging PET CT scan with discussion of other therapy after that study.  Please see After Visit Summary for patient specific instructions.  Future Appointments  Date Time Provider Hermann  10/07/2017  8:00 AM [name] Izola Price, MD [facility] None    Orders Placed This Encounter  Procedures  . DG Chest 2 View       Subjective:   Patient ID:  [name] is a 57 y.o. (DOB September 01, 1960) male.  Chief Complaint:  Chief Complaint  Patient presents with  . Fatigue  . 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 Dominica Severin B. Sherrill and has most recently participated in a clinical trial at [facility] where he received bendamustine on 09/02/2017.  He has developed progressive right axillary lymphadenopathy and is awaiting a  restaging CT PET scan.  He was most recently seen at [facility] on 09/17/2017.  He had had a fever of 101 on Monday.  He was given a prescription for Levaquin but was told to hold this and to begin if his symptoms worsen.  He presents to the office today with a report of a nonproductive cough, wheezing, and dyspnea on exertion.  He continues to have a fever.  He and his wife will be traveling out of town this weekend.  Medications: I have reviewed the patient's current medications.  Allergies:  Allergies  Allergen Reactions  . Penicillins Other (See Comments)    Childhood allergy reaction unknown  Has patient had a PCN reaction causing immediate rash, facial/tongue/throat swelling, SOB or lightheadedness with hypotension: Unknown Has patient had a PCN reaction causing severe rash involving mucus membranes or skin necrosis: Unknown Has patient had a PCN reaction that required hospitalization: Unknown Has patient had a PCN reaction occurring within the last 10 years: Unknown If all of the above answers are "NO", then may proceed with Cephalosporin use.     Past Medical History:  Diagnosis Date  . History of radiation therapy 12/18/10 to 01/22/11   left oropharynx  . Hypertension   . Neutropenia    Secondary to Rituxan  . Non Hodgkin's lymphoma ([place])    s/p 4 cycles R-ICE, start 11/21/10  . T-cell lymphoma ([place]) 03/14/2016    Past Surgical History:  Procedure Laterality Date  . BIOPSY PHARYNX  09/20/10 excision right posterior pharynx  . BONE MARROW ASPIRATION      09/08/10 Biopsy , and clot, left iliac crest  . IR GENERIC HISTORICAL  03/21/2016   IR FLUORO GUIDE PORT INSERTION RIGHT 03/21/2016 Arne Cleveland, MD [facility]-INTERV RAD  . IR GENERIC HISTORICAL  03/21/2016   IR US GUIDE VASC ACCESS RIGHT 03/21/2016 Arne Cleveland, MD [facility]-INTERV RAD  . PORTACATH PLACEMENT  09/19/10  . TONSILLECTOMY     Bilateral    History reviewed. No pertinent family  history.  Social History   Socioeconomic History  . Marital status: Married    Spouse name: Not on file  . Number of children: Not on file  . Years of education: Not on file  . Highest education level: Not on file  Occupational History  . Not on file  Social Needs  . Financial resource strain: Not on file  . Food insecurity:    Worry: Not on file    Inability: Not on file  . Transportation needs:    Medical: Not on file    Non-medical: Not on file  Tobacco Use  . Smoking status: Former Smoker    Packs/day: 0.50    Years: 5.00    Pack years: 2.50    Types: Cigarettes    Last attempt to quit: 11/30/1990    Years since quitting: 26.8  . Smokeless tobacco: Never Used  Substance and Sexual Activity  . Alcohol use: No  . Drug use: No  . Sexual activity: Not Currently  Lifestyle  . Physical activity:    Days per week: Not on file    Minutes per session: Not on file  . Stress: Not on file  Relationships  . Social connections:    Talks on phone: Not on file    Gets together: Not on file    Attends religious service: Not on file    Active member of club or organization: Not on file    Attends meetings of clubs or organizations: Not on file    Relationship status: Not on file  . Intimate partner violence:    Fear of current or ex partner: Not on file    Emotionally abused: Not on file    Physically abused: Not on file    Forced sexual activity: Not on file  Other Topics Concern  . Not on file  Social History Narrative   Married, 2 children, Social research officer, government    Past Medical History, Surgical history, Social history, and Family history were reviewed and updated as appropriate.   Please see review of systems for further details on the patient's review from today.   Review of Systems:  Review of Systems  Constitutional: Positive for fever. Negative for chills, diaphoresis and fatigue.  HENT: Negative for congestion, postnasal drip, rhinorrhea and sore throat.    Respiratory: Positive for cough. Negative for shortness of breath and wheezing.        Dyspnea on exertion  Cardiovascular: Negative for palpitations.  Neurological: Negative for headaches.    Objective:   Physical Exam:  There were no vitals taken for this visit. ECOG: 0  Oxygen saturation at rest on room air:   93% Oxygen Saturation with ambulation on room air: 88%   Physical Exam  Constitutional: No distress.  HENT:  Head: Normocephalic and atraumatic.  Right Ear: External ear normal.  Left Ear: External ear normal.  Mouth/Throat: Oropharynx is clear and moist. No oropharyngeal exudate.  Neck: Normal range of motion. Neck supple.  Cardiovascular: Normal rate, regular rhythm and normal heart sounds. Exam reveals no gallop and no friction rub.  No murmur heard. Pulmonary/Chest: Effort normal and breath sounds normal. No respiratory distress. He has no wheezes. He has no rales.  Lymphadenopathy:    He has no cervical adenopathy.  Neurological: He is alert. Coordination normal.  Skin: Skin is warm and dry. No rash noted. He is not diaphoretic. No erythema.  Psychiatric: He has a normal mood and affect. His behavior is normal. Judgment and thought content normal.    Lab Review:     Component Value Date/Time   NA 142 09/20/2017 0300   NA 140 01/24/2017 0839   K 3.9 09/20/2017 0300   K 4.4 01/24/2017 0839   CL 106 09/20/2017 0300   CO2 26 09/20/2017 0300   CO2 24 01/24/2017 0839   GLUCOSE 143 (H) 09/20/2017 0300   GLUCOSE 99 01/24/2017 0839   BUN 15 09/20/2017 0300   BUN 16.8 01/24/2017 0839   CREATININE 0.88 09/20/2017 0300   CREATININE 0.80 09/19/2017 0911   CREATININE 0.9 01/24/2017 0839   CALCIUM 8.9 09/20/2017 0300   CALCIUM 9.2 01/24/2017 0839   PROT 6.0 (L) 09/20/2017 0300   PROT 6.5 01/24/2017 0839   ALBUMIN 3.4 (L) 09/20/2017 0300   ALBUMIN 3.9 01/24/2017 0839   AST 27 09/20/2017 0300   AST 25 09/19/2017 0911   AST 26 01/24/2017 0839   ALT 14  09/20/2017 0300   ALT 10 09/19/2017 0911   ALT 33 01/24/2017 0839   ALKPHOS 38 09/20/2017 0300   ALKPHOS 28 (L) 01/24/2017 0839   BILITOT 0.9 09/20/2017 0300   BILITOT 0.6 09/19/2017 0911   BILITOT 1.01 01/24/2017 0839   GFRNONAA >60 09/20/2017 0300   GFRNONAA >60 09/19/2017 0911   GFRAA >60 09/20/2017 0300   GFRAA >60 09/19/2017 0911       Component Value Date/Time   WBC 10.9 (H) 09/20/2017 0300   RBC 3.72 (L) 09/20/2017 0300   HGB 12.3 (L) 09/20/2017 0300   HGB 12.4 (L) 09/19/2017 0911   HGB 14.1 01/24/2017 0839   HCT 36.7 (L) 09/20/2017 0300   HCT 42.0 01/24/2017 0839   PLT 127 (L) 09/20/2017 0300   PLT 131 (L) 09/19/2017 0911   PLT 190 01/24/2017 0839   MCV 98.7 09/20/2017 0300   MCV 88.9 01/24/2017 0839   MCH 33.1 09/20/2017 0300   MCHC 33.5 09/20/2017 0300   RDW 15.2 09/20/2017 0300   RDW 15.2 (H) 01/24/2017 0839   LYMPHSABS 0.6 (L) 09/20/2017 0300   LYMPHSABS 0.3 (L) 01/24/2017 0839   MONOABS 0.7 09/20/2017 0300   MONOABS 0.6 01/24/2017 0839   EOSABS 0.0 09/20/2017 0300   EOSABS 0.4 01/24/2017 0839   BASOSABS 0.0 09/20/2017 0300   BASOSABS 0.0 01/24/2017 0839   -------------------------------  Imaging from last 24 hours (if applicable):  Radiology interpretation: Dg Chest 2 View  Result Date: 09/19/2017 CLINICAL DATA:  Cough for 4 days, short of breath and wheezing today, fever, history of T-cell lymphoma EXAM: CHEST - 2 VIEW COMPARISON:  Chest x-ray of 02/08/2017 and PET-CT of 03/15/2016 FINDINGS: There are now multiple nodular opacities scattered throughout both lungs. This could represent inflammatory or infectious process, but of fullness involvement of the lungs cannot be excluded. CT of the chest is recommended to evaluate further. No pleural effusion is seen. Mediastinal and hilar contours are unremarkable. Right-sided Port-A-Cath tip is seen to the lower SVC. The heart is within normal limits in size.  There are degenerative changes throughout the  thoracic spine. IMPRESSION: Interval development of nodular opacities throughout both lungs. Possible inflammatory or infectious process but cannot exclude metastatic or lymphomatous lesions. Recommend CT of the chest. Electronically Signed   By: Ivar Drape M.D.   On: 09/19/2017 12:06   Ct Chest W Contrast  Result Date: 09/20/2017 CLINICAL DATA:  57 y/o M; dyspnea and hypoxia. Shortness of breath with fever over the past 5 days. History of lymphoma. EXAM: CT CHEST WITH CONTRAST TECHNIQUE: Multidetector CT imaging of the chest was performed during intravenous contrast administration. CONTRAST:  13m ISOVUE-300 IOPAMIDOL (ISOVUE-300) INJECTION 61% COMPARISON:  03/15/2017 PET-CT and 03/20/2016 PET-CT. FINDINGS: Cardiovascular: No significant vascular findings. Normal heart size. No pericardial effusion. Right port catheter tip extends to the cavoatrial junction. Mediastinum/Nodes: Progression of mediastinal lymph nodes are enlarged when compared with the prior PET-CT, the largest in the right hilum measuring 16 x 12 mm (series 2, image 63). Severe right axillary lymphadenopathy with nodes measuring up to 4.8 x 2.4 cm (series 2, image 59). Lungs/Pleura: Numerous pulmonary nodules throughout the lungs are rounded masslike and likely represent pulmonary metastatic disease. More confluent consolidation throughout the left lower lobe and small left effusion is more consistent with pneumonia. No pneumothorax. Upper Abdomen: Stable liver cysts within the left lobe of the liver. Additional soft tissue attenuating lesions near the IVC and in the right lateral lobe of the liver are stable from prior PET-CTs where they did not demonstrate FDG uptake, likely benign. Musculoskeletal: No chest wall abnormality. No acute or significant osseous findings. IMPRESSION: 1. Progression of mediastinal adenopathy with large bulky lymph nodes in the right axilla measuring up to 4.8 cm. Findings likely represent progression of lymphoma.  2. Numerous masslike pulmonary nodules throughout the lungs, likely metastatic. 3. Confluent consolidation in the left lower lobe and small left effusion, likely superimposed pneumonia. Electronically Signed   By: LKristine GarbeM.D.   On: 09/20/2017 05:51

## 2017-09-20 NOTE — ED Notes (Signed)
ED TO INPATIENT HANDOFF REPORT  Name/Age/Gender [name] 57 y.o. male  Code Status Code Status History    Date Active Date Inactive Code Status Order ID Comments User Context   05/18/2016 1131 05/18/2016 1158 Full Code [id number]  [name], NP Inpatient   04/13/2016 1119 04/17/2016 1541 Full Code [id number]  [name], NP Inpatient   03/22/2016 8786 03/22/2016 1213 Full Code [id number]  [name], NP Inpatient      Home/SNF/Other Home  Chief Complaint shortness of breath  Level of Care/Admitting Diagnosis ED Disposition    ED Disposition Condition Comment   Admit  Hospital Area: [facility] [[id number]]  Level of Care: Telemetry [5]  Admit to tele based on following criteria: Other see comments  Comments: acute hypoxia  Diagnosis: Acute respiratory failure with hypoxia [facility] [place]) [[id number]]  Admitting Physician: RAIVernelle Emerald [[id number]]  Attending Physician: RAI, RIPUDEEP K [[id number]]  Estimated length of stay: past midnight tomorrow  Certification:: I certify this patient will need inpatient services for at least 2 midnights  PT Class (Do Not Modify): Inpatient [101]  PT Acc Code (Do Not Modify): Private [1]       Medical History Past Medical History:  Diagnosis Date  . History of radiation therapy 12/18/10 to 01/22/11   left oropharynx  . Hypertension   . Neutropenia    Secondary to Rituxan  . Non Hodgkin's lymphoma ([place])    s/p 4 cycles R-ICE, start 11/21/10  . T-cell lymphoma ([place]) 03/14/2016    Allergies Allergies  Allergen Reactions  . Penicillins Other (See Comments)    Childhood allergy reaction unknown  Has patient had a PCN reaction causing immediate rash, facial/tongue/throat swelling, SOB or lightheadedness with hypotension: Unknown Has patient had a PCN reaction causing severe rash involving mucus membranes or skin necrosis: Unknown Has patient had a PCN reaction that required hospitalization: Unknown Has patient had a PCN reaction  occurring within the last 10 years: Unknown If all of the above answers are "NO", then may proceed with Cephalosporin use.     IV Location/Drains/Wounds Patient Lines/Drains/Airways Status   Active Line/Drains/Airways    Name:   Placement date:   Placement time:   Site:   Days:   Implanted Port 09/19/10 Right Chest   09/19/10    -    Chest   2558   Implanted Port 03/21/16 Right Chest   03/21/16    1412    Chest   548   Incision 10/30/12 Leg Left;Posterior   10/30/12    1548     1786   Incision (Closed) 03/22/17 Flank Left;Posterior   03/22/17    1236     182          Labs/Imaging Results for orders placed or performed during the hospital encounter of 09/20/17 (from the past 48 hour(s))  Comprehensive metabolic panel     Status: Abnormal   Collection Time: 09/20/17  3:00 AM  Result Value Ref Range   Sodium 142 135 - 145 mmol/L   Potassium 3.9 3.5 - 5.1 mmol/L   Chloride 106 98 - 111 mmol/L   CO2 26 22 - 32 mmol/L   Glucose, Bld 143 (H) 70 - 99 mg/dL   BUN 15 6 - 20 mg/dL   Creatinine, Ser 0.88 0.61 - 1.24 mg/dL   Calcium 8.9 8.9 - 10.3 mg/dL   Total Protein 6.0 (L) 6.5 - 8.1 g/dL   Albumin 3.4 (L) 3.5 - 5.0 g/dL  AST 27 15 - 41 U/L   ALT 14 0 - 44 U/L   Alkaline Phosphatase 38 38 - 126 U/L   Total Bilirubin 0.9 0.3 - 1.2 mg/dL   GFR calc non Af Amer >60 >60 mL/min   GFR calc Af Amer >60 >60 mL/min    Comment: (NOTE) The eGFR has been calculated using the CKD EPI equation. This calculation has not been validated in all clinical situations. eGFR's persistently <60 mL/min signify possible Chronic Kidney Disease.    Anion gap 10 5 - 15    Comment: Performed at [facility], [place] [address] [place], [place] [address]  CBC with Differential     Status: Abnormal   Collection Time: 09/20/17  3:00 AM  Result Value Ref Range   WBC 10.9 (H) 4.0 - 10.5 K/uL   RBC 3.72 (L) 4.22 - 5.81 MIL/uL   Hemoglobin 12.3 (L) 13.0 - 17.0 g/dL   HCT 36.7 (L) 39.0 - 52.0 %    MCV 98.7 78.0 - 100.0 fL   MCH 33.1 26.0 - 34.0 pg   MCHC 33.5 30.0 - 36.0 g/dL   RDW 15.2 11.5 - 15.5 %   Platelets 127 (L) 150 - 400 K/uL   Neutrophils Relative % 88 %   Neutro Abs 9.5 (H) 1.7 - 7.7 K/uL   Lymphocytes Relative 6 %   Lymphs Abs 0.6 (L) 0.7 - 4.0 K/uL   Monocytes Relative 6 %   Monocytes Absolute 0.7 0.1 - 1.0 K/uL   Eosinophils Relative 0 %   Eosinophils Absolute 0.0 0.0 - 0.7 K/uL   Basophils Relative 0 %   Basophils Absolute 0.0 0.0 - 0.1 K/uL    Comment: Performed at [facility], [place] [address] [place], [place] [address]  I-Stat CG4 Lactic Acid, ED  (not at  [facility])     Status: None   Collection Time: 09/20/17  3:29 AM  Result Value Ref Range   Lactic Acid, Venous 1.17 0.5 - 1.9 mmol/L  Blood gas, venous     Status: Abnormal   Collection Time: 09/20/17  5:43 AM  Result Value Ref Range   O2 Content 5.0 L/min   Delivery systems NASAL CANNULA    pH, Ven 7.365 7.250 - 7.430   pCO2, Ven 40.4 (L) 44.0 - 60.0 mmHg   pO2, Ven 42.6 32.0 - 45.0 mmHg   Bicarbonate 22.5 20.0 - 28.0 mmol/L   Acid-base deficit 2.1 (H) 0.0 - 2.0 mmol/L   O2 Saturation 68.0 %   Patient temperature 98.6    Collection site VEIN    Drawn by [phone]    Sample type VENOUS     Comment: Performed at [facility], [place] [address], [place], [place] [address]   Dg Chest 2 View  Result Date: 09/19/2017 CLINICAL DATA:  Cough for 4 days, short of breath and wheezing today, fever, history of T-cell lymphoma EXAM: CHEST - 2 VIEW COMPARISON:  Chest x-ray of 02/08/2017 and PET-CT of 03/15/2016 FINDINGS: There are now multiple nodular opacities scattered throughout both lungs. This could represent inflammatory or infectious process, but of fullness involvement of the lungs cannot be excluded. CT of the chest is recommended to evaluate further. No pleural effusion is seen. Mediastinal and hilar contours are unremarkable. Right-sided Port-A-Cath tip is seen to the lower  SVC. The heart is within normal limits in size. There are degenerative changes throughout the thoracic spine. IMPRESSION: Interval development of nodular opacities throughout both  lungs. Possible inflammatory or infectious process but cannot exclude metastatic or lymphomatous lesions. Recommend CT of the chest. Electronically Signed   By: Ivar Drape M.D.   On: 09/19/2017 12:06   Ct Chest W Contrast  Result Date: 09/20/2017 CLINICAL DATA:  57 y/o M; dyspnea and hypoxia. Shortness of breath with fever over the past 5 days. History of lymphoma. EXAM: CT CHEST WITH CONTRAST TECHNIQUE: Multidetector CT imaging of the chest was performed during intravenous contrast administration. CONTRAST:  130m ISOVUE-300 IOPAMIDOL (ISOVUE-300) INJECTION 61% COMPARISON:  03/15/2017 PET-CT and 03/20/2016 PET-CT. FINDINGS: Cardiovascular: No significant vascular findings. Normal heart size. No pericardial effusion. Right port catheter tip extends to the cavoatrial junction. Mediastinum/Nodes: Progression of mediastinal lymph nodes are enlarged when compared with the prior PET-CT, the largest in the right hilum measuring 16 x 12 mm (series 2, image 63). Severe right axillary lymphadenopathy with nodes measuring up to 4.8 x 2.4 cm (series 2, image 59). Lungs/Pleura: Numerous pulmonary nodules throughout the lungs are rounded masslike and likely represent pulmonary metastatic disease. More confluent consolidation throughout the left lower lobe and small left effusion is more consistent with pneumonia. No pneumothorax. Upper Abdomen: Stable liver cysts within the left lobe of the liver. Additional soft tissue attenuating lesions near the IVC and in the right lateral lobe of the liver are stable from prior PET-CTs where they did not demonstrate FDG uptake, likely benign. Musculoskeletal: No chest wall abnormality. No acute or significant osseous findings. IMPRESSION: 1. Progression of mediastinal adenopathy with large bulky lymph nodes  in the right axilla measuring up to 4.8 cm. Findings likely represent progression of lymphoma. 2. Numerous masslike pulmonary nodules throughout the lungs, likely metastatic. 3. Confluent consolidation in the left lower lobe and small left effusion, likely superimposed pneumonia. Electronically Signed   By: LKristine GarbeM.D.   On: 09/20/2017 05:51    Pending Labs Unresulted Labs (From admission, onward)    Start     Ordered   09/20/17 0320  Urinalysis, Routine w reflex microscopic  STAT,   STAT     09/20/17 0320   09/20/17 0300  Culture, blood (routine x 2)  BLOOD CULTURE X 2,   STAT     09/20/17 0259   Signed and Held  Culture, blood (routine x 2) Call MD if unable to obtain prior to antibiotics being given  BLOOD CULTURE X 2,   R    Comments:  If blood cultures drawn in Emergency Department - Do not draw and cancel order    Signed and Held   Signed and Held  Culture, sputum-assessment  Once,   R     Signed and Held   Signed and Held  Gram stain  Once,   R     Signed and Held   Signed and Held  HIV antibody (Routine Screening)  Once,   R     Signed and Held   SVisual merchandiserand Held  Urine culture  Once,   R     Signed and Held   Signed and Held  Strep pneumoniae urinary antigen  Once,   R     Signed and Held   SVisual merchandiserand HOccupational hygienistmorning,   R     Signed and Held   SVisual merchandiserand Held  CBC  Tomorrow morning,   R     Signed and Held   Signed and Held  Legionella Pneumophila Serogp 1 Ur Ag  Once,  R     Signed and Held          Vitals/Pain Today's Vitals   09/20/17 0645 09/20/17 0700 09/20/17 0715 09/20/17 0730  BP: 121/68 112/76 109/62 105/67  Pulse: 92 89 87 82  Resp: (!) _0 Temp:      TempSrc:      SpO2: 94% 92% 94% 94%  PainSc:        Isolation Precautions No active isolations  Medications Medications  0.9 %  sodium chloride infusion (has no administration in time range)  vancomycin (VANCOCIN) 1,750 mg in sodium chloride  0.9 % 500 mL IVPB (has no administration in time range)  albuterol (PROVENTIL) (2.5 MG/3ML) 0.083% nebulizer solution 5 mg (5 mg Nebulization Given 09/20/17 0259)  levofloxacin (LEVAQUIN) IVPB 750 mg (0 mg Intravenous Stopped 09/20/17 0513)  sodium chloride 0.9 % bolus 1,000 mL (0 mLs Intravenous Stopped 09/20/17 0440)    And  sodium chloride 0.9 % bolus 1,000 mL (1,000 mLs Intravenous New Bag/Given 09/20/17 0441)  acetaminophen (TYLENOL) tablet 1,000 mg (1,000 mg Oral Given 09/20/17 0406)  albuterol (PROVENTIL) (2.5 MG/3ML) 0.083% nebulizer solution 5 mg (5 mg Nebulization Given 09/20/17 0410)  iopamidol (ISOVUE-300) 61 % injection (100 mLs  Contrast Given 09/20/17 0511)    Mobility walks with person assist

## 2017-09-20 NOTE — Evaluation (Signed)
Physical Therapy Evaluation Patient Details Name: [name] MRN: [id number] DOB: 1960/10/26 Today's Date: 09/20/2017   History of Present Illness  Pt is a 57 YO male admitted 8/30 for acute respiratory failure, symptoms including fevers, SOB, wheezing. Pt with nonhodgkins lymphoma dx in 2018. PMh includes essential tremor, diffuse large B cell lymphoma diagnosed 2005.   Clinical Impression   Pt with difficulty maintaining O2sats with ambulation and decreased activity tolerance. Pt would benefit from progressive mobility with PT during acute stay. Pt with O2sats between 88-94% on 6 L O2 via [place] while ambulating, requiring multiple standing rest breaks to recover O2s and symptoms of dyspnea. Pt educated on importance of mobility with PNA or with any hospitalization. Will continue to follow acutely.     Follow Up Recommendations No PT follow up    Equipment Recommendations  None recommended by PT    Recommendations for Other Services       Precautions / Restrictions Precautions Precautions: None      Mobility  Bed Mobility Overal bed mobility: Modified Independent             General bed mobility comments: increased time and effort   Transfers Overall transfer level: Independent                  Ambulation/Gait Ambulation/Gait assistance: Supervision Gait Distance (Feet): 400 Feet Assistive device: IV Pole Gait Pattern/deviations: Step-through pattern;Decreased stride length Gait velocity: normal    General Gait Details: standing rest breaks x3 for symptomatic dyspnea. Pt O2sats ranged from 88-94% on 6LO2 during ambulation.   Stairs            Wheelchair Mobility    Modified Rankin (Stroke Patients Only)       Balance Overall balance assessment: Modified Independent                                           Pertinent Vitals/Pain Pain Assessment: 0-10 Pain Score: 3  Pain Location: chest, abs Pain Descriptors / Indicators:  Aching;Sore Pain Intervention(s): Limited activity within patient's tolerance;Repositioned;Monitored during session    [place] expects to be discharged to:: Private residence Living Arrangements: Spouse/significant other Available Help at Discharge: Family;Available PRN/intermittently Type of Home: House Home Access: Level entry     Home Layout: Two level Home Equipment: None      Prior Function Level of Independence: Independent               Hand Dominance   Dominant Hand: Right    Extremity/Trunk Assessment   Upper Extremity Assessment Upper Extremity Assessment: Overall WFL for tasks assessed    Lower Extremity Assessment Lower Extremity Assessment: Overall WFL for tasks assessed    Cervical / Trunk Assessment Cervical / Trunk Assessment: Normal  Communication   Communication: No difficulties  Cognition Arousal/Alertness: Awake/alert Behavior During Therapy: WFL for tasks assessed/performed Overall Cognitive Status: Within Functional Limits for tasks assessed                                        General Comments      Exercises     Assessment/Plan    PT Assessment Patient needs continued PT services  PT Problem List Pain;Decreased activity tolerance       PT Treatment Interventions Gait  training;Patient/family education;Functional mobility training    PT Goals (Current goals can be found in the Care Plan section)  Acute Rehab PT Goals PT Goal Formulation: With patient Time For Goal Achievement: 10/04/17 Potential to Achieve Goals: Good    Frequency Min 3X/week   Barriers to discharge        Co-evaluation               AM-PAC PT "6 Clicks" Daily Activity  Outcome Measure Difficulty turning over in bed (including adjusting bedclothes, sheets and blankets)?: None Difficulty moving from lying on back to sitting on the side of the bed? : None Difficulty sitting down on and standing up from a  chair with arms (e.g., wheelchair, bedside commode, etc,.)?: None Help needed moving to and from a bed to chair (including a wheelchair)?: None Help needed walking in hospital room?: A Little Help needed climbing 3-5 steps with a railing? : A Little 6 Click Score: 22    End of Session Equipment Utilized During Treatment: Oxygen(6 LO2 for ambulation ) Activity Tolerance: Patient tolerated treatment well;Patient limited by fatigue Patient left: in chair;with family/visitor present;with call bell/phone within reach Nurse Communication: Mobility status;Other (comment)(O2 status during session) PT Visit Diagnosis: Difficulty in walking, not elsewhere classified (R26.2)    Time: 0488-8916 PT Time Calculation (min) (ACUTE ONLY): 27 min   Charges:   PT Evaluation $PT Eval Low Complexity: 1 Low PT Treatments $Gait Training: 8-22 mins        Sukhmani Fetherolf Conception [name], PT, DPT  Pager # [phone]   Karys Meckley D Geral Tuch 09/20/2017, 6:27 PM

## 2017-09-20 NOTE — H&P (Signed)
History and Physical        Hospital Admission Note Date: 09/20/2017  Patient name: [name] Medical record number: [id number] Date of birth: 1960-02-13 Age: 57 y.o. Gender: male  PCP: [name], MD    Patient coming from: Home  I have reviewed all records in the [facility].    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 Learta Codding and also just finished clinical trial at [facility] 2 weeks ago on 09/02/2017 presented with above symptoms.  Patient reported that he developed fevers 101.5 F, chills, cough, nonproductive on Monday, 8/26.  He contacted [facility] and was seen on 8/27, his CBC did not show neutropenia.  Since patient had a planned trip on the weekend, he was given the prescription for Levaquin to fill if he was feeling worse.  Patient reported that he continued to progressively get worse, started having dyspnea on exertion since yesterday with fevers, dry cough, congestion and started Levaquin yesterday.  His daughter who is a Marine scientist, checked his O2 sats and was in 74s.  Last night around 10 PM, patient started getting more short of breath with his chest rattling and wheezing, felt tight, due to ED for further work-up.   In triage, pulse ox was 79% on room air and was placed on O2 via nasal cannula, pulse ox improved to 91%   ED work-up/course:  Temp 98.9, pulse 103, respiratory rate 33, BP 147/100.  O2 sats 91% on 5 L. He was given albuterol nebulizer treatment, was briefly placed on nonrebreather mask.  At the time of my examination, patient feels significantly improved from the time of triage, O2 sats 96% on 4 L. ABG showed  pH of 7.3, PCO2 40.4 (venous) CMET unremarkable, WBCs 10.9, hemoglobin 12.3, hematocrit 36.7  Review of Systems: Positives marked in 'bold' Constitutional: + fever, chills, diaphoresis, poor appetite and fatigue.  HEENT: Denies photophobia, eye pain, redness, hearing loss, ear pain, congestion, sore throat, rhinorrhea, sneezing, mouth sores, trouble swallowing, neck pain, neck stiffness and tinnitus.   Respiratory: Please see HPI   Cardiovascular: Denies chest pain, palpitations and leg swelling.  Gastrointestinal: Denies nausea, vomiting, abdominal pain, diarrhea, constipation, blood in stool and abdominal distention.  Genitourinary: Denies dysuria, urgency, frequency, hematuria, flank pain and difficulty urinating.  Musculoskeletal: Denies myalgias, back pain, joint swelling, arthralgias and gait problem.  Skin: Denies pallor, rash and wound.  Neurological: Denies dizziness, seizures, syncope, light-headedness, numbness and headaches. + Generalized weakness Hematological: Denies adenopathy. Easy bruising, personal or family bleeding history  Psychiatric/Behavioral: Denies suicidal ideation, mood changes, confusion, nervousness, sleep disturbance and agitation  Past Medical History: Past Medical History:  Diagnosis Date  . History of radiation therapy 12/18/10 to 01/22/11   left oropharynx  . Hypertension   . Neutropenia    Secondary to Rituxan  . Non Hodgkin's lymphoma ([place])    s/p 4 cycles R-ICE, start 11/21/10  . 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 BIOPSY PHARYNX     09/20/10 excision right posterior pharynx  . BONE MARROW ASPIRATION      09/08/10 Biopsy , and clot, left iliac crest  . IR GENERIC HISTORICAL  03/21/2016   IR FLUORO GUIDE PORT INSERTION RIGHT 03/21/2016 Arne Cleveland, MD [facility]  . IR GENERIC HISTORICAL  03/21/2016   IR US GUIDE VASC ACCESS RIGHT 03/21/2016 Arne Cleveland, MD [facility]  . PORTACATH  PLACEMENT  09/19/10  . TONSILLECTOMY     Bilateral    Medications: Prior to Admission medications   Medication Sig Start Date End Date Taking? Authorizing Provider  atenolol (TENORMIN) 50 MG tablet Take 50 mg by mouth daily.  08/19/14  Yes [provider]  cetirizine (ZYRTEC) 10 MG tablet Take 10 mg by mouth daily as needed for allergies.   Yes [provider]  chlorpheniramine-HYDROcodone (TUSSIONEX PENNKINETIC ER) 10-8 MG/5ML SUER Take 5 mLs by mouth every 12 (twelve) hours as needed for cough. 09/19/17  Yes Tanner, Lyndon Code., PA-C  levofloxacin (LEVAQUIN) 500 MG tablet Take 500 mg by mouth daily. Pt to take one a day for 7 days. Dr. Annice Needy.   Yes [provider]  lidocaine-prilocaine (EMLA) cream Apply 1 application topically as needed. Apply one hour prior to port access and cover with plastic wrap 03/29/16  Yes Ladell Pier, MD  LORazepam (ATIVAN) 0.5 MG tablet TAKE 1 OR 2 TABLETS AT BEDTIME AS NEEDED FOR SLEEP. Patient taking differently: Take 0.5-1 mg by mouth at bedtime as needed for sleep.  08/15/17  Yes Ladell Pier, MD  ondansetron (ZOFRAN) 8 MG tablet Take 1 tablet (8 mg total) by mouth every 6 (six) hours as needed for nausea. 03/27/16  Yes Ladell Pier, MD  oxyCODONE-acetaminophen (PERCOCET/ROXICET) 5-325 MG tablet Take 1-2 tablets by mouth every 4 (four) hours as needed for severe pain. Up to 8 tabs daily. 04/03/17  Yes Ladell Pier, MD  prochlorperazine (COMPAZINE) 10 MG tablet Take 1 tablet (10 mg total) by mouth every 6 (six) hours as needed for nausea or vomiting. 03/29/16  Yes Ladell Pier, MD  XARELTO 20 MG TABS tablet TAKE 1 TABLET DAILY WITH SUPPER. Patient taking differently: Take 20 mg by mouth daily.  06/03/17  Yes Ladell Pier, MD    Allergies:   Allergies  Allergen Reactions  . Penicillins Other (See Comments)    Childhood allergy reaction unknown  Has patient had a PCN reaction causing immediate rash,  facial/tongue/throat swelling, SOB or lightheadedness with hypotension: Unknown Has patient had a PCN reaction causing severe rash involving mucus membranes or skin necrosis: Unknown Has patient had a PCN reaction that required hospitalization: Unknown Has patient had a PCN reaction occurring within the last 10 years: Unknown If all of the above answers are "NO", then may proceed with Cephalosporin use.     Social History:  reports that he quit smoking about 26 years ago. His smoking use included cigarettes. He has a 2.50 pack-year smoking history. He has never used smokeless tobacco. He reports that he does not drink alcohol or use drugs.  Family History: No family history of malignancy.  Patient reports no family history of diabetes or hypertension    Physical Exam: Blood pressure 105/67, pulse 82, temperature 98.4 F (36.9 C), temperature source Oral, resp. rate 17, SpO2 94 %. General: Alert, awake, oriented x3, in no acute distress. Eyes: pink conjunctiva,anicteric sclera, pupils equal and reactive to light and accomodation, HEENT:  normocephalic, atraumatic, oropharynx clear Neck: supple, no masses or lymphadenopathy, no goiter, no bruits, no JVD CVS: Regular rate and rhythm, without murmurs, rubs or gallops. No lower extremity edema Resp : Diffuse rhonchi bilaterally                                 GI : Soft, nontender, nondistended, positive bowel sounds, no masses. No hepatomegaly. No hernia.  Musculoskeletal: No clubbing or cyanosis, positive pedal pulses. No contracture. ROM intact  Neuro: Grossly intact, no focal neurological deficits, strength 5/5 upper and lower extremities bilaterally Psych: alert and oriented x 3, normal mood and affect Skin: no rashes or lesions, warm and dry   LABS on Admission: I have personally reviewed all the labs and imagings below    Basic Metabolic Panel: Recent Labs  Lab 09/19/17 0911 09/20/17 0300  NA 140 142  K 4.2 3.9  CL 105 106    CO2 25 26  GLUCOSE 108* 143*  BUN 11 15  CREATININE 0.80 0.88  CALCIUM 9.1 8.9   Liver Function Tests: Recent Labs  Lab 09/19/17 0911 09/20/17 0300  AST 25 27  ALT 10 14  ALKPHOS 43 38  BILITOT 0.6 0.9  PROT 6.2* 6.0*  ALBUMIN 3.3* 3.4*   No results for input(s): LIPASE, AMYLASE in the last 168 hours. No results for input(s): AMMONIA in the last 168 hours. CBC: Recent Labs  Lab 09/19/17 0911 09/20/17 0300  WBC 9.8 10.9*  NEUTROABS 8.6* 9.5*  HGB 12.4* 12.3*  HCT 36.2* 36.7*  MCV 98.4* 98.7  PLT 131* 127*   Cardiac Enzymes: No results for input(s): CKTOTAL, CKMB, CKMBINDEX, TROPONINI in the last 168 hours. BNP: Invalid input(s): POCBNP CBG: No results for input(s): GLUCAP in the last 168 hours.  Radiological Exams on Admission:  Dg Chest 2 View  Result Date: 09/19/2017 CLINICAL DATA:  Cough for 4 days, short of breath and wheezing today, fever, history of T-cell lymphoma EXAM: CHEST - 2 VIEW COMPARISON:  Chest x-ray of 02/08/2017 and PET-CT of 03/15/2016 FINDINGS: There are now multiple nodular opacities scattered throughout both lungs. This could represent inflammatory or infectious process, but of fullness involvement of the lungs cannot be excluded. CT of the chest is recommended to evaluate further. No pleural effusion is seen. Mediastinal and hilar contours are unremarkable. Right-sided Port-A-Cath tip is seen to the lower SVC. The heart is within normal limits in size. There are degenerative changes throughout the thoracic spine. IMPRESSION: Interval development of nodular opacities throughout both lungs. Possible inflammatory or infectious process but cannot exclude metastatic or lymphomatous lesions. Recommend CT of the chest. Electronically Signed   By: Ivar Drape M.D.   On: 09/19/2017 12:06   Ct Chest W Contrast  Result Date: 09/20/2017 CLINICAL DATA:  57 y/o M; dyspnea and hypoxia. Shortness of breath with fever over the past 5 days. History of lymphoma.  EXAM: CT CHEST WITH CONTRAST TECHNIQUE: Multidetector CT imaging of the chest was performed during intravenous contrast administration. CONTRAST:  146m ISOVUE-300 IOPAMIDOL (ISOVUE-300) INJECTION 61% COMPARISON:  03/15/2017 PET-CT and 03/20/2016 PET-CT. FINDINGS: Cardiovascular: No significant vascular findings. Normal heart size. No pericardial effusion. Right port catheter tip extends to the cavoatrial junction. Mediastinum/Nodes: Progression of mediastinal lymph nodes are enlarged when compared with the prior PET-CT, the largest in the right hilum measuring 16 x 12 mm (series 2, image 63). Severe right axillary lymphadenopathy with nodes measuring up to  4.8 x 2.4 cm (series 2, image 59). Lungs/Pleura: Numerous pulmonary nodules throughout the lungs are rounded masslike and likely represent pulmonary metastatic disease. More confluent consolidation throughout the left lower lobe and small left effusion is more consistent with pneumonia. No pneumothorax. Upper Abdomen: Stable liver cysts within the left lobe of the liver. Additional soft tissue attenuating lesions near the IVC and in the right lateral lobe of the liver are stable from prior PET-CTs where they did not demonstrate FDG uptake, likely benign. Musculoskeletal: No chest wall abnormality. No acute or significant osseous findings. IMPRESSION: 1. Progression of mediastinal adenopathy with large bulky lymph nodes in the right axilla measuring up to 4.8 cm. Findings likely represent progression of lymphoma. 2. Numerous masslike pulmonary nodules throughout the lungs, likely metastatic. 3. Confluent consolidation in the left lower lobe and small left effusion, likely superimposed pneumonia. Electronically Signed   By: Kristine Garbe M.D.   On: 09/20/2017 05:51      EKG: Independently reviewed, rate 102 sinus tachycardia    Assessment/Plan Principal Problem:   Acute respiratory failure with hypoxia (HCC) secondary to HCAP  (healthcare-associated pneumonia -Currently improving, O2 sats 96% on 4 L, reviewed CT chest, showed consolidation in the left lower lobe and small left effusion likely superimposed pneumonia.  No PE, progression of mediastinal adenopathy, likely progression of lymphoma with numerous likely metastatic pulmonary nodules -Obtain blood cultures, sputum cultures, urine Legionella antigen, urine strep antigen, placed on IV vancomycin and cefepime (on Levaquin PTA and just finished clinical trial chemo at [facility] on scheduled duonebs, flutter valve, Tussionex, prednisone 40 mg daily for 5 days for wheezing -Will likely need home O2 evaluation prior to DC.  Active Problems:   Anemia, likely due to underlying malignancy -Hemoglobin 12.3, close to his baseline of 12-13.     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 Learta Codding will follow  History of superficial vein thrombosis left lower extremity in 3/18 -Per patient he has been on Xarelto since then, will continue  DVT prophylaxis: Xarelto  CODE STATUS: Full CODE STATUS  Consults called: Oncology Family Communication: Admission, patients condition and plan of care including tests being ordered have been discussed with the patient and wife  who indicates understanding and agree with the plan and Code Status  Admission status: Inpatient, telemetry  Disposition plan: Further plan will depend as patient's clinical course evolves and further radiologic and laboratory data become available.    At the time of admission, it appears that the appropriate admission status for this patient is INPATIENT . This is judged to be reasonable and necessary in order to provide the required intensity of service to ensure the patient's safety given the presenting symptoms acute respiratory failure with  hypoxia, pneumonia, physical exam findings, and initial radiographic and laboratory data in the context of their chronic comorbidities.  The medical decision making on this patient was of high complexity and the patient is at high risk for clinical deterioration, therefore this is a level 3 visit.   Time Spent on Admission: 41mns     Ripudeep Rai M.D. [facility] 09/20/2017, 7:51 AM Pager: [phone] If 7PM-7AM, please contact night-coverage [email or web address] Password [email or web address]

## 2017-09-20 NOTE — ED Provider Notes (Signed)
[facility] DEPT Provider Note   CSN: [id number] Arrival date & time: 09/20/17  0238  Time seen 02:50 AM   History   Chief Complaint Chief Complaint  Patient presents with  . Shortness of Breath    HPI [name] is a 57 y.o. male.  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  He reports on August 26 he started getting fever in the evening up to 101.5.  He was seen at [facility] on Tuesday and had blood work done and he was not neutropenic.  They gave him a prescription for Levaquin but told him not to get it filled unless he got worse.  He reports he started getting a cough and shortness of breath later that day.  The cough is dry.  He initially started having dyspnea on exertion but now it has become constant.  He was seen earlier today, August 29 at the cancer center at [facility] and had a chest x-ray done.  He was told it showed inflammation or metastatic disease.  They instructed him to start the Sesser he had been prescribed at [facility].  He states he also was noted to have a low oxygen today and his pulse ox was 91% and dropped to 88% with exertion.  He states around 10 PM he started getting more short of breath and his daughter brought home a pulse ox monitor and it had dropped into the 70s.  He also has noted some rattling or wheezing in his chest that started about 2 days ago.  He states his chest feels tight and when he takes a big deep breath it makes him cough.  But he denies chest pain.  He denies rhinorrhea, sneezing, sore throat, nausea, vomiting, or diarrhea.  He is to return in the morning to get a CT scan. He denies history of reactive airway disease.  PCP [name], MD Oncology Dr [name]  Past Medical History:  Diagnosis Date  . History of radiation therapy  12/18/10 to 01/22/11   left oropharynx  . Hypertension   . Neutropenia    Secondary to Rituxan  . Non Hodgkin's lymphoma ([place])    s/p 4 cycles R-ICE, start 11/21/10  . 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 Port catheter in place 04/03/2016  . Hyperglycemia 03/24/2016  . 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 Schwannoma 10/28/2012  . History of radiation therapy   . Anemia, unspecified 11/30/2010  . Non Hodgkin's lymphoma ([place]) 11/30/2010    Past Surgical History:  Procedure Laterality Date  . BIOPSY PHARYNX     09/20/10 excision right posterior pharynx  . BONE MARROW ASPIRATION      09/08/10 Biopsy , and clot, left iliac crest  . IR GENERIC HISTORICAL  03/21/2016   IR FLUORO GUIDE PORT INSERTION RIGHT 03/21/2016 Arne Cleveland, MD [facility]INTER[facility] RAD  . IR GENERIC HISTORICAL  03/21/2016   IR US GUIDE VASC ACCESS RIGHT 03/21/2016 Arne Cleveland, MD [facility]INTER[facility]  . PORTACATH PLACEMENT  09/19/10  . TONSILLECTOMY     Bilateral        Home Medications    Prior to Admission medications   Medication Sig Start Date End Date Taking? Authorizing Provider  atenolol (TENORMIN) 50 MG tablet Take 50 mg by  mouth daily.  08/19/14  Yes [provider]  cetirizine (ZYRTEC) 10 MG tablet Take 10 mg by mouth daily as needed for allergies.   Yes [provider]  chlorpheniramine-HYDROcodone (TUSSIONEX PENNKINETIC ER) 10-8 MG/5ML SUER Take 5 mLs by mouth every 12 (twelve) hours as needed for cough. 09/19/17  Yes Tanner, Lyndon Code., PA-C  levofloxacin (LEVAQUIN) 500 MG tablet Take 500 mg by mouth daily. Pt to take one a day for 7 days. Dr. Annice Needy.   Yes [provider]  lidocaine-prilocaine (EMLA) cream Apply 1 application topically as needed. Apply one hour prior to port access and cover with plastic wrap 03/29/16  Yes Ladell Pier, MD  LORazepam (ATIVAN) 0.5 MG tablet TAKE 1 OR 2 TABLETS AT BEDTIME AS NEEDED FOR SLEEP. Patient  taking differently: Take 0.5-1 mg by mouth at bedtime as needed for sleep.  08/15/17  Yes Ladell Pier, MD  ondansetron (ZOFRAN) 8 MG tablet Take 1 tablet (8 mg total) by mouth every 6 (six) hours as needed for nausea. 03/27/16  Yes Ladell Pier, MD  oxyCODONE-acetaminophen (PERCOCET/ROXICET) 5-325 MG tablet Take 1-2 tablets by mouth every 4 (four) hours as needed for severe pain. Up to 8 tabs daily. 04/03/17  Yes Ladell Pier, MD  prochlorperazine (COMPAZINE) 10 MG tablet Take 1 tablet (10 mg total) by mouth every 6 (six) hours as needed for nausea or vomiting. 03/29/16  Yes Ladell Pier, MD  XARELTO 20 MG TABS tablet TAKE 1 TABLET DAILY WITH SUPPER. Patient taking differently: Take 20 mg by mouth daily.  06/03/17  Yes Ladell Pier, MD    Family History No family history on file.  Social History Social History   Tobacco Use  . Smoking status: Former Smoker    Packs/day: 0.50    Years: 5.00    Pack years: 2.50    Types: Cigarettes    Last attempt to quit: 11/30/1990    Years since quitting: 26.8  . Smokeless tobacco: Never Used  Substance Use Topics  . Alcohol use: No  . Drug use: No  employed Lives with spouse Drinks a few beers daily Denies cigarettes   Allergies   Penicillins   Review of Systems Review of Systems  All other systems reviewed and are negative.    Physical Exam Updated Vital Signs BP (!) 147/100   Pulse (!) 103   Temp 98.9 F (37.2 C)   Resp 19   SpO2 91%   Vital signs normal except tachycardia and borderline hypoxia   Physical Exam  Constitutional: He is oriented to person, place, and time. He appears well-developed and well-nourished.  Non-toxic appearance. He does not appear ill. No distress.  HENT:  Head: Normocephalic and atraumatic.  Right Ear: External ear normal.  Left Ear: External ear normal.  Nose: Nose normal. No mucosal edema or rhinorrhea.  Mouth/Throat: Oropharynx is clear and moist and mucous membranes are  normal. No dental abscesses or uvula swelling.  Eyes: Pupils are equal, round, and reactive to light. Conjunctivae and EOM are normal.  Neck: Normal range of motion and full passive range of motion without pain. Neck supple.  Cardiovascular: Normal rate, regular rhythm and normal heart sounds. Exam reveals no gallop and no friction rub.  No murmur heard. Pulmonary/Chest: Tachypnea noted. He is in respiratory distress. He has decreased breath sounds. He has no wheezes. He has rhonchi. He has no rales. He exhibits no tenderness and no crepitus.  Abdominal: Soft. Normal appearance  and bowel sounds are normal. He exhibits no distension. There is no tenderness. There is no rebound and no guarding.  Musculoskeletal: Normal range of motion. He exhibits no edema or tenderness.  Moves all extremities well.   Neurological: He is alert and oriented to person, place, and time. He has normal strength. No cranial nerve deficit.  Skin: Skin is warm, dry and intact. No rash noted. No erythema. No pallor.  Psychiatric: He has a normal mood and affect. His speech is normal and behavior is normal. His mood appears not anxious.  Nursing note and vitals reviewed.    ED Treatments / Results  Labs (all labs ordered are listed, but only abnormal results are displayed) Results for orders placed or performed during the hospital encounter of 09/20/17  Comprehensive metabolic panel  Result Value Ref Range   Sodium 142 135 - 145 mmol/L   Potassium 3.9 3.5 - 5.1 mmol/L   Chloride 106 98 - 111 mmol/L   CO2 26 22 - 32 mmol/L   Glucose, Bld 143 (H) 70 - 99 mg/dL   BUN 15 6 - 20 mg/dL   Creatinine, Ser 0.88 0.61 - 1.24 mg/dL   Calcium 8.9 8.9 - 10.3 mg/dL   Total Protein 6.0 (L) 6.5 - 8.1 g/dL   Albumin 3.4 (L) 3.5 - 5.0 g/dL   AST 27 15 - 41 U/L   ALT 14 0 - 44 U/L   Alkaline Phosphatase 38 38 - 126 U/L   Total Bilirubin 0.9 0.3 - 1.2 mg/dL   GFR calc non Af Amer >60 >60 mL/min   GFR calc Af Amer >60 >60 mL/min     Anion gap 10 5 - 15  CBC with Differential  Result Value Ref Range   WBC 10.9 (H) 4.0 - 10.5 K/uL   RBC 3.72 (L) 4.22 - 5.81 MIL/uL   Hemoglobin 12.3 (L) 13.0 - 17.0 g/dL   HCT 36.7 (L) 39.0 - 52.0 %   MCV 98.7 78.0 - 100.0 fL   MCH 33.1 26.0 - 34.0 pg   MCHC 33.5 30.0 - 36.0 g/dL   RDW 15.2 11.5 - 15.5 %   Platelets 127 (L) 150 - 400 K/uL   Neutrophils Relative % 88 %   Neutro Abs 9.5 (H) 1.7 - 7.7 K/uL   Lymphocytes Relative 6 %   Lymphs Abs 0.6 (L) 0.7 - 4.0 K/uL   Monocytes Relative 6 %   Monocytes Absolute 0.7 0.1 - 1.0 K/uL   Eosinophils Relative 0 %   Eosinophils Absolute 0.0 0.0 - 0.7 K/uL   Basophils Relative 0 %   Basophils Absolute 0.0 0.0 - 0.1 K/uL  Blood gas, venous  Result Value Ref Range   O2 Content 5.0 L/min   Delivery systems NASAL CANNULA    pH, Ven 7.365 7.250 - 7.430   pCO2, Ven 40.4 (L) 44.0 - 60.0 mmHg   pO2, Ven 42.6 32.0 - 45.0 mmHg   Bicarbonate 22.5 20.0 - 28.0 mmol/L   Acid-base deficit 2.1 (H) 0.0 - 2.0 mmol/L   O2 Saturation 68.0 %   Patient temperature 98.6    Collection site VEIN    Drawn by [phone]    Sample type VENOUS   I-Stat CG4 Lactic Acid, ED  (not at  [facility])  Result Value Ref Range   Lactic Acid, Venous 1.17 0.5 - 1.9 mmol/L   Laboratory interpretation all normal except mild leukocytosis    EKG EKG Interpretation  Date/Time:  Friday September 20 2017  02:46:22 EDT Ventricular Rate:  102 PR Interval:    QRS Duration: 88 QT Interval:  335 QTC Calculation: 437 R Axis:   77 Text Interpretation:  Sinus tachycardia Minimal ST elevation, inferior leads Since last tracing rate faster  10 Sep 2003 Confirmed by Rolland Porter [phone]) on 09/20/2017 3:56:19 AM   Radiology     Ct Chest W Contrast  Result Date: 09/20/2017 CLINICAL DATA:  57 y/o M; dyspnea and hypoxia. Shortness of breath with fever over the past 5 days. History of lymphoma. EXAM: CT CHEST WITH CONTRAST TECHNIQUE: Multidetector CT imaging of the chest was performed  during intravenous contrast administration. CONTRAST:  186m ISOVUE-300 IOPAMIDOL (ISOVUE-300) INJECTION 61% COMPARISON:  03/15/2017 PET-CT and 03/20/2016 PET-CT. FINDINGS: Cardiovascular: No significant vascular findings. Normal heart size. No pericardial effusion. Right port catheter tip extends to the cavoatrial junction. Mediastinum/Nodes: Progression of mediastinal lymph nodes are enlarged when compared with the prior PET-CT, the largest in the right hilum measuring 16 x 12 mm (series 2, image 63). Severe right axillary lymphadenopathy with nodes measuring up to 4.8 x 2.4 cm (series 2, image 59). Lungs/Pleura: Numerous pulmonary nodules throughout the lungs are rounded masslike and likely represent pulmonary metastatic disease. More confluent consolidation throughout the left lower lobe and small left effusion is more consistent with pneumonia. No pneumothorax. Upper Abdomen: Stable liver cysts within the left lobe of the liver. Additional soft tissue attenuating lesions near the IVC and in the right lateral lobe of the liver are stable from prior PET-CTs where they did not demonstrate FDG uptake, likely benign. Musculoskeletal: No chest wall abnormality. No acute or significant osseous findings. IMPRESSION: 1. Progression of mediastinal adenopathy with large bulky lymph nodes in the right axilla measuring up to 4.8 cm. Findings likely represent progression of lymphoma. 2. Numerous masslike pulmonary nodules throughout the lungs, likely metastatic. 3. Confluent consolidation in the left lower lobe and small left effusion, likely superimposed pneumonia. Electronically Signed   By: LKristine GarbeM.D.   On: 09/20/2017 05:51     Dg Chest 2 View  Result Date: 09/19/2017 CLINICAL DATA:  Cough for 4 days, short of breath and wheezing today, fever, history of T-cell lymphoma EXAM: CHEST - 2 VIEW COMPARISON:  Chest x-ray of 02/08/2017 and PET-CT of 03/15/2016 FINDINGS: There are now multiple nodular  opacities scattered throughout both lungs. This could represent inflammatory or infectious process, but of fullness involvement of the lungs cannot be excluded. CT of the chest is recommended to evaluate further. No pleural effusion is seen. Mediastinal and hilar contours are unremarkable. Right-sided Port-A-Cath tip is seen to the lower SVC. The heart is within normal limits in size. There are degenerative changes throughout the thoracic spine. IMPRESSION: Interval development of nodular opacities throughout both lungs. Possible inflammatory or infectious process but cannot exclude metastatic or lymphomatous lesions. Recommend CT of the chest. Electronically Signed   By: PIvar DrapeM.D.   On: 09/19/2017 12:06    Procedures .Critical Care Performed by: KRolland Porter MD Authorized by: KRolland Porter MD   Critical care provider statement:    Critical care time (minutes):  42   Critical care was necessary to treat or prevent imminent or life-threatening deterioration of the following conditions:  Respiratory failure and sepsis   Critical care was time spent personally by me on the following activities:  Discussions with consultants, examination of patient, obtaining history from patient or surrogate, ordering and review of laboratory studies, ordering and review of radiographic studies, pulse oximetry,  re-evaluation of patient's condition and review of old charts   (including critical care time)  Medications Ordered in ED Medications  albuterol (PROVENTIL) (2.5 MG/3ML) 0.083% nebulizer solution 5 mg (5 mg Nebulization Given 09/20/17 0259)  levofloxacin (LEVAQUIN) IVPB 750 mg (0 mg Intravenous Stopped 09/20/17 0513)  sodium chloride 0.9 % bolus 1,000 mL (0 mLs Intravenous Stopped 09/20/17 0440)    And  sodium chloride 0.9 % bolus 1,000 mL (1,000 mLs Intravenous New Bag/Given 09/20/17 0441)  acetaminophen (TYLENOL) tablet 1,000 mg (1,000 mg Oral Given 09/20/17 0406)  albuterol (PROVENTIL) (2.5 MG/3ML)  0.083% nebulizer solution 5 mg (5 mg Nebulization Given 09/20/17 0410)  iopamidol (ISOVUE-300) 61 % injection (100 mLs  Contrast Given 09/20/17 0511)     Initial Impression / Assessment and Plan / ED Course  I have reviewed the triage vital signs and the nursing notes.  Pertinent labs & imaging results that were available during my care of the patient were reviewed by me and considered in my medical decision making (see chart for details).   Patient was noted to have pulse ox 79% on room air in triage.  He was placed on nasal cannula oxygen and his pulse ox improved to 91%.  Patient was given a albuterol nebulizer treatment for his rhonchi on exam.  I feel with the cough, fever, hypoxia he most likely has a pneumonia.  He was given IV Levaquin.  Laboratory testing was done including blood cultures.  Nurse reports his pulse ox is 91% on 5 L/min nasal cannula.  He was placed on a nonrebreather mask.  Recheck at 4 AM patient states he is breathing better.  He is now on a nonrebreather mask and his pulse ox is 95 to 96%.  When I listen to him he has improved air movement, he still has some rhonchi but improved from before.  He was given a second nebulizer.  I talked to the patient and his family that we would do the chest CT tonight however I wanted to get his breathing better before he went to CT scan.  04:40 AM Finishing his second nebulizer tx. States he is feeling better. I laid him flat and he said he felt more rattling in his chest. But feels like he can try to get a CT now.   Recheck at 5:50 AM patient has had his CT scan.  He has finished his second nebulizer.  He now is on a nasal cannula and his pulse ox is 91% on room air.  Patient states he is feeling better.  Patient appears to be less distressed than when he first came in.  When I listen to his lungs I still hear some diffuse rattling and rare rhonchi.  He is noted to be sweating where he was not before.  His temperature is 98.4.  VBG was  ordered.  Venous blood gas shows pH 7.37, PCO2 40, PO2 42.6, 60% saturated.  Patient is not retaining PCO2, pH is normal.  Patient was discussed with critical care at 6:24 AM, Dr. Gladys Damme feels patient could be admitted to regular floor.  6:31 AM Dr. Maudie Mercury, hospitalist will admit.  Patient was given the results of his CT scan.  Currently he is on nasal cannula oxygen and his pulse ox is 95%.  He still looks like he is sweating.  But he is in much less respiratory distress than when he arrived.  He is agreeable for admission.  Final Clinical Impressions(s) / ED Diagnoses   Final diagnoses:  Community acquired pneumonia of left lower lobe of lung ([place])  Pleural effusion, left  Metastatic neoplastic disease ([place])  Hypoxia  Bronchospasm    Plan admission  Rolland Porter, MD, Barbette Or, MD 09/20/17 [id number]

## 2017-09-20 NOTE — ED Triage Notes (Signed)
Pt arrives to triage with c/o shortness of breath. Initially in triage his sats were 79% on room air. Family reports the patient has not been feeling well (cough and fever) since Monday and his doctor was concerned for PNA, he was started Levaquin abx. He just finished clinical trial for T cell lymphoma, has right chest port a cath.

## 2017-09-20 NOTE — ED Notes (Signed)
Bed: WA15 Expected date:  Expected time:  Means of arrival:  Comments: ResB 

## 2017-09-20 NOTE — Progress Notes (Signed)
IP PROGRESS NOTE  Subjective:   Mr. Going is well-known to me with a history of non-Hodgkin's lymphoma.  He completed a final cycle of bendamustine/CPI-613 on a clinical trial at [facility] beginning 09/02/2017.  He received Neulasta 09/09/2017.  He reports the onset of fever, cough, and dyspnea 09/16/2017.  He was evaluated at [facility] on 09/17/2017  The dyspnea has progressed.  He presented to the the Cancer center yesterday and was placed on Levaquin after a chest x-ray revealed evidence of pneumonia.  His symptoms worsened last night and he presented to the emergency room.  He reports feeling better on oxygen and after nebulizer treatment.  He has noted enlargement of a right axillary lymph node over the past few weeks.  Objective: Vital signs in last 24 hours: Blood pressure 139/90, pulse 77, temperature 97.9 F (36.6 C), temperature source Oral, resp. rate 16, weight 189 lb 9.5 oz (86 kg), SpO2 100 %.  Intake/Output from previous day: 08/29 0701 - 08/30 0700 In: 1150 [IV Piggyback:1150] Out: -   Physical Exam:  HEENT: No thrush or ulcers Lungs: Good air movement bilaterally Cardiac: Regular rate and rhythm Abdomen: No hepatosplenomegaly, no mass, nontender Extremities: Leg edema Lymph nodes: Large node in the right axilla.  No other palpable lymph nodes.  Portacath/PICC-without erythema  Lab Results: Recent Labs    09/19/17 0911 09/20/17 0300  WBC 9.8 10.9*  HGB 12.4* 12.3*  HCT 36.2* 36.7*  PLT 131* 127*    BMET Recent Labs    09/19/17 0911 09/20/17 0300  NA 140 142  K 4.2 3.9  CL 105 106  CO2 25 26  GLUCOSE 108* 143*  BUN 11 15  CREATININE 0.80 0.88  CALCIUM 9.1 8.9    No results found for: CEA1  Studies/Results: Dg Chest 2 View  Result Date: 09/19/2017 CLINICAL DATA:  Cough for 4 days, short of breath and wheezing today, fever, history of T-cell lymphoma EXAM: CHEST - 2 VIEW COMPARISON:  Chest x-ray of 02/08/2017 and PET-CT of 03/15/2016  FINDINGS: There are now multiple nodular opacities scattered throughout both lungs. This could represent inflammatory or infectious process, but of fullness involvement of the lungs cannot be excluded. CT of the chest is recommended to evaluate further. No pleural effusion is seen. Mediastinal and hilar contours are unremarkable. Right-sided Port-A-Cath tip is seen to the lower SVC. The heart is within normal limits in size. There are degenerative changes throughout the thoracic spine. IMPRESSION: Interval development of nodular opacities throughout both lungs. Possible inflammatory or infectious process but cannot exclude metastatic or lymphomatous lesions. Recommend CT of the chest. Electronically Signed   By: Ivar Drape M.D.   On: 09/19/2017 12:06   Ct Chest W Contrast  Result Date: 09/20/2017 CLINICAL DATA:  57 y/o M; dyspnea and hypoxia. Shortness of breath with fever over the past 5 days. History of lymphoma. EXAM: CT CHEST WITH CONTRAST TECHNIQUE: Multidetector CT imaging of the chest was performed during intravenous contrast administration. CONTRAST:  154m ISOVUE-300 IOPAMIDOL (ISOVUE-300) INJECTION 61% COMPARISON:  03/15/2017 PET-CT and 03/20/2016 PET-CT. FINDINGS: Cardiovascular: No significant vascular findings. Normal heart size. No pericardial effusion. Right port catheter tip extends to the cavoatrial junction. Mediastinum/Nodes: Progression of mediastinal lymph nodes are enlarged when compared with the prior PET-CT, the largest in the right hilum measuring 16 x 12 mm (series 2, image 63). Severe right axillary lymphadenopathy with nodes measuring up to 4.8 x 2.4 cm (series 2, image 59). Lungs/Pleura: Numerous pulmonary nodules throughout the  lungs are rounded masslike and likely represent pulmonary metastatic disease. More confluent consolidation throughout the left lower lobe and small left effusion is more consistent with pneumonia. No pneumothorax. Upper Abdomen: Stable liver cysts within the  left lobe of the liver. Additional soft tissue attenuating lesions near the IVC and in the right lateral lobe of the liver are stable from prior PET-CTs where they did not demonstrate FDG uptake, likely benign. Musculoskeletal: No chest wall abnormality. No acute or significant osseous findings. IMPRESSION: 1. Progression of mediastinal adenopathy with large bulky lymph nodes in the right axilla measuring up to 4.8 cm. Findings likely represent progression of lymphoma. 2. Numerous masslike pulmonary nodules throughout the lungs, likely metastatic. 3. Confluent consolidation in the left lower lobe and small left effusion, likely superimposed pneumonia. Electronically Signed   By: Kristine Garbe M.D.   On: 09/20/2017 05:51   CT images reviewed  Medications: I have reviewed the patient's current medications.  Assessment/Plan:  1. 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  PET scan 03/20/2016 with extensive bulky hypermetabolic nodal activity in the neck, chest, abdomen and pelvis; mild splenomegaly with diffusely mildly accentuated splenic activity  Staging bone marrow biopsy 03/23/2016-negative for involvement with lymphoma  Cycle 1 EPOCH beginning 03/23/2016  Cycle 2 [facility] 04/13/2016  Restaging PET scan at [facility] 05/08/2016-lymph nodes with various degrees of FDG activity in the neck, axilla, right written him, mesentery, pelvis, and groin. Indeterminate liver lesions without FDG activity, nodular mass in the posterior medial aspect of the left thigh  Cycle 3 EPOCH04/27/2018  Cycle 1  CVP 06/21/2016  Cytoxan/prednisone plus brentuximab 07/17/2016  Cytoxan/prednisone plus brentuximab 08/07/2016 (dose reduced)  Initiation of every three-week brentuximab08/08/2016  brentuximabdose reduced 10/31/2016 secondary to neuropathy  Brentuximab further dose reduced 01/03/2017 secondary to neuropathy  Presentation with parietal scalp nodular lesion 03/08/2017  Staging PET scan 2/35/3614-ERXVQMG hypermetabolic lymphadenopathy in the neck, chest, abdomen/pelvis, and hypermetabolic cutaneous lesions  03/22/2017 CT biopsy left retroperitoneal nodal mass-recurrent CD30 positive T-cell lymphoma  Cycle 1 bendamustine/CPI-613on clinical trial at [facility] 04/08/2017  CTs 05/22/2017 After 2 cycles-mixed response: Enlargement of left supraclavicular node, right axillary node, right pelvic sidewall mass, decreased retroperitoneal abdominal adenopathy  Cycle 3 bendamustine/CPI-613 06/03/2017  Cycle 4 bendamustine/CPI-613 07/09/2017  CT 08/05/2017- increase in the size of a level to a lymph node, decreased size of level 3 and supraclavicular nodes, enlargement of right axillary nodes, new bilateral pulmonary nodules, decreased mediastinal, rectal peritoneal, and iliac nodes  Cycle 5 bendamustine/CPI-613 08/06/2017  Cycle 6 bendamustine/CPI-613 09/02/2017 2. 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 He entered clinical remission following chemotherapy and remained in remission when he was seen at the cancer center 02/24/2009. 3. 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 Staging PET scan 08/23/2010 with increased FDG activity at the left tonsillar fossa and no additional evidence of lymphoma. He completed 4 cycles of R-ICE with cycle #1 beginning on 09/19/2010 and  cycle #4 on 11/21/2010. Repeat head and neck examination by Dr. Constance Holster following  R-ICE/rituximab showed no residual lymphoma. He began radiation consolidation on 12/18/2010, radiation was completed on 01/22/2011. 4. History of neutropenia secondary to rituximab. 5. Anemia secondary to chemotherapy, status post a red blood cell transfusion 11/30/2010. The hemoglobin has normalized. 6. History of mild thrombocytopenia secondary to chemotherapy. 7. Hypertension. 8. 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 05/08/2017-nodular mass in the left thigh adjacent to the femur  MRI 06/07/2016-infiltrative mass in the left thigh adductor muscle, separate from the schwannoma excision site  MRI 10/08/2016-marked improvement in the left adductormusclemass with a small area of enhancement remaining, no discrete mass 9. Port-A-Cath placement 03/21/2016 10. Superficial thrombus left greater saphenous vein 04/13/2016. Lovenox initiated, converted to Xarelto beginning 04/18/2016 11. 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 Admission 09/20/2017 with cough/fever/dyspnea-CT concerning for progression of lymphoma in the lungs versus pneumonia   Mr. Geurts has a long-standing history of non-Hodgkin's lymphoma.  He recently completed treatment on a clinical trial at [facility] with bendamustine and CPI-613.  He has been referred to [facility] to consider CAR-T therapy. He presents with dyspnea/hypoxia.  A chest x-ray and CT chest are concerning for pneumonia and progressive disease in the lungs.  He will be admitted for treatment and further evaluation.  I am concerned he may have progressive disease in the lungs based on the CT findings and the elevated LDH earlier this week.  I agree with broad-spectrum intravenous antibiotics.  Infectious disease/pulmonary  consultation if his clinical status has not improved over the next 1-2 days.  We will need to consider a bronchoscopy or IR biopsy of a lung mass.  Recommendations: 1.  Cultures of the blood, urine, and sputum 2.  Broad-spectrum intravenous antibiotics 3.  Infectious disease consult if clinical status not improved over the next 24 hours 4.  Check LDH 5.  I will contact [facility] to discuss salvage treatment options in case his clinical status does not improve with antibiotics. 6.  Please call as needed.  Oncology will see him 09/21/2017    LOS: 0 days   [name], MD   09/20/2017, 10:05 AM

## 2017-09-20 NOTE — ED Notes (Signed)
Patient is aware a urine sample is needed. Urinal at bedside.

## 2017-09-21 ENCOUNTER — Inpatient Hospital Stay (HOSPITAL_COMMUNITY): Payer: 59

## 2017-09-21 DIAGNOSIS — J9601 Acute respiratory failure with hypoxia: Secondary | ICD-10-CM

## 2017-09-21 DIAGNOSIS — R918 Other nonspecific abnormal finding of lung field: Secondary | ICD-10-CM

## 2017-09-21 DIAGNOSIS — R5081 Fever presenting with conditions classified elsewhere: Secondary | ICD-10-CM

## 2017-09-21 DIAGNOSIS — D61818 Other pancytopenia: Secondary | ICD-10-CM

## 2017-09-21 DIAGNOSIS — Z86718 Personal history of other venous thrombosis and embolism: Secondary | ICD-10-CM

## 2017-09-21 LAB — BASIC METABOLIC PANEL
Anion gap: 8 (ref 5–15)
BUN: 8 mg/dL (ref 6–20)
CHLORIDE: 105 mmol/L (ref 98–111)
CO2: 26 mmol/L (ref 22–32)
Calcium: 7.9 mg/dL — ABNORMAL LOW (ref 8.9–10.3)
Creatinine, Ser: 0.67 mg/dL (ref 0.61–1.24)
GFR calc non Af Amer: >60 mL/min (ref >60)
Glucose, Bld: 108 mg/dL — ABNORMAL HIGH (ref 70–99)
POTASSIUM: 3.7 mmol/L (ref 3.5–5.1)
SODIUM: 139 mmol/L (ref 135–145)

## 2017-09-21 LAB — CBC
HEMATOCRIT: 30.3 % — AB (ref 39.0–52.0)
Hemoglobin: 10.4 g/dL — ABNORMAL LOW (ref 13.0–17.0)
MCH: 33.8 pg (ref 26.0–34.0)
MCHC: 34.3 g/dL (ref 30.0–36.0)
MCV: 98.4 fL (ref 78.0–100.0)
Platelets: 108 10*3/uL — ABNORMAL LOW (ref 150–400)
RBC: 3.08 MIL/uL — AB (ref 4.22–5.81)
RDW: 15.4 % (ref 11.5–15.5)
WBC: 9.1 10*3/uL (ref 4.0–10.5)

## 2017-09-21 LAB — EXPECTORATED SPUTUM ASSESSMENT W GRAM STAIN, RFLX TO RESP C

## 2017-09-21 LAB — ECHOCARDIOGRAM COMPLETE
Height: 68 in
Weight: 3033.53 oz

## 2017-09-21 LAB — MRSA PCR SCREENING: MRSA by PCR: NEGATIVE

## 2017-09-21 LAB — HIV ANTIBODY (ROUTINE TESTING W REFLEX): HIV Screen 4th Generation wRfx: NONREACTIVE

## 2017-09-21 MED ORDER — SODIUM CHLORIDE 0.9 % IV SOLN
INTRAVENOUS | Status: DC
  Administered 2017-09-21 – 2017-09-23 (×3): via INTRAVENOUS

## 2017-09-21 MED ORDER — SODIUM CHLORIDE 0.9 % IV SOLN: INTRAVENOUS | Status: DC

## 2017-09-21 NOTE — Progress Notes (Signed)
[facility] H[facility]PITALIST PROGRESS NOTE  RISHAAN Lee VPX:[id number] DOB: August 13, 1960 DOA: 09/20/2017 PCP: [name], MD   Narrative: 43 male-extensive h/o NHL h/o since 2005  recurrence ? 07/2010 Rx for ALK Neg Anaplastic Large at [place] --referred to [facility] to consider CAR-T -->had visit to [facility]-recived EPOCH x 4 cycle PET scan 02/2017 for painless bump showed T-cell Lymphoma Recent visit OP 09/17/17 to onc-had fever 102-FELT BY ONCOLOGY TO HAVE PROGRESSION OF DX  Given fever, night sweats, ? LDH, enlarged lymph node Given levaquin in case of fever-at that OV-told to take it 8/29 when visiitng [facility] and went home, deat to 70's and diaphoretic  Admit [facility] 2/2 hypoxia in 70's-CT chest? Lower lung PNa vs disease progression    A & Plan Hypoxic resp failure-Improved-now on 5 liters oxygen--desat screen in am dependant on progress  PNA-Rx with broad sepctrum Abx vanc cefepime-get CXR in am-follow cult-get sputum-clinically improved--Fever curve 99-100 Cut saline to 50 cc/h and then off in 12 hours this evening Cont tussionex--started on prednisone 40 on admit--stop in 5 days copnt albuterol q 2 prn, Duoneb 3 ml tid Search for other causes of fever on--Port look clean--Echo ordered although no current murmur  T cell lymphoma with concerns for spread--unclear at this time if this is primarily pna or Cancer progression-I mentioned this to family- wife was distraught and asked me to be "positive about things" and we had a good conversation about expectations and day-to-day re-assessment under guidance of oncology--unclear overall what are the goals with this patient?--have forwarded to primary oncologist Dr. [name] for input, appreciate input from Dr. Alvy Bimler  HTN-Atenolol 50--slightly hypertensive  Superficial Venous thrombosis LLE 03/2016-cont xarelto    Xarelto, full code, inpatient-dispo at this time unclear   Verlon Au, MD  [facility] Direct contact:  [phone] --Via amion app OR  --[email or web address]; password [email or web address]  7PM-7AM contact night coverage as above 09/21/2017, 11:33 AM  LOS: 1 day   Consultants:  onc  Procedures:  no  Antimicrobials:  Cefepime and vanc  Interval history/Subjective:  Awake no appetite anxious sitting upright No sob No f atpresent   Objective:  Vitals:  Vitals:   09/21/17 0456 09/21/17 0744  BP: (!) 151/82   Pulse: 90   Resp: (!) 26   Temp: 99.4 F (37.4 C)   SpO2: 93% 92%    Exam:  Alert no distress Fine tremor Pupils reactive Rales posterolat R side, no tvr tvf abd soft R axillary swelling No le edema  I have personally reviewed the following:   Labs:  Hb 10.4, PLT 108, glucose 108  Imaging studies:  n  Medical tests:  n   Test discussed with performing physician:  Yes-discussed with Dr. Alvy Bimler in detail  Decision to obtain old records:  n  Review and summation of old records:  n  Scheduled Meds: . atenolol  50 mg Oral Daily  . benzonatate  100 mg Oral TID  . chlorpheniramine-HYDROcodone  5 mL Oral Q12H  . ipratropium-albuterol  3 mL Nebulization TID  . loratadine  10 mg Oral Daily  . predniSONE  40 mg Oral Q breakfast  . rivaroxaban  20 mg Oral Q breakfast   Continuous Infusions: . sodium chloride 50 mL/hr at 09/21/17 0924  . ceFEPime (MAXIPIME) IV 1 g (09/21/17 0601)  . vancomycin 1,000 mg (09/21/17 0834)    Principal Problem:   Acute respiratory failure (HCC) Active Problems:   Anemia, unspecified   Non Hodgkin's lymphoma ([place])  HCAP (healthcare-associated pneumonia)   Acute respiratory failure with hypoxia ([place])   LOS: 1 day

## 2017-09-21 NOTE — Progress Notes (Signed)
  Echocardiogram 2D Echocardiogram has been performed.  [name] F 09/21/2017, 12:20 PM

## 2017-09-21 NOTE — Progress Notes (Signed)
[name]   DOB:1960/01/28   FT#:[id number]    Assessment & Plan:  Recurrent lymphoma He had recent chemotherapy. Continue supportive care  Bilateral lung nodules and fever Highly suspicious for cancer versus infection Continue broad-spectrum IV antibiotics; cultures are pending Recommend echocardiogram to exclude possible endocarditis as a source of infection  Shortness of breath He had significant exposure to chemotherapy including Adriamycin-containing regimen Hopefully, echocardiogram will also exclude cardiomyopathy  Mild acquired pancytopenia Likely related to recent chemotherapy Continue to monitor closely.  History of DVT He will continue anticoagulation therapy  Discharge planning He will likely remain in the hospital for the next 48 to 72 hours pending clinical progress  Don Lark, MD 09/21/2017  6:45 AM   Subjective:  This patient had numerous cycles of chemotherapy due to recurrent lymphoma, patient of Dr. Benay Lee.  He was admitted yesterday due to fever and shortness of breath According to his wife, he had good sleep last night.  He has orthopnea.  He is doing well on oxygen therapy.  He has minimum productive cough.  His sputum look yellowish.  No hemoptysis. He has been afebrile since admission.  No chills.  Denies recent sick contacts.  No recent diarrhea, dysuria, frequency or urgency The patient denies any recent signs or symptoms of bleeding such as spontaneous epistaxis, hematuria or hematochezia.  Objective:  Vitals:   09/20/17 2235 09/21/17 0456  BP:  (!) 151/82  Pulse:  90  Resp:  (!) 26  Temp: 99.3 F (37.4 C) 99.4 F (37.4 C)  SpO2:  93%     Intake/Output Summary (Last 24 hours) at 09/21/2017 0645 Last data filed at 09/20/2017 2234 Gross per 24 hour  Intake 1080 ml  Output -  Net 1080 ml    GENERAL:alert, no distress and comfortable.  He has oxygen delivered via nasal cannula SKIN: skin color, texture, turgor are normal, no rashes or  significant lesions EYES: normal, Conjunctiva are pink and non-injected, sclera clear OROPHARYNX:no exudate, no erythema and lips, buccal mucosa, and tongue normal  NECK: supple, thyroid normal size, non-tender, without nodularity LYMPH:  no palpable lymphadenopathy in the cervical, axillary or inguinal LUNGS: clear to auscultation and percussion with normal breathing effort HEART: regular rate & rhythm and no murmurs and no lower extremity edema ABDOMEN:abdomen soft, non-tender and normal bowel sounds Musculoskeletal:no cyanosis of digits and no clubbing  NEURO: alert & oriented x 3 with fluent speech, no focal motor/sensory deficits   Labs:  Lab Results  Component Value Date   WBC 9.1 09/21/2017   HGB 10.4 (L) 09/21/2017   HCT 30.3 (L) 09/21/2017   MCV 98.4 09/21/2017   PLT 108 (L) 09/21/2017   NEUTROABS 9.5 (H) 09/20/2017    Lab Results  Component Value Date   NA 139 09/21/2017   K 3.7 09/21/2017   CL 105 09/21/2017   CO2 26 09/21/2017    Studies: I have personally reviewed imaging studies Dg Chest 2 View  Result Date: 09/19/2017 CLINICAL DATA:  Cough for 4 days, short of breath and wheezing today, fever, history of T-cell lymphoma EXAM: CHEST - 2 VIEW COMPARISON:  Chest x-ray of 02/08/2017 and PET-CT of 03/15/2016 FINDINGS: There are now multiple nodular opacities scattered throughout both lungs. This could represent inflammatory or infectious process, but of fullness involvement of the lungs cannot be excluded. CT of the chest is recommended to evaluate further. No pleural effusion is seen. Mediastinal and hilar contours are unremarkable. Right-sided Port-A-Cath tip is seen to the  lower SVC. The heart is within normal limits in size. There are degenerative changes throughout the thoracic spine. IMPRESSION: Interval development of nodular opacities throughout both lungs. Possible inflammatory or infectious process but cannot exclude metastatic or lymphomatous lesions. Recommend  CT of the chest. Electronically Signed   By: Ivar Drape M.D.   On: 09/19/2017 12:06   Ct Chest W Contrast  Result Date: 09/20/2017 CLINICAL DATA:  57 y/o M; dyspnea and hypoxia. Shortness of breath with fever over the past 5 days. History of lymphoma. EXAM: CT CHEST WITH CONTRAST TECHNIQUE: Multidetector CT imaging of the chest was performed during intravenous contrast administration. CONTRAST:  111mL ISOVUE-300 IOPAMIDOL (ISOVUE-300) INJECTION 61% COMPARISON:  03/15/2017 PET-CT and 03/20/2016 PET-CT. FINDINGS: Cardiovascular: No significant vascular findings. Normal heart size. No pericardial effusion. Right port catheter tip extends to the cavoatrial junction. Mediastinum/Nodes: Progression of mediastinal lymph nodes are enlarged when compared with the prior PET-CT, the largest in the right hilum measuring 16 x 12 mm (series 2, image 63). Severe right axillary lymphadenopathy with nodes measuring up to 4.8 x 2.4 cm (series 2, image 59). Lungs/Pleura: Numerous pulmonary nodules throughout the lungs are rounded masslike and likely represent pulmonary metastatic disease. More confluent consolidation throughout the left lower lobe and small left effusion is more consistent with pneumonia. No pneumothorax. Upper Abdomen: Stable liver cysts within the left lobe of the liver. Additional soft tissue attenuating lesions near the IVC and in the right lateral lobe of the liver are stable from prior PET-CTs where they did not demonstrate FDG uptake, likely benign. Musculoskeletal: No chest wall abnormality. No acute or significant osseous findings. IMPRESSION: 1. Progression of mediastinal adenopathy with large bulky lymph nodes in the right axilla measuring up to 4.8 cm. Findings likely represent progression of lymphoma. 2. Numerous masslike pulmonary nodules throughout the lungs, likely metastatic. 3. Confluent consolidation in the left lower lobe and small left effusion, likely superimposed pneumonia. Electronically  Signed   By: Kristine Garbe M.D.   On: 09/20/2017 05:51

## 2017-09-22 ENCOUNTER — Encounter (HOSPITAL_COMMUNITY): Payer: Self-pay

## 2017-09-22 DIAGNOSIS — C8331 Diffuse large B-cell lymphoma, lymph nodes of head, face, and neck: Secondary | ICD-10-CM

## 2017-09-22 DIAGNOSIS — J9600 Acute respiratory failure, unspecified whether with hypoxia or hypercapnia: Secondary | ICD-10-CM

## 2017-09-22 LAB — CBC WITH DIFFERENTIAL/PLATELET
Basophils Absolute: 0 10*3/uL (ref 0.0–0.1)
Basophils Relative: 0 %
Eosinophils Absolute: 0.1 10*3/uL (ref 0.0–0.7)
Eosinophils Relative: 1 %
HEMATOCRIT: 29.4 % — AB (ref 39.0–52.0)
HEMOGLOBIN: 10 g/dL — AB (ref 13.0–17.0)
LYMPHS ABS: 0.4 10*3/uL — AB (ref 0.7–4.0)
LYMPHS PCT: 4 %
MCH: 33.4 pg (ref 26.0–34.0)
MCHC: 34 g/dL (ref 30.0–36.0)
MCV: 98.3 fL (ref 78.0–100.0)
MONOS PCT: 4 %
Monocytes Absolute: 0.4 10*3/uL (ref 0.1–1.0)
NEUTROS ABS: 8.5 10*3/uL — AB (ref 1.7–7.7)
NEUTROS PCT: 91 %
Platelets: 120 10*3/uL — ABNORMAL LOW (ref 150–400)
RBC: 2.99 MIL/uL — ABNORMAL LOW (ref 4.22–5.81)
RDW: 15.1 % (ref 11.5–15.5)
WBC: 9.3 10*3/uL (ref 4.0–10.5)

## 2017-09-22 LAB — COMPREHENSIVE METABOLIC PANEL
ALBUMIN: 2.6 g/dL — AB (ref 3.5–5.0)
ALT: 12 U/L (ref 0–44)
AST: 21 U/L (ref 15–41)
Alkaline Phosphatase: 26 U/L — ABNORMAL LOW (ref 38–126)
Anion gap: 7 (ref 5–15)
BUN: 9 mg/dL (ref 6–20)
CO2: 29 mmol/L (ref 22–32)
Calcium: 8.3 mg/dL — ABNORMAL LOW (ref 8.9–10.3)
Chloride: 106 mmol/L (ref 98–111)
Creatinine, Ser: 0.69 mg/dL (ref 0.61–1.24)
GFR calc non Af Amer: >60 mL/min (ref >60)
Glucose, Bld: 121 mg/dL — ABNORMAL HIGH (ref 70–99)
Potassium: 3.4 mmol/L — ABNORMAL LOW (ref 3.5–5.1)
SODIUM: 142 mmol/L (ref 135–145)
Total Bilirubin: 0.3 mg/dL (ref 0.3–1.2)
Total Protein: 4.9 g/dL — ABNORMAL LOW (ref 6.5–8.1)

## 2017-09-22 LAB — URINE CULTURE: CULTURE: NO GROWTH

## 2017-09-22 LAB — LEGIONELLA PNEUMOPHILA SEROGP 1 UR AG: L. pneumophila Serogp 1 Ur Ag: NEGATIVE

## 2017-09-22 MED ORDER — IPRATROPIUM-ALBUTEROL 0.5-2.5 (3) MG/3ML IN SOLN
3.0000 mL | Freq: Four times a day (QID) | RESPIRATORY_TRACT | Status: DC | PRN
  Administered 2017-09-22 – 2017-09-24 (×3): 3 mL via RESPIRATORY_TRACT
  Filled 2017-09-22 (×3): qty 3

## 2017-09-22 NOTE — Progress Notes (Addendum)
[facility] H[facility]PITALIST PROGRESS NOTE  [name] YIF:[id number] DOB: [birth year] DOA: 09/20/2017 PCP: [name], MD   Narrative: 59 male-extensive h/o NHL h/o since 2005  recurrence ? 07/2010 Rx for ALK Neg Anaplastic Large at [place] --referred to [facility] to consider CAR-T -->had visit to [facility]-recieved EPOCH x 4 cycle PET scan 02/2017 for painless bump showed T-cell Lymphoma Recent visit OP 09/17/17 to onc-had fever 102-FELT BY ONCOLOGY TO HAVE PROGRESSION OF DX  Given fever, night sweats, ? LDH, enlarged lymph node  Given levaquin in case of fever-at that OV-told to take it 8/29 when visiitng [facility] and went home, deat to 70's and diaphoretic  Admit [facility] 2/2 hypoxia in 70's-CT chest? Lower lung PNa vs disease progression    A & Plan Hypoxic resp failure-Improved-not really needing as much oxygen-feels closer to wnl-3 liters oxygen Stop albuterol [makes jittery]  PNA-Rx with broad sepctrum Abx vanc/ cefepime--could presumbly if CBC wnl de-escalate to Augmentin in am-declines getting CXR Await sputum cult Cont tussionex--started on prednisone 40 [some evidence for salutatory effx for PNA] on admit--stop 8/4  T cell lymphoma with concerns for spread--PNA>progression disease? mentioned this to family 8.31.19- wife was distraught and asked me to be "positive about things"  Defer to Dr. [name] further options-appreciate Dr. Alvy Bimler continued input  HTN-Atenolol 50--slightly hypertensive-no changes  Superficial Venous thrombosis LLE 03/2016-cont xarelto    Xarelto, full code, inpatient-dispo likely 1-2 more days--need to de-escalate to po abx--maybe Augmentin if sputum non specific [already rx on levaquin-immunocompromised to use stronger meds]   Verlon Au, MD  [facility] Direct contact: [phone] --Via amion app OR  --[email or web address]; password [email or web address]  7PM-7AM contact night coverage as above 09/22/2017, 9:18 AM  LOS: 2 days    Consultants:  onc  Procedures:  no  Antimicrobials:  Cefepime and vanc  Interval history/Subjective:  Awake pleasant in nad Feels closer to 60% his normal but not quite there ambulating some uin halls desat when eating and had to be put back on oxygen  Objective:  Vitals:  Vitals:   09/22/17 0838 09/22/17 0903  BP:    Pulse:    Resp:    Temp:    SpO2: 93% 92%    Exam:  Alert no distress Fine tremor Pupils reactive Rales posterolat R side still pesent, no tvr tvf abd soft R axillary swelling No le edema  I have personally reviewed the following:   Labs:  Wbc 9, henoglobin 10, plt 120  Imaging studies:  ECHO 8/31 no evidence endocarditis  Medical tests:  n   Test discussed with performing physician:  Yes-discussed with Dr. Alvy Bimler in detail  Decision to obtain old records:  n  Review and summation of old records:  n  Scheduled Meds: . atenolol  50 mg Oral Daily  . benzonatate  100 mg Oral TID  . chlorpheniramine-HYDROcodone  5 mL Oral Q12H  . predniSONE  40 mg Oral Q breakfast  . rivaroxaban  20 mg Oral Q breakfast   Continuous Infusions: . sodium chloride 10 mL/hr at 09/21/17 2058  . ceFEPime (MAXIPIME) IV Stopped (09/22/17 0630)  . vancomycin 1,000 mg (09/22/17 0810)    Principal Problem:   Acute respiratory failure (HCC) Active Problems:   Anemia, unspecified   Non Hodgkin's lymphoma ([place])   HCAP (healthcare-associated pneumonia)   Acute respiratory failure with hypoxia ([place])   LOS: 2 days

## 2017-09-22 NOTE — Progress Notes (Signed)
[name]   DOB:[birth year]   OK#:[id number]    Assessment & Plan:   Recurrent lymphoma He had recent chemotherapy. Continue supportive care According to family, he has PET CT scan scheduled to be done at [facility] next week.  I will defer discussion with his oncologist whether he should keep that appointment I will delay  Bilateral lung nodules and fever and respiratory failure Highly suspicious for cancer versus infection Continue broad-spectrum IV antibiotics; cultures are pending Echocardiogram showed no evidence of endocarditis He may benefit from consideration of pulmonology evaluation if not improved Recommend attempt to reduce oxygen today down to 2L if possible  Shortness of breath He had significant exposure to chemotherapy including Adriamycin-containing regimen Echocardiogram showed preserved ejection fraction  Mild acquired pancytopenia Likely related to recent chemotherapy Continue to monitor closely.  History of DVT He will continue anticoagulation therapy  Discharge planning He will likely remain in the hospital for the next 48 to 72 hours pending clinical progress Dr. [name] will check on him tomorrow.  [name], MD 09/22/2017  9:18 AM   Subjective:  He feels well today.  He has less shortness of breath.  Minimum cough.  He has been afebrile.  Objective:  Vitals:   09/22/17 0838 09/22/17 0903  BP:    Pulse:    Resp:    Temp:    SpO2: 93% 92%     Intake/Output Summary (Last 24 hours) at 09/22/2017 0918 Last data filed at 09/22/2017 0700 Gross per 24 hour  Intake 1837.25 ml  Output -  Net 1837.25 ml    GENERAL:alert, no distress and comfortable SKIN: skin color, texture, turgor are normal, no rashes or significant lesions EYES: normal, Conjunctiva are pink and non-injected, sclera clear LUNGS: Noted some crackles at the lung base without wheezes HEART: regular rate & rhythm and no murmurs and no lower extremity  edema ABDOMEN:abdomen soft, non-tender and normal bowel sounds Musculoskeletal:no cyanosis of digits and no clubbing  NEURO: alert & oriented x 3 with fluent speech, no focal motor/sensory deficits   Labs:  Lab Results  Component Value Date   WBC 9.3 09/22/2017   HGB 10.0 (L) 09/22/2017   HCT 29.4 (L) 09/22/2017   MCV 98.3 09/22/2017   PLT 120 (L) 09/22/2017   NEUTROABS 8.5 (H) 09/22/2017    Lab Results  Component Value Date   NA 142 09/22/2017   K 3.4 (L) 09/22/2017   CL 106 09/22/2017   CO2 29 09/22/2017

## 2017-09-23 ENCOUNTER — Inpatient Hospital Stay (HOSPITAL_COMMUNITY): Payer: 59

## 2017-09-23 LAB — BLOOD CULTURE ID PANEL (REFLEXED)
Acinetobacter baumannii: NOT DETECTED
CANDIDA KRUSEI: NOT DETECTED
Candida albicans: NOT DETECTED
Candida glabrata: NOT DETECTED
Candida parapsilosis: NOT DETECTED
Candida tropicalis: NOT DETECTED
ENTEROBACTER CLOACAE COMPLEX: NOT DETECTED
ENTEROBACTERIACEAE SPECIES: NOT DETECTED
ESCHERICHIA COLI: NOT DETECTED
Enterococcus species: NOT DETECTED
Haemophilus influenzae: NOT DETECTED
KLEBSIELLA OXYTOCA: NOT DETECTED
KLEBSIELLA PNEUMONIAE: NOT DETECTED
LISTERIA MONOCYTOGENES: NOT DETECTED
NEISSERIA MENINGITIDIS: NOT DETECTED
PROTEUS SPECIES: NOT DETECTED
PSEUDOMONAS AERUGINOSA: NOT DETECTED
STAPHYLOCOCCUS AUREUS BCID: NOT DETECTED
STAPHYLOCOCCUS SPECIES: NOT DETECTED
STREPTOCOCCUS AGALACTIAE: NOT DETECTED
STREPTOCOCCUS PNEUMONIAE: NOT DETECTED
STREPTOCOCCUS PYOGENES: NOT DETECTED
Serratia marcescens: NOT DETECTED
Streptococcus species: NOT DETECTED

## 2017-09-23 LAB — CBC WITH DIFFERENTIAL/PLATELET
BASOS PCT: 0 %
Basophils Absolute: 0 10*3/uL (ref 0.0–0.1)
Eosinophils Absolute: 0.1 10*3/uL (ref 0.0–0.7)
Eosinophils Relative: 1 %
HEMATOCRIT: 32.2 % — AB (ref 39.0–52.0)
HEMOGLOBIN: 10.9 g/dL — AB (ref 13.0–17.0)
Lymphocytes Relative: 4 %
Lymphs Abs: 0.4 10*3/uL — ABNORMAL LOW (ref 0.7–4.0)
MCH: 33.3 pg (ref 26.0–34.0)
MCHC: 33.9 g/dL (ref 30.0–36.0)
MCV: 98.5 fL (ref 78.0–100.0)
MONOS PCT: 4 %
Monocytes Absolute: 0.4 10*3/uL (ref 0.1–1.0)
NEUTROS ABS: 8.5 10*3/uL — AB (ref 1.7–7.7)
NEUTROS PCT: 91 %
Platelets: 147 10*3/uL — ABNORMAL LOW (ref 150–400)
RBC: 3.27 MIL/uL — ABNORMAL LOW (ref 4.22–5.81)
RDW: 15.1 % (ref 11.5–15.5)
WBC: 9.4 10*3/uL (ref 4.0–10.5)

## 2017-09-23 LAB — CULTURE, RESPIRATORY W GRAM STAIN

## 2017-09-23 LAB — CULTURE, RESPIRATORY: CULTURE: NORMAL

## 2017-09-23 MED ORDER — SODIUM CHLORIDE 0.9 % IV SOLN
100.0000 mg | Freq: Two times a day (BID) | INTRAVENOUS | Status: DC
  Administered 2017-09-23 – 2017-09-25 (×5): 100 mg via INTRAVENOUS
  Filled 2017-09-23 (×6): qty 100

## 2017-09-23 NOTE — Progress Notes (Signed)
PROGRESS NOTE    [name]  NOM:[id number] DOB: 1960-01-30 DOA: 09/20/2017 PCP: [name], MD    Brief Narrative:  57 year old man with past medical history relevant for hypertension, superficial DVT in left lower extremity on rivaroxaban, initially B-cell non-Hodgkin's lymphoma in 2005 with extensive therapy and then subsequently development of T-cell lymphoma recently with multiple rounds of chemotherapy including at [facility] and MD Ouida Sills who was admitted on 09/20/2017 with hypoxia and fever and thought to have pneumonia versus progression of T-cell lymphoma.   Assessment & Plan:   Principal Problem:   Acute respiratory failure (HCC) Active Problems:   Anemia, unspecified   Non Hodgkin's lymphoma ([place])   HCAP (healthcare-associated pneumonia)   Acute respiratory failure with hypoxia ([place])   #) Acute hypoxic respiratory failure: Unclear if due to pneumonia or progression of T-cell lymphoma or combination of both.  Regardless he appears to be improving. -Continue IV cefepime 09/20/2017 -Continue IV vancomycin 09/20/2017, will consider discontinuing his MRSA PCR is negative -Start IV doxycycline on 09/23/2017 - Sputum culture from 09/20/2017 no growth to date - Blood culture from 09/20/2017 no growth to date -Urine culture from 09/20/2016- -Continue prednisone 40 mill grams daily, will defer discontinuing to oncology -Discussed with pulmonary, if patient is not continue to improve we will consider bronchoscopy with or without biopsy.  #) T-cell lymphoma: This apparently has been quite troublesome for the patient.  Initially he was diagnosed with B-cell lymphoma in 2005 and was recently diagnosed with T-cell lymphoma in 2019.  He has been on and been evaluated by multiple large subspecialty groups.  He apparently is pending evaluation at [facility] for possible CAR-T therapy  -oncology consult, appreciate recommendations  #) Superficial vein thrombus: -Hold rivaroxaban for  possible bronchoscopy  #) Hypertension: -Continue atenolol 50 mg daily  #) Pain/psych: -Continue Tussionex -Continue PRN lorazepam -Continue PRN Percocet  Fluids: Tolerating p.o. Electrolyte: Monitor and supplement Nutrition: Regular diet  Prophylaxis: On rivaroxaban  Disposition: Pending improved respiratory status  Full code   Consultants:   Pulmonary  Oncology  Procedures:  Echo 09/21/2017:- Left ventricle: The cavity size was normal. Systolic function was   normal. The estimated ejection fraction was in the range of 55%   to 60%. Wall motion was normal; there were no regional wall   motion abnormalities. Left ventricular diastolic function   parameters were normal.    Antimicrobials:   IV cefepime and vancomycin started 09/20/2017  IV doxycycline started 09/23/2017   Subjective: She reports she is doing better.  He reports that he needs to have a cough but that it is improving.  He reports his hypoxia is getting better and is feeling more mobile.  He denies any nausea, vomiting, diarrhea, congestion.  Objective: Vitals:   09/22/17 1420 09/22/17 2046 09/23/17 0524 09/23/17 0528  BP: 136/86 115/83 (!) 144/88   Pulse: 85 90 93   Resp: 20 18 20    Temp: 99.1 F (37.3 C) 98.5 F (36.9 C) 98.8 F (37.1 C)   TempSrc: Oral     SpO2: 91% 92% 91%   Weight:    86.1 kg  Height:        Intake/Output Summary (Last 24 hours) at 09/23/2017 1102 Last data filed at 09/23/2017 0600 Gross per 24 hour  Intake 1958.88 ml  Output -  Net 1958.88 ml   Filed Weights   09/20/17 0918 09/20/17 2000 09/23/17 0528  Weight: 86 kg 86 kg 86.1 kg    Examination:  General  exam: Appears calm and comfortable  Respiratory system: Mildly increased work of breathing, scattered rhonchi worse on left, no crackles or wheezes Cardiovascular system: Regular rate and rhythm, no murmurs Gastrointestinal system: Abdomen is nondistended, soft and nontender. No organomegaly or masses felt.  Normal bowel sounds heard. Central nervous system: Alert and oriented. No focal neurological deficits. Extremities: Right lower extremity edema Skin: No rashes visible skin Psychiatry: Judgement and insight appear normal. Mood & affect appropriate.     Data Reviewed: I have personally reviewed following labs and imaging studies  CBC: Recent Labs  Lab 09/19/17 0911 09/20/17 0300 09/21/17 0423 09/22/17 0348 09/23/17 0436  WBC 9.8 10.9* 9.1 9.3 9.4  NEUTROABS 8.6* 9.5*  --  8.5* 8.5*  HGB 12.4* 12.3* 10.4* 10.0* 10.9*  HCT 36.2* 36.7* 30.3* 29.4* 32.2*  MCV 98.4* 98.7 98.4 98.3 98.5  PLT 131* 127* 108* 120* 335*   Basic Metabolic Panel: Recent Labs  Lab 09/19/17 0911 09/20/17 0300 09/21/17 0423 09/22/17 0348  NA 140 142 139 142  K 4.2 3.9 3.7 3.4*  CL 105 106 105 106  CO2 25 26 26 29   GLUCOSE 108* 143* 108* 121*  BUN 11 15 8 9   CREATININE 0.80 0.88 0.67 0.69  CALCIUM 9.1 8.9 7.9* 8.3*   GFR: Estimated Creatinine Clearance: 110.1 mL/min (by C-G formula based on SCr of 0.69 mg/dL). Liver Function Tests: Recent Labs  Lab 09/19/17 0911 09/20/17 0300 09/22/17 0348  AST 25 27 21   ALT 10 14 12   ALKPHOS 43 38 26*  BILITOT 0.6 0.9 0.3  PROT 6.2* 6.0* 4.9*  ALBUMIN 3.3* 3.4* 2.6*   No results for input(s): LIPASE, AMYLASE in the last 168 hours. No results for input(s): AMMONIA in the last 168 hours. Coagulation Profile: No results for input(s): INR, PROTIME in the last 168 hours. Cardiac Enzymes: No results for input(s): CKTOTAL, CKMB, CKMBINDEX, TROPONINI in the last 168 hours. BNP (last 3 results) No results for input(s): PROBNP in the last 8760 hours. HbA1C: No results for input(s): HGBA1C in the last 72 hours. CBG: No results for input(s): GLUCAP in the last 168 hours. Lipid Profile: No results for input(s): CHOL, HDL, LDLCALC, TRIG, CHOLHDL, LDLDIRECT in the last 72 hours. Thyroid Function Tests: No results for input(s): TSH, T4TOTAL, FREET4, T3FREE,  THYROIDAB in the last 72 hours. Anemia Panel: No results for input(s): VITAMINB12, FOLATE, FERRITIN, TIBC, IRON, RETICCTPCT in the last 72 hours. Sepsis Labs: Recent Labs  Lab 09/20/17 0329  LATICACIDVEN 1.17    Recent Results (from the past 240 hour(s))  Culture, blood (routine x 2)     Status: None (Preliminary result)   Collection Time: 09/20/17  3:05 AM  Result Value Ref Range Status   Specimen Description   Final    BLOOD CENTRAL LINE Performed at [facility], [place] [address] [place], [place] [address]    Special Requests   Final    BOTTLES DRAWN AEROBIC AND ANAEROBIC Blood Culture adequate volume Performed at [place] [address], [place], [place] [address]    Culture  Setup Time   Final    GRAM POSITIVE RODS Organism ID to follow CRITICAL RESULT CALLED TO, READ BACK BY AND VERIFIED WITH: Lavell Luster PHARMD [id number] 09/23/17 HMILES     Culture   Final    NO GROWTH 3 DAYS Performed at [facility], [place] [address], [place], [place] [address]    Report Status PENDING  Incomplete  Blood Culture  ID Panel (Reflexed)     Status: None   Collection Time: 09/20/17  3:05 AM  Result Value Ref Range Status   Enterococcus species NOT DETECTED NOT DETECTED Final   Listeria monocytogenes NOT DETECTED NOT DETECTED Final   Staphylococcus species NOT DETECTED NOT DETECTED Final   Staphylococcus aureus NOT DETECTED NOT DETECTED Final   Streptococcus species NOT DETECTED NOT DETECTED Final   Streptococcus agalactiae NOT DETECTED NOT DETECTED Final   Streptococcus pneumoniae NOT DETECTED NOT DETECTED Final   Streptococcus pyogenes NOT DETECTED NOT DETECTED Final   Acinetobacter baumannii NOT DETECTED NOT DETECTED Final   Enterobacteriaceae species NOT DETECTED NOT DETECTED Final   Enterobacter cloacae complex NOT DETECTED NOT DETECTED Final   Escherichia coli NOT DETECTED NOT DETECTED Final   Klebsiella oxytoca NOT DETECTED NOT DETECTED  Final   Klebsiella pneumoniae NOT DETECTED NOT DETECTED Final   Proteus species NOT DETECTED NOT DETECTED Final   Serratia marcescens NOT DETECTED NOT DETECTED Final   Haemophilus influenzae NOT DETECTED NOT DETECTED Final   Neisseria meningitidis NOT DETECTED NOT DETECTED Final   Pseudomonas aeruginosa NOT DETECTED NOT DETECTED Final   Candida albicans NOT DETECTED NOT DETECTED Final   Candida glabrata NOT DETECTED NOT DETECTED Final   Candida krusei NOT DETECTED NOT DETECTED Final   Candida parapsilosis NOT DETECTED NOT DETECTED Final   Candida tropicalis NOT DETECTED NOT DETECTED Final  Culture, blood (routine x 2)     Status: None (Preliminary result)   Collection Time: 09/20/17  3:17 AM  Result Value Ref Range Status   Specimen Description   Final    BLOOD CENTRAL LINE Performed at [facility], [place] [address] [place], [place] [address]    Special Requests   Final    BOTTLES DRAWN AEROBIC AND ANAEROBIC Blood Culture adequate volume Performed at [place] [address], [place], [place] [address]    Culture   Final    NO GROWTH 3 DAYS Performed at [facility], [place] [address] [place], [place] [address]    Report Status PENDING  Incomplete  Culture, sputum-assessment     Status: None   Collection Time: 09/20/17  9:19 AM  Result Value Ref Range Status   Specimen Description SPUTUM  Final   Special Requests NONE  Final   Sputum evaluation   Final    THIS SPECIMEN IS ACCEPTABLE FOR SPUTUM CULTURE Performed at [facility], [place] [address], [place], [place] [address]    Report Status 09/21/2017 FINAL  Final  Culture, respiratory     Status: None (Preliminary result)   Collection Time: 09/20/17  9:19 AM  Result Value Ref Range Status   Specimen Description   Final    SPUTUM Performed at [place] [address] [place], [place] [address]    Special Requests   Final    NONE Reflexed  from [phone] Performed at [place] [address], [place], [place] [address]    Gram Stain   Final    RARE WBC PRESENT, PREDOMINANTLY PMN RARE GRAM POSITIVE RODS    Culture   Final    CULTURE REINCUBATED FOR BETTER GROWTH Performed at [facility], [place] [address] [place], [place] [address]    Report Status PENDING  Incomplete  Urine culture     Status: None   Collection Time: 09/20/17  6:00 PM  Result Value Ref Range Status   Specimen Description  Final    URINE, RANDOM Performed at [facility], [place] [address], [place], [place] [address]    Special Requests   Final    NONE Performed at [facility], [place] [address] [place], [place] [address]    Culture   Final    NO GROWTH Performed at [facility], [place] [address] [place], [place] [address]    Report Status 09/22/2017 FINAL  Final  MRSA PCR Screening     Status: None   Collection Time: 09/21/17 10:30 AM  Result Value Ref Range Status   MRSA by PCR NEGATIVE NEGATIVE Final    Comment:        The GeneXpert MRSA Assay (FDA approved for NASAL specimens only), is one component of a comprehensive MRSA colonization surveillance program. It is not intended to diagnose MRSA infection nor to guide or monitor treatment for MRSA infections. Performed at [facility], [place] [address] [place] [address], [place],  [address]          Radiology Studies: Dg Chest 2 View  Result Date: 09/23/2017 CLINICAL DATA:  Cough and congestion, lymphoma EXAM: CHEST - 2 VIEW COMPARISON:  09/20/2017, 09/19/2017 FINDINGS: Similar pattern of innumerable pulmonary nodules. Slight worsening superimposed diffuse interstitial opacities may represent developing mild edema/volume overload. Trace pleural effusions. Increased basilar atelectasis. Stable heart size and vascularity. Trachea is midline. No pneumothorax. IMPRESSION: Similar pattern of innumerable pulmonary  nodules compatible with metastatic disease. Slight increased interstitial opacities concerning for developing early edema/volume overload Trace pleural effusions and basilar atelectasis. Electronically Signed   By: Jerilynn Mages.  Shick M.D.   On: 09/23/2017 09:20        Scheduled Meds: . atenolol  50 mg Oral Daily  . benzonatate  100 mg Oral TID  . chlorpheniramine-HYDROcodone  5 mL Oral Q12H  . predniSONE  40 mg Oral Q breakfast   Continuous Infusions: . sodium chloride 20 mL/hr at 09/23/17 0600  . ceFEPime (MAXIPIME) IV Stopped (09/23/17 0557)  . doxycycline (VIBRAMYCIN) IV 100 mg (09/23/17 1034)  . vancomycin 1,000 mg (09/23/17 0925)     LOS: 3 days    Time spent: [place], MD [facility]  If 7PM-7AM, please contact night-coverage [email or web address] Password [email or web address] 09/23/2017, 11:02 AM

## 2017-09-23 NOTE — Progress Notes (Signed)
Pharmacy Antibiotic Note  [name] is a 57 y.o. male admitted on 09/20/2017 with pneumonia.  PMH significant for NHL.  In the ED, patient has received Levaquin 750mg  IV x 1 dose.  Upon admission, Pharmacy has been consulted for Vancomycin dosing; MD has ordered Cefepime 1gm IV q8h.   Today, 09/23/17  Afebrile  WBC stable WNL  Renal function stable  Blood cx: gram positive rods in 1 set - nothing on BCID - ?possible contaminant  MRSA PCR negative  Plan: Day 4 Abxs 1) Continue vancomycin 1000mg  IV q12h for now (Goal AUC 400-500); however, with MRSA PCR negative as well as GPR in blood, would recommend to discontinue vancomycin 2) Continue current cefepime dosing per Md 3) Follow renal function, culture results and sensitivities   Height: 5\' 8"  (172.7 cm) Weight: 189 lb 14.4 oz (86.1 kg) IBW/kg (Calculated) : 68.4  Temp (24hrs), Avg:98.8 F (37.1 C), Min:98.5 F (36.9 C), Max:99.1 F (37.3 C)  Recent Labs  Lab 09/19/17 0911 09/20/17 0300 09/20/17 0329 09/21/17 0423 09/22/17 0348 09/23/17 0436  WBC 9.8 10.9*  --  9.1 9.3 9.4  CREATININE 0.80 0.88  --  0.67 0.69  --   LATICACIDVEN  --   --  1.17  --   --   --     Estimated Creatinine Clearance: 110.1 mL/min (by C-G formula based on SCr of 0.69 mg/dL).    Allergies  Allergen Reactions  . Penicillins Other (See Comments)    Childhood allergy reaction unknown  Has patient had a PCN reaction causing immediate rash, facial/tongue/throat swelling, SOB or lightheadedness with hypotension: Unknown Has patient had a PCN reaction causing severe rash involving mucus membranes or skin necrosis: Unknown Has patient had a PCN reaction that required hospitalization: Unknown Has patient had a PCN reaction occurring within the last 10 years: Unknown If all of the above answers are "NO", then may proceed with Cephalosporin use.     Antimicrobials this admission: 8/30 Levaquin x 1 dose  8/30 Vanc >>   8/30 Cefepime >>  Dose  adjustments this admission:    Microbiology results: 8/30 BCx: GPR 1 of 2, nothing on BCID 8/30 UCx: ngf 8/30 Sputum: rare GPR - reincubated for better growth 8/31 MRSA PCR: neg   Thank you for allowing pharmacy to be a part of this patient's care.  Kara Mead, PharmD 09/23/2017 9:36 AM

## 2017-09-23 NOTE — Progress Notes (Signed)
PT Cancellation Note  Patient Details Name: [name] MRN: [id number] DOB: 11/01/1960   Cancelled Treatment:    Reason Eval/Treat Not Completed: Patient declined, no reason specified - Pt up and ambulating with daughter upon PT arrival to room. Pt states no acute PT needs at this time, that he is mobilizing with family during the day. Will continue to follow up to determine acute physical therapy needs.   [name], PT, DPT  Pager # [phone]     [name] 09/23/2017, 3:31 PM

## 2017-09-23 NOTE — Progress Notes (Signed)
IP PROGRESS NOTE  Subjective:   [name] reports feeling better compared to hospital admission.  The cough is increased when he completes a nebulizer treatment.  He is ambulating. Objective: Vital signs in last 24 hours: Blood pressure (!) 144/88, pulse 93, temperature 98.8 F (37.1 C), resp. rate 20, height '5\' 8"'$  (1.727 m), weight 189 lb 14.4 oz (86.1 kg), SpO2 91 %.  Intake/Output from previous day: 09/01 0701 - 09/02 0700 In: 2318.9 [P.O.:1606; I.V.:20.2; IV Piggyback:692.7] Out: -   Physical Exam:  HEENT: No thrush or ulcers Lungs: Inspiratory rales at the left lower posterior chest, bilateral expiratory wheeze and rhonchi Cardiac: Regular rate and rhythm Abdomen: No hepatosplenomegaly, no mass, nontender Extremities: No leg edema Lymph nodes: Large node in the right axilla.  No cervical or supraclavicular nodes  Portacath/PICC-without erythema  Lab Results: Recent Labs    09/22/17 0348 09/23/17 0436  WBC 9.3 9.4  HGB 10.0* 10.9*  HCT 29.4* 32.2*  PLT 120* 147*    BMET Recent Labs    09/21/17 0423 09/22/17 0348  NA 139 142  K 3.7 3.4*  CL 105 106  CO2 26 29  GLUCOSE 108* 121*  BUN 8 9  CREATININE 0.67 0.69  CALCIUM 7.9* 8.3*      Medications: I have reviewed the patient's current medications.  Assessment/Plan:  1. 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  PET scan 03/20/2016 with extensive bulky hypermetabolic nodal activity in the neck, chest, abdomen and pelvis; mild splenomegaly with diffusely mildly accentuated splenic activity  Staging bone marrow  biopsy 03/23/2016-negative for involvement with lymphoma  Cycle 1 EPOCH beginning 03/23/2016  Cycle 2 [facility] 04/13/2016  Restaging PET scan at [facility] 05/08/2016-lymph nodes with various degrees of FDG activity in the neck, axilla, right written him, mesentery, pelvis, and groin. Indeterminate liver lesions without FDG activity, nodular mass in the posterior medial aspect of the left thigh  Cycle 3 EPOCH04/27/2018  Cycle 1 CVP 06/21/2016  Cytoxan/prednisone plus brentuximab 07/17/2016  Cytoxan/prednisone plus brentuximab 08/07/2016 (dose reduced)  Initiation of every three-week brentuximab08/08/2016  brentuximabdose reduced 10/31/2016 secondary to neuropathy  Brentuximab further dose reduced 01/03/2017 secondary to neuropathy  Presentation with parietal scalp nodular lesion 03/08/2017  Staging PET scan 2/84/1324-[id number] hypermetabolic lymphadenopathy in the neck, chest, abdomen/pelvis, and hypermetabolic cutaneous lesions  03/22/2017 CT biopsy left retroperitoneal nodal mass-recurrent CD30 positive T-cell lymphoma  Cycle 1 bendamustine/CPI-613on clinical trial at [facility] 04/08/2017  CTs 05/22/2017 After 2 cycles-mixed response: Enlargement of left supraclavicular node, right axillary node, right pelvic sidewall mass, decreased retroperitoneal abdominal adenopathy  Cycle 3 bendamustine/CPI-613 06/03/2017  Cycle 4 bendamustine/CPI-613 07/09/2017  CT 08/05/2017- increase in the size of a level to a lymph node, decreased size of level 3 and supraclavicular nodes, enlargement of right axillary nodes, new bilateral pulmonary nodules, decreased mediastinal, rectal peritoneal, and iliac nodes  Cycle 5 bendamustine/CPI-613 08/06/2017  Cycle 6 bendamustine/CPI-613 09/02/2017 2. 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 He entered clinical remission following chemotherapy  and remained in remission when he was seen at the cancer center 02/24/2009. 3. 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 Staging PET scan 08/23/2010 with increased FDG activity at the left tonsillar fossa and no additional evidence of lymphoma. He completed 4 cycles of R-ICE with cycle #1 beginning on 09/19/2010 and cycle #4 on 11/21/2010. Repeat head and neck examination by Dr. Constance Holster following R-ICE/rituximab showed no residual lymphoma. He began radiation consolidation on 12/18/2010, radiation was completed on 01/22/2011. 4. History of neutropenia secondary to rituximab. 5. Anemia secondary to chemotherapy, status post a red blood cell transfusion 11/30/2010. The hemoglobin has normalized. 6. History of mild thrombocytopenia secondary to chemotherapy. 7. Hypertension. 8. 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 05/08/2017-nodular mass in the left thigh adjacent to the femur  MRI 06/07/2016-infiltrative mass in the left thigh adductor muscle, separate from the schwannoma excision site  MRI 10/08/2016-marked improvement in the left adductormusclemass with a small area of enhancement remaining, no discrete mass 9. Port-A-Cath placement 03/21/2016 10. Superficial thrombus left greater saphenous vein 04/13/2016. Lovenox initiated, converted to Xarelto beginning 04/18/2016 11. 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 Admission 09/20/2017 with cough/fever/dyspnea-CT concerning for progression of lymphoma in the lungs versus pneumonia   [name] has a persistent cough and hypoxia.  The oxygen has been weaned partially.  He was placed on prednisone on admission.  Cultures are negative to date.  I discussed the case  and reviewed the chest CT images with pulmonary medicine today.  The pulmonary process is most likely secondary to lymphoma.  There may be a component of pneumonia in the left lung.  It is possible the lung nodules are related to an atypical infection.  I discussed the differential diagnosis with Mr. Zietlow and his wife.  He understands we will need to consider salvage systemic therapy options to begin soon if his presentation is related to lymphoma.  I will contact the lymphoma service at [facility] to discuss a salvage treatment plan and CAR-T therapy.   Recommendations: 1.  Continue antibiotics 2.  Consider bronchoscopy or percutaneous biopsy if clinical status not improved over the next 24 hours 3.  Repeat chest x-ray today 4.  I will contact the lymphoma service at [facility] to discuss treatment options    LOS: 3 days   [name], MD   09/23/2017, 6:36 AM

## 2017-09-23 NOTE — Progress Notes (Signed)
PHARMACY - PHYSICIAN COMMUNICATION CRITICAL VALUE ALERT - BLOOD CULTURE IDENTIFICATION (BCID)  [name] is an 57 y.o. male who presented to [facility] on 09/20/2017 with a chief complaint of Acute respiratory failure  Assessment:  GPR in gram stain, but no result in BCID (include suspected source if known)  Name of physician (or Provider) Contacted: C. Bodenheimer  Current antibiotics: Vancomycin, cefepime  Changes to prescribed antibiotics recommended:  No change in coverage at this time due to lack of BCID results Recommendations accepted by provider  Results for orders placed or performed during the hospital encounter of 09/20/17  Blood Culture ID Panel (Reflexed) (Collected: 09/20/2017  3:05 AM)  Result Value Ref Range   Enterococcus species NOT DETECTED NOT DETECTED   Listeria monocytogenes NOT DETECTED NOT DETECTED   Staphylococcus species NOT DETECTED NOT DETECTED   Staphylococcus aureus NOT DETECTED NOT DETECTED   Streptococcus species NOT DETECTED NOT DETECTED   Streptococcus agalactiae NOT DETECTED NOT DETECTED   Streptococcus pneumoniae NOT DETECTED NOT DETECTED   Streptococcus pyogenes NOT DETECTED NOT DETECTED   Acinetobacter baumannii NOT DETECTED NOT DETECTED   Enterobacteriaceae species NOT DETECTED NOT DETECTED   Enterobacter cloacae complex NOT DETECTED NOT DETECTED   Escherichia coli NOT DETECTED NOT DETECTED   Klebsiella oxytoca NOT DETECTED NOT DETECTED   Klebsiella pneumoniae NOT DETECTED NOT DETECTED   Proteus species NOT DETECTED NOT DETECTED   Serratia marcescens NOT DETECTED NOT DETECTED   Haemophilus influenzae NOT DETECTED NOT DETECTED   Neisseria meningitidis NOT DETECTED NOT DETECTED   Pseudomonas aeruginosa NOT DETECTED NOT DETECTED   Candida albicans NOT DETECTED NOT DETECTED   Candida glabrata NOT DETECTED NOT DETECTED   Candida krusei NOT DETECTED NOT DETECTED   Candida parapsilosis NOT DETECTED NOT DETECTED   Candida tropicalis NOT  DETECTED NOT DETECTED    [name] 09/23/2017  4:24 AM

## 2017-09-24 ENCOUNTER — Inpatient Hospital Stay (HOSPITAL_COMMUNITY): Payer: 59

## 2017-09-24 LAB — BASIC METABOLIC PANEL
BUN: 13 mg/dL (ref 6–20)
CO2: 27 mmol/L (ref 22–32)
Calcium: 8.1 mg/dL — ABNORMAL LOW (ref 8.9–10.3)
Chloride: 105 mmol/L (ref 98–111)
Creatinine, Ser: 0.63 mg/dL (ref 0.61–1.24)
Glucose, Bld: 107 mg/dL — ABNORMAL HIGH (ref 70–99)

## 2017-09-24 LAB — CBC
HCT: 29.5 % — ABNORMAL LOW (ref 39.0–52.0)
Hemoglobin: 10 g/dL — ABNORMAL LOW (ref 13.0–17.0)
MCH: 33 pg (ref 26.0–34.0)
MCHC: 33.9 g/dL (ref 30.0–36.0)
MCV: 97.4 fL (ref 78.0–100.0)
Platelets: 123 K/uL — ABNORMAL LOW (ref 150–400)
RBC: 3.03 MIL/uL — ABNORMAL LOW (ref 4.22–5.81)
RDW: 14.7 % (ref 11.5–15.5)
WBC: 7.7 K/uL (ref 4.0–10.5)

## 2017-09-24 LAB — BASIC METABOLIC PANEL WITH GFR
Anion gap: 8 (ref 5–15)
GFR calc Af Amer: >60 mL/min (ref >60)
GFR calc non Af Amer: >60 mL/min (ref >60)
Potassium: 3.4 mmol/L — ABNORMAL LOW (ref 3.5–5.1)
Sodium: 140 mmol/L (ref 135–145)

## 2017-09-24 MED ORDER — POTASSIUM CHLORIDE CRYS ER 20 MEQ PO TBCR
40.0000 meq | EXTENDED_RELEASE_TABLET | Freq: Once | ORAL | Status: AC
  Administered 2017-09-24: 40 meq via ORAL
  Filled 2017-09-24: qty 2

## 2017-09-24 NOTE — Progress Notes (Signed)
Physical Therapy Treatment Patient Details Name: [name] MRN: [id number] DOB: 1960-04-24 Today's Date: 09/24/2017    History of Present Illness Pt is a 57 YO male admitted 8/30 for acute respiratory failure, symptoms including fevers, SOB, wheezing. Pt with nonhodgkins lymphoma dx in 2018. PMh includes essential tremor, diffuse large B cell lymphoma diagnosed 2005.     PT Comments    Pt with improved ambulation distance today, as pt states he has been walking with family. Pt required 3 standing rest breaks during session today, O2sats dropping to high 80s with symptoms of dyspnea. Pt self-recovered O2sats with deep breathing through nose. Pt with some LE weakness today upon exam. Pt given LE exercises to maintain strength while in the hospital. Both pt and wife agree that pt would like to continue to receive pt during hospital stay due to dyspnea on exertion and to maintain LE strength. Will continue to follow acutely, pt's d/c plan to return home with no PT follow up remains appropriate.    Follow Up Recommendations  No PT follow up     Equipment Recommendations  None recommended by PT    Recommendations for Other Services       Precautions / Restrictions Precautions Precautions: None    Mobility  Bed Mobility               General bed mobility comments: Pt up in recliner upon entry to room   Transfers Overall transfer level: Modified independent Equipment used: None             General transfer comment: Increased effort for standing, no AD used   Ambulation/Gait Ambulation/Gait assistance: Supervision Gait Distance (Feet): 800 Feet(3 standing rest breaks, once at 250 ft, once at 600 ft, and once at 700 ft) Assistive device: IV Pole Gait Pattern/deviations: Step-through pattern;Decreased stride length Gait velocity: normal    General Gait Details: did not rely on IV pole for support or steadying. 3 standing rest breaks during sesion based on pt's symptoms  of dyspnea. O2sats 86-92% on 2LO2. O2sats in the mid-to-high 80s corresponded with symptoms of dyspnea.   Stairs             Wheelchair Mobility    Modified Rankin (Stroke Patients Only)       Balance Overall balance assessment: Modified Independent                                          Cognition Arousal/Alertness: Awake/alert Behavior During Therapy: WFL for tasks assessed/performed Overall Cognitive Status: Within Functional Limits for tasks assessed                                        Exercises General Exercises - Lower Extremity Hip Flexion/Marching: AROM;10 reps;Both;Standing Other Exercises Other Exercises: Instructed in standing hip flexion, hip abduction, and hip extension in standing with use of countertop for steadying. PT demonstrated to pt. Other Exercises: Figure 4 stretch bilaterally, 15-30 second hold bilat.  Other Exercises: Seated piriformis stretch, 15-30 second hold bilat.     General Comments General comments (skin integrity, edema, etc.): MMT: 4/5 hip flexion, knee flexion, knee extension bilaterally. 5/5 hip abduction/adduction.      Pertinent Vitals/Pain Pain Assessment: Faces Faces Pain Scale: No hurt    Home Living  Prior Function            PT Goals (current goals can now be found in the care plan section) Acute Rehab PT Goals PT Goal Formulation: With patient Time For Goal Achievement: 10/04/17 Potential to Achieve Goals: Good Progress towards PT goals: Progressing toward goals    Frequency    Min 3X/week      PT Plan Current plan remains appropriate    Co-evaluation              AM-PAC PT "6 Clicks" Daily Activity  Outcome Measure  Difficulty turning over in bed (including adjusting bedclothes, sheets and blankets)?: None Difficulty moving from lying on back to sitting on the side of the bed? : None Difficulty sitting down on and standing up  from a chair with arms (e.g., wheelchair, bedside commode, etc,.)?: None Help needed moving to and from a bed to chair (including a wheelchair)?: None Help needed walking in hospital room?: A Little Help needed climbing 3-5 steps with a railing? : A Little 6 Click Score: 22    End of Session Equipment Utilized During Treatment: Oxygen(2LO2 via [place] with ambulation ) Activity Tolerance: Patient tolerated treatment well Patient left: in chair;with call bell/phone within reach;with family/visitor present Nurse Communication: Mobility status PT Visit Diagnosis: Difficulty in walking, not elsewhere classified (R26.2)     Time: 3009-2330 PT Time Calculation (min) (ACUTE ONLY): 23 min  Charges:  $Gait Training: 8-22 mins $Therapeutic Exercise: 8-22 mins                     Quientin Jent Conception [name], PT, DPT  Pager # [phone]    [place] 09/24/2017, 1:47 PM

## 2017-09-24 NOTE — Care Management Note (Signed)
Case Management Note  Patient Details  Name: [name] MRN: [id number] Date of Birth: April 26, 1960  Subjective/Objective: Admitted w/Acute respiratory failure. Hx: NHL. From home. Oncology/IR following-possible bronchoscopy or bx. On 02-will monitor if needed @ home.                  Action/Plan:d/c plan home.   Expected Discharge Date:  (unknown)               Expected Discharge Plan:  Home/Self Care  In-House Referral:     Discharge planning Services  CM Consult  Post Acute Care Choice:    Choice offered to:     DME Arranged:    DME Agency:     HH Arranged:    HH Agency:     Status of Service:  In process, will continue to follow  If discussed at Long Length of Stay Meetings, dates discussed:    Additional Comments:  Dessa Phi, RN 09/24/2017, 11:28 AM

## 2017-09-24 NOTE — Progress Notes (Signed)
IP PROGRESS NOTE  Subjective:   [name] continues to feel better.  Less dyspnea.  [name] is eating.  No sweats. Objective: Vital signs in last 24 hours: Blood pressure (!) 144/87, pulse 87, temperature 99.1 F (37.3 C), temperature source Oral, resp. rate 20, height _0  (1.727 m), weight 189 lb 14.4 oz (86.1 kg), SpO2 92 %.  Intake/Output from previous day: 09/02 0701 - 09/03 0700 In: 1479.2 [I.V.:331.6; IV Piggyback:1147.6] Out: -   Physical Exam:  HEENT: No thrush or ulcers Lungs: Inspiratory rales at the left lower posterior chest Cardiac: Regular rate and rhythm Abdomen: No hepatosplenomegaly, no mass, nontender Extremities: No leg edema   Portacath/PICC-without erythema  Lab Results: Recent Labs    09/23/17 0436 09/24/17 0311  WBC 9.4 7.7  HGB 10.9* 10.0*  HCT 32.2* 29.5*  PLT 147* 123*    BMET Recent Labs    09/22/17 0348 09/24/17 0311  NA 142 140  K 3.4* 3.4*  CL 106 105  CO2 29 27  GLUCOSE 121* 107*  BUN 9 13  CREATININE 0.69 0.63  CALCIUM 8.3* 8.1*      Medications: I have reviewed the patient's current medications.  Assessment/Plan:  1. 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  PET scan 03/20/2016 with extensive bulky hypermetabolic nodal activity in the neck, chest, abdomen and pelvis; mild splenomegaly with diffusely mildly accentuated splenic activity  Staging bone marrow biopsy 03/23/2016-negative for involvement with lymphoma  Cycle 1 EPOCH beginning 03/23/2016  Cycle 2 [facility] 04/13/2016  Restaging PET scan at [facility] 05/08/2016-lymph  nodes with various degrees of FDG activity in the neck, axilla, right written him, mesentery, pelvis, and groin. Indeterminate liver lesions without FDG activity, nodular mass in the posterior medial aspect of the left thigh  Cycle 3 EPOCH04/27/2018  Cycle 1 CVP 06/21/2016  Cytoxan/prednisone plus brentuximab 07/17/2016  Cytoxan/prednisone plus brentuximab 08/07/2016 (dose reduced)  Initiation of every three-week brentuximab08/08/2016  brentuximabdose reduced 10/31/2016 secondary to neuropathy  Brentuximab further dose reduced 01/03/2017 secondary to neuropathy  Presentation with parietal scalp nodular lesion 03/08/2017  Staging PET scan 1/61/0960-AVWUJWJ hypermetabolic lymphadenopathy in the neck, chest, abdomen/pelvis, and hypermetabolic cutaneous lesions  03/22/2017 CT biopsy left retroperitoneal nodal mass-recurrent CD30 positive T-cell lymphoma  Cycle 1 bendamustine/CPI-613on clinical trial at [facility] 04/08/2017  CTs 05/22/2017 After 2 cycles-mixed response: Enlargement of left supraclavicular node, right axillary node, right pelvic sidewall mass, decreased retroperitoneal abdominal adenopathy  Cycle 3 bendamustine/CPI-613 06/03/2017  Cycle 4 bendamustine/CPI-613 07/09/2017  CT 08/05/2017- increase in the size of a level to a lymph node, decreased size of level 3 and supraclavicular nodes, enlargement of right axillary nodes, new bilateral pulmonary nodules, decreased mediastinal, rectal peritoneal, and iliac nodes  Cycle 5 bendamustine/CPI-613 08/06/2017  Cycle 6 bendamustine/CPI-613 09/02/2017 2. 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 [name] entered clinical remission following chemotherapy and remained in remission when [name] was seen at the cancer center 02/24/2009. 3. Recurrent large B-cell lymphoma involving a left pharynx mass July 2012, status post a biopsy  08/11/2010 confirming a diffuse large B-cell lymphoma, CD20 positive, IIA. Staging PET scan 08/23/2010 with  increased FDG activity at the left tonsillar fossa and no additional evidence of lymphoma. [name] completed 4 cycles of R-ICE with cycle #1 beginning on 09/19/2010 and cycle #4 on 11/21/2010. Repeat head and neck examination by Dr. Constance Holster following R-ICE/rituximab showed no residual lymphoma. [name] began radiation consolidation on 12/18/2010, radiation was completed on 01/22/2011. 4. History of neutropenia secondary to rituximab. 5. Anemia secondary to chemotherapy, status post a red blood cell transfusion 11/30/2010. The hemoglobin has normalized. 6. History of mild thrombocytopenia secondary to chemotherapy. 7. Hypertension. 8. 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 05/08/2017-nodular mass in the left thigh adjacent to the femur  MRI 06/07/2016-infiltrative mass in the left thigh adductor muscle, separate from the schwannoma excision site  MRI 10/08/2016-marked improvement in the left adductormusclemass with a small area of enhancement remaining, no discrete mass 9. Port-A-Cath placement 03/21/2016 10. Superficial thrombus left greater saphenous vein 04/13/2016. Lovenox initiated, converted to Xarelto beginning 04/18/2016 11. 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 Admission 09/20/2017 with cough/fever/dyspnea-CT concerning for progression of lymphoma in the lungs versus pneumonia   [name] appears unchanged today.  The oxygen has been weaned partially over the past few days.  Chest x-rays over the past few days have not changed significantly.  I discussed the case with the lymphoma service at [facility].  [name] is being scheduled for an appointment to discuss CAR-T therapy.  Salvage therapy with DHAP or gemcitabine/cisplatin is recommended  prior to CAR-T therapy.  I contacted after Reita Chard at [facility] .  She will see him within the next few weeks.  I discussed biopsy plans with interventional radiology this morning.  They do not feel there is a targetable lesion in the chest to biopsy via a percutaneous approach.  I will contact Dr. Lamonte Sakai to discuss bronchoscopy.  The alternative is to proceed with empiric salvage chemotherapy given the high likelihood of the clinical presentation being related to lymphoma.  A blood culture from the Port-A-Cath 09/20/2017 revealed a gram-positive rod.  This is likely a contaminant.  We will wait on the organism identification.      Recommendations: 1.  Continue antibiotics, follow-up identification of gram-positive rod from the Port-A-Cath blood culture 09/20/2017 2.  Discuss bronchoscopy/bronchoscopic biopsy with pulmonary medicine 3.  Salvage chemotherapy to begin within the next few days 4.  Refer to lymphoma service at [facility] 5.  Continue oxygen    LOS: 4 days   [name], MD   09/24/2017, 7:19 AM

## 2017-09-24 NOTE — Progress Notes (Addendum)
PROGRESS NOTE    [name]  KNL:[id number] DOB: May 19, 1960 DOA: 09/20/2017 PCP: [name], MD    Brief Narrative:  57 year old man with past medical history relevant for hypertension, superficial DVT in left lower extremity on rivaroxaban, initially B-cell non-Hodgkin's lymphoma in 2005 with extensive therapy and then subsequently development of T-cell lymphoma recently with multiple rounds of chemotherapy including at [facility] and MD Ouida Sills who was admitted on 09/20/2017 with hypoxia and fever and thought to have pneumonia versus progression of T-cell lymphoma.   Assessment & Plan:   Principal Problem:   Acute respiratory failure (HCC) Active Problems:   Anemia, unspecified   Non Hodgkin's lymphoma ([place])   HCAP (healthcare-associated pneumonia)   Acute respiratory failure with hypoxia ([place])   #) Acute hypoxic respiratory failure: Unclear if due to pneumonia or progression of T-cell lymphoma or combination of both.  He appears to have had some desaturations last night and required more oxygen that is now subsequently being weaned again. -Repeat chest x-ray -Continue IV cefepime 09/20/2017 -discontinue IV vancomycin 09/20/2017-09/24/2017 as MRSA PCR is negative -Continue IV doxycycline on 09/23/2017 - Sputum culture from 09/20/2017 no growth to date - Blood culture from 09/20/2017 no growth to date -Urine culture from 09/20/2016- -Continue prednisone 40 mill grams daily, will defer discontinuing to oncology -Discussed with pulmonary, and interventional radiology as his CT scan does show a large round opacity in the lung that might be amenable to percutaneous biopsy versus bronchoscopy  #) T-cell lymphoma: This apparently has been quite troublesome for the patient.  Initially he was diagnosed with B-cell lymphoma in 2005 and was recently diagnosed with T-cell lymphoma in 2019.  He has been on and been evaluated by multiple large subspecialty groups.  He apparently is  pending evaluation at [facility] for possible CAR-T therapy  -oncology consult, appreciate recommendations  #) Superficial vein thrombus: -Hold rivaroxaban for possible bronchoscopy versus biopsy  #) Hypertension: -Continue atenolol 50 mg daily  #) Pain/psych: -Continue Tussionex -Continue PRN lorazepam -Continue PRN Percocet  Fluids: Tolerating p.o. Electrolyte: Monitor and supplement Nutrition: Regular diet  Prophylaxis:  ambulatory  Disposition: Pending improved respiratory status and bronchoscopy versus biopsy  Full code   Consultants:   Pulmonary  Oncology  Procedures:  Echo 09/21/2017:- Left ventricle: The cavity size was normal. Systolic function was   normal. The estimated ejection fraction was in the range of 55%   to 60%. Wall motion was normal; there were no regional wall   motion abnormalities. Left ventricular diastolic function   parameters were normal.    Antimicrobials:   IV cefepime started 09/20/2017  IV vancomycin started 09/20/2017 to 09/24/17  IV doxycycline started 09/23/2017   Subjective: Patient reports he is doing better but last night he did have an episode of desaturation and required another 5 L of oxygen.  He is now been weaned to 3 L.  He reports his cough is relatively unchanged but clinically he feels better.  He denies any nausea, vomiting, diarrhea, abdominal pain, chest pain.  Objective: Vitals:   09/24/17 0200 09/24/17 0425 09/24/17 0509 09/24/17 1035  BP:   (!) 144/87   Pulse:   87   Resp:   20   Temp:   99.1 F (37.3 C)   TempSrc:   Oral   SpO2: 90% 92% 92% 92%  Weight:      Height:        Intake/Output Summary (Last 24 hours) at 09/24/2017 1055 Last data filed at 09/24/2017  7169 Gross per 24 hour  Intake 1536.31 ml  Output -  Net 1536.31 ml   Filed Weights   09/20/17 0918 09/20/17 2000 09/23/17 0528  Weight: 86 kg 86 kg 86.1 kg    Examination:  General exam: Appears calm and comfortable  Respiratory system: No  increased work of breathing, scattered rhonchi worse on left, no crackles or wheezes Cardiovascular system: Regular rate and rhythm, no murmurs Gastrointestinal system: Abdomen is nondistended, soft and nontender. No organomegaly or masses felt. Normal bowel sounds heard. Central nervous system: Alert and oriented. No focal neurological deficits. Extremities: Trace lower extremity edema Skin: No rashes visible skin Psychiatry: Judgement and insight appear normal. Mood & affect appropriate.     Data Reviewed: I have personally reviewed following labs and imaging studies  CBC: Recent Labs  Lab 09/19/17 0911 09/20/17 0300 09/21/17 0423 09/22/17 0348 09/23/17 0436 09/24/17 0311  WBC 9.8 10.9* 9.1 9.3 9.4 7.7  NEUTROABS 8.6* 9.5*  --  8.5* 8.5*  --   HGB 12.4* 12.3* 10.4* 10.0* 10.9* 10.0*  HCT 36.2* 36.7* 30.3* 29.4* 32.2* 29.5*  MCV 98.4* 98.7 98.4 98.3 98.5 97.4  PLT 131* 127* 108* 120* 147* 678*   Basic Metabolic Panel: Recent Labs  Lab 09/19/17 0911 09/20/17 0300 09/21/17 0423 09/22/17 0348 09/24/17 0311  NA 140 142 139 142 140  K 4.2 3.9 3.7 3.4* 3.4*  CL 105 106 105 106 105  CO2 25 26 26 29 27   GLUCOSE 108* 143* 108* 121* 107*  BUN 11 15 8 9 13   CREATININE 0.80 0.88 0.67 0.69 0.63  CALCIUM 9.1 8.9 7.9* 8.3* 8.1*   GFR: Estimated Creatinine Clearance: 110.1 mL/min (by C-G formula based on SCr of 0.63 mg/dL). Liver Function Tests: Recent Labs  Lab 09/19/17 0911 09/20/17 0300 09/22/17 0348  AST 25 27 21   ALT 10 14 12   ALKPHOS 43 38 26*  BILITOT 0.6 0.9 0.3  PROT 6.2* 6.0* 4.9*  ALBUMIN 3.3* 3.4* 2.6*   No results for input(s): LIPASE, AMYLASE in the last 168 hours. No results for input(s): AMMONIA in the last 168 hours. Coagulation Profile: No results for input(s): INR, PROTIME in the last 168 hours. Cardiac Enzymes: No results for input(s): CKTOTAL, CKMB, CKMBINDEX, TROPONINI in the last 168 hours. BNP (last 3 results) No results for input(s): PROBNP  in the last 8760 hours. HbA1C: No results for input(s): HGBA1C in the last 72 hours. CBG: No results for input(s): GLUCAP in the last 168 hours. Lipid Profile: No results for input(s): CHOL, HDL, LDLCALC, TRIG, CHOLHDL, LDLDIRECT in the last 72 hours. Thyroid Function Tests: No results for input(s): TSH, T4TOTAL, FREET4, T3FREE, THYROIDAB in the last 72 hours. Anemia Panel: No results for input(s): VITAMINB12, FOLATE, FERRITIN, TIBC, IRON, RETICCTPCT in the last 72 hours. Sepsis Labs: Recent Labs  Lab 09/20/17 0329  LATICACIDVEN 1.17    Recent Results (from the past 240 hour(s))  Culture, blood (routine x 2)     Status: None (Preliminary result)   Collection Time: 09/20/17  3:05 AM  Result Value Ref Range Status   Specimen Description   Final    BLOOD CENTRAL LINE Performed at [facility], [place] [address] [place], [place] [address]    Special Requests   Final    BOTTLES DRAWN AEROBIC AND ANAEROBIC Blood Culture adequate volume Performed at [place] [address] [place],  [address]    Culture  Setup Time   Final  GRAM POSITIVE RODS Organism ID to follow CRITICAL RESULT CALLED TO, READ BACK BY AND VERIFIED WITH: Lavell Luster PHARMD [id number] 09/23/17 HMILES     Culture   Final    NO GROWTH 3 DAYS Performed at [facility], [place] [address] [place], [place] [address]    Report Status PENDING  Incomplete  Blood Culture ID Panel (Reflexed)     Status: None   Collection Time: 09/20/17  3:05 AM  Result Value Ref Range Status   Enterococcus species NOT DETECTED NOT DETECTED Final   Listeria monocytogenes NOT DETECTED NOT DETECTED Final   Staphylococcus species NOT DETECTED NOT DETECTED Final   Staphylococcus aureus NOT DETECTED NOT DETECTED Final   Streptococcus species NOT DETECTED NOT DETECTED Final   Streptococcus agalactiae NOT DETECTED NOT DETECTED Final   Streptococcus pneumoniae NOT DETECTED NOT DETECTED Final    Streptococcus pyogenes NOT DETECTED NOT DETECTED Final   Acinetobacter baumannii NOT DETECTED NOT DETECTED Final   Enterobacteriaceae species NOT DETECTED NOT DETECTED Final   Enterobacter cloacae complex NOT DETECTED NOT DETECTED Final   Escherichia coli NOT DETECTED NOT DETECTED Final   Klebsiella oxytoca NOT DETECTED NOT DETECTED Final   Klebsiella pneumoniae NOT DETECTED NOT DETECTED Final   Proteus species NOT DETECTED NOT DETECTED Final   Serratia marcescens NOT DETECTED NOT DETECTED Final   Haemophilus influenzae NOT DETECTED NOT DETECTED Final   Neisseria meningitidis NOT DETECTED NOT DETECTED Final   Pseudomonas aeruginosa NOT DETECTED NOT DETECTED Final   Candida albicans NOT DETECTED NOT DETECTED Final   Candida glabrata NOT DETECTED NOT DETECTED Final   Candida krusei NOT DETECTED NOT DETECTED Final   Candida parapsilosis NOT DETECTED NOT DETECTED Final   Candida tropicalis NOT DETECTED NOT DETECTED Final  Culture, blood (routine x 2)     Status: None (Preliminary result)   Collection Time: 09/20/17  3:17 AM  Result Value Ref Range Status   Specimen Description   Final    BLOOD CENTRAL LINE Performed at [facility], [place] [address] [place], [place] [address]    Special Requests   Final    BOTTLES DRAWN AEROBIC AND ANAEROBIC Blood Culture adequate volume Performed at [place] [address] [place], [place] [address]    Culture   Final    NO GROWTH 3 DAYS Performed at [facility], [place] [address], [place], [place] [address]    Report Status PENDING  Incomplete  Culture, sputum-assessment     Status: None   Collection Time: 09/20/17  9:19 AM  Result Value Ref Range Status   Specimen Description SPUTUM  Final   Special Requests NONE  Final   Sputum evaluation   Final    THIS SPECIMEN IS ACCEPTABLE FOR SPUTUM CULTURE Performed at [facility], [place] [address] [place], [place] [address]    Report  Status 09/21/2017 FINAL  Final  Culture, respiratory     Status: None   Collection Time: 09/20/17  9:19 AM  Result Value Ref Range Status   Specimen Description   Final    SPUTUM Performed at [place] [address], [place], [place] [address]    Special Requests   Final    NONE Reflexed from [phone] Performed at [place] [address] [place], [place] [address]    Gram Stain   Final    RARE WBC PRESENT, PREDOMINANTLY PMN RARE GRAM POSITIVE RODS  Culture   Final    Consistent with normal respiratory flora. Performed at [facility], [place] [address], [place], [place] [address]    Report Status 09/23/2017 FINAL  Final  Urine culture     Status: None   Collection Time: 09/20/17  6:00 PM  Result Value Ref Range Status   Specimen Description   Final    URINE, RANDOM Performed at [place] [address], [place], [place] [address]    Special Requests   Final    NONE Performed at [facility], [place] [address], [place], [place] [address]    Culture   Final    NO GROWTH Performed at [facility], [place] [address], [place], [place] [address]    Report Status 09/22/2017 FINAL  Final  MRSA PCR Screening     Status: None   Collection Time: 09/21/17 10:30 AM  Result Value Ref Range Status   MRSA by PCR NEGATIVE NEGATIVE Final    Comment:        The GeneXpert MRSA Assay (FDA approved for NASAL specimens only), is one component of a comprehensive MRSA colonization surveillance program. It is not intended to diagnose MRSA infection nor to guide or monitor treatment for MRSA infections. Performed at [facility], [place] [address] [place], [place] [address]          Radiology Studies: Dg Chest 2 View  Result Date: 09/23/2017 CLINICAL DATA:  Cough and congestion, lymphoma EXAM: CHEST - 2 VIEW COMPARISON:  09/20/2017, 09/19/2017 FINDINGS: Similar pattern of  innumerable pulmonary nodules. Slight worsening superimposed diffuse interstitial opacities may represent developing mild edema/volume overload. Trace pleural effusions. Increased basilar atelectasis. Stable heart size and vascularity. Trachea is midline. No pneumothorax. IMPRESSION: Similar pattern of innumerable pulmonary nodules compatible with metastatic disease. Slight increased interstitial opacities concerning for developing early edema/volume overload Trace pleural effusions and basilar atelectasis. Electronically Signed   By: Jerilynn Mages.  Shick M.D.   On: 09/23/2017 09:20        Scheduled Meds: . atenolol  50 mg Oral Daily  . benzonatate  100 mg Oral TID  . chlorpheniramine-HYDROcodone  5 mL Oral Q12H  . predniSONE  40 mg Oral Q breakfast   Continuous Infusions: . sodium chloride Stopped (09/24/17 0543)  . ceFEPime (MAXIPIME) IV 200 mL/hr at 09/24/17 0600  . doxycycline (VIBRAMYCIN) IV 100 mg (09/24/17 0953)     LOS: 4 days    Time spent: [place], MD [facility]  If 7PM-7AM, please contact night-coverage [email or web address] Password [email or web address] 09/24/2017, 10:55 AM

## 2017-09-25 ENCOUNTER — Other Ambulatory Visit: Payer: Self-pay | Admitting: Oncology

## 2017-09-25 ENCOUNTER — Ambulatory Visit: Payer: Self-pay | Admitting: Oncology

## 2017-09-25 DIAGNOSIS — J9601 Acute respiratory failure with hypoxia: Secondary | ICD-10-CM

## 2017-09-25 DIAGNOSIS — J189 Pneumonia, unspecified organism: Secondary | ICD-10-CM

## 2017-09-25 DIAGNOSIS — C8511 Unspecified B-cell lymphoma, lymph nodes of head, face, and neck: Secondary | ICD-10-CM

## 2017-09-25 LAB — CBC WITH DIFFERENTIAL/PLATELET
Basophils Absolute: 0 K/uL (ref 0.0–0.1)
Basophils Relative: 0 %
Eosinophils Absolute: 0.1 K/uL (ref 0.0–0.7)
Eosinophils Relative: 1 %
HCT: 29.5 % — ABNORMAL LOW (ref 39.0–52.0)
Hemoglobin: 10.1 g/dL — ABNORMAL LOW (ref 13.0–17.0)
Lymphocytes Relative: 5 %
Lymphs Abs: 0.3 10*3/uL — ABNORMAL LOW (ref 0.7–4.0)
MCH: 33.3 pg (ref 26.0–34.0)
MCHC: 34.2 g/dL (ref 30.0–36.0)
MCV: 97.4 fL (ref 78.0–100.0)
Monocytes Absolute: 0.3 10*3/uL (ref 0.1–1.0)
Monocytes Relative: 6 %
Neutro Abs: 4.9 K/uL (ref 1.7–7.7)
Neutrophils Relative %: 88 %
Platelets: 97 10*3/uL — ABNORMAL LOW (ref 150–400)
RBC: 3.03 MIL/uL — ABNORMAL LOW (ref 4.22–5.81)
RDW: 14.9 % (ref 11.5–15.5)
WBC: 5.6 K/uL (ref 4.0–10.5)

## 2017-09-25 LAB — CULTURE, BLOOD (ROUTINE X 2)
Culture: NO GROWTH
SPECIAL REQUESTS: ADEQUATE
Special Requests: ADEQUATE

## 2017-09-25 LAB — BASIC METABOLIC PANEL
BUN: 13 mg/dL (ref 6–20)
CO2: 25 mmol/L (ref 22–32)
Calcium: 7.9 mg/dL — ABNORMAL LOW (ref 8.9–10.3)
GFR calc Af Amer: >60 mL/min (ref >60)
GFR calc non Af Amer: >60 mL/min (ref >60)
Glucose, Bld: 89 mg/dL (ref 70–99)

## 2017-09-25 LAB — BASIC METABOLIC PANEL WITH GFR
Anion gap: 8 (ref 5–15)
Chloride: 106 mmol/L (ref 98–111)
Creatinine, Ser: 0.59 mg/dL — ABNORMAL LOW (ref 0.61–1.24)
Potassium: 3.3 mmol/L — ABNORMAL LOW (ref 3.5–5.1)
Sodium: 139 mmol/L (ref 135–145)

## 2017-09-25 MED ORDER — RIVAROXABAN 20 MG PO TABS
20.0000 mg | ORAL_TABLET | Freq: Every day | ORAL | Status: DC
Start: 2017-09-26 — End: 2017-09-25

## 2017-09-25 MED ORDER — LEVOFLOXACIN 750 MG PO TABS
750.0000 mg | ORAL_TABLET | Freq: Every day | ORAL | Status: DC
  Administered 2017-09-25 – 2017-09-26 (×2): 750 mg via ORAL
  Filled 2017-09-25 (×2): qty 1

## 2017-09-25 MED ORDER — POTASSIUM CHLORIDE CRYS ER 20 MEQ PO TBCR
40.0000 meq | EXTENDED_RELEASE_TABLET | Freq: Once | ORAL | Status: AC
  Administered 2017-09-25: 40 meq via ORAL
  Filled 2017-09-25: qty 2

## 2017-09-25 MED ORDER — RIVAROXABAN 20 MG PO TABS
20.0000 mg | ORAL_TABLET | Freq: Every day | ORAL | Status: DC
  Administered 2017-09-25 – 2017-09-26 (×2): 20 mg via ORAL
  Filled 2017-09-25 (×2): qty 1

## 2017-09-25 NOTE — Progress Notes (Signed)
ANTICOAGULATION CONSULT NOTE - Initial Consult  Pharmacy Consult for Xarelto Indication: superficial vein thrombus  Allergies  Allergen Reactions  . Penicillins Other (See Comments)    Childhood allergy reaction unknown  Has patient had a PCN reaction causing immediate rash, facial/tongue/throat swelling, SOB or lightheadedness with hypotension: Unknown Has patient had a PCN reaction causing severe rash involving mucus membranes or skin necrosis: Unknown Has patient had a PCN reaction that required hospitalization: Unknown Has patient had a PCN reaction occurring within the last 10 years: Unknown If all of the above answers are "NO", then may proceed with Cephalosporin use.     Patient Measurements: Height: 5\' 8"  (172.7 cm) Weight: 189 lb 14.4 oz (86.1 kg) IBW/kg (Calculated) : 68.4  Vital Signs: Temp: 99.1 F (37.3 C) (09/04 1357) Temp Source: Oral (09/04 1357) BP: 146/97 (09/04 1357) Pulse Rate: 79 (09/04 1357)  Labs: Recent Labs    09/23/17 0436 09/24/17 0311 09/25/17 0414  HGB 10.9* 10.0* 10.1*  HCT 32.2* 29.5* 29.5*  PLT 147* 123* 97*  CREATININE  --  0.63 0.59*    Estimated Creatinine Clearance: 110.1 mL/min (A) (by C-G formula based on SCr of 0.59 mg/dL (L)).   Medical History: Past Medical History:  Diagnosis Date  . History of radiation therapy 12/18/10 to 01/22/11   left oropharynx  . Hypertension   . Neutropenia    Secondary to Rituxan  . Non Hodgkin's lymphoma ([place])    s/p 4 cycles R-ICE, start 11/21/10  . T-cell lymphoma ([place]) 03/14/2016    Medications:  Scheduled:  . atenolol  50 mg Oral Daily  . benzonatate  100 mg Oral TID  . chlorpheniramine-HYDROcodone  5 mL Oral Q12H  . levofloxacin  750 mg Oral Daily  . potassium chloride  40 mEq Oral Once   Infusions:  . sodium chloride Stopped (09/24/17 0543)    Assessment: 32 yoM admitted on 8/30 with PMH significant for superficial vein thrombus on Xarelto.  Last dose taken prior to  admission on 8/29 at 0800, resumed inpatient on 8/30-9/1 then held on 9/2 for biopsy.  Pharmacy is consulted to resume Xarelto dosing.  SCr 0.59, CrCl > 100 ml/min CBC: Hgb 10.1, Plt 97   Goal of Therapy:  Monitor platelets by anticoagulation protocol: Yes   Plan:   Xarelto 20mg  PO daily with breakfast.  Pharmacy to sign off note writing, but will follow up peripherally for renal function.  Gretta Arab PharmD, BCPS Pager [phone] 09/25/2017 6:11 PM

## 2017-09-25 NOTE — Progress Notes (Signed)
PROGRESS NOTE [facility]   STEPHEN BARUCH   EHM:[id number] DOB: 1960/03/14  DOA: 09/20/2017 PCP: [name], MD   Brief Narrative:  [name] is a 57 year old man with past medical history relevant for hypertension, superficial DVT in left lower extremity on rivaroxaban, initially B-cell non-Hodgkin's lymphoma in 2005 with extensive therapy and then subsequently development of T-cell lymphoma recently with multiple rounds of chemotherapy including at [facility] and [facility] who was admitted on 09/20/2017 with hypoxia and fever and thought to have pneumonia versus progression of T-cell lymphoma.  Subjective: Patient seen and examined, remains on oxygen.  Reported improvement of breathing.  Continues to feel short of breath with activity.  Assessment & Plan: Acute hypoxic respiratory failure Multifactorial from lymphoma and superimposed pneumonia. Patient treated with IV cefepime and vancomycin, this was discontinued as MRSA PCR was negative on 9/3.  Patient started on doxycycline on 9/2 for atypical coverage.  Sputum, urine cultures no growth to date.  Patient on prednisone 40 mg daily.  Case was discussed with IR and pulmonology for possible biopsy however oncology and patient has postponed this as there is no further benefit.  Wean oxygen as able.  Patient will start chemotherapy next week.  Switch to Levaquin to complete treatment for 7 days.  If patient remains stable in a.m. will DC home.  T-cell lymphoma Continue management per oncology  Superficial vein thrombosis Patient on Xarelto, was held for possible biopsy, okay to resume tonight.  Hypertension BP stable Continue atenolol 50 mg daily  DVT prophylaxis: Xarelto  Code Status: Full Code  Family Communication: None at bedside  Disposition Plan: Home in am if respiratory status stable   Consultants:   Oncology   Procedures:   None   Antimicrobials: Anti-infectives (From admission, onward)   Start     Dose/Rate Route Frequency Ordered Stop   09/25/17 1830  levofloxacin (LEVAQUIN) tablet 750 mg     750 mg Oral Daily 09/25/17 1752 10/02/17 0959   09/23/17 1000  doxycycline (VIBRAMYCIN) 100 mg in sodium chloride 0.9 % 250 mL IVPB  Status:  Discontinued     100 mg 125 mL/hr over 120 Minutes Intravenous Every 12 hours 09/23/17 0831 09/25/17 1752   09/20/17 2200  ceFEPIme (MAXIPIME) 1 g in sodium chloride 0.9 % 100 mL IVPB  Status:  Discontinued     1 g 200 mL/hr over 30 Minutes Intravenous Every 8 hours 09/20/17 0918 09/25/17 1752   09/20/17 2100  vancomycin (VANCOCIN) IVPB 1000 mg/200 mL premix  Status:  Discontinued     1,000 mg 200 mL/hr over 60 Minutes Intravenous Every 12 hours 09/20/17 0802 09/24/17 0930   09/20/17 0800  vancomycin (VANCOCIN) 1,750 mg in sodium chloride 0.9 % 500 mL IVPB     1,750 mg 250 mL/hr over 120 Minutes Intravenous STAT 09/20/17 0751 09/20/17 1026   09/20/17 0315  levofloxacin (LEVAQUIN) IVPB 750 mg     750 mg 100 mL/hr over 90 Minutes Intravenous  Once 09/20/17 0302 09/20/17 0513        Objective: Vitals:   09/24/17 1338 09/24/17 2045 09/25/17 0507 09/25/17 1357  BP: 137/87 (!) 146/96 (!) 146/90 (!) 146/97  Pulse: 73 83 75 79  Resp: 18 18 18 20   Temp: 98.8 F (37.1 C) 98.6 F (37 C) 99 F (37.2 C) 99.1 F (37.3 C)  TempSrc: Oral  Oral Oral  SpO2: 95% 94% 97% 93%  Weight:      Height:  Intake/Output Summary (Last 24 hours) at 09/25/2017 1751 Last data filed at 09/25/2017 1700 Gross per 24 hour  Intake 1730 ml  Output -  Net 1730 ml   Filed Weights   09/20/17 0918 09/20/17 2000 09/23/17 0528  Weight: 86 kg 86 kg 86.1 kg    Examination:  General exam: Appears calm and comfortable  Respiratory system: Decrease BS b/l, scattered rhonchi worse on left, no wheezing  Cardiovascular system: S1 & S2 heard, RRR. No JVD, murmurs, rubs or gallops Gastrointestinal system: Abdomen is nondistended, soft and nontender Central nervous  system: Alert and oriented. Extremities: No LE edema.  Skin: No rashes, lesions or ulcers Psychiatry: Mood & affect appropriate.   Data Reviewed: I have personally reviewed following labs and imaging studies  CBC: Recent Labs  Lab 09/19/17 0911  09/20/17 0300 09/21/17 0423 09/22/17 0348 09/23/17 0436 09/24/17 0311 09/25/17 0414  WBC 9.8   < > 10.9* 9.1 9.3 9.4 7.7 5.6  NEUTROABS 8.6*  --  9.5*  --  8.5* 8.5*  --  4.9  HGB 12.4*   < > 12.3* 10.4* 10.0* 10.9* 10.0* 10.1*  HCT 36.2*  --  36.7* 30.3* 29.4* 32.2* 29.5* 29.5*  MCV 98.4*  --  98.7 98.4 98.3 98.5 97.4 97.4  PLT 131*   < > 127* 108* 120* 147* 123* 97*   < > = values in this interval not displayed.   Basic Metabolic Panel: Recent Labs  Lab 09/20/17 0300 09/21/17 0423 09/22/17 0348 09/24/17 0311 09/25/17 0414  NA 142 139 142 140 139  K 3.9 3.7 3.4* 3.4* 3.3*  CL 106 105 106 105 106  CO2 26 26 29 27 25   GLUCOSE 143* 108* 121* 107* 89  BUN 15 8 9 13 13   CREATININE 0.88 0.67 0.69 0.63 0.59*  CALCIUM 8.9 7.9* 8.3* 8.1* 7.9*   GFR: Estimated Creatinine Clearance: 110.1 mL/min (A) (by C-G formula based on SCr of 0.59 mg/dL (L)). Liver Function Tests: Recent Labs  Lab 09/19/17 0911 09/20/17 0300 09/22/17 0348  AST 25 27 21   ALT 10 14 12   ALKPHOS 43 38 26*  BILITOT 0.6 0.9 0.3  PROT 6.2* 6.0* 4.9*  ALBUMIN 3.3* 3.4* 2.6*   No results for input(s): LIPASE, AMYLASE in the last 168 hours. No results for input(s): AMMONIA in the last 168 hours. Coagulation Profile: No results for input(s): INR, PROTIME in the last 168 hours. Cardiac Enzymes: No results for input(s): CKTOTAL, CKMB, CKMBINDEX, TROPONINI in the last 168 hours. BNP (last 3 results) No results for input(s): PROBNP in the last 8760 hours. HbA1C: No results for input(s): HGBA1C in the last 72 hours. CBG: No results for input(s): GLUCAP in the last 168 hours. Lipid Profile: No results for input(s): CHOL, HDL, LDLCALC, TRIG, CHOLHDL, LDLDIRECT  in the last 72 hours. Thyroid Function Tests: No results for input(s): TSH, T4TOTAL, FREET4, T3FREE, THYROIDAB in the last 72 hours. Anemia Panel: No results for input(s): VITAMINB12, FOLATE, FERRITIN, TIBC, IRON, RETICCTPCT in the last 72 hours. Sepsis Labs: Recent Labs  Lab 09/20/17 0329  LATICACIDVEN 1.17    Recent Results (from the past 240 hour(s))  Culture, blood (routine x 2)     Status: Abnormal   Collection Time: 09/20/17  3:05 AM  Result Value Ref Range Status   Specimen Description   Final    BLOOD CENTRAL LINE Performed at [facility], [place] [address] [place], [place] [address]    Special Requests   Final  BOTTLES DRAWN AEROBIC AND ANAEROBIC Blood Culture adequate volume Performed at [place] [address], [place], [place] [address]    Culture  Setup Time   Final    GRAM POSITIVE RODS Organism ID to follow CRITICAL RESULT CALLED TO, READ BACK BY AND VERIFIED WITH: Lavell Luster PHARMD [id number] 09/23/17 HMILES     Culture (A)  Final    DIPHTHEROIDS(CORYNEBACTERIUM SPECIES) Standardized susceptibility testing for this organism is not available. Performed at [facility], [place] [address], [place], [place] [address]    Report Status 09/25/2017 FINAL  Final  Blood Culture ID Panel (Reflexed)     Status: None   Collection Time: 09/20/17  3:05 AM  Result Value Ref Range Status   Enterococcus species NOT DETECTED NOT DETECTED Final   Listeria monocytogenes NOT DETECTED NOT DETECTED Final   Staphylococcus species NOT DETECTED NOT DETECTED Final   Staphylococcus aureus NOT DETECTED NOT DETECTED Final   Streptococcus species NOT DETECTED NOT DETECTED Final   Streptococcus agalactiae NOT DETECTED NOT DETECTED Final   Streptococcus pneumoniae NOT DETECTED NOT DETECTED Final   Streptococcus pyogenes NOT DETECTED NOT DETECTED Final   Acinetobacter baumannii NOT DETECTED NOT DETECTED Final   Enterobacteriaceae species NOT DETECTED  NOT DETECTED Final   Enterobacter cloacae complex NOT DETECTED NOT DETECTED Final   Escherichia coli NOT DETECTED NOT DETECTED Final   Klebsiella oxytoca NOT DETECTED NOT DETECTED Final   Klebsiella pneumoniae NOT DETECTED NOT DETECTED Final   Proteus species NOT DETECTED NOT DETECTED Final   Serratia marcescens NOT DETECTED NOT DETECTED Final   Haemophilus influenzae NOT DETECTED NOT DETECTED Final   Neisseria meningitidis NOT DETECTED NOT DETECTED Final   Pseudomonas aeruginosa NOT DETECTED NOT DETECTED Final   Candida albicans NOT DETECTED NOT DETECTED Final   Candida glabrata NOT DETECTED NOT DETECTED Final   Candida krusei NOT DETECTED NOT DETECTED Final   Candida parapsilosis NOT DETECTED NOT DETECTED Final   Candida tropicalis NOT DETECTED NOT DETECTED Final  Culture, blood (routine x 2)     Status: None   Collection Time: 09/20/17  3:17 AM  Result Value Ref Range Status   Specimen Description   Final    BLOOD CENTRAL LINE Performed at [facility], [place] [address] [place], [place] [address]    Special Requests   Final    BOTTLES DRAWN AEROBIC AND ANAEROBIC Blood Culture adequate volume Performed at [place] [address], [place], [place] [address]    Culture   Final    NO GROWTH 5 DAYS Performed at [facility], [place] [address], [place], [place] [address]    Report Status 09/25/2017 FINAL  Final  Culture, sputum-assessment     Status: None   Collection Time: 09/20/17  9:19 AM  Result Value Ref Range Status   Specimen Description SPUTUM  Final   Special Requests NONE  Final   Sputum evaluation   Final    THIS SPECIMEN IS ACCEPTABLE FOR SPUTUM CULTURE Performed at [facility], [place] [address] [place], [place] [address]    Report Status 09/21/2017 FINAL  Final  Culture, respiratory     Status: None   Collection Time: 09/20/17  9:19 AM  Result Value Ref Range Status   Specimen Description   Final     SPUTUM Performed at [place] [address], [place],  [address]    Special Requests   Final  NONE Reflexed from [id number] Performed at [facility], [place] [address], [place], [place] [address]    Gram Stain   Final    RARE WBC PRESENT, PREDOMINANTLY PMN RARE GRAM POSITIVE RODS    Culture   Final    Consistent with normal respiratory flora. Performed at [facility], [place] [address] [place], [place] [address]    Report Status 09/23/2017 FINAL  Final  Urine culture     Status: None   Collection Time: 09/20/17  6:00 PM  Result Value Ref Range Status   Specimen Description   Final    URINE, RANDOM Performed at [place] [address], [place], [place] [address]    Special Requests   Final    NONE Performed at [facility], [place] [address] [place], [place] [address]    Culture   Final    NO GROWTH Performed at [facility], [place] [address], [place], [place] [address]    Report Status 09/22/2017 FINAL  Final  MRSA PCR Screening     Status: None   Collection Time: 09/21/17 10:30 AM  Result Value Ref Range Status   MRSA by PCR NEGATIVE NEGATIVE Final    Comment:        The GeneXpert MRSA Assay (FDA approved for NASAL specimens only), is one component of a comprehensive MRSA colonization surveillance program. It is not intended to diagnose MRSA infection nor to guide or monitor treatment for MRSA infections. Performed at [facility], [place] [address] [place], [place] [address]      Radiology Studies: Dg Chest 2 View  Result Date: 09/24/2017 CLINICAL DATA:  Shortness of breath.  Non-Hodgkin's lymphoma. EXAM: CHEST - 2 VIEW COMPARISON:  Chest CT September 20, 2017. Chest x-ray September 23, 2017. FINDINGS: Innumerable pulmonary nodules are again identified. Focal infiltrate in the left base persists. A small left effusion is noted. No change in the  cardiomediastinal silhouette. The right Port-A-Cath is stable. No pneumothorax. IMPRESSION: 1. Innumerable pulmonary nodules persist, similar in the interval. 2. Small left effusion.  Left basilar infiltrate remains. Electronically Signed   By: Dorise Bullion III M.D   On: 09/24/2017 12:52    Scheduled Meds: . atenolol  50 mg Oral Daily  . benzonatate  100 mg Oral TID  . chlorpheniramine-HYDROcodone  5 mL Oral Q12H   Continuous Infusions: . sodium chloride Stopped (09/24/17 0543)  . ceFEPime (MAXIPIME) IV 1 g (09/25/17 1331)  . doxycycline (VIBRAMYCIN) IV 100 mg (09/25/17 0911)     LOS: 5 days    Time spent: Total of 25 minutes spent with pt, greater than 50% of which was spent in discussion of  treatment, counseling and coordination of care   Chipper Oman, MD Pager: Text Page via [email or web address]   If 7PM-7AM, please contact night-coverage [email or web address] 09/25/2017, 5:51 PM   Note - This record has been created using [employer]. Chart creation errors have been sought, but may not always have been located. Such creation errors do not reflect on the standard of medical care.

## 2017-09-25 NOTE — Care Management Note (Signed)
Case Management Note  Patient Details  Name: [name] MRN: [id number] Date of Birth: Nov 17, 1960  Subjective/Objective:  Note qualifies for home 02-[facility] rep Santiago Glad aware-await home 02 order. [facility] will deliver travel home 02 tank to rm prior d/c. No further CM needs.                  Action/Plan:d/c hme w/home 02   Expected Discharge Date:  (unknown)               Expected Discharge Plan:  Home/Self Care  In-House Referral:     Discharge planning Services  CM Consult  Post Acute Care Choice:    Choice offered to:     DME Arranged:  Oxygen DME Agency:  [place]:    [facility] Agency:     Status of Service:  Completed, signed off  If discussed at [place] of Stay Meetings, dates discussed:    Additional Comments:  Dessa Phi, RN 09/25/2017, 2:23 PM

## 2017-09-25 NOTE — Progress Notes (Signed)
Physical Therapy  SATURATION QUALIFICATIONS: (This note is used to comply with regulatory documentation for home oxygen)  Patient Saturations on Room Air at Rest = 89%  Patient Saturations on Room Air while Ambulating = 84%   HR 109  Patient Saturations on 3 Liters of oxygen while Ambulating > 100 feet  = 90% 2 lts was not enough stas 87%  Please briefly explain why patient needs home oxygen:  Pt requires supplemental oxygen to achieve therapeutic level  Rica Koyanagi  PTA [facility]  Acute  Rehab Pager      [phone]

## 2017-09-25 NOTE — Progress Notes (Signed)
DISCONTINUE OFF PATHWAY REGIMEN - Lymphoma and CLL   [id number]:CHOP q21 days:   A cycle is every 21 days:     Cyclophosphamide      Doxorubicin      Vincristine      Prednisone   **Always confirm dose/schedule in your pharmacy ordering system**  REASON: Disease Progression PRIOR TREATMENT: Off Pathway: CHOP q21 days TREATMENT RESPONSE: Unable to Evaluate  START OFF PATHWAY REGIMEN - Lymphoma and CLL   [id number]:GDP (gemcitabine + dexamethasone + cisplatin) q21 Days:   A cycle is every 21 days:     Dexamethasone      Gemcitabine      Cisplatin   **Always confirm dose/schedule in your pharmacy ordering system**  Patient Characteristics: T-Cell Lymphoma, Third Line and Beyond Disease Type: Not Applicable Disease Type: T-Cell Lymphoma Disease Type: Not Applicable Line of Therapy: Third Line and [employer] Stage: Unknown Intent of Therapy: Curative Intent, Discussed with Patient

## 2017-09-25 NOTE — Progress Notes (Signed)
Assume care of patient. No changes from previous assessment. Patient stable with no complaints. Will continue to monitor. Gonzella Lex, RN 09/25/2017 5:58 PM

## 2017-09-25 NOTE — Progress Notes (Signed)
SATURATION QUALIFICATIONS: (This note is used to comply with regulatory documentation for home oxygen)  Patient Saturations on Room Air at Rest = 89%  Patient Saturations on Room Air while Ambulating = 84%  Patient Saturations on 3 Liters of oxygen while Ambulating = 90%  Please briefly explain why patient needs home oxygen: requires oxygen with any exertion to maintain appropriate levels.

## 2017-09-25 NOTE — Progress Notes (Signed)
IP PROGRESS NOTE  Subjective:   [name] appears unchanged.  Good appetite.  Dyspnea has improved since hospital admission Objective: Vital signs in last 24 hours: Blood pressure (!) 146/90, pulse 75, temperature 99 F (37.2 C), temperature source Oral, resp. rate 18, height '5\' 8"'$  (1.727 m), weight 189 lb 14.4 oz (86.1 kg), SpO2 97 %.  Intake/Output from previous day: 09/03 0701 - 09/04 0700 In: 1037 [I.V.:434.7; IV Piggyback:602.4] Out: -   Physical Exam:  HEENT: No thrush or ulcers Lungs: Air movement bilaterally, no wheezing, decreased breath sounds of the left posterior base Cardiac: Regular rate and rhythm Abdomen: No hepatosplenomegaly, no mass, nontender Extremities: No leg edema   Portacath/PICC-without erythema  Lab Results: Recent Labs    09/24/17 0311 09/25/17 0414  WBC 7.7 5.6  HGB 10.0* 10.1*  HCT 29.5* 29.5*  PLT 123* 97*    BMET Recent Labs    09/24/17 0311 09/25/17 0414  NA 140 139  K 3.4* 3.3*  CL 105 106  CO2 27 25  GLUCOSE 107* 89  BUN 13 13  CREATININE 0.63 0.59*  CALCIUM 8.1* 7.9*      Medications: I have reviewed the patient's current medications.  Assessment/Plan:  1. 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  PET scan 03/20/2016 with extensive bulky hypermetabolic nodal activity in the neck, chest, abdomen and pelvis; mild splenomegaly with diffusely mildly accentuated splenic activity  Staging bone marrow biopsy 03/23/2016-negative for involvement with lymphoma  Cycle 1 EPOCH beginning 03/23/2016  Cycle 2 [facility]  04/13/2016  Restaging PET scan at [facility] 05/08/2016-lymph nodes with various degrees of FDG activity in the neck, axilla, right written him, mesentery, pelvis, and groin. Indeterminate liver lesions without FDG activity, nodular mass in the posterior medial aspect of the left thigh  Cycle 3 EPOCH04/27/2018  Cycle 1 CVP 06/21/2016  Cytoxan/prednisone plus brentuximab 07/17/2016  Cytoxan/prednisone plus brentuximab 08/07/2016 (dose reduced)  Initiation of every three-week brentuximab08/08/2016  brentuximabdose reduced 10/31/2016 secondary to neuropathy  Brentuximab further dose reduced 01/03/2017 secondary to neuropathy  Presentation with parietal scalp nodular lesion 03/08/2017  Staging PET scan 3/81/0175-ZWCHENI hypermetabolic lymphadenopathy in the neck, chest, abdomen/pelvis, and hypermetabolic cutaneous lesions  03/22/2017 CT biopsy left retroperitoneal nodal mass-recurrent CD30 positive T-cell lymphoma  Cycle 1 bendamustine/CPI-613on clinical trial at [facility] 04/08/2017  CTs 05/22/2017 After 2 cycles-mixed response: Enlargement of left supraclavicular node, right axillary node, right pelvic sidewall mass, decreased retroperitoneal abdominal adenopathy  Cycle 3 bendamustine/CPI-613 06/03/2017  Cycle 4 bendamustine/CPI-613 07/09/2017  CT 08/05/2017- increase in the size of a level to a lymph node, decreased size of level 3 and supraclavicular nodes, enlargement of right axillary nodes, new bilateral pulmonary nodules, decreased mediastinal, rectal peritoneal, and iliac nodes  Cycle 5 bendamustine/CPI-613 08/06/2017  Cycle 6 bendamustine/CPI-613 09/02/2017 2. 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 He entered clinical remission following chemotherapy and remained in remission when he was seen at the cancer center 02/24/2009. 3. 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 Staging PET  scan 08/23/2010 with increased FDG activity at the left tonsillar fossa and no additional evidence of lymphoma. He completed 4 cycles of R-ICE with cycle #1 beginning on 09/19/2010 and cycle #4 on 11/21/2010. Repeat head and neck examination by Dr. Constance Holster following R-ICE/rituximab showed no residual lymphoma. He began radiation consolidation on 12/18/2010, radiation was completed on 01/22/2011. 4. History of neutropenia secondary to rituximab. 5. Anemia secondary to chemotherapy, status post a red blood cell transfusion 11/30/2010. The hemoglobin has normalized. 6. History of mild thrombocytopenia secondary to chemotherapy. 7. Hypertension. 8. 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 05/08/2017-nodular mass in the left thigh adjacent to the femur  MRI 06/07/2016-infiltrative mass in the left thigh adductor muscle, separate from the schwannoma excision site  MRI 10/08/2016-marked improvement in the left adductormusclemass with a small area of enhancement remaining, no discrete mass 9. Port-A-Cath placement 03/21/2016 10. Superficial thrombus left greater saphenous vein 04/13/2016. Lovenox initiated, converted to Xarelto beginning 04/18/2016 11. 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 Admission 09/20/2017 with cough/fever/dyspnea-CT concerning for progression of lymphoma in the lungs versus pneumonia   [name] appears stable.  I have reviewed the chest CT with pulmonary medicine.  The clinical presentation is consistent with lymphoma involving the lungs and superimposed pneumonia.  He has improved with antibiotics over the past several days.  He will continue a course of antibiotics.  I  will taper the prednisone.   We decided against a lung biopsy.  The plan is to begin salvage systemic therapy with gemcitabine/cisplatin/Decadron early next week.  He will see Dr. Reita Chard at [facility] to consider CAR-T therapy.  A blood culture from the Port-A-Cath 09/20/2017 revealed a gram-positive rod-diphtheroid, likely a contaminant      Recommendations: 1.  Continue antibiotics, taper prednisone 2.  Arrange for home oxygen 3.  Plan for gemcitabine, cisplatin, Decadron therapy as an outpatient    LOS: 5 days   [name], MD   09/25/2017, 6:48 AM

## 2017-09-25 NOTE — Progress Notes (Signed)
Physical Therapy Treatment Patient Details Name: [name] MRN: [id number] DOB: [birth year] Today's Date: 09/25/2017    History of Present Illness Pt is a 57 YO male admitted 8/30 for acute respiratory failure, symptoms including fevers, SOB, wheezing. Pt with nonhodgkins lymphoma dx in 2018. PMh includes essential tremor, diffuse large B cell lymphoma diagnosed 2005.     PT Comments    Assisted with amb a great distance in hallway trial RA. SATURATION QUALIFICATIONS: (This note is used to comply with regulatory documentation for home oxygen)  Patient Saturations on Room Air at Rest = 89%  Patient Saturations on Room Air while Ambulating = 84%   HR 109  Patient Saturations on 3 Liters of oxygen while Ambulating > 100 feet  = 90% 2 lts was not enough stas 87%  Please briefly explain why patient needs home oxygen:  Pt requires supplemental oxygen to achieve therapeutic level  Pt plans to D/C to home with spouse.  Educated on activity tolerance and need for oxygen.    Follow Up Recommendations  No PT follow up     Equipment Recommendations  None recommended by PT    Recommendations for Other Services       Precautions / Restrictions Precautions Precaution Comments: monitor sats  Restrictions Weight Bearing Restrictions: No    Mobility  Bed Mobility Overal bed mobility: Modified Independent                Transfers Overall transfer level: Modified independent               General transfer comment: Increased effort for standing, no AD used   Ambulation/Gait Ambulation/Gait assistance: Supervision Gait Distance (Feet): 650 Feet Assistive device: None Gait Pattern/deviations: Step-through pattern;Decreased stride length Gait velocity: normal    General Gait Details: did not allow pt to steady self on IV pole this session.  trial amb on RA.  Sats decreased to 84% and required 3 lts to achieve sats > 90%. Also present with mild dyspnea and cough.   "   Stairs Stairs: (unable to attempt flight due to IV pole)           Wheelchair Mobility    Modified Rankin (Stroke Patients Only)       Balance                                            Cognition Arousal/Alertness: Awake/alert Behavior During Therapy: WFL for tasks assessed/performed Overall Cognitive Status: Within Functional Limits for tasks assessed                                 General Comments: highly motivated       Exercises      General Comments        Pertinent Vitals/Pain Pain Assessment: No/denies pain    Home Living                      Prior Function            PT Goals (current goals can now be found in the care plan section) Progress towards PT goals: Progressing toward goals    Frequency    Min 3X/week      PT Plan Current plan remains appropriate    Co-evaluation  AM-PAC PT "6 Clicks" Daily Activity  Outcome Measure  Difficulty turning over in bed (including adjusting bedclothes, sheets and blankets)?: None Difficulty moving from lying on back to sitting on the side of the bed? : None Difficulty sitting down on and standing up from a chair with arms (e.g., wheelchair, bedside commode, etc,.)?: None Help needed moving to and from a bed to chair (including a wheelchair)?: None Help needed walking in hospital room?: A Little Help needed climbing 3-5 steps with a railing? : A Little 6 Click Score: 22    End of Session Equipment Utilized During Treatment: Gait belt Activity Tolerance: Patient tolerated treatment well Patient left: in chair;with call bell/phone within reach;with family/visitor present Nurse Communication: Mobility status PT Visit Diagnosis: Difficulty in walking, not elsewhere classified (R26.2)     Time: 1006-1020 PT Time Calculation (min) (ACUTE ONLY): 14 min  Charges:  $Gait Training: 8-22 mins                     Rica Koyanagi  PTA [facility]   Acute  Rehab Pager      [phone]

## 2017-09-26 ENCOUNTER — Telehealth: Payer: Self-pay

## 2017-09-26 LAB — CBC WITH DIFFERENTIAL/PLATELET
BASOS PCT: 0 %
Basophils Absolute: 0 10*3/uL (ref 0.0–0.1)
EOS ABS: 0.1 10*3/uL (ref 0.0–0.7)
Eosinophils Relative: 2 %
HEMATOCRIT: 31.4 % — AB (ref 39.0–52.0)
HEMOGLOBIN: 10.8 g/dL — AB (ref 13.0–17.0)
Lymphocytes Relative: 3 %
Lymphs Abs: 0.2 10*3/uL — ABNORMAL LOW (ref 0.7–4.0)
MCH: 33.1 pg (ref 26.0–34.0)
MCHC: 34.4 g/dL (ref 30.0–36.0)
MCV: 96.3 fL (ref 78.0–100.0)
MONOS PCT: 7 %
Monocytes Absolute: 0.4 10*3/uL (ref 0.1–1.0)
NEUTROS ABS: 5.2 10*3/uL (ref 1.7–7.7)
NEUTROS PCT: 88 %
Platelets: 114 10*3/uL — ABNORMAL LOW (ref 150–400)
RBC: 3.26 MIL/uL — ABNORMAL LOW (ref 4.22–5.81)
RDW: 14.8 % (ref 11.5–15.5)
WBC: 5.8 10*3/uL (ref 4.0–10.5)

## 2017-09-26 LAB — BASIC METABOLIC PANEL
ANION GAP: 10 (ref 5–15)
BUN: 13 mg/dL (ref 6–20)
CALCIUM: 8.7 mg/dL — AB (ref 8.9–10.3)
CO2: 29 mmol/L (ref 22–32)
Chloride: 101 mmol/L (ref 98–111)
Creatinine, Ser: 0.68 mg/dL (ref 0.61–1.24)
GLUCOSE: 98 mg/dL (ref 70–99)
POTASSIUM: 3.6 mmol/L (ref 3.5–5.1)
Sodium: 140 mmol/L (ref 135–145)

## 2017-09-26 LAB — MAGNESIUM: Magnesium: 2 mg/dL (ref 1.7–2.4)

## 2017-09-26 MED ORDER — LEVOFLOXACIN 750 MG PO TABS: 750.0000 mg | ORAL_TABLET | Freq: Every day | ORAL | 0 refills | Status: AC

## 2017-09-26 MED ORDER — HEPARIN SOD (PORK) LOCK FLUSH 100 UNIT/ML IV SOLN
500.0000 [IU] | INTRAVENOUS | Status: AC | PRN
  Administered 2017-09-26: 500 [IU]

## 2017-09-26 MED ORDER — ALBUTEROL SULFATE HFA 108 (90 BASE) MCG/ACT IN AERS: 2.0000 | INHALATION_SPRAY | Freq: Four times a day (QID) | RESPIRATORY_TRACT | 0 refills | Status: AC | PRN

## 2017-09-26 NOTE — Progress Notes (Signed)
IP PROGRESS NOTE  Subjective:   [name] has been ambulating in the hallway.  He reports feeling much better compared to hospital admission.  No new complaint. Objective: Vital signs in last 24 hours: Blood pressure 124/85, pulse 85, temperature 98.6 F (37 C), temperature source Oral, resp. rate 18, height '5\' 8"'$  (1.727 m), weight 189 lb 14.4 oz (86.1 kg), SpO2 93 %.  Intake/Output from previous day: 09/04 0701 - 09/05 0700 In: 1340 [P.O.:1080; I.V.:260] Out: -   Physical Exam:  HEENT: No thrush or ulcers Lungs: No wheezing.  Good air movement bilaterally, inspiratory rales at the left lower posterior chest, no respiratory distress Cardiac: Regular rate and rhythm Lymph nodes: Mobile lymph node mass in the right axilla Extremities: No leg edema   Portacath/PICC-without erythema  Lab Results: Recent Labs    09/25/17 0414 09/26/17 0414  WBC 5.6 5.8  HGB 10.1* 10.8*  HCT 29.5* 31.4*  PLT 97* 114*    BMET Recent Labs    09/25/17 0414 09/26/17 0414  NA 139 140  K 3.3* 3.6  CL 106 101  CO2 25 29  GLUCOSE 89 98  BUN 13 13  CREATININE 0.59* 0.68  CALCIUM 7.9* 8.7*      Medications: I have reviewed the patient's current medications.  Assessment/Plan:  1. 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  PET scan 03/20/2016 with extensive bulky hypermetabolic nodal activity in the neck, chest, abdomen and pelvis; mild splenomegaly with diffusely mildly accentuated splenic activity  Staging bone marrow biopsy 03/23/2016-negative for involvement with lymphoma  Cycle 1 EPOCH  beginning 03/23/2016  Cycle 2 [facility] 04/13/2016  Restaging PET scan at [facility] 05/08/2016-lymph nodes with various degrees of FDG activity in the neck, axilla, right written him, mesentery, pelvis, and groin. Indeterminate liver lesions without FDG activity, nodular mass in the posterior medial aspect of the left thigh  Cycle 3 EPOCH04/27/2018  Cycle 1 CVP 06/21/2016  Cytoxan/prednisone plus brentuximab 07/17/2016  Cytoxan/prednisone plus brentuximab 08/07/2016 (dose reduced)  Initiation of every three-week brentuximab08/08/2016  brentuximabdose reduced 10/31/2016 secondary to neuropathy  Brentuximab further dose reduced 01/03/2017 secondary to neuropathy  Presentation with parietal scalp nodular lesion 03/08/2017  Staging PET scan 1/77/9390-[id number] hypermetabolic lymphadenopathy in the neck, chest, abdomen/pelvis, and hypermetabolic cutaneous lesions  03/22/2017 CT biopsy left retroperitoneal nodal mass-recurrent CD30 positive T-cell lymphoma  Cycle 1 bendamustine/CPI-613on clinical trial at [facility] 04/08/2017  CTs 05/22/2017 After 2 cycles-mixed response: Enlargement of left supraclavicular node, right axillary node, right pelvic sidewall mass, decreased retroperitoneal abdominal adenopathy  Cycle 3 bendamustine/CPI-613 06/03/2017  Cycle 4 bendamustine/CPI-613 07/09/2017  CT 08/05/2017- increase in the size of a level to a lymph node, decreased size of level 3 and supraclavicular nodes, enlargement of right axillary nodes, new bilateral pulmonary nodules, decreased mediastinal, rectal peritoneal, and iliac nodes  Cycle 5 bendamustine/CPI-613 08/06/2017  Cycle 6 bendamustine/CPI-613 09/02/2017 2. 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 He entered clinical remission following chemotherapy and remained in remission when he was seen at the cancer center  02/24/2009. 3. 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 Staging PET scan 08/23/2010 with increased FDG activity at the left tonsillar fossa and no additional evidence of lymphoma. He completed 4 cycles of R-ICE with cycle #1 beginning on 09/19/2010 and cycle #4 on 11/21/2010. Repeat head and neck examination by Dr. Constance Holster following R-ICE/rituximab showed no residual lymphoma. He began radiation consolidation on 12/18/2010, radiation was completed on 01/22/2011. 4. History of neutropenia secondary to rituximab. 5. Anemia secondary to chemotherapy, status post a red blood cell transfusion 11/30/2010. The hemoglobin has normalized. 6. History of mild thrombocytopenia secondary to chemotherapy. 7. Hypertension. 8. 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 05/08/2017-nodular mass in the left thigh adjacent to the femur  MRI 06/07/2016-infiltrative mass in the left thigh adductor muscle, separate from the schwannoma excision site  MRI 10/08/2016-marked improvement in the left adductormusclemass with a small area of enhancement remaining, no discrete mass 9. Port-A-Cath placement 03/21/2016 10. Superficial thrombus left greater saphenous vein 04/13/2016. Lovenox initiated, converted to Xarelto beginning 04/18/2016 11. 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 Admission 09/20/2017 with cough/fever/dyspnea-CT concerning for progression of lymphoma in the lungs versus pneumonia treated with vancomycin/cefepime, started Levaquin 09/25/2017   [name] appears stable for discharge.  He will complete an outpatient course of antibiotics.  He will continue oxygen therapy at home.  Prednisone has been discontinued.  He is  scheduled for outpatient follow-up at the Cancer center on 09/30/2017.  The plan is to begin gemcitabine, cisplatin, and Decadron therapy if his clinical status remains improved.  He will contact us for increased dyspnea.      Recommendations: 1.  Continue Levaquin 2.  Home oxygen 3.  Inhaler to use as needed 4.  Plan for gemcitabine, cisplatin, Decadron therapy as an outpatient 09/30/2017    LOS: 6 days   [name], MD   09/26/2017, 7:20 AM

## 2017-09-26 NOTE — Telephone Encounter (Signed)
Called to inform pt and wife that they are scheduled Friday 13th at 12pm at [facility] for labs at 62 and Dr. Josie Saunders at 1pm. Pt wife voiced understanding.

## 2017-09-26 NOTE — Discharge Summary (Signed)
Physician Discharge Summary  [name]  NUU:[id number]  DOB: January 06, 1961  DOA: 09/20/2017 PCP: [name], MD  Admit date: 09/20/2017 Discharge date: 09/26/2017  Admitted From: Home  Disposition: Home   Recommendations for Outpatient Follow-up:  1. Follow up with PCP in 1 week  2. Please obtain BMP/CBC in one week to monitor renal function and Hgb  3. 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  Subjective: Patient seen and examined, report feeling much better compared to yesterday.  No new complaints today.  He has been ambulated in the hallway.  No acute events overnight.  Oxygen at 2 L nasal cannula and saturating above 91%.  Discharge Diagnoses/Hospital Course:  Principal Problem:   Acute respiratory failure (HCC) Active Problems:   Anemia, unspecified   Non Hodgkin's lymphoma ([place])   HCAP (healthcare-associated pneumonia)   Acute respiratory failure with hypoxia (HCC)  Acute hypoxic respiratory failure - improved  Multifactorial from lymphoma and superimposed pneumonia. Patient treated with IV cefepime and vancomycin, this was discontinued as MRSA PCR was negative on 9/3.  Patient started on doxycycline on 9/2 for atypical coverage.  Sputum, urine cultures no growth to date.  Patient was treated prednisone 40 mg daily  for 5 days. Unable to complete wean oxygen, continue O2 supplementation to keep O2 sat > 89%. Patient was started on Levaquin and remains afebrile, tolerating well. Will continue to complete abx therapy for 14 days. End date 10/03/17. Albuterol PRN was prescribed as well. Follow up with PCP  Pneumonia  See above   T-cell lymphoma Plan for gemcitabine, cisplatin and Decadron therapy as an outpatient.  Patient has a scheduled follow-up appointment on the cancer center on 09/30/2017. Continue management per oncology   Superficial vein thrombosis Continue Xarelto  Hypertension BP stable Continue atenolol 50 mg daily  All other chronic medical condition were stable during the hospitalization.  Patient was seen by physical therapy, recommending no follow-up. On the day of the discharge the patient's vitals were stable, and no other acute medical condition were reported by patient. the patient was felt safe to be discharge to home  Discharge Instructions  You were cared for by a hospitalist during your hospital stay. If you have any questions about your discharge medications or the care you received while you were in the hospital after you are discharged, you can call the unit and asked to speak with the hospitalist on call if the hospitalist that took care of you is not available. Once you are discharged, your primary care physician will handle any further medical issues. Please note that NO REFILLS for any discharge medications will be authorized once you are discharged, as it is imperative that you return to your primary care physician (or establish a relationship with a primary care physician if you do not have one) for your aftercare needs so that they can reassess your need for medications  and monitor your lab values.  Discharge Instructions    Call MD for:  difficulty breathing, headache or visual disturbances   Complete by:  As directed    Call MD for:  extreme fatigue   Complete by:  As  directed    Call MD for:  hives   Complete by:  As directed    Call MD for:  persistant dizziness or light-headedness   Complete by:  As directed    Call MD for:  persistant nausea and vomiting   Complete by:  As directed    Call MD for:  redness, tenderness, or signs of infection (pain, swelling, redness, odor or green/yellow discharge around incision site)   Complete by:  As directed    Call MD for:  severe uncontrolled pain   Complete by:  As directed    Call MD for:  temperature >100.4   Complete by:  As directed    Diet - low sodium heart healthy   Complete by:  As directed    Discharge instructions   Complete by:  As directed    Call cancer center if shortness of breath worsen   Increase activity slowly   Complete by:  As directed      Allergies as of 09/26/2017      Reactions   Penicillins Other (See Comments)   Childhood allergy reaction unknown  Has patient had a PCN reaction causing immediate rash, facial/tongue/throat swelling, SOB or lightheadedness with hypotension: Unknown Has patient had a PCN reaction causing severe rash involving mucus membranes or skin necrosis: Unknown Has patient had a PCN reaction that required hospitalization: Unknown Has patient had a PCN reaction occurring within the last 10 years: Unknown If all of the above answers are "NO", then may proceed with Cephalosporin use.      Medication List    STOP taking these medications   LORazepam 0.5 MG tablet Commonly known as:  ATIVAN     TAKE these medications   albuterol 108 (90 Base) MCG/ACT inhaler Commonly known as:  PROVENTIL HFA;VENTOLIN HFA Inhale 2 puffs into the lungs every 6 (six) hours as needed for wheezing or shortness of breath.   atenolol 50 MG tablet Commonly known as:  TENORMIN Take 50 mg by mouth daily.   cetirizine 10 MG tablet Commonly known as:  ZYRTEC Take 10 mg by mouth daily as needed for allergies.   chlorpheniramine-HYDROcodone 10-8 MG/5ML Suer Commonly known  as:  TUSSIONEX Take 5 mLs by mouth every 12 (twelve) hours as needed for cough.   levofloxacin 750 MG tablet Commonly known as:  LEVAQUIN Take 1 tablet (750 mg total) by mouth daily for 6 days. Start taking on:  09/27/2017 What changed:    medication strength  how much to take  additional instructions   lidocaine-prilocaine cream Commonly known as:  EMLA Apply 1 application topically as needed. Apply one hour prior to port access and cover with plastic wrap   ondansetron 8 MG tablet Commonly known as:  ZOFRAN Take 1 tablet (8 mg total) by mouth every 6 (six) hours as needed for nausea.   oxyCODONE-acetaminophen 5-325 MG tablet Commonly known as:  PERCOCET/ROXICET Take 1-2 tablets by mouth every 4 (four) hours as needed for severe pain. Up to 8 tabs daily.   prochlorperazine 10 MG tablet Commonly known as:  COMPAZINE Take 1 tablet (10 mg total) by mouth every 6 (six) hours as needed for nausea or vomiting.   XARELTO 20 MG Tabs tablet Generic  drug:  rivaroxaban TAKE 1 TABLET DAILY WITH SUPPER. What changed:  See the new instructions.            Durable Medical Equipment  (From admission, onward)         Start     Ordered   09/26/17 1154  For home use only DME oxygen  Once    Question Answer Comment  Mode or (Route) Mask   Liters per Minute 2   Frequency Continuous (stationary and portable oxygen unit needed)   Oxygen delivery system Gas      09/26/17 1153         [facility] Follow up.   Why:  home oxygen Contact information: [place] [address] [phone]        [name], MD. Schedule an appointment as soon as possible for a visit in 1 week(s).   Specialty:  Internal Medicine Why:  hospital follow up  Contact information: [address], [address] [place] [address] [phone]        Ladell Pier, MD. Go on 09/30/2017.   Specialty:  Oncology Contact  information: [place] [address] [phone]          Allergies  Allergen Reactions  . Penicillins Other (See Comments)    Childhood allergy reaction unknown  Has patient had a PCN reaction causing immediate rash, facial/tongue/throat swelling, SOB or lightheadedness with hypotension: Unknown Has patient had a PCN reaction causing severe rash involving mucus membranes or skin necrosis: Unknown Has patient had a PCN reaction that required hospitalization: Unknown Has patient had a PCN reaction occurring within the last 10 years: Unknown If all of the above answers are "NO", then may proceed with Cephalosporin use.     Consultations:  Oncology   Procedures/Studies: Dg Chest 2 View  Result Date: 09/24/2017 CLINICAL DATA:  Shortness of breath.  Non-Hodgkin's lymphoma. EXAM: CHEST - 2 VIEW COMPARISON:  Chest CT September 20, 2017. Chest x-ray September 23, 2017. FINDINGS: Innumerable pulmonary nodules are again identified. Focal infiltrate in the left base persists. A small left effusion is noted. No change in the cardiomediastinal silhouette. The right Port-A-Cath is stable. No pneumothorax. IMPRESSION: 1. Innumerable pulmonary nodules persist, similar in the interval. 2. Small left effusion.  Left basilar infiltrate remains. Electronically Signed   By: Dorise Bullion III M.D   On: 09/24/2017 12:52   Dg Chest 2 View  Result Date: 09/23/2017 CLINICAL DATA:  Cough and congestion, lymphoma EXAM: CHEST - 2 VIEW COMPARISON:  09/20/2017, 09/19/2017 FINDINGS: Similar pattern of innumerable pulmonary nodules. Slight worsening superimposed diffuse interstitial opacities may represent developing mild edema/volume overload. Trace pleural effusions. Increased basilar atelectasis. Stable heart size and vascularity. Trachea is midline. No pneumothorax. IMPRESSION: Similar pattern of innumerable pulmonary nodules compatible with metastatic disease. Slight increased interstitial  opacities concerning for developing early edema/volume overload Trace pleural effusions and basilar atelectasis. Electronically Signed   By: Jerilynn Mages.  Shick M.D.   On: 09/23/2017 09:20   Dg Chest 2 View  Result Date: 09/19/2017 CLINICAL DATA:  Cough for 4 days, short of breath and wheezing today, fever, history of T-cell lymphoma EXAM: CHEST - 2 VIEW COMPARISON:  Chest x-ray of 02/08/2017 and PET-CT of 03/15/2016 FINDINGS: There are now multiple nodular opacities scattered throughout both lungs. This could represent inflammatory or infectious process, but of fullness involvement of the lungs cannot be excluded. CT of the  chest is recommended to evaluate further. No pleural effusion is seen. Mediastinal and hilar contours are unremarkable. Right-sided Port-A-Cath tip is seen to the lower SVC. The heart is within normal limits in size. There are degenerative changes throughout the thoracic spine. IMPRESSION: Interval development of nodular opacities throughout both lungs. Possible inflammatory or infectious process but cannot exclude metastatic or lymphomatous lesions. Recommend CT of the chest. Electronically Signed   By: Ivar Drape M.D.   On: 09/19/2017 12:06   Ct Chest W Contrast  Result Date: 09/20/2017 CLINICAL DATA:  57 y/o M; dyspnea and hypoxia. Shortness of breath with fever over the past 5 days. History of lymphoma. EXAM: CT CHEST WITH CONTRAST TECHNIQUE: Multidetector CT imaging of the chest was performed during intravenous contrast administration. CONTRAST:  158mL ISOVUE-300 IOPAMIDOL (ISOVUE-300) INJECTION 61% COMPARISON:  03/15/2017 PET-CT and 03/20/2016 PET-CT. FINDINGS: Cardiovascular: No significant vascular findings. Normal heart size. No pericardial effusion. Right port catheter tip extends to the cavoatrial junction. Mediastinum/Nodes: Progression of mediastinal lymph nodes are enlarged when compared with the prior PET-CT, the largest in the right hilum measuring 16 x 12 mm (series 2, image 63).  Severe right axillary lymphadenopathy with nodes measuring up to 4.8 x 2.4 cm (series 2, image 59). Lungs/Pleura: Numerous pulmonary nodules throughout the lungs are rounded masslike and likely represent pulmonary metastatic disease. More confluent consolidation throughout the left lower lobe and small left effusion is more consistent with pneumonia. No pneumothorax. Upper Abdomen: Stable liver cysts within the left lobe of the liver. Additional soft tissue attenuating lesions near the IVC and in the right lateral lobe of the liver are stable from prior PET-CTs where they did not demonstrate FDG uptake, likely benign. Musculoskeletal: No chest wall abnormality. No acute or significant osseous findings. IMPRESSION: 1. Progression of mediastinal adenopathy with large bulky lymph nodes in the right axilla measuring up to 4.8 cm. Findings likely represent progression of lymphoma. 2. Numerous masslike pulmonary nodules throughout the lungs, likely metastatic. 3. Confluent consolidation in the left lower lobe and small left effusion, likely superimposed pneumonia. Electronically Signed   By: Kristine Garbe M.D.   On: 09/20/2017 05:51    Discharge Exam: Vitals:   09/25/17 2108 09/26/17 0542  BP: (!) 151/99 124/85  Pulse: 90 85  Resp: 18 18  Temp: 98.6 F (37 C) 98.6 F (37 C)  SpO2: 93% 93%   Vitals:   09/25/17 0507 09/25/17 1357 09/25/17 2108 09/26/17 0542  BP: (!) 146/90 (!) 146/97 (!) 151/99 124/85  Pulse: 75 79 90 85  Resp: 18 20 18 18   Temp: 99 F (37.2 C) 99.1 F (37.3 C) 98.6 F (37 C) 98.6 F (37 C)  TempSrc: Oral Oral Oral Oral  SpO2: 97% 93% 93% 93%  Weight:      Height:        General: Pt is alert, awake, not in acute distress Cardiovascular: RRR, S1/S2 +, no rubs, no gallops Respiratory: Improved aeration b/l, few mild scattered rales, no wheezing. O2   Abdominal: Soft, NT, ND, bowel sounds + Extremities: no edema  The results of significant diagnostics from this  hospitalization (including imaging, microbiology, ancillary and laboratory) are listed below for reference.     Microbiology: Recent Results (from the past 240 hour(s))  Culture, blood (routine x 2)     Status: Abnormal   Collection Time: 09/20/17  3:05 AM  Result Value Ref Range Status   Specimen Description   Final    BLOOD CENTRAL LINE Performed  at [facility], [place] [address], [place], [place] [address]    Special Requests   Final    BOTTLES DRAWN AEROBIC AND ANAEROBIC Blood Culture adequate volume Performed at [facility] [address], [place], [place] [address]    Culture  Setup Time   Final    GRAM POSITIVE RODS Organism ID to follow CRITICAL RESULT CALLED TO, READ BACK BY AND VERIFIED WITH: Lavell Luster PHARMD [id number] 09/23/17 HMILES     Culture (A)  Final    DIPHTHEROIDS(CORYNEBACTERIUM SPECIES) Standardized susceptibility testing for this organism is not available. Performed at [facility], [place] [address], [place], [place] [address]    Report Status 09/25/2017 FINAL  Final  Blood Culture ID Panel (Reflexed)     Status: None   Collection Time: 09/20/17  3:05 AM  Result Value Ref Range Status   Enterococcus species NOT DETECTED NOT DETECTED Final   Listeria monocytogenes NOT DETECTED NOT DETECTED Final   Staphylococcus species NOT DETECTED NOT DETECTED Final   Staphylococcus aureus NOT DETECTED NOT DETECTED Final   Streptococcus species NOT DETECTED NOT DETECTED Final   Streptococcus agalactiae NOT DETECTED NOT DETECTED Final   Streptococcus pneumoniae NOT DETECTED NOT DETECTED Final   Streptococcus pyogenes NOT DETECTED NOT DETECTED Final   Acinetobacter baumannii NOT DETECTED NOT DETECTED Final   Enterobacteriaceae species NOT DETECTED NOT DETECTED Final   Enterobacter cloacae complex NOT DETECTED NOT DETECTED Final   Escherichia coli NOT DETECTED NOT DETECTED Final   Klebsiella oxytoca NOT DETECTED NOT DETECTED Final    Klebsiella pneumoniae NOT DETECTED NOT DETECTED Final   Proteus species NOT DETECTED NOT DETECTED Final   Serratia marcescens NOT DETECTED NOT DETECTED Final   Haemophilus influenzae NOT DETECTED NOT DETECTED Final   Neisseria meningitidis NOT DETECTED NOT DETECTED Final   Pseudomonas aeruginosa NOT DETECTED NOT DETECTED Final   Candida albicans NOT DETECTED NOT DETECTED Final   Candida glabrata NOT DETECTED NOT DETECTED Final   Candida krusei NOT DETECTED NOT DETECTED Final   Candida parapsilosis NOT DETECTED NOT DETECTED Final   Candida tropicalis NOT DETECTED NOT DETECTED Final  Culture, blood (routine x 2)     Status: None   Collection Time: 09/20/17  3:17 AM  Result Value Ref Range Status   Specimen Description   Final    BLOOD CENTRAL LINE Performed at [facility], [place] [address], [place], [place] [address]    Special Requests   Final    BOTTLES DRAWN AEROBIC AND ANAEROBIC Blood Culture adequate volume Performed at [place] [address], [place], [place] [address]    Culture   Final    NO GROWTH 5 DAYS Performed at [facility], [place] [address], [place], [place] [address]    Report Status 09/25/2017 FINAL  Final  Culture, sputum-assessment     Status: None   Collection Time: 09/20/17  9:19 AM  Result Value Ref Range Status   Specimen Description SPUTUM  Final   Special Requests NONE  Final   Sputum evaluation   Final    THIS SPECIMEN IS ACCEPTABLE FOR SPUTUM CULTURE Performed at [facility], [place] [address] [place],  [address]    Report Status 09/21/2017 FINAL  Final  Culture, respiratory     Status: None   Collection Time: 09/20/17  9:19 AM  Result Value Ref Range Status   Specimen Description   Final    SPUTUM  Performed at [facility], [place] [address] [place], [place] [address]    Special Requests   Final    NONE Reflexed from [id number] Performed at [facility] [address] [place], [place] [address]    Gram Stain   Final    RARE WBC PRESENT, PREDOMINANTLY PMN RARE GRAM POSITIVE RODS    Culture   Final    Consistent with normal respiratory flora. Performed at [facility], [place] [address] [place], [place] [address]    Report Status 09/23/2017 FINAL  Final  Urine culture     Status: None   Collection Time: 09/20/17  6:00 PM  Result Value Ref Range Status   Specimen Description   Final    URINE, RANDOM Performed at [facility] [address], [place], [place] [address]    Special Requests   Final    NONE Performed at [facility], [place] [address], [place], [place] [address]    Culture   Final    NO GROWTH Performed at [facility], [place] [address], [place], [place] [address]    Report Status 09/22/2017 FINAL  Final  MRSA PCR Screening     Status: None   Collection Time: 09/21/17 10:30 AM  Result Value Ref Range Status   MRSA by PCR NEGATIVE NEGATIVE Final    Comment:        The GeneXpert MRSA Assay (FDA approved for NASAL specimens only), is one component of a comprehensive MRSA colonization surveillance program. It is not intended to diagnose MRSA infection nor to guide or monitor treatment for MRSA infections. Performed at [facility], [place] [address], [place], [place] [address]      Labs: BNP (last 3 results) No results for input(s): BNP in the last 8760 hours. Basic Metabolic Panel: Recent Labs  Lab 09/21/17 0423 09/22/17 0348 09/24/17 0311 09/25/17 0414 09/26/17 0414  NA 139 142 140 139 140  K 3.7 3.4* 3.4* 3.3* 3.6  CL 105 106 105 106 101  CO2 26 29 27 25 29   GLUCOSE 108* 121* 107* 89 98  BUN 8 9 13 13 13   CREATININE 0.67 0.69 0.63 0.59* 0.68  CALCIUM 7.9* 8.3* 8.1* 7.9* 8.7*  MG  --   --   --   --  2.0   Liver Function Tests: Recent Labs  Lab 09/20/17 0300 09/22/17 0348  AST 27 21  ALT 14 12  ALKPHOS 38  26*  BILITOT 0.9 0.3  PROT 6.0* 4.9*  ALBUMIN 3.4* 2.6*   No results for input(s): LIPASE, AMYLASE in the last 168 hours. No results for input(s): AMMONIA in the last 168 hours. CBC: Recent Labs  Lab 09/20/17 0300  09/22/17 0348 09/23/17 0436 09/24/17 0311 09/25/17 0414 09/26/17 0414  WBC 10.9*   < > 9.3 9.4 7.7 5.6 5.8  NEUTROABS 9.5*  --  8.5* 8.5*  --  4.9 5.2  HGB 12.3*   < > 10.0* 10.9* 10.0* 10.1* 10.8*  HCT 36.7*   < > 29.4* 32.2* 29.5* 29.5* 31.4*  MCV 98.7   < > 98.3 98.5 97.4 97.4 96.3  PLT 127*   < > 120* 147* 123* 97* 114*   < > = values in this interval not displayed.   Cardiac Enzymes: No results for input(s): CKTOTAL, CKMB, CKMBINDEX, TROPONINI in the last 168 hours. BNP: Invalid input(s): POCBNP CBG: No results for input(s): GLUCAP in the last 168  hours. D-Dimer No results for input(s): DDIMER in the last 72 hours. Hgb A1c No results for input(s): HGBA1C in the last 72 hours. Lipid Profile No results for input(s): CHOL, HDL, LDLCALC, TRIG, CHOLHDL, LDLDIRECT in the last 72 hours. Thyroid function studies No results for input(s): TSH, T4TOTAL, T3FREE, THYROIDAB in the last 72 hours.  Invalid input(s): FREET3 Anemia work up No results for input(s): VITAMINB12, FOLATE, FERRITIN, TIBC, IRON, RETICCTPCT in the last 72 hours. Urinalysis    Component Value Date/Time   COLORURINE YELLOW 09/20/2017 1426   APPEARANCEUR CLEAR 09/20/2017 1426   LABSPEC 1.029 09/20/2017 1426   LABSPEC 1.025 10/23/2010 0822   PHURINE 5.0 09/20/2017 1426   GLUCOSEU NEGATIVE 09/20/2017 1426   HGBUR NEGATIVE 09/20/2017 1426   BILIRUBINUR NEGATIVE 09/20/2017 1426   BILIRUBINUR Negative 10/23/2010 0822   KETONESUR NEGATIVE 09/20/2017 1426   PROTEINUR NEGATIVE 09/20/2017 1426   UROBILINOGEN 0.2 11/23/2010 1348   NITRITE NEGATIVE 09/20/2017 1426   LEUKOCYTESUR NEGATIVE 09/20/2017 1426   LEUKOCYTESUR Negative 10/23/2010 0822   Sepsis Labs Invalid input(s): PROCALCITONIN,  WBC,   LACTICIDVEN Microbiology Recent Results (from the past 240 hour(s))  Culture, blood (routine x 2)     Status: Abnormal   Collection Time: 09/20/17  3:05 AM  Result Value Ref Range Status   Specimen Description   Final    BLOOD CENTRAL LINE Performed at [facility], [place] [address], [place], [place] [address]    Special Requests   Final    BOTTLES DRAWN AEROBIC AND ANAEROBIC Blood Culture adequate volume Performed at [facility] [address], [place], [place] [address]    Culture  Setup Time   Final    GRAM POSITIVE RODS Organism ID to follow CRITICAL RESULT CALLED TO, READ BACK BY AND VERIFIED WITH: Lavell Luster PHARMD [id number] 09/23/17 HMILES     Culture (A)  Final    DIPHTHEROIDS(CORYNEBACTERIUM SPECIES) Standardized susceptibility testing for this organism is not available. Performed at [facility], [place] [address], [place], [place] [address]    Report Status 09/25/2017 FINAL  Final  Blood Culture ID Panel (Reflexed)     Status: None   Collection Time: 09/20/17  3:05 AM  Result Value Ref Range Status   Enterococcus species NOT DETECTED NOT DETECTED Final   Listeria monocytogenes NOT DETECTED NOT DETECTED Final   Staphylococcus species NOT DETECTED NOT DETECTED Final   Staphylococcus aureus NOT DETECTED NOT DETECTED Final   Streptococcus species NOT DETECTED NOT DETECTED Final   Streptococcus agalactiae NOT DETECTED NOT DETECTED Final   Streptococcus pneumoniae NOT DETECTED NOT DETECTED Final   Streptococcus pyogenes NOT DETECTED NOT DETECTED Final   Acinetobacter baumannii NOT DETECTED NOT DETECTED Final   Enterobacteriaceae species NOT DETECTED NOT DETECTED Final   Enterobacter cloacae complex NOT DETECTED NOT DETECTED Final   Escherichia coli NOT DETECTED NOT DETECTED Final   Klebsiella oxytoca NOT DETECTED NOT DETECTED Final   Klebsiella pneumoniae NOT DETECTED NOT DETECTED Final   Proteus species NOT DETECTED NOT DETECTED Final    Serratia marcescens NOT DETECTED NOT DETECTED Final   Haemophilus influenzae NOT DETECTED NOT DETECTED Final   Neisseria meningitidis NOT DETECTED NOT DETECTED Final   Pseudomonas aeruginosa NOT DETECTED NOT DETECTED Final   Candida albicans NOT DETECTED NOT DETECTED Final   Candida glabrata NOT DETECTED NOT DETECTED Final   Candida krusei NOT DETECTED NOT DETECTED Final   Candida parapsilosis NOT DETECTED NOT DETECTED Final   Candida tropicalis  NOT DETECTED NOT DETECTED Final  Culture, blood (routine x 2)     Status: None   Collection Time: 09/20/17  3:17 AM  Result Value Ref Range Status   Specimen Description   Final    BLOOD CENTRAL LINE Performed at [facility], [place] [address], [place], [place] [address]    Special Requests   Final    BOTTLES DRAWN AEROBIC AND ANAEROBIC Blood Culture adequate volume Performed at [place] [address] [place], [place] [address]    Culture   Final    NO GROWTH 5 DAYS Performed at [facility], [place] [address] [place], [place] [address]    Report Status 09/25/2017 FINAL  Final  Culture, sputum-assessment     Status: None   Collection Time: 09/20/17  9:19 AM  Result Value Ref Range Status   Specimen Description SPUTUM  Final   Special Requests NONE  Final   Sputum evaluation   Final    THIS SPECIMEN IS ACCEPTABLE FOR SPUTUM CULTURE Performed at [facility], [place] [address] [place], [place] [address]    Report Status 09/21/2017 FINAL  Final  Culture, respiratory     Status: None   Collection Time: 09/20/17  9:19 AM  Result Value Ref Range Status   Specimen Description   Final    SPUTUM Performed at [facility] [address] [place], [place] [address]    Special Requests   Final    NONE Reflexed from [id number] Performed at [facility] [address] [place], [place] [address]    Gram Stain   Final    RARE WBC PRESENT,  PREDOMINANTLY PMN RARE GRAM POSITIVE RODS    Culture   Final    Consistent with normal respiratory flora. Performed at [facility], [place] [address], [place], [place] [address]    Report Status 09/23/2017 FINAL  Final  Urine culture     Status: None   Collection Time: 09/20/17  6:00 PM  Result Value Ref Range Status   Specimen Description   Final    URINE, RANDOM Performed at [facility] [address] [place], [place] [address]    Special Requests   Final    NONE Performed at [facility], [place] [address], [place], [place] [address]    Culture   Final    NO GROWTH Performed at [facility], [place] [address] [place], [place] [address]    Report Status 09/22/2017 FINAL  Final  MRSA PCR Screening     Status: None   Collection Time: 09/21/17 10:30 AM  Result Value Ref Range Status   MRSA by PCR NEGATIVE NEGATIVE Final    Comment:        The GeneXpert MRSA Assay (FDA approved for NASAL specimens only), is one component of a comprehensive MRSA colonization surveillance program. It is not intended to diagnose MRSA infection nor to guide or monitor treatment for MRSA infections. Performed at [facility], [place] [address] [place], [place] [address]    Time coordinating discharge: 32 minutes  SIGNED:  Chipper Oman, MD  [facility] 09/26/2017, 11:53 AM  Pager please text page via  [email or web address]  Note - This record has been created using [employer]. Chart creation errors have been sought, but may not always have been located. Such creation errors do not reflect on the standard of medical care.

## 2017-09-26 NOTE — Care Management Note (Signed)
Case Management Note  Patient Details  Name: [name] MRN: [id number] Date of Birth: 09-Apr-1960  Subjective/Objective: Qualifes for home 02-home order placed-[facility] rep Santiago Glad aware to deliver travel tank to rm prior d/c. No further CM needs.                   Action/Plan:d/c home w/dme   Expected Discharge Date:  09/26/17               Expected Discharge Plan:  Home/Self Care  In-House Referral:     Discharge planning Services  CM Consult  Post Acute Care Choice:    Choice offered to:     DME Arranged:  [facility] D[facility] Agency:  [place]:    [facility] Agency:     Status of Service:  Completed, signed off  If discussed at [place] of Stay Meetings, dates discussed:    Additional Comments:  Dessa Phi, RN 09/26/2017, 12:36 PM

## 2017-09-27 ENCOUNTER — Telehealth: Payer: Self-pay | Admitting: *Deleted

## 2017-09-27 ENCOUNTER — Other Ambulatory Visit: Payer: Self-pay | Admitting: Nurse Practitioner

## 2017-09-27 ENCOUNTER — Telehealth: Payer: Self-pay | Admitting: Oncology

## 2017-09-27 DIAGNOSIS — C8590 Non-Hodgkin lymphoma, unspecified, unspecified site: Secondary | ICD-10-CM

## 2017-09-27 MED ORDER — PREDNISONE 20 MG PO TABS: 40.0000 mg | ORAL_TABLET | Freq: Every day | ORAL | 0 refills | Status: DC

## 2017-09-27 NOTE — Telephone Encounter (Signed)
4:48 pm.  A.P.P. Spoke with Dr. [name].  Verbal orders received and read back from [name] NP for Prednisone 40 mg daily.  Okay to refill Tussionex if pharmacy confirms no current Tussionex order received by patient.  Report to ED if increased S.O.B., cough or hypoxia. Tussionex suspension has been received by patient.  Magda Paganini notified of above orders.  Reviewed signs of Hypoxia, call 911 and calling [facility] at anytime for after hours nurse group and [facility]'s on-call provider.  Asked about Tylenol.  Continue tylenol as needed.  Do not exceed 3,000 mg/24 hrs.  No further questions or needs at this time.         Returned call to [place].  "Otilio Saber increased temperature = 101.86F at 3:40 pm.  Tylenol 500 mg given at this time.  Recently T = 101.65F.  Increased cough, non-productive last night and at times today.  Should I just keep giving Tylenol?  Can he have Tussionex [place] like he received in the hospital?  Discharged without Prednisone.  Can he take Prednisone?  Daughter bought pulse ox.  O2 sat = 88% to 90%.  I increased oxygen to 2.5 L.  He is using Levaquin as ordered."    Notified A.P.P.  Awaiting return call with provider orders and/or instructions.

## 2017-09-27 NOTE — Telephone Encounter (Signed)
Called regarding 9/9 explained about 9/10

## 2017-09-27 NOTE — Telephone Encounter (Signed)
10:20 am Received TC from pt's wife. She states [name] was discharged from the hospital yesterday. She  Is asking about 2 medications that were not ordered at discharge.  The 1st is prednisone-he takes this in conjunction with his chemotherapy. He does not have any at this time.  The 2nd medication is Lorazepam.  This helps him sleep at night. On his discharge summary it listed this medication as being stopped. He needs this to help sleep at night. Please call wife, [name] @ [phone] about these meds please.  They use [employer].

## 2017-09-29 ENCOUNTER — Other Ambulatory Visit: Payer: Self-pay | Admitting: Oncology

## 2017-09-29 DIAGNOSIS — C8590 Non-Hodgkin lymphoma, unspecified, unspecified site: Secondary | ICD-10-CM

## 2017-09-30 ENCOUNTER — Encounter: Payer: Self-pay | Admitting: Nurse Practitioner

## 2017-09-30 ENCOUNTER — Inpatient Hospital Stay: Payer: 59 | Attending: Oncology

## 2017-09-30 ENCOUNTER — Inpatient Hospital Stay (HOSPITAL_BASED_OUTPATIENT_CLINIC_OR_DEPARTMENT_OTHER): Payer: 59 | Admitting: Nurse Practitioner

## 2017-09-30 ENCOUNTER — Inpatient Hospital Stay: Payer: 59

## 2017-09-30 VITALS — BP 132/91 | HR 86 | Temp 98.9°F | Resp 22 | Ht 68.0 in | Wt 181.9 lb

## 2017-09-30 DIAGNOSIS — B370 Candidal stomatitis: Secondary | ICD-10-CM | POA: Insufficient documentation

## 2017-09-30 DIAGNOSIS — Z5111 Encounter for antineoplastic chemotherapy: Secondary | ICD-10-CM | POA: Insufficient documentation

## 2017-09-30 DIAGNOSIS — D701 Agranulocytosis secondary to cancer chemotherapy: Secondary | ICD-10-CM | POA: Diagnosis not present

## 2017-09-30 DIAGNOSIS — C8590 Non-Hodgkin lymphoma, unspecified, unspecified site: Secondary | ICD-10-CM

## 2017-09-30 DIAGNOSIS — C8448 Peripheral T-cell lymphoma, not classified, lymph nodes of multiple sites: Secondary | ICD-10-CM | POA: Insufficient documentation

## 2017-09-30 DIAGNOSIS — J069 Acute upper respiratory infection, unspecified: Secondary | ICD-10-CM

## 2017-09-30 DIAGNOSIS — D6959 Other secondary thrombocytopenia: Secondary | ICD-10-CM | POA: Insufficient documentation

## 2017-09-30 DIAGNOSIS — R740 Nonspecific elevation of levels of transaminase and lactic acid dehydrogenase [LDH]: Secondary | ICD-10-CM | POA: Diagnosis not present

## 2017-09-30 DIAGNOSIS — C8331 Diffuse large B-cell lymphoma, lymph nodes of head, face, and neck: Secondary | ICD-10-CM

## 2017-09-30 DIAGNOSIS — Z95828 Presence of other vascular implants and grafts: Secondary | ICD-10-CM

## 2017-09-30 LAB — LACTATE DEHYDROGENASE: LDH: 528 U/L — AB (ref 98–192)

## 2017-09-30 LAB — CBC WITH DIFFERENTIAL (CANCER CENTER ONLY)
Basophils Absolute: 0.1 10*3/uL (ref 0.0–0.1)
Basophils Relative: 2 %
Eosinophils Absolute: 0.1 10*3/uL (ref 0.0–0.5)
Eosinophils Relative: 2 %
HEMATOCRIT: 34.1 % — AB (ref 38.4–49.9)
Hemoglobin: 11.7 g/dL — ABNORMAL LOW (ref 13.0–17.1)
LYMPHS ABS: 0.1 10*3/uL — AB (ref 0.9–3.3)
LYMPHS PCT: 1 %
MCH: 33.4 pg (ref 27.2–33.4)
MCHC: 34.3 g/dL (ref 32.0–36.0)
MCV: 97.4 fL (ref 79.3–98.0)
MONOS PCT: 6 %
Monocytes Absolute: 0.5 10*3/uL (ref 0.1–0.9)
NEUTROS ABS: 7.5 10*3/uL — AB (ref 1.5–6.5)
Neutrophils Relative %: 89 %
Platelet Count: 137 10*3/uL — ABNORMAL LOW (ref 140–400)
RBC: 3.5 MIL/uL — ABNORMAL LOW (ref 4.20–5.82)
RDW: 15.1 % — ABNORMAL HIGH (ref 11.0–14.6)
WBC Count: 8.5 10*3/uL (ref 4.0–10.3)

## 2017-09-30 LAB — CMP (CANCER CENTER ONLY)
ALBUMIN: 2.6 g/dL — AB (ref 3.5–5.0)
ALK PHOS: 28 U/L — AB (ref 38–126)
ALT: 23 U/L (ref 0–44)
ANION GAP: 12 (ref 5–15)
AST: 28 U/L (ref 15–41)
BUN: 16 mg/dL (ref 6–20)
CALCIUM: 9.1 mg/dL (ref 8.9–10.3)
CHLORIDE: 100 mmol/L (ref 98–111)
CO2: 28 mmol/L (ref 22–32)
Creatinine: 0.74 mg/dL (ref 0.61–1.24)
GFR, Estimated: >60 mL/min (ref >60)
GLUCOSE: 124 mg/dL — AB (ref 70–99)
POTASSIUM: 4.1 mmol/L (ref 3.5–5.1)
SODIUM: 140 mmol/L (ref 135–145)
Total Bilirubin: 0.8 mg/dL (ref 0.3–1.2)
Total Protein: 5.5 g/dL — ABNORMAL LOW (ref 6.5–8.1)

## 2017-09-30 MED ORDER — DEXAMETHASONE 4 MG PO TABS: ORAL_TABLET | ORAL | 3 refills | Status: DC

## 2017-09-30 MED ORDER — SODIUM CHLORIDE 0.9 % IV SOLN
700.0000 mg/m2 | Freq: Once | INTRAVENOUS | Status: AC
  Administered 2017-09-30: 1406 mg via INTRAVENOUS
  Filled 2017-09-30: qty 36.98

## 2017-09-30 MED ORDER — SODIUM CHLORIDE 0.9 % IV SOLN
Freq: Once | INTRAVENOUS | Status: AC
  Administered 2017-09-30: 14:00:00 via INTRAVENOUS
  Filled 2017-09-30: qty 5

## 2017-09-30 MED ORDER — DEXAMETHASONE 4 MG PO TABS
ORAL_TABLET | ORAL | Status: AC
  Filled 2017-09-30: qty 10

## 2017-09-30 MED ORDER — PALONOSETRON HCL INJECTION 0.25 MG/5ML
0.2500 mg | Freq: Once | INTRAVENOUS | Status: AC
  Administered 2017-09-30: 0.25 mg via INTRAVENOUS

## 2017-09-30 MED ORDER — BENZONATATE 100 MG PO CAPS: 100.0000 mg | ORAL_CAPSULE | Freq: Three times a day (TID) | ORAL | 0 refills | Status: AC | PRN

## 2017-09-30 MED ORDER — CLOTRIMAZOLE 10 MG MT TROC: 10.0000 mg | Freq: Every day | OROMUCOSAL | 1 refills | Status: DC

## 2017-09-30 MED ORDER — DEXAMETHASONE 4 MG PO TABS
40.0000 mg | ORAL_TABLET | Freq: Once | ORAL | Status: AC
  Administered 2017-09-30: 40 mg via ORAL

## 2017-09-30 MED ORDER — SODIUM CHLORIDE 0.9% FLUSH
10.0000 mL | INTRAVENOUS | Status: DC | PRN
  Administered 2017-09-30: 10 mL
  Filled 2017-09-30: qty 10

## 2017-09-30 MED ORDER — POTASSIUM CHLORIDE 2 MEQ/ML IV SOLN
Freq: Once | INTRAVENOUS | Status: AC
  Administered 2017-09-30: 10:00:00 via INTRAVENOUS
  Filled 2017-09-30: qty 10

## 2017-09-30 MED ORDER — SODIUM CHLORIDE 0.9% FLUSH
10.0000 mL | Freq: Once | INTRAVENOUS | Status: AC
  Administered 2017-09-30: 10 mL
  Filled 2017-09-30: qty 10

## 2017-09-30 MED ORDER — HEPARIN SOD (PORK) LOCK FLUSH 100 UNIT/ML IV SOLN
500.0000 [IU] | Freq: Once | INTRAVENOUS | Status: AC | PRN
  Administered 2017-09-30: 500 [IU]
  Filled 2017-09-30: qty 5

## 2017-09-30 MED ORDER — SODIUM CHLORIDE 0.9 % IV SOLN
49.5000 mg/m2 | Freq: Once | INTRAVENOUS | Status: AC
  Administered 2017-09-30: 100 mg via INTRAVENOUS
  Filled 2017-09-30: qty 100

## 2017-09-30 MED ORDER — PROCHLORPERAZINE MALEATE 10 MG PO TABS: 10.0000 mg | ORAL_TABLET | Freq: Four times a day (QID) | ORAL | 1 refills | Status: DC | PRN

## 2017-09-30 MED ORDER — PALONOSETRON HCL INJECTION 0.25 MG/5ML
INTRAVENOUS | Status: AC
  Filled 2017-09-30: qty 5

## 2017-09-30 MED ORDER — SODIUM CHLORIDE 0.9 % IV SOLN
Freq: Once | INTRAVENOUS | Status: AC
  Administered 2017-09-30: 10:00:00 via INTRAVENOUS
  Filled 2017-09-30: qty 250

## 2017-09-30 NOTE — Patient Instructions (Signed)
[facility] Discharge Instructions for Patients Receiving Chemotherapy  Today you received the following chemotherapy agents Gemzar, Cisplatin.  To help prevent nausea and vomiting after your treatment, we encourage you to take your nausea medication as prescribed.   If you develop nausea and vomiting that is not controlled by your nausea medication, call the clinic.   BELOW ARE SYMPTOMS THAT SHOULD BE REPORTED IMMEDIATELY:  *FEVER GREATER THAN 100.5 F  *CHILLS WITH OR WITHOUT FEVER  NAUSEA AND VOMITING THAT IS NOT CONTROLLED WITH YOUR NAUSEA MEDICATION  *UNUSUAL SHORTNESS OF BREATH  *UNUSUAL BRUISING OR BLEEDING  TENDERNESS IN MOUTH AND THROAT WITH OR WITHOUT PRESENCE OF ULCERS  *URINARY PROBLEMS  *BOWEL PROBLEMS  UNUSUAL RASH Items with * indicate a potential emergency and should be followed up as soon as possible.  Feel free to call the clinic should you have any questions or concerns. The clinic phone number is [phone].  Please show the [facility] at check-in to the Emergency Department and triage nurse.  Gemcitabine injection (Gemzar) What is this medicine? GEMCITABINE (jem SIT a been) is a chemotherapy drug. This medicine is used to treat many types of cancer like breast cancer, lung cancer, pancreatic cancer, and ovarian cancer. This medicine may be used for other purposes; ask your health care provider or pharmacist if you have questions. COMMON BRAND NAME(S): Gemzar What should I tell my health care provider before I take this medicine? They need to know if you have any of these conditions: -blood disorders -infection -kidney disease -liver disease -recent or ongoing radiation therapy -an unusual or allergic reaction to gemcitabine, other chemotherapy, other medicines, foods, dyes, or preservatives -pregnant or trying to get pregnant -breast-feeding How should I use this medicine? This drug is given as an infusion into a vein. It  is administered in a hospital or clinic by a specially trained health care professional. Talk to your pediatrician regarding the use of this medicine in children. Special care may be needed. Overdosage: If you think you have taken too much of this medicine contact a poison control center or emergency room at once. NOTE: This medicine is only for you. Do not share this medicine with others. What if I miss a dose? It is important not to miss your dose. Call your doctor or health care professional if you are unable to keep an appointment. What may interact with this medicine? -medicines to increase blood counts like filgrastim, pegfilgrastim, sargramostim -some other chemotherapy drugs like cisplatin -vaccines Talk to your doctor or health care professional before taking any of these medicines: -acetaminophen -aspirin -ibuprofen -ketoprofen -naproxen This list may not describe all possible interactions. Give your health care provider a list of all the medicines, herbs, non-prescription drugs, or dietary supplements you use. Also tell them if you smoke, drink alcohol, or use illegal drugs. Some items may interact with your medicine. What should I watch for while using this medicine? Visit your doctor for checks on your progress. This drug may make you feel generally unwell. This is not uncommon, as chemotherapy can affect healthy cells as well as cancer cells. Report any side effects. Continue your course of treatment even though you feel ill unless your doctor tells you to stop. In some cases, you may be given additional medicines to help with side effects. Follow all directions for their use. Call your doctor or health care professional for advice if you get a fever, chills or sore throat, or other symptoms of a  cold or flu. Do not treat yourself. This drug decreases your body's ability to fight infections. Try to avoid being around people who are sick. This medicine may increase your risk to  bruise or bleed. Call your doctor or health care professional if you notice any unusual bleeding. Be careful brushing and flossing your teeth or using a toothpick because you may get an infection or bleed more easily. If you have any dental work done, tell your dentist you are receiving this medicine. Avoid taking products that contain aspirin, acetaminophen, ibuprofen, naproxen, or ketoprofen unless instructed by your doctor. These medicines may hide a fever. Women should inform their doctor if they wish to become pregnant or think they might be pregnant. There is a potential for serious side effects to an unborn child. Talk to your health care professional or pharmacist for more information. Do not breast-feed an infant while taking this medicine. What side effects may I notice from receiving this medicine? Side effects that you should report to your doctor or health care professional as soon as possible: -allergic reactions like skin rash, itching or hives, swelling of the face, lips, or tongue -low blood counts - this medicine may decrease the number of white blood cells, red blood cells and platelets. You may be at increased risk for infections and bleeding. -signs of infection - fever or chills, cough, sore throat, pain or difficulty passing urine -signs of decreased platelets or bleeding - bruising, pinpoint red spots on the skin, black, tarry stools, blood in the urine -signs of decreased red blood cells - unusually weak or tired, fainting spells, lightheadedness -breathing problems -chest pain -mouth sores -nausea and vomiting -pain, swelling, redness at site where injected -pain, tingling, numbness in the hands or feet -stomach pain -swelling of ankles, feet, hands -unusual bleeding Side effects that usually do not require medical attention (report to your doctor or health care professional if they continue or are bothersome): -constipation -diarrhea -hair loss -loss of  appetite -stomach upset This list may not describe all possible side effects. Call your doctor for medical advice about side effects. You may report side effects to FDA at 1-800-FDA-1088. Where should I keep my medicine? This drug is given in a hospital or clinic and will not be stored at home. NOTE: This sheet is a summary. It may not cover all possible information. If you have questions about this medicine, talk to your doctor, pharmacist, or health care provider.  2018 Elsevier/Gold Standard (2007-05-20 18:45:54)   Cisplatin injection What is this medicine? CISPLATIN (SIS pla tin) is a chemotherapy drug. It targets fast dividing cells, like cancer cells, and causes these cells to die. This medicine is used to treat many types of cancer like bladder, ovarian, and testicular cancers. This medicine may be used for other purposes; ask your health care provider or pharmacist if you have questions. COMMON BRAND NAME(S): Platinol, Platinol -AQ What should I tell my health care provider before I take this medicine? They need to know if you have any of these conditions: -blood disorders -hearing problems -kidney disease -recent or ongoing radiation therapy -an unusual or allergic reaction to cisplatin, carboplatin, other chemotherapy, other medicines, foods, dyes, or preservatives -pregnant or trying to get pregnant -breast-feeding How should I use this medicine? This drug is given as an infusion into a vein. It is administered in a hospital or clinic by a specially trained health care professional. Talk to your pediatrician regarding the use of this medicine in children. Special  care may be needed. Overdosage: If you think you have taken too much of this medicine contact a poison control center or emergency room at once. NOTE: This medicine is only for you. Do not share this medicine with others. What if I miss a dose? It is important not to miss a dose. Call your doctor or health care  professional if you are unable to keep an appointment. What may interact with this medicine? -dofetilide -foscarnet -medicines for seizures -medicines to increase blood counts like filgrastim, pegfilgrastim, sargramostim -probenecid -pyridoxine used with altretamine -rituximab -some antibiotics like amikacin, gentamicin, neomycin, polymyxin B, streptomycin, tobramycin -sulfinpyrazone -vaccines -zalcitabine Talk to your doctor or health care professional before taking any of these medicines: -acetaminophen -aspirin -ibuprofen -ketoprofen -naproxen This list may not describe all possible interactions. Give your health care provider a list of all the medicines, herbs, non-prescription drugs, or dietary supplements you use. Also tell them if you smoke, drink alcohol, or use illegal drugs. Some items may interact with your medicine. What should I watch for while using this medicine? Your condition will be monitored carefully while you are receiving this medicine. You will need important blood work done while you are taking this medicine. This drug may make you feel generally unwell. This is not uncommon, as chemotherapy can affect healthy cells as well as cancer cells. Report any side effects. Continue your course of treatment even though you feel ill unless your doctor tells you to stop. In some cases, you may be given additional medicines to help with side effects. Follow all directions for their use. Call your doctor or health care professional for advice if you get a fever, chills or sore throat, or other symptoms of a cold or flu. Do not treat yourself. This drug decreases your body's ability to fight infections. Try to avoid being around people who are sick. This medicine may increase your risk to bruise or bleed. Call your doctor or health care professional if you notice any unusual bleeding. Be careful brushing and flossing your teeth or using a toothpick because you may get an infection  or bleed more easily. If you have any dental work done, tell your dentist you are receiving this medicine. Avoid taking products that contain aspirin, acetaminophen, ibuprofen, naproxen, or ketoprofen unless instructed by your doctor. These medicines may hide a fever. Do not become pregnant while taking this medicine. Women should inform their doctor if they wish to become pregnant or think they might be pregnant. There is a potential for serious side effects to an unborn child. Talk to your health care professional or pharmacist for more information. Do not breast-feed an infant while taking this medicine. Drink fluids as directed while you are taking this medicine. This will help protect your kidneys. Call your doctor or health care professional if you get diarrhea. Do not treat yourself. What side effects may I notice from receiving this medicine? Side effects that you should report to your doctor or health care professional as soon as possible: -allergic reactions like skin rash, itching or hives, swelling of the face, lips, or tongue -signs of infection - fever or chills, cough, sore throat, pain or difficulty passing urine -signs of decreased platelets or bleeding - bruising, pinpoint red spots on the skin, black, tarry stools, nosebleeds -signs of decreased red blood cells - unusually weak or tired, fainting spells, lightheadedness -breathing problems -changes in hearing -gout pain -low blood counts - This drug may decrease the number of white blood cells,  red blood cells and platelets. You may be at increased risk for infections and bleeding. -nausea and vomiting -pain, swelling, redness or irritation at the injection site -pain, tingling, numbness in the hands or feet -problems with balance, movement -trouble passing urine or change in the amount of urine Side effects that usually do not require medical attention (report to your doctor or health care professional if they continue or are  bothersome): -changes in vision -loss of appetite -metallic taste in the mouth or changes in taste This list may not describe all possible side effects. Call your doctor for medical advice about side effects. You may report side effects to FDA at 1-800-FDA-1088. Where should I keep my medicine? This drug is given in a hospital or clinic and will not be stored at home. NOTE: This sheet is a summary. It may not cover all possible information. If you have questions about this medicine, talk to your doctor, pharmacist, or health care provider.  2018 Elsevier/Gold Standard (2007-04-15 14:40:54)

## 2017-09-30 NOTE — Progress Notes (Signed)
Ok to release fluids for treatment without CMP resulting per Lattie Haw, NP   [facility] to proceed with treatment without full urine output per Dr. [name]. Continue to monitor urine.

## 2017-09-30 NOTE — Progress Notes (Signed)
Notified Center with concerns for patient. Regulator is broken at home and his o2 tank for transport/home is empty. [facility] will meet with patient in the infusion room to replace equipment.

## 2017-09-30 NOTE — Progress Notes (Addendum)
[place] OFFICE PROGRESS NOTE   Diagnosis: T-cell lymphoma  INTERVAL HISTORY:   [name] returns as scheduled.  He notes improvement in dyspnea, cough and fever since resuming prednisone 09/27/2017.  Maximum temperature over the weekend 100.8.  This tends to occur about 2 hours prior to the next dose of prednisone.  Oxygen is currently at 4 L.  Oxygen saturation 90% at rest, 88% with exertion.  Objective:  Vital signs in last 24 hours:  Blood pressure (!) 132/91, pulse 86, temperature 98.9 F (37.2 C), temperature source Oral, resp. rate (!) 22, height _0  (1.727 m), weight 181 lb 14.4 oz (82.5 kg), SpO2 94 %.    HEENT: Thrush noted.  No ulcers. Lymphatics: Right axillary lymph node. Resp: Breath sounds diminished at the left lower lung field.  Rhonchi right lower lung field.  No respiratory distress. Cardio: Regular rate and rhythm. GI: Abdomen soft and nontender.  No hepatosplenomegaly. Vascular: No leg edema. Port-A-Cath without erythema.  Lab Results:  Lab Results  Component Value Date   WBC 8.5 09/30/2017   HGB 11.7 (L) 09/30/2017   HCT 34.1 (L) 09/30/2017   MCV 97.4 09/30/2017   PLT 137 (L) 09/30/2017   NEUTROABS 7.5 (H) 09/30/2017    Imaging:  No results found.  Medications: I have reviewed the patient's current medications.  Assessment/Plan: 1. 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  PET scan 03/20/2016 with extensive bulky hypermetabolic nodal activity in the neck, chest, abdomen and pelvis; mild splenomegaly with diffusely mildly accentuated  splenic activity  Staging bone marrow biopsy 03/23/2016-negative for involvement with lymphoma  Cycle 1 EPOCH beginning 03/23/2016  Cycle 2 [facility] 04/13/2016  Restaging PET scan at [facility] 05/08/2016-lymph nodes with various degrees of FDG activity in the neck, axilla, right written him, mesentery, pelvis, and groin. Indeterminate liver lesions without FDG activity, nodular mass in the posterior medial aspect of the left thigh  Cycle 3 EPOCH04/27/2018  Cycle 1 CVP 06/21/2016  Cytoxan/prednisone plus brentuximab 07/17/2016  Cytoxan/prednisone plus brentuximab 08/07/2016 (dose reduced)  Initiation of every three-week brentuximab08/08/2016  brentuximabdose reduced 10/31/2016 secondary to neuropathy  Brentuximab further dose reduced 01/03/2017 secondary to neuropathy  Presentation with parietal scalp nodular lesion 03/08/2017  Staging PET scan 6/38/[id number] hypermetabolic lymphadenopathy in the neck, chest, abdomen/pelvis, and hypermetabolic cutaneous lesions  03/22/2017 CT biopsy left retroperitoneal nodal mass-recurrent CD30 positive T-cell lymphoma  Cycle 1 bendamustine/CPI-613on clinical trial at [facility] 04/08/2017  CTs 05/22/2017 After 2 cycles-mixed response: Enlargement of left supraclavicular node, right axillary node, right pelvic sidewall mass, decreased retroperitoneal abdominal adenopathy  Cycle 3 bendamustine/CPI-613 06/03/2017  Cycle 4 bendamustine/CPI-613 07/09/2017  CT 08/05/2017- increase in the size of a level to a lymph node, decreased size of level 3 and supraclavicular nodes, enlargement of right axillary nodes, new bilateral pulmonary nodules, decreased mediastinal, rectal peritoneal, and iliac nodes  Cycle 5 bendamustine/CPI-6137/16/2019  Cycle 6 bendamustine/CPI-613 09/02/2017  Cycle 1 cisplatin/gemcitabine 09/30/2017 2. 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 He entered clinical remission following chemotherapy and remained in remission when he was seen at the cancer center 02/24/2009.  3. 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 Staging PET scan 08/23/2010 with increased FDG activity at the left tonsillar fossa and no additional evidence of lymphoma. He completed 4 cycles of R-ICE with cycle #1 beginning on 09/19/2010 and cycle #4 on 11/21/2010. Repeat head and neck examination by Dr. Constance Holster following R-ICE/rituximab showed no residual lymphoma. He began radiation consolidation on 12/18/2010, radiation was completed on 01/22/2011. 4. History of neutropenia secondary to rituximab. 5. Anemia secondary to chemotherapy, status post a red blood cell transfusion 11/30/2010. The hemoglobin has normalized. 6. History of mild thrombocytopenia secondary to chemotherapy. 7. Hypertension. 8. 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 05/08/2017-nodular mass in the left thigh adjacent to the femur  MRI 06/07/2016-infiltrative mass in the left thigh adductor muscle, separate from the schwannoma excision site  MRI 10/08/2016-marked improvement in the left adductormusclemass with a small area of enhancement remaining, no discrete mass 9. Port-A-Cath placement 03/21/2016 10. Superficial thrombus left greater saphenous vein 04/13/2016. Lovenox initiated, converted to Xarelto beginning 04/18/2016 11. 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 Admission 09/20/2017 with cough/fever/dyspnea-CT concerning for progression of lymphoma in the lungs versus pneumonia treated with vancomycin/cefepime, started Levaquin 09/25/2017   Disposition: [name] appears stable.  Plan  to proceed with cycle 1 day 1 cisplatin/gemcitabine today as scheduled.  We again reviewed potential toxicities and questions were answered.  He will return in 1 week for the day 8 gemcitabine.    He has oral candidiasis.  He will begin Mycelex troches.  For the cough he will continue Tussionex.  A prescription was sent to his pharmacy for [facility] as well.  He will return for lab, follow-up and day 8 gemcitabine on 10/07/2017.  He will contact the office in the interim with any problems.  Patient seen with Dr. [name].  25 minutes were spent face-to-face at today's visit with the majority of that time involved in counseling/coordination of care.    [name] ANP/GNP-BC   09/30/2017  9:10 AM This was a shared visit with [name].  [name] continues to have a cough, hypoxia, and fever.  His symptoms improved with prednisone.  The LDH is elevated.  The clinical presentation is consistent with progression of the T-cell lymphoma. He will begin gemcitabine, Decadron, and cisplatin therapy today.  We reviewed potential toxicities associated with this regimen and he agrees to proceed.  He will return for an office visit and day 8 gemcitabine on 10/07/2017.  He will contact us in the interim if he develops progression of the respiratory symptoms.  He is scheduled to see Dr. Reita Chard on 10/04/2017.  Julieanne Manson, MD

## 2017-10-01 ENCOUNTER — Ambulatory Visit: Payer: Self-pay

## 2017-10-02 ENCOUNTER — Other Ambulatory Visit: Payer: Self-pay | Admitting: Oncology

## 2017-10-02 DIAGNOSIS — C8590 Non-Hodgkin lymphoma, unspecified, unspecified site: Secondary | ICD-10-CM

## 2017-10-07 ENCOUNTER — Inpatient Hospital Stay: Payer: 59

## 2017-10-07 ENCOUNTER — Telehealth: Payer: Self-pay | Admitting: Oncology

## 2017-10-07 ENCOUNTER — Inpatient Hospital Stay (HOSPITAL_BASED_OUTPATIENT_CLINIC_OR_DEPARTMENT_OTHER): Payer: 59 | Admitting: Oncology

## 2017-10-07 VITALS — BP 118/81 | HR 74 | Temp 98.7°F | Resp 18 | Ht 68.0 in | Wt 174.9 lb

## 2017-10-07 DIAGNOSIS — C8590 Non-Hodgkin lymphoma, unspecified, unspecified site: Secondary | ICD-10-CM

## 2017-10-07 DIAGNOSIS — D6959 Other secondary thrombocytopenia: Secondary | ICD-10-CM | POA: Diagnosis not present

## 2017-10-07 DIAGNOSIS — D701 Agranulocytosis secondary to cancer chemotherapy: Secondary | ICD-10-CM | POA: Diagnosis not present

## 2017-10-07 DIAGNOSIS — C8448 Peripheral T-cell lymphoma, not classified, lymph nodes of multiple sites: Secondary | ICD-10-CM

## 2017-10-07 DIAGNOSIS — Z5111 Encounter for antineoplastic chemotherapy: Secondary | ICD-10-CM | POA: Diagnosis not present

## 2017-10-07 DIAGNOSIS — Z95828 Presence of other vascular implants and grafts: Secondary | ICD-10-CM

## 2017-10-07 LAB — CBC WITH DIFFERENTIAL (CANCER CENTER ONLY)
Basophils Absolute: 0 10*3/uL (ref 0.0–0.1)
Basophils Relative: 0 %
EOS PCT: 8 %
Eosinophils Absolute: 0.1 10*3/uL (ref 0.0–0.5)
HCT: 30.7 % — ABNORMAL LOW (ref 38.4–49.9)
Hemoglobin: 10.6 g/dL — ABNORMAL LOW (ref 13.0–17.1)
LYMPHS ABS: 0 10*3/uL — AB (ref 0.9–3.3)
LYMPHS PCT: 4 %
MCH: 32.4 pg (ref 27.2–33.4)
MCHC: 34.5 g/dL (ref 32.0–36.0)
MCV: 93.9 fL (ref 79.3–98.0)
MONO ABS: 0 10*3/uL — AB (ref 0.1–0.9)
MONOS PCT: 3 %
Neutro Abs: 0.8 10*3/uL — ABNORMAL LOW (ref 1.5–6.5)
Neutrophils Relative %: 85 %
PLATELETS: 66 10*3/uL — AB (ref 140–400)
RBC: 3.27 MIL/uL — ABNORMAL LOW (ref 4.20–5.82)
RDW: 13.8 % (ref 11.0–14.6)
WBC Count: 1 10*3/uL — ABNORMAL LOW (ref 4.0–10.3)

## 2017-10-07 LAB — CMP (CANCER CENTER ONLY)
ALBUMIN: 2.9 g/dL — AB (ref 3.5–5.0)
ALT: 42 U/L (ref 0–44)
AST: 24 U/L (ref 15–41)
Alkaline Phosphatase: 34 U/L — ABNORMAL LOW (ref 38–126)
Anion gap: 11 (ref 5–15)
BUN: 13 mg/dL (ref 6–20)
CHLORIDE: 97 mmol/L — AB (ref 98–111)
CO2: 29 mmol/L (ref 22–32)
Calcium: 9.1 mg/dL (ref 8.9–10.3)
Creatinine: 0.89 mg/dL (ref 0.61–1.24)
GFR, Est AFR Am: >60 mL/min (ref >60)
GFR, Estimated: >60 mL/min (ref >60)
GLUCOSE: 143 mg/dL — AB (ref 70–99)
POTASSIUM: 3.7 mmol/L (ref 3.5–5.1)
Sodium: 137 mmol/L (ref 135–145)
Total Bilirubin: 0.9 mg/dL (ref 0.3–1.2)
Total Protein: 5.7 g/dL — ABNORMAL LOW (ref 6.5–8.1)

## 2017-10-07 MED ORDER — SODIUM CHLORIDE 0.9% FLUSH
10.0000 mL | Freq: Once | INTRAVENOUS | Status: AC
  Administered 2017-10-07: 10 mL
  Filled 2017-10-07: qty 10

## 2017-10-07 MED ORDER — LORAZEPAM 0.5 MG PO TABS: 0.5000 mg | ORAL_TABLET | Freq: Three times a day (TID) | ORAL | 0 refills | Status: DC

## 2017-10-07 NOTE — Progress Notes (Signed)
[place] OFFICE PROGRESS NOTE   Diagnosis: Non-Hodgkin's lymphoma  INTERVAL HISTORY:   [name] returns as scheduled.  He completed cycle 1 gemcitabine/cisplatin 09/30/2017.  No nausea/vomiting, mouth sores, or neuropathy symptoms.  He has noted marked improvement in the dyspnea and cough.  He is now wearing oxygen only at night.  He has noted a decrease in the right axillary lymph node.  He saw [name] at [facility] on 10/04/2017.  He is on the wait list for a CAR-T trial.  Objective:  Vital signs in last 24 hours:  Blood pressure 118/81, pulse 74, temperature 98.7 F (37.1 C), temperature source Oral, resp. rate 18, height '5\' 8"'$  (1.727 m), weight 174 lb 14.4 oz (79.3 kg), SpO2 96 %.    HEENT: No thrush or ulcers Lymphatics: Approximate 2.5 cm right axillary mass Resp: Lungs clear bilaterally, no respiratory distress Cardio: Regular rate and rhythm GI: No hepatosplenomegaly, nontender Vascular: No leg edema   Portacath/PICC-without erythema  Lab Results:  Lab Results  Component Value Date   WBC 1.0 (L) 10/07/2017   HGB 10.6 (L) 10/07/2017   HCT 30.7 (L) 10/07/2017   MCV 93.9 10/07/2017   PLT 66 (L) 10/07/2017   NEUTROABS 0.8 (L) 10/07/2017    CMP  Lab Results  Component Value Date   NA 137 10/07/2017   K 3.7 10/07/2017   CL 97 (L) 10/07/2017   CO2 29 10/07/2017   GLUCOSE 143 (H) 10/07/2017   BUN 13 10/07/2017   CREATININE 0.89 10/07/2017   CALCIUM 9.1 10/07/2017   PROT 5.7 (L) 10/07/2017   ALBUMIN 2.9 (L) 10/07/2017   AST 24 10/07/2017   ALT 42 10/07/2017   ALKPHOS 34 (L) 10/07/2017   BILITOT 0.9 10/07/2017   GFRNONAA >60 10/07/2017   GFRAA >60 10/07/2017   Medications: I have reviewed the patient's current medications.   Assessment/Plan: 1. 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  PET scan 03/20/2016 with extensive bulky hypermetabolic nodal activity in the neck, chest, abdomen and pelvis; mild splenomegaly with diffusely mildly accentuated splenic activity  Staging bone marrow biopsy 03/23/2016-negative for involvement with lymphoma  Cycle 1 EPOCH beginning 03/23/2016  Cycle 2 [facility] 04/13/2016  Restaging PET scan at [facility] 05/08/2016-lymph nodes with various degrees of FDG activity in the neck, axilla, right written him, mesentery, pelvis, and groin. Indeterminate liver lesions without FDG activity, nodular mass in the posterior medial aspect of the left thigh  Cycle 3 EPOCH04/27/2018  Cycle 1 CVP 06/21/2016  Cytoxan/prednisone plus brentuximab 07/17/2016  Cytoxan/prednisone plus brentuximab 08/07/2016 (dose reduced)  Initiation of every three-week brentuximab08/08/2016  brentuximabdose reduced 10/31/2016 secondary to neuropathy  Brentuximab further dose reduced 01/03/2017 secondary to neuropathy  Presentation with parietal scalp nodular lesion 03/08/2017  Staging PET scan 8/65/[id number] hypermetabolic lymphadenopathy in the neck, chest, abdomen/pelvis, and hypermetabolic cutaneous lesions  03/22/2017 CT biopsy left retroperitoneal nodal mass-recurrent CD30 positive T-cell lymphoma  Cycle 1 bendamustine/CPI-613on clinical trial at [facility] 04/08/2017  CTs 05/22/2017 After 2 cycles-mixed response: Enlargement of left supraclavicular node, right axillary node, right pelvic sidewall mass, decreased retroperitoneal abdominal adenopathy  Cycle 3 bendamustine/CPI-613 06/03/2017  Cycle 4 bendamustine/CPI-613 07/09/2017  CT 08/05/2017- increase in  the size of a level to a lymph node, decreased size of level 3 and  supraclavicular nodes, enlargement of right axillary nodes, new bilateral pulmonary nodules, decreased mediastinal, rectal peritoneal, and iliac nodes  Cycle 5 bendamustine/CPI-6137/16/2019  Cycle 6 bendamustine/CPI-613 09/02/2017  Cycle 1 cisplatin/gemcitabine 09/30/2017 by day 8 gemcitabine held secondary to neutropenia/thrombocytopenia 2. 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 He entered clinical remission following chemotherapy and remained in remission when he was seen at the cancer center 02/24/2009. 3. 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 Staging PET scan 08/23/2010 with increased FDG activity at the left tonsillar fossa and no additional evidence of lymphoma. He completed 4 cycles of R-ICE with cycle #1 beginning on 09/19/2010 and cycle #4 on 11/21/2010. Repeat head and neck examination by Dr. Constance Holster following R-ICE/rituximab showed no residual lymphoma. He began radiation consolidation on 12/18/2010, radiation was completed on 01/22/2011. 4. History of neutropenia secondary to rituximab. 5. Anemia secondary to chemotherapy, status post a red blood cell transfusion 11/30/2010. The hemoglobin has normalized. 6. History of mild thrombocytopenia secondary to chemotherapy. 7. Hypertension. 8. 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 05/08/2017-nodular mass in the left thigh adjacent to the femur  MRI 06/07/2016-infiltrative mass in the left thigh adductor muscle, separate from the schwannoma excision site  MRI 10/08/2016-marked improvement in the left adductormusclemass with a small area of enhancement remaining, no discrete mass 9. Port-A-Cath placement  03/21/2016 10. Superficial thrombus left greater saphenous vein 04/13/2016. Lovenox initiated, converted to Xarelto beginning 04/18/2016 11. 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 Admission 09/20/2017 with cough/fever/dyspnea-CT concerning for progression of lymphoma in the lungs versus pneumoniatreated with vancomycin/cefepime, started Levaquin 09/25/2017    Disposition: [name] completed day 1 cycle 1 gemcitabine/cisplatin 09/30/2017.  He tolerated chemotherapy well.  His clinical status has improved significantly over the past week.  Day 8 gemcitabine will be held secondary to neutropenia and thrombocytopenia.  He will return for a CBC on 10/10/2017.  He will contact us for bleeding or a fever.  He plans to continue follow-up with the lymphoma service at [facility] for the CAR-T clinical trial.  He will be scheduled for an office visit and cycle 2 gemcitabine/cisplatin on 10/21/2017.  We will dose reduce the gemcitabine and cisplatin with cycle 2.  25 minutes were spent with the patient today.  The majority of the time was used for counseling and coordination of care.  [name], MD  10/07/2017  10:04 AM

## 2017-10-07 NOTE — Telephone Encounter (Signed)
ppts scheduled VS/Calendar printed per 9/16 los

## 2017-10-10 ENCOUNTER — Encounter: Payer: Self-pay | Admitting: Emergency Medicine

## 2017-10-10 ENCOUNTER — Other Ambulatory Visit: Payer: Self-pay | Admitting: Nurse Practitioner

## 2017-10-10 ENCOUNTER — Inpatient Hospital Stay: Payer: 59

## 2017-10-10 ENCOUNTER — Telehealth: Payer: Self-pay | Admitting: Oncology

## 2017-10-10 ENCOUNTER — Other Ambulatory Visit: Payer: Self-pay | Admitting: Emergency Medicine

## 2017-10-10 DIAGNOSIS — Z5111 Encounter for antineoplastic chemotherapy: Secondary | ICD-10-CM | POA: Diagnosis not present

## 2017-10-10 DIAGNOSIS — C8590 Non-Hodgkin lymphoma, unspecified, unspecified site: Secondary | ICD-10-CM

## 2017-10-10 DIAGNOSIS — Z95828 Presence of other vascular implants and grafts: Secondary | ICD-10-CM

## 2017-10-10 LAB — CBC WITH DIFFERENTIAL (CANCER CENTER ONLY)
Basophils Absolute: 0 10*3/uL (ref 0.0–0.1)
Basophils Relative: 0 %
EOS ABS: 0 10*3/uL (ref 0.0–0.5)
Eosinophils Relative: 7 %
HCT: 28.4 % — ABNORMAL LOW (ref 38.4–49.9)
HEMOGLOBIN: 10 g/dL — AB (ref 13.0–17.1)
LYMPHS ABS: 0 10*3/uL — AB (ref 0.9–3.3)
LYMPHS PCT: 11 %
MCH: 32.7 pg (ref 27.2–33.4)
MCHC: 35.3 g/dL (ref 32.0–36.0)
MCV: 92.9 fL (ref 79.3–98.0)
MONOS PCT: 6 %
Monocytes Absolute: 0 10*3/uL — ABNORMAL LOW (ref 0.1–0.9)
NEUTROS PCT: 76 %
Neutro Abs: 0.3 10*3/uL — CL (ref 1.5–6.5)
Platelet Count: 24 10*3/uL — ABNORMAL LOW (ref 140–400)
RBC: 3.06 MIL/uL — ABNORMAL LOW (ref 4.20–5.82)
RDW: 14.4 % (ref 11.0–14.6)
WBC Count: 0.4 10*3/uL — CL (ref 4.0–10.3)

## 2017-10-10 MED ORDER — LEVOFLOXACIN 500 MG PO TABS: 500.0000 mg | ORAL_TABLET | Freq: Every day | ORAL | 0 refills | Status: DC

## 2017-10-10 MED ORDER — SODIUM CHLORIDE 0.9% FLUSH
10.0000 mL | Freq: Once | INTRAVENOUS | Status: AC
  Administered 2017-10-10: 10 mL
  Filled 2017-10-10: qty 10

## 2017-10-10 NOTE — Telephone Encounter (Signed)
Appts added per 9/19 sch msg

## 2017-10-10 NOTE — Patient Instructions (Signed)
Implanted Port Home Guide An implanted port is a type of central line that is placed under the skin. Central lines are used to provide IV access when treatment or nutrition needs to be given through a person's veins. Implanted ports are used for long-term IV access. An implanted port may be placed because:  You need IV medicine that would be irritating to the small veins in your hands or arms.  You need long-term IV medicines, such as antibiotics.  You need IV nutrition for a long period.  You need frequent blood draws for lab tests.  You need dialysis.  Implanted ports are usually placed in the chest area, but they can also be placed in the upper arm, the abdomen, or the leg. An implanted port has two main parts:  Reservoir. The reservoir is round and will appear as a small, raised area under your skin. The reservoir is the part where a needle is inserted to give medicines or draw blood.  Catheter. The catheter is a thin, flexible tube that extends from the reservoir. The catheter is placed into a large vein. Medicine that is inserted into the reservoir goes into the catheter and then into the vein.  How will I care for my incision site? Do not get the incision site wet. Bathe or shower as directed by your health care provider. How is my port accessed? Special steps must be taken to access the port:  Before the port is accessed, a numbing cream can be placed on the skin. This helps numb the skin over the port site.  Your health care provider uses a sterile technique to access the port. ? Your health care provider must put on a mask and sterile gloves. ? The skin over your port is cleaned carefully with an antiseptic and allowed to dry. ? The port is gently pinched between sterile gloves, and a needle is inserted into the port.  Only "non-coring" port needles should be used to access the port. Once the port is accessed, a blood return should be checked. This helps ensure that the port  is in the vein and is not clogged.  If your port needs to remain accessed for a constant infusion, a clear (transparent) bandage will be placed over the needle site. The bandage and needle will need to be changed every week, or as directed by your health care provider.  Keep the bandage covering the needle clean and dry. Do not get it wet. Follow your health care provider's instructions on how to take a shower or bath while the port is accessed.  If your port does not need to stay accessed, no bandage is needed over the port.  What is flushing? Flushing helps keep the port from getting clogged. Follow your health care provider's instructions on how and when to flush the port. Ports are usually flushed with saline solution or a medicine called heparin. The need for flushing will depend on how the port is used.  If the port is used for intermittent medicines or blood draws, the port will need to be flushed: ? After medicines have been given. ? After blood has been drawn. ? As part of routine maintenance.  If a constant infusion is running, the port may not need to be flushed.  How long will my port stay implanted? The port can stay in for as long as your health care provider thinks it is needed. When it is time for the port to come out, surgery will be   done to remove it. The procedure is similar to the one performed when the port was put in. When should I seek immediate medical care? When you have an implanted port, you should seek immediate medical care if:  You notice a bad smell coming from the incision site.  You have swelling, redness, or drainage at the incision site.  You have more swelling or pain at the port site or the surrounding area.  You have a fever that is not controlled with medicine.  This information is not intended to replace advice given to you by your health care provider. Make sure you discuss any questions you have with your health care provider. Document  Released: 01/08/2005 Document Revised: 06/16/2015 Document Reviewed: 09/15/2012 Elsevier Interactive Patient Education  2017 Elsevier Inc.  

## 2017-10-10 NOTE — Progress Notes (Signed)
Pt came into for labs. Pt has low white count. Denies fever and bleeding today. States he had a temp of 101.5 yesterday but feels fine otherwise. Vitals taken today, afebrile. rx for antibiotic sent to pharmacy. Pt made aware he needs labs tomorrow and Monday. Scheduling message sent for this.

## 2017-10-11 ENCOUNTER — Encounter: Payer: Self-pay | Admitting: Nurse Practitioner

## 2017-10-11 ENCOUNTER — Other Ambulatory Visit: Payer: Self-pay | Admitting: Emergency Medicine

## 2017-10-11 ENCOUNTER — Inpatient Hospital Stay: Payer: 59

## 2017-10-11 ENCOUNTER — Other Ambulatory Visit: Payer: Self-pay | Admitting: Nurse Practitioner

## 2017-10-11 ENCOUNTER — Other Ambulatory Visit: Payer: Self-pay | Admitting: *Deleted

## 2017-10-11 DIAGNOSIS — C8590 Non-Hodgkin lymphoma, unspecified, unspecified site: Secondary | ICD-10-CM

## 2017-10-11 DIAGNOSIS — Z5111 Encounter for antineoplastic chemotherapy: Secondary | ICD-10-CM | POA: Diagnosis not present

## 2017-10-11 LAB — CBC WITH DIFFERENTIAL (CANCER CENTER ONLY)
BASOS ABS: 0 10*3/uL (ref 0.0–0.1)
BLASTS: 0 %
Band Neutrophils: 1 %
Basophils Absolute: 0 10*3/uL (ref 0.0–0.1)
Basophils Relative: 0 %
Basophils Relative: 0 %
EOS PCT: 4 %
Eosinophils Absolute: 0 10*3/uL (ref 0.0–0.5)
Eosinophils Absolute: 0 10*3/uL (ref 0.0–0.5)
Eosinophils Relative: 5 %
HEMATOCRIT: 27.1 % — AB (ref 38.4–49.9)
HEMATOCRIT: 26.9 % — AB (ref 38.4–49.9)
HEMOGLOBIN: 9.2 g/dL — AB (ref 13.0–17.1)
Hemoglobin: 9.3 g/dL — ABNORMAL LOW (ref 13.0–17.1)
LYMPHS ABS: 0.2 10*3/uL — AB (ref 0.9–3.3)
LYMPHS PCT: 43 %
LYMPHS PCT: 19 %
Lymphs Abs: 0.1 10*3/uL — ABNORMAL LOW (ref 0.9–3.3)
MCH: 31.8 pg (ref 27.2–33.4)
MCH: 32.1 pg (ref 27.2–33.4)
MCHC: 34.3 g/dL (ref 32.0–36.0)
MCHC: 34.2 g/dL (ref 32.0–36.0)
MCV: 92.8 fL (ref 79.3–98.0)
MCV: 93.7 fL (ref 79.3–98.0)
MONOS PCT: 0 %
Metamyelocytes Relative: 0 %
Monocytes Absolute: 0 10*3/uL — ABNORMAL LOW (ref 0.1–0.9)
Monocytes Absolute: 0 10*3/uL — ABNORMAL LOW (ref 0.1–0.9)
Monocytes Relative: 11 %
Myelocytes: 0 %
NEUTROS ABS: 0.2 10*3/uL — AB (ref 1.5–6.5)
NEUTROS ABS: 0.2 10*3/uL — AB (ref 1.5–6.5)
NEUTROS PCT: 65 %
Neutrophils Relative %: 52 %
OTHER: 0 %
PLATELETS: 18 10*3/uL — AB (ref 140–400)
Platelet Count: 18 10*3/uL — ABNORMAL LOW (ref 140–400)
Promyelocytes Relative: 0 %
RBC: 2.92 MIL/uL — AB (ref 4.20–5.82)
RBC: 2.87 MIL/uL — AB (ref 4.20–5.82)
RDW: 13.7 % (ref 11.0–14.6)
RDW: 13.8 % (ref 11.0–14.6)
WBC: 0.4 10*3/uL — AB (ref 4.0–10.3)
WBC: 0.4 10*3/uL — AB (ref 4.0–10.3)
nRBC: 0 /100 WBC

## 2017-10-11 LAB — PLATELET COUNT (CANCER CENTER ONLY): Platelet Count: 39 10*3/uL — ABNORMAL LOW (ref 140–400)

## 2017-10-11 LAB — SAMPLE TO BLOOD BANK

## 2017-10-11 LAB — ABO/RH: ABO/RH(D): A POS

## 2017-10-11 MED ORDER — HEPARIN SOD (PORK) LOCK FLUSH 100 UNIT/ML IV SOLN
500.0000 [IU] | Freq: Once | INTRAVENOUS | Status: AC
  Administered 2017-10-11: 500 [IU] via INTRAVENOUS
  Filled 2017-10-11: qty 5

## 2017-10-11 MED ORDER — SODIUM CHLORIDE 0.9% IV SOLUTION
250.0000 mL | Freq: Once | INTRAVENOUS | Status: AC
  Administered 2017-10-11: 250 mL via INTRAVENOUS
  Filled 2017-10-11: qty 250

## 2017-10-11 MED ORDER — SODIUM CHLORIDE 0.9% FLUSH
10.0000 mL | INTRAVENOUS | Status: DC | PRN
  Administered 2017-10-11: 10 mL via INTRAVENOUS
  Filled 2017-10-11: qty 10

## 2017-10-11 NOTE — Patient Instructions (Signed)
Platelet Transfusion, Care After °Refer to this sheet in the next few weeks. These instructions provide you with information about caring for yourself after your procedure. Your health care provider may also give you more specific instructions. Your treatment has been planned according to current medical practices, but problems sometimes occur. Call your health care provider if you have any problems or questions after your procedure. °What can I expect after the procedure? °After the procedure, it is common to have: °· Bruising and soreness at the IV site. °· Fever or chills within the first 48 hours of your transfusion. ° °Follow these instructions at home: °· Take medicines only as directed by your health care provider. Ask your health care provider if you can take an over-the-counter pain reliever in case you have a fever or headache a day or two after your transfusion. °· Return to your normal activities as directed by your health care provider. °Contact a health care provider if: °· You have a fever. °· You have a headache. °· You have redness, swelling, or pain at your IV site. °· You have skin itching or a rash. °· You vomit. °· You feel unusually tired or weak. °Get help right away if: °· You have trouble breathing. °· You have a decreased amount of urine or you urinate less often than you normally do. °· Your urine is darker than normal. °· You have pain in your back, abdomen, or chest. °· You have cool, clammy skin. °· You have a rapid heartbeat. °This information is not intended to replace advice given to you by your health care provider. Make sure you discuss any questions you have with your health care provider. °Document Released: 01/29/2014 Document Revised: 06/16/2015 Document Reviewed: 11/18/2013 °Elsevier Interactive Patient Education © 2018 [employer] ° °

## 2017-10-13 ENCOUNTER — Encounter (HOSPITAL_COMMUNITY): Payer: Self-pay | Admitting: Emergency Medicine

## 2017-10-13 ENCOUNTER — Other Ambulatory Visit: Payer: Self-pay

## 2017-10-13 ENCOUNTER — Emergency Department (HOSPITAL_COMMUNITY): Payer: 59

## 2017-10-13 ENCOUNTER — Inpatient Hospital Stay (HOSPITAL_COMMUNITY)
Admission: EM | Admit: 2017-10-13 | Discharge: 2017-11-02 | DRG: 808 | Disposition: A | Discharge status: Home Health | Payer: 59 | Attending: Internal Medicine | Admitting: Internal Medicine

## 2017-10-13 DIAGNOSIS — D899 Disorder involving the immune mechanism, unspecified: Secondary | ICD-10-CM | POA: Diagnosis not present

## 2017-10-13 DIAGNOSIS — J9621 Acute and chronic respiratory failure with hypoxia: Secondary | ICD-10-CM | POA: Diagnosis present

## 2017-10-13 DIAGNOSIS — A419 Sepsis, unspecified organism: Secondary | ICD-10-CM | POA: Diagnosis present

## 2017-10-13 DIAGNOSIS — Z8572 Personal history of non-Hodgkin lymphomas: Secondary | ICD-10-CM

## 2017-10-13 DIAGNOSIS — R110 Nausea: Secondary | ICD-10-CM | POA: Diagnosis not present

## 2017-10-13 DIAGNOSIS — C8440 Peripheral T-cell lymphoma, not classified, unspecified site: Secondary | ICD-10-CM | POA: Diagnosis present

## 2017-10-13 DIAGNOSIS — E8770 Fluid overload, unspecified: Secondary | ICD-10-CM | POA: Diagnosis not present

## 2017-10-13 DIAGNOSIS — J9600 Acute respiratory failure, unspecified whether with hypoxia or hypercapnia: Secondary | ICD-10-CM

## 2017-10-13 DIAGNOSIS — C8590 Non-Hodgkin lymphoma, unspecified, unspecified site: Secondary | ICD-10-CM | POA: Diagnosis not present

## 2017-10-13 DIAGNOSIS — N179 Acute kidney failure, unspecified: Secondary | ICD-10-CM | POA: Diagnosis present

## 2017-10-13 DIAGNOSIS — D709 Neutropenia, unspecified: Secondary | ICD-10-CM

## 2017-10-13 DIAGNOSIS — R0989 Other specified symptoms and signs involving the circulatory and respiratory systems: Secondary | ICD-10-CM | POA: Diagnosis not present

## 2017-10-13 DIAGNOSIS — R0600 Dyspnea, unspecified: Secondary | ICD-10-CM

## 2017-10-13 DIAGNOSIS — I10 Essential (primary) hypertension: Secondary | ICD-10-CM | POA: Diagnosis present

## 2017-10-13 DIAGNOSIS — J9601 Acute respiratory failure with hypoxia: Secondary | ICD-10-CM | POA: Diagnosis not present

## 2017-10-13 DIAGNOSIS — R112 Nausea with vomiting, unspecified: Secondary | ICD-10-CM | POA: Diagnosis present

## 2017-10-13 DIAGNOSIS — Z9989 Dependence on other enabling machines and devices: Secondary | ICD-10-CM | POA: Diagnosis not present

## 2017-10-13 DIAGNOSIS — R509 Fever, unspecified: Secondary | ICD-10-CM | POA: Diagnosis not present

## 2017-10-13 DIAGNOSIS — Z880 Allergy status to penicillin: Secondary | ICD-10-CM

## 2017-10-13 DIAGNOSIS — R0682 Tachypnea, not elsewhere classified: Secondary | ICD-10-CM

## 2017-10-13 DIAGNOSIS — J90 Pleural effusion, not elsewhere classified: Secondary | ICD-10-CM | POA: Diagnosis present

## 2017-10-13 DIAGNOSIS — C8339 Diffuse large B-cell lymphoma, extranodal and solid organ sites: Secondary | ICD-10-CM | POA: Diagnosis not present

## 2017-10-13 DIAGNOSIS — Z923 Personal history of irradiation: Secondary | ICD-10-CM

## 2017-10-13 DIAGNOSIS — C8592 Non-Hodgkin lymphoma, unspecified, intrathoracic lymph nodes: Secondary | ICD-10-CM | POA: Diagnosis present

## 2017-10-13 DIAGNOSIS — J189 Pneumonia, unspecified organism: Secondary | ICD-10-CM | POA: Diagnosis present

## 2017-10-13 DIAGNOSIS — D849 Immunodeficiency, unspecified: Secondary | ICD-10-CM

## 2017-10-13 DIAGNOSIS — Z9221 Personal history of antineoplastic chemotherapy: Secondary | ICD-10-CM | POA: Diagnosis not present

## 2017-10-13 DIAGNOSIS — D61810 Antineoplastic chemotherapy induced pancytopenia: Principal | ICD-10-CM | POA: Diagnosis present

## 2017-10-13 DIAGNOSIS — D61818 Other pancytopenia: Secondary | ICD-10-CM | POA: Diagnosis present

## 2017-10-13 DIAGNOSIS — Z86718 Personal history of other venous thrombosis and embolism: Secondary | ICD-10-CM

## 2017-10-13 DIAGNOSIS — F419 Anxiety disorder, unspecified: Secondary | ICD-10-CM | POA: Diagnosis present

## 2017-10-13 DIAGNOSIS — D701 Agranulocytosis secondary to cancer chemotherapy: Secondary | ICD-10-CM | POA: Diagnosis not present

## 2017-10-13 DIAGNOSIS — K5900 Constipation, unspecified: Secondary | ICD-10-CM | POA: Diagnosis not present

## 2017-10-13 DIAGNOSIS — R5381 Other malaise: Secondary | ICD-10-CM | POA: Diagnosis not present

## 2017-10-13 DIAGNOSIS — Z79899 Other long term (current) drug therapy: Secondary | ICD-10-CM | POA: Diagnosis not present

## 2017-10-13 DIAGNOSIS — E876 Hypokalemia: Secondary | ICD-10-CM | POA: Diagnosis present

## 2017-10-13 DIAGNOSIS — Z9981 Dependence on supplemental oxygen: Secondary | ICD-10-CM

## 2017-10-13 DIAGNOSIS — B379 Candidiasis, unspecified: Secondary | ICD-10-CM | POA: Diagnosis not present

## 2017-10-13 DIAGNOSIS — C7800 Secondary malignant neoplasm of unspecified lung: Secondary | ICD-10-CM | POA: Diagnosis present

## 2017-10-13 DIAGNOSIS — Z09 Encounter for follow-up examination after completed treatment for conditions other than malignant neoplasm: Secondary | ICD-10-CM

## 2017-10-13 DIAGNOSIS — R5383 Other fatigue: Secondary | ICD-10-CM | POA: Diagnosis not present

## 2017-10-13 DIAGNOSIS — Z87891 Personal history of nicotine dependence: Secondary | ICD-10-CM | POA: Diagnosis not present

## 2017-10-13 DIAGNOSIS — E46 Unspecified protein-calorie malnutrition: Secondary | ICD-10-CM | POA: Diagnosis present

## 2017-10-13 DIAGNOSIS — R739 Hyperglycemia, unspecified: Secondary | ICD-10-CM | POA: Diagnosis not present

## 2017-10-13 DIAGNOSIS — D6959 Other secondary thrombocytopenia: Secondary | ICD-10-CM | POA: Diagnosis present

## 2017-10-13 DIAGNOSIS — R5081 Fever presenting with conditions classified elsewhere: Secondary | ICD-10-CM | POA: Diagnosis not present

## 2017-10-13 DIAGNOSIS — C8442 Peripheral T-cell lymphoma, not classified, intrathoracic lymph nodes: Secondary | ICD-10-CM | POA: Diagnosis present

## 2017-10-13 DIAGNOSIS — T451X5A Adverse effect of antineoplastic and immunosuppressive drugs, initial encounter: Secondary | ICD-10-CM | POA: Diagnosis present

## 2017-10-13 DIAGNOSIS — R6520 Severe sepsis without septic shock: Secondary | ICD-10-CM | POA: Diagnosis not present

## 2017-10-13 DIAGNOSIS — C8330 Diffuse large B-cell lymphoma, unspecified site: Secondary | ICD-10-CM | POA: Diagnosis not present

## 2017-10-13 DIAGNOSIS — M545 Low back pain, unspecified: Secondary | ICD-10-CM

## 2017-10-13 DIAGNOSIS — T380X5A Adverse effect of glucocorticoids and synthetic analogues, initial encounter: Secondary | ICD-10-CM | POA: Diagnosis not present

## 2017-10-13 DIAGNOSIS — Z6826 Body mass index (BMI) 26.0-26.9, adult: Secondary | ICD-10-CM | POA: Diagnosis not present

## 2017-10-13 DIAGNOSIS — R4702 Dysphasia: Secondary | ICD-10-CM | POA: Diagnosis not present

## 2017-10-13 DIAGNOSIS — Z95828 Presence of other vascular implants and grafts: Secondary | ICD-10-CM

## 2017-10-13 DIAGNOSIS — R0902 Hypoxemia: Secondary | ICD-10-CM | POA: Diagnosis not present

## 2017-10-13 DIAGNOSIS — G629 Polyneuropathy, unspecified: Secondary | ICD-10-CM | POA: Diagnosis present

## 2017-10-13 DIAGNOSIS — C8511 Unspecified B-cell lymphoma, lymph nodes of head, face, and neck: Secondary | ICD-10-CM | POA: Diagnosis not present

## 2017-10-13 DIAGNOSIS — R0602 Shortness of breath: Secondary | ICD-10-CM

## 2017-10-13 DIAGNOSIS — C9150 Adult T-cell lymphoma/leukemia (HTLV-1-associated) not having achieved remission: Secondary | ICD-10-CM | POA: Diagnosis not present

## 2017-10-13 DIAGNOSIS — Z7901 Long term (current) use of anticoagulants: Secondary | ICD-10-CM

## 2017-10-13 HISTORY — DX: Non-Hodgkin lymphoma, unspecified, unspecified site: C85.90

## 2017-10-13 LAB — COMPREHENSIVE METABOLIC PANEL
ALBUMIN: 2.8 g/dL — AB (ref 3.5–5.0)
ALK PHOS: 30 U/L — AB (ref 38–126)
ALT: 21 U/L (ref 0–44)
ANION GAP: 12 (ref 5–15)
AST: 29 U/L (ref 15–41)
BILIRUBIN TOTAL: 1.1 mg/dL (ref 0.3–1.2)
BUN: 15 mg/dL (ref 6–20)
CALCIUM: 8.5 mg/dL — AB (ref 8.9–10.3)
CO2: 25 mmol/L (ref 22–32)
Chloride: 102 mmol/L (ref 98–111)
Creatinine, Ser: 1 mg/dL (ref 0.61–1.24)
GFR calc Af Amer: >60 mL/min (ref >60)
GFR calc non Af Amer: >60 mL/min (ref >60)
GLUCOSE: 110 mg/dL — AB (ref 70–99)
Potassium: 3.6 mmol/L (ref 3.5–5.1)
SODIUM: 139 mmol/L (ref 135–145)
TOTAL PROTEIN: 5.8 g/dL — AB (ref 6.5–8.1)

## 2017-10-13 LAB — CBC WITH DIFFERENTIAL/PLATELET
BASOS PCT: 3 %
Basophils Absolute: 0 10*3/uL (ref 0.0–0.1)
EOS PCT: 7 %
Eosinophils Absolute: 0 10*3/uL (ref 0.0–0.7)
HEMATOCRIT: 25.4 % — AB (ref 39.0–52.0)
Hemoglobin: 8.7 g/dL — ABNORMAL LOW (ref 13.0–17.0)
LYMPHS ABS: 0.1 10*3/uL — AB (ref 0.7–4.0)
Lymphocytes Relative: 30 %
MCH: 31.5 pg (ref 26.0–34.0)
MCHC: 34.3 g/dL (ref 30.0–36.0)
MCV: 92 fL (ref 78.0–100.0)
MONO ABS: 0.1 10*3/uL (ref 0.1–1.0)
MONOS PCT: 23 %
Neutro Abs: 0.1 10*3/uL — ABNORMAL LOW (ref 1.7–7.7)
Neutrophils Relative %: 37 %
PLATELETS: 40 10*3/uL — AB (ref 150–400)
RBC: 2.76 MIL/uL — AB (ref 4.22–5.81)
RDW: 13.8 % (ref 11.5–15.5)
WBC: 0.3 10*3/uL — AB (ref 4.0–10.5)

## 2017-10-13 LAB — URINALYSIS, ROUTINE W REFLEX MICROSCOPIC
Bilirubin Urine: NEGATIVE
Glucose, UA: NEGATIVE mg/dL
Hgb urine dipstick: NEGATIVE
Ketones, ur: NEGATIVE mg/dL
Leukocytes, UA: NEGATIVE
Nitrite: NEGATIVE
Protein, ur: 30 mg/dL — AB
SPECIFIC GRAVITY, URINE: 1.02 (ref 1.005–1.030)
pH: 5 (ref 5.0–8.0)

## 2017-10-13 LAB — PREPARE PLATELET PHERESIS: Unit division: 0

## 2017-10-13 LAB — PHOSPHORUS: PHOSPHORUS: 2.9 mg/dL (ref 2.5–4.6)

## 2017-10-13 LAB — I-STAT CG4 LACTIC ACID, ED
LACTIC ACID, VENOUS: 1.05 mmol/L (ref 0.5–1.9)
Lactic Acid, Venous: 1.99 mmol/L — ABNORMAL HIGH (ref 0.5–1.9)

## 2017-10-13 LAB — BPAM PLATELET PHERESIS
Blood Product Expiration Date: 201909202359
ISSUE DATE / TIME: 201909201620
Unit Type and Rh: 6200

## 2017-10-13 LAB — MAGNESIUM: Magnesium: 1.8 mg/dL (ref 1.7–2.4)

## 2017-10-13 LAB — PROTIME-INR
INR: 1.15
Prothrombin Time: 14.6 seconds (ref 11.4–15.2)

## 2017-10-13 MED ORDER — BISACODYL 5 MG PO TBEC
5.0000 mg | DELAYED_RELEASE_TABLET | Freq: Every day | ORAL | Status: DC | PRN
  Administered 2017-10-30: 5 mg via ORAL
  Filled 2017-10-13: qty 1

## 2017-10-13 MED ORDER — RIVAROXABAN 20 MG PO TABS
20.0000 mg | ORAL_TABLET | Freq: Every day | ORAL | Status: DC
  Filled 2017-10-13: qty 1

## 2017-10-13 MED ORDER — PROCHLORPERAZINE MALEATE 10 MG PO TABS: 10.0000 mg | ORAL_TABLET | Freq: Four times a day (QID) | ORAL | Status: DC | PRN

## 2017-10-13 MED ORDER — BENZONATATE 100 MG PO CAPS
100.0000 mg | ORAL_CAPSULE | Freq: Three times a day (TID) | ORAL | Status: DC | PRN
  Administered 2017-10-13 – 2017-10-16 (×6): 100 mg via ORAL
  Filled 2017-10-13 (×6): qty 1

## 2017-10-13 MED ORDER — SENNOSIDES-DOCUSATE SODIUM 8.6-50 MG PO TABS
1.0000 | ORAL_TABLET | Freq: Every evening | ORAL | Status: DC | PRN
  Administered 2017-10-30: 1 via ORAL
  Filled 2017-10-13: qty 1

## 2017-10-13 MED ORDER — LORATADINE 10 MG PO TABS
10.0000 mg | ORAL_TABLET | Freq: Every day | ORAL | Status: DC
  Administered 2017-10-13 – 2017-11-02 (×17): 10 mg via ORAL
  Filled 2017-10-13 (×19): qty 1

## 2017-10-13 MED ORDER — MAGNESIUM CITRATE PO SOLN: 1.0000 | Freq: Once | ORAL | Status: DC | PRN

## 2017-10-13 MED ORDER — ONDANSETRON HCL 4 MG/2ML IJ SOLN
4.0000 mg | Freq: Four times a day (QID) | INTRAMUSCULAR | Status: DC | PRN
  Administered 2017-10-23 – 2017-10-25 (×4): 4 mg via INTRAVENOUS
  Filled 2017-10-13 (×4): qty 2

## 2017-10-13 MED ORDER — SODIUM CHLORIDE 0.9 % IV SOLN
1.0000 g | Freq: Three times a day (TID) | INTRAVENOUS | Status: DC
  Administered 2017-10-14: 1 g via INTRAVENOUS
  Filled 2017-10-13 (×2): qty 1

## 2017-10-13 MED ORDER — ACETAMINOPHEN 650 MG RE SUPP: 650.0000 mg | Freq: Four times a day (QID) | RECTAL | Status: DC | PRN

## 2017-10-13 MED ORDER — METRONIDAZOLE IN NACL 5-0.79 MG/ML-% IV SOLN
500.0000 mg | Freq: Three times a day (TID) | INTRAVENOUS | Status: DC
  Administered 2017-10-13 – 2017-10-14 (×3): 500 mg via INTRAVENOUS
  Filled 2017-10-13 (×3): qty 100

## 2017-10-13 MED ORDER — LIDOCAINE-PRILOCAINE 2.5-2.5 % EX CREA: 1.0000 "application " | TOPICAL_CREAM | CUTANEOUS | Status: DC | PRN

## 2017-10-13 MED ORDER — SODIUM CHLORIDE 0.9 % IV SOLN
2.0000 g | Freq: Once | INTRAVENOUS | Status: AC
  Administered 2017-10-13: 2 g via INTRAVENOUS
  Filled 2017-10-13: qty 2

## 2017-10-13 MED ORDER — ACETAMINOPHEN 325 MG PO TABS
650.0000 mg | ORAL_TABLET | Freq: Four times a day (QID) | ORAL | Status: DC | PRN
  Administered 2017-10-13 – 2017-10-29 (×8): 650 mg via ORAL
  Filled 2017-10-13 (×8): qty 2

## 2017-10-13 MED ORDER — ATENOLOL 50 MG PO TABS
50.0000 mg | ORAL_TABLET | Freq: Every day | ORAL | Status: DC
  Administered 2017-10-13 – 2017-11-02 (×19): 50 mg via ORAL
  Filled 2017-10-13 (×2): qty 2
  Filled 2017-10-13: qty 1
  Filled 2017-10-13 (×2): qty 2
  Filled 2017-10-13 (×2): qty 1
  Filled 2017-10-13 (×4): qty 2
  Filled 2017-10-13: qty 1
  Filled 2017-10-13 (×4): qty 2
  Filled 2017-10-13 (×3): qty 1
  Filled 2017-10-13 (×2): qty 2

## 2017-10-13 MED ORDER — IBUPROFEN 200 MG PO TABS
600.0000 mg | ORAL_TABLET | Freq: Once | ORAL | Status: AC
  Administered 2017-10-13: 600 mg via ORAL
  Filled 2017-10-13: qty 3

## 2017-10-13 MED ORDER — ONDANSETRON HCL 4 MG PO TABS: 4.0000 mg | ORAL_TABLET | Freq: Four times a day (QID) | ORAL | Status: DC | PRN

## 2017-10-13 MED ORDER — SODIUM CHLORIDE 0.9 % IV SOLN
INTRAVENOUS | Status: DC
  Administered 2017-10-13 – 2017-10-14 (×2): via INTRAVENOUS

## 2017-10-13 MED ORDER — IPRATROPIUM-ALBUTEROL 0.5-2.5 (3) MG/3ML IN SOLN: 3.0000 mL | Freq: Four times a day (QID) | RESPIRATORY_TRACT | Status: DC

## 2017-10-13 MED ORDER — LORAZEPAM 0.5 MG PO TABS
0.5000 mg | ORAL_TABLET | Freq: Three times a day (TID) | ORAL | Status: DC
  Administered 2017-10-13 – 2017-10-21 (×18): 0.5 mg via ORAL
  Filled 2017-10-13 (×21): qty 1

## 2017-10-13 MED ORDER — OXYCODONE HCL 5 MG PO TABS
5.0000 mg | ORAL_TABLET | ORAL | Status: DC | PRN
  Administered 2017-10-29 – 2017-11-01 (×6): 5 mg via ORAL
  Filled 2017-10-13 (×6): qty 1

## 2017-10-13 MED ORDER — VANCOMYCIN HCL IN DEXTROSE 1-5 GM/200ML-% IV SOLN
1000.0000 mg | Freq: Once | INTRAVENOUS | Status: AC
  Administered 2017-10-13: 1000 mg via INTRAVENOUS
  Filled 2017-10-13: qty 200

## 2017-10-13 MED ORDER — ZOLPIDEM TARTRATE 5 MG PO TABS: 5.0000 mg | ORAL_TABLET | Freq: Every evening | ORAL | Status: DC | PRN

## 2017-10-13 NOTE — Progress Notes (Signed)
A consult was received from an ED physician for vancomycin and aztreonam per pharmacy dosing.  The patient's profile has been reviewed for ht/wt/allergies/indication/available labs.   A one time order has been placed for vancomycin and aztreonam.    Further antibiotics/pharmacy consults should be ordered by admitting physician if indicated.                       Thank you, Dolly Rias RPh 10/13/2017, 7:03 PM Pager [phone]

## 2017-10-13 NOTE — ED Notes (Addendum)
Notified EDP,Zackowski,MD., pt. I-stat CG4 Lactic acid results 1.99 and RN,Bodi Palmeri made aware.

## 2017-10-13 NOTE — ED Notes (Signed)
Date and time results received: 10/13/17 1746 (use smartphrase ".now" to insert current time)  Test: WBC Critical Value: 0.3  Name of Provider Notified: Zackowski  Orders Received? Or Actions Taken?: Actions Taken: EDP and RN

## 2017-10-13 NOTE — ED Triage Notes (Addendum)
Pt c/o fever x several days and SOB. Pt states his fever has been > 102 at home, pt had acetaminophen 500mg  at 15:00 today. Pt was started on Levaquin 4 days ago.

## 2017-10-13 NOTE — ED Provider Notes (Addendum)
[facility] DEPT Provider Note   CSN: [id number] Arrival date & time: 10/13/17  1539     History   Chief Complaint Chief Complaint  Patient presents with  . Fever    HPI [name] is a 57 y.o. male.   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  Patient is followed by Dr. Malachy Mood.  Patient's been struggling with fevers all week.  And a cough.  Here today temp went up to 102 coughs been worse.  Patient did take some Tylenol at home at about 3 PM.  Temp upon arrival here was 101+.  Patient otherwise states that he does not particularly feel worse.  Patient has completed cycle 1 of gemcitabine and cisplatin on September 9.  He was also evaluated by Dr. Lorenda Peck at [facility] on the 13th of this month is on the wait list for a CAT-T trial.  Patient is platelets were low when he was transfused platelets on Friday.  His white blood cell counts this week have been 0.4.  Clinic has been aware of the fevers.  Patient also started on Levaquin 4 days ago.  He has been taking that.  Patient has a Port-A-Cath in his right anterior chest area.  Here patient's febrile temp 101 and also tachycardic meeting sepsis vital sign criteria.  Patient not hypotensive.  Patient is also been on oxygen this month.  Normally on just a small amount.     Past Medical History:  Diagnosis Date  . History of radiation therapy 12/18/10 to 01/22/11   left oropharynx  . Hypertension   . Lymphoma ([place])   . Neutropenia    Secondary to Rituxan  . Non Hodgkin's lymphoma ([place])    s/p 4 cycles R-ICE, start 11/21/10  . T-cell lymphoma ([place]) 03/14/2016    Patient Active Problem List   Diagnosis Date Noted  . Acute respiratory failure ([place]) 09/20/2017  . HCAP (healthcare-associated pneumonia) 09/20/2017  . Acute respiratory failure with hypoxia ([place]) 09/20/2017  . Port catheter in place 04/03/2016  . Hyperglycemia 03/24/2016  . T-cell lymphoma  ([place]) 03/14/2016  . Schwannoma 10/28/2012  . History of radiation therapy   . Anemia, unspecified 11/30/2010  . Non Hodgkin's lymphoma ([place]) 11/30/2010    Past Surgical History:  Procedure Laterality Date  . BIOPSY PHARYNX     09/20/10 excision right posterior pharynx  . BONE MARROW ASPIRATION      09/08/10 Biopsy , and clot, left iliac crest  . IR GENERIC HISTORICAL  03/21/2016   IR FLUORO GUIDE PORT INSERTION RIGHT 03/21/2016 Arne Cleveland, MD [facility]  . IR GENERIC HISTORICAL  03/21/2016   IR US GUIDE VASC ACCESS RIGHT 03/21/2016 Arne Cleveland, MD [facility]  . PORTACATH PLACEMENT  09/19/10  . TONSILLECTOMY     Bilateral        Home Medications    Prior to Admission medications   Medication Sig Start Date End Date Taking? Authorizing Provider  albuterol (PROVENTIL HFA;VENTOLIN HFA) 108 (90 Base) MCG/ACT inhaler Inhale 2 puffs into the lungs every 6 (six) hours as needed for wheezing or shortness of breath. Patient not taking: Reported on 10/07/2017 09/26/17   Patrecia Pour, Christean Grief, MD  atenolol (TENORMIN) 50 MG tablet Take 50 mg by mouth daily.  08/19/14   [provider]  benzonatate (TESSALON) 100 MG capsule Take 1 capsule (100 mg total) by mouth 3 (three) times daily as needed for cough. 09/30/17   Marcello Moores,  Elby Showers, NP  cetirizine (ZYRTEC) 10 MG tablet Take 10 mg by mouth daily as needed for allergies.    [provider]  chlorpheniramine-HYDROcodone (TUSSIONEX PENNKINETIC ER) 10-8 MG/5ML SUER Take 5 mLs by mouth every 12 (twelve) hours as needed for cough. Patient not taking: Reported on 10/07/2017 09/19/17   Harle Stanford., PA-C  clotrimazole (MYCELEX) 10 MG troche Take 1 tablet (10 mg total) by mouth 5 (five) times daily. Patient not taking: Reported on 10/07/2017 09/30/17   [name], NP  dexamethasone (DECADRON) 4 MG tablet Take 10 tabs daily (40 mg) for 3 days Patient not taking: Reported on 10/07/2017 09/30/17   [name], NP  levofloxacin (LEVAQUIN)  500 MG tablet Take 1 tablet (500 mg total) by mouth daily. 10/10/17   Ladell Pier, MD  lidocaine-prilocaine (EMLA) cream Apply 1 application topically as needed. Apply one hour prior to port access and cover with plastic wrap 03/29/16   Ladell Pier, MD  LORazepam (ATIVAN) 0.5 MG tablet Take 1 tablet (0.5 mg total) by mouth every 8 (eight) hours. 10/07/17   Ladell Pier, MD  ondansetron (ZOFRAN) 8 MG tablet TAKE (1) TABLET BY MOUTH EVERY SIX HOURS AS NEEDED FOR NAUSEA. Patient not taking: Reported on 10/07/2017 10/02/17   Ladell Pier, MD  oxyCODONE-acetaminophen (PERCOCET/ROXICET) 5-325 MG tablet Take 1-2 tablets by mouth every 4 (four) hours as needed for severe pain. Up to 8 tabs daily. Patient not taking: Reported on 10/07/2017 04/03/17   Ladell Pier, MD  predniSONE (DELTASONE) 20 MG tablet Take 2 tablets (40 mg total) by mouth daily with breakfast. Patient not taking: Reported on 10/07/2017 09/27/17   [name], NP  prochlorperazine (COMPAZINE) 10 MG tablet Take 1 tablet (10 mg total) by mouth every 6 (six) hours as needed for nausea or vomiting. 09/30/17   [name], NP  XARELTO 20 MG TABS tablet TAKE 1 TABLET DAILY WITH SUPPER. Patient taking differently: Take 20 mg by mouth daily.  06/03/17   Ladell Pier, MD    Family History History reviewed. No pertinent family history.  Social History Social History   Tobacco Use  . Smoking status: Former Smoker    Packs/day: 0.50    Years: 5.00    Pack years: 2.50    Types: Cigarettes    Last attempt to quit: 11/30/1990    Years since quitting: 26.8  . Smokeless tobacco: Never Used  Substance Use Topics  . Alcohol use: No  . Drug use: No     Allergies   Penicillins   Review of Systems Review of Systems  Constitutional: Positive for fever.  HENT: Negative for congestion.   Respiratory: Positive for cough. Negative for shortness of breath.   Cardiovascular: Negative for chest pain.  Gastrointestinal:  Negative for abdominal pain.  Genitourinary: Negative for dysuria.  Musculoskeletal: Negative for myalgias.  Skin: Negative for rash.  Allergic/Immunologic: Positive for immunocompromised state.  Neurological: Negative for syncope and headaches.  Hematological: Does not bruise/bleed easily.  Psychiatric/Behavioral: Negative for confusion.     Physical Exam Updated Vital Signs BP 115/76   Pulse (!) 104   Temp (!) 101.8 F (38.8 C) (Oral)   Resp 18   SpO2 95%   Physical Exam  Constitutional: He is oriented to person, place, and time. He appears well-developed and well-nourished. No distress.  HENT:  Head: Normocephalic and atraumatic.  Mouth/Throat: Oropharynx is clear and moist.  Eyes: Pupils are equal, round,  and reactive to light. Conjunctivae and EOM are normal.  Neck: Normal range of motion. Neck supple.  Cardiovascular: Normal rate, regular rhythm and normal heart sounds.  Pulmonary/Chest: Effort normal and breath sounds normal. No respiratory distress.  Port-A-Cath right anterior chest no signs of surrounding infection on the skin.  Abdominal: Soft. Bowel sounds are normal.  Musculoskeletal: Normal range of motion. He exhibits no edema.  Neurological: He is alert and oriented to person, place, and time. No cranial nerve deficit or sensory deficit. He exhibits normal muscle tone. Coordination normal.  Skin: Skin is warm.  Nursing note and vitals reviewed.    ED Treatments / Results  Labs (all labs ordered are listed, but only abnormal results are displayed) Labs Reviewed  COMPREHENSIVE METABOLIC PANEL - Abnormal; Notable for the following components:      Result Value   Glucose, Bld 110 (*)    Calcium 8.5 (*)    Total Protein 5.8 (*)    Albumin 2.8 (*)    Alkaline Phosphatase 30 (*)    All other components within normal limits  CBC WITH DIFFERENTIAL/PLATELET - Abnormal; Notable for the following components:   WBC 0.3 (*)    RBC 2.76 (*)    Hemoglobin 8.7 (*)      HCT 25.4 (*)    Platelets 40 (*)    Neutro Abs 0.1 (*)    Lymphs Abs 0.1 (*)    All other components within normal limits  URINALYSIS, ROUTINE W REFLEX MICROSCOPIC - Abnormal; Notable for the following components:   Protein, ur 30 (*)    Bacteria, UA RARE (*)    Non Squamous Epithelial 0-5 (*)    All other components within normal limits  I-STAT CG4 LACTIC ACID, ED - Abnormal; Notable for the following components:   Lactic Acid, Venous 1.99 (*)    All other components within normal limits  CULTURE, BLOOD (ROUTINE X 2)  CULTURE, BLOOD (ROUTINE X 2)  URINE CULTURE  PROTIME-INR  I-STAT CG4 LACTIC ACID, ED  I-STAT CG4 LACTIC ACID, ED    EKG None   ED ECG REPORT   Date: 10/13/2017  Rate: 102  Rhythm: sinus tachycardia  QRS Axis: normal  Intervals: normal  ST/T Wave abnormalities: nonspecific ST changes and nonspecific ST/T changes  Conduction Disutrbances:none  Narrative Interpretation:   Old EKG Reviewed: none available  I have personally reviewed the EKG tracing and agree with the computerized printout as noted.   Radiology Dg Chest 2 View  Result Date: 10/13/2017 CLINICAL DATA:  Fever, lymphoma, shortness of breath EXAM: CHEST - 2 VIEW COMPARISON:  09/24/2017 FINDINGS: Right Port-A-Cath remains in place, unchanged. Innumerable bilateral pulmonary nodules are again noted, stable. Small left effusion, stable. Heart is normal size. No acute bony abnormality. IMPRESSION: Stable innumerable bilateral pulmonary nodules. Small left effusion. No change since prior study. Electronically Signed   By: Rolm Baptise M.D.   On: 10/13/2017 16:56    Procedures Procedures (including critical care time)  CRITICAL CARE Performed by: Fredia Sorrow Total critical care time: 30 minutes Critical care time was exclusive of separately billable procedures and treating other patients. Critical care was necessary to treat or prevent imminent or life-threatening deterioration. Critical  care was time spent personally by me on the following activities: development of treatment plan with patient and/or surrogate as well as nursing, discussions with consultants, evaluation of patient's response to treatment, examination of patient, obtaining history from patient or surrogate, ordering and performing treatments and interventions, ordering and  review of laboratory studies, ordering and review of radiographic studies, pulse oximetry and re-evaluation of patient's condition.   Medications Ordered in ED Medications  aztreonam (AZACTAM) 2 g in sodium chloride 0.9 % 100 mL IVPB (has no administration in time range)  metroNIDAZOLE (FLAGYL) IVPB 500 mg (has no administration in time range)  vancomycin (VANCOCIN) IVPB 1000 mg/200 mL premix (has no administration in time range)     Initial Impression / Assessment and Plan / ED Course  I have reviewed the triage vital signs and the nursing notes.  Pertinent labs & imaging results that were available during my care of the patient were reviewed by me and considered in my medical decision making (see chart for details).    Patient with a fever here.  Discussed with hematology oncology.  They are concerned about the rising of the fever despite being on the Levaquin.  They want to go ahead and do full sepsis antibiotics.  Patient not hypotensive does not not require full fluid resuscitation.  Lactic acid was 1.99.'s of less than 4.  Patient has a penicillin allergy so ordered the alternative for fever of an sepsis of unknown source.  They are considering looking into fungal or antiviral but they will address that tomorrow.  Hospitalist will admit.  Patient currently he is stable.  Urine sent for culture blood cultures have been sent.  Patient's oxygen saturation on low amount of oxygen about 1 L is 94 1096%.  Chest x-ray here today without significant abnormalities.  His white blood cell count is been 0.4 throughout the week today at 0.3.  Patient's  platelets a gotten down to 18,000 on Friday.  He had a platelet transfusion today to 40,000.  Other than the hypoalbuminemia patient without any significant electrolyte abnormalities.   Final Clinical Impressions(s) / ED Diagnoses   Final diagnoses:  Fever, unspecified fever cause  Non-Hodgkin lymphoma of intrathoracic lymph nodes, unspecified non-Hodgkin lymphoma type ([place])  Sepsis, due to unspecified organism [facility])    ED Discharge Orders    None       Fredia Sorrow, MD 10/13/17 [place]    Fredia Sorrow, MD 10/13/17 1919

## 2017-10-13 NOTE — H&P (Signed)
History and Physical   [facility] [place] [facility] [place] Admission History and Physical [employer], D.O.    Patient Name: [name] MR#: [id number] Date of Birth: Apr 05, 1960 Date of Admission: 10/13/2017  Referring MD/NP/PA: Dr. Rogene Houston Primary Care Physician: [name], MD  Chief Complaint:  Chief Complaint  Patient presents with  . Fever    HPI: [name] is a 57 y.o. 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  Presents to the emergency department for evaluation of fevers.  Patient was in a usual state of health until 3 weeks ago he was seen in the emergency department diagnosed with pneumonia and started on a course of Levaquin.  He completed 1 cycle of gemcitabine and cisplatin on September 9.  Comes into the hospital today with a complaint of fluctuating fevers and a cough for the past week.  He was started on a second course of Levaquin 4 days ago.  Temperatures have been as high as 102 and have been refractory to Tylenol which she has been taking every 6 hours.  Of note he had a platelet transfusion this past Friday.  Patient denies loss of appetite, weakness, dizziness, chest pain, shortness of breath, N/V/C/D, abdominal pain, dysuria/frequency, changes in mental status.    Otherwise there has been no change in status. Patient has been taking medication as prescribed and there has been no recent change in medication or diet.  No recent antibiotics.  There has been no recent illness, hospitalizations, travel or sick contacts.    EMS/ED Course: Patient received Azactam, vancomycin, Flagyl the emergency department physician discussed with hematology who requested admission for sepsis work-up and treatment.  They will see the patient in the a.m.   Review of Systems:  CONSTITUTIONAL: Positive fevers  chills.  No fatigue, weakness, weight gain/loss, headache. EYES: No blurry or double vision. ENT: No tinnitus, postnasal drip, redness or soreness of the oropharynx. RESPIRATORY: Positive cough nonproductive.  No dyspnea, wheeze.  No hemoptysis.  CARDIOVASCULAR: No chest pain, palpitations, syncope, orthopnea. No lower extremity edema.  GASTROINTESTINAL: No nausea, vomiting, abdominal pain, diarrhea, constipation.  No hematemesis, melena or hematochezia. GENITOURINARY: No dysuria, frequency, hematuria. ENDOCRINE: No polyuria or nocturia. No heat or cold intolerance. HEMATOLOGY: No anemia, bruising, bleeding. INTEGUMENTARY: No rashes, ulcers, lesions. MUSCULOSKELETAL: No arthritis, gout, NEUROLOGIC: No numbness, tingling, ataxia, seizure-type activity, weakness. PSYCHIATRIC: No anxiety, depression, insomnia.   Past Medical History:  Diagnosis Date  . History of radiation therapy 12/18/10 to 01/22/11   left oropharynx  . Hypertension   . Lymphoma ([place])   . Neutropenia    Secondary to Rituxan  . Non Hodgkin's lymphoma ([place])    s/p 4 cycles R-ICE, start 11/21/10  . T-cell lymphoma ([place]) 03/14/2016    Past Surgical History:  Procedure Laterality Date  . BIOPSY PHARYNX     09/20/10 excision right posterior pharynx  . BONE MARROW ASPIRATION      09/08/10 Biopsy , and clot, left iliac crest  . IR GENERIC HISTORICAL  03/21/2016   IR FLUORO GUIDE PORT INSERTION RIGHT 03/21/2016 Arne Cleveland, MD [facility]  . IR GENERIC HISTORICAL  03/21/2016   IR US GUIDE VASC ACCESS RIGHT 03/21/2016 Arne Cleveland, MD [facility]  . PORTACATH PLACEMENT  09/19/10  . TONSILLECTOMY     Bilateral     reports that he quit smoking  about 26 years ago. His smoking use included cigarettes. He has a 2.50 pack-year smoking history. He has never used smokeless tobacco. He reports that he does not drink alcohol or use drugs.  Allergies  Allergen Reactions  . Penicillins Other (See Comments)    Childhood  allergy reaction unknown  Has patient had a PCN reaction causing immediate rash, facial/tongue/throat swelling, SOB or lightheadedness with hypotension: Unknown Has patient had a PCN reaction causing severe rash involving mucus membranes or skin necrosis: Unknown Has patient had a PCN reaction that required hospitalization: Unknown Has patient had a PCN reaction occurring within the last 10 years: Unknown If all of the above answers are "NO", then may proceed with Cephalosporin use.     History reviewed. No pertinent family history.  Prior to Admission medications   Medication Sig Start Date End Date Taking? Authorizing Provider  albuterol (PROVENTIL HFA;VENTOLIN HFA) 108 (90 Base) MCG/ACT inhaler Inhale 2 puffs into the lungs every 6 (six) hours as needed for wheezing or shortness of breath. Patient not taking: Reported on 10/07/2017 09/26/17   Patrecia Pour, Christean Grief, MD  atenolol (TENORMIN) 50 MG tablet Take 50 mg by mouth daily.  08/19/14   [provider]  benzonatate (TESSALON) 100 MG capsule Take 1 capsule (100 mg total) by mouth 3 (three) times daily as needed for cough. 09/30/17   [name], NP  cetirizine (ZYRTEC) 10 MG tablet Take 10 mg by mouth daily as needed for allergies.    [provider]  chlorpheniramine-HYDROcodone (TUSSIONEX PENNKINETIC ER) 10-8 MG/5ML SUER Take 5 mLs by mouth every 12 (twelve) hours as needed for cough. Patient not taking: Reported on 10/07/2017 09/19/17   Harle Stanford., PA-C  clotrimazole (MYCELEX) 10 MG troche Take 1 tablet (10 mg total) by mouth 5 (five) times daily. Patient not taking: Reported on 10/07/2017 09/30/17   [name], NP  dexamethasone (DECADRON) 4 MG tablet Take 10 tabs daily (40 mg) for 3 days Patient not taking: Reported on 10/07/2017 09/30/17   [name], NP  levofloxacin (LEVAQUIN) 500 MG tablet Take 1 tablet (500 mg total) by mouth daily. 10/10/17   Ladell Pier, MD  lidocaine-prilocaine (EMLA) cream Apply 1  application topically as needed. Apply one hour prior to port access and cover with plastic wrap 03/29/16   Ladell Pier, MD  LORazepam (ATIVAN) 0.5 MG tablet Take 1 tablet (0.5 mg total) by mouth every 8 (eight) hours. 10/07/17   Ladell Pier, MD  ondansetron (ZOFRAN) 8 MG tablet TAKE (1) TABLET BY MOUTH EVERY SIX HOURS AS NEEDED FOR NAUSEA. Patient not taking: Reported on 10/07/2017 10/02/17   Ladell Pier, MD  oxyCODONE-acetaminophen (PERCOCET/ROXICET) 5-325 MG tablet Take 1-2 tablets by mouth every 4 (four) hours as needed for severe pain. Up to 8 tabs daily. Patient not taking: Reported on 10/07/2017 04/03/17   Ladell Pier, MD  predniSONE (DELTASONE) 20 MG tablet Take 2 tablets (40 mg total) by mouth daily with breakfast. Patient not taking: Reported on 10/07/2017 09/27/17   [name], NP  prochlorperazine (COMPAZINE) 10 MG tablet Take 1 tablet (10 mg total) by mouth every 6 (six) hours as needed for nausea or vomiting. 09/30/17   [name], NP  XARELTO 20 MG TABS tablet TAKE 1 TABLET DAILY WITH SUPPER. Patient taking differently: Take 20 mg by mouth daily.  06/03/17   Ladell Pier, MD    Physical Exam: Vitals:   10/13/17 1735 10/13/17  1820 10/13/17 1907 10/13/17 1933  BP: 111/74 115/76 121/73 139/78  Pulse: (!) 105 (!) 104 (!) 105 (!) 108  Resp: 18 18 (!) 22 (!) 27  Temp:    (!) 101.5 F (38.6 C)  TempSrc:    Oral  SpO2: 96% 95% 96% 98%  Weight:   79.4 kg   Height:   5\' 8"  (1.727 m)     GENERAL: 57 y.o.-year-old white male patient, well-developed, well-nourished lying in the bed in no acute distress.  Pleasant and cooperative.   HEENT: Head atraumatic, normocephalic. Pupils equal. Mucus membranes moist. NECK: Supple. No JVD. CHEST: Diminished breath sounds at the left base otherwise normal breath sounds bilaterally. No wheezing, rales, rhonchi or crackles. No use of accessory muscles of respiration.  No reproducible chest wall tenderness.  CARDIOVASCULAR: S1, S2  normal. No murmurs, rubs, or gallops. Cap refill <2 seconds. Pulses intact distally.  ABDOMEN: Soft, nondistended, nontender. No rebound, guarding, rigidity. Normoactive bowel sounds present in all four quadrants.  EXTREMITIES: Mild nonpitting edema bilateral hands and feet No calf tenderness or Homan's sign.  NEUROLOGIC: The patient is alert and oriented x 3. Cranial nerves II through XII are grossly intact with no focal sensorimotor deficit. PSYCHIATRIC:  Normal affect, mood, thought content. SKIN: Warm, dry, and intact without obvious rash, lesion, or ulcer.    Labs on Admission:  CBC: Recent Labs  Lab 10/07/17 0830 10/10/17 0810 10/11/17 0920 10/11/17 1440 10/11/17 1558 10/13/17 1605  WBC 1.0* 0.4* 0.4* 0.4*  --  0.3*  NEUTROABS 0.8* 0.3* 0.2* 0.2*  --  0.1*  HGB 10.6* 10.0* 9.3* 9.2*  --  8.7*  HCT 30.7* 28.4* 27.1* 26.9*  --  25.4*  MCV 93.9 92.9 92.8 93.7  --  92.0  PLT 66* 24* 18* 18* 39* 40*   Basic Metabolic Panel: Recent Labs  Lab 10/07/17 0830 10/13/17 1605  NA 137 139  K 3.7 3.6  CL 97* 102  CO2 29 25  GLUCOSE 143* 110*  BUN 13 15  CREATININE 0.89 1.00  CALCIUM 9.1 8.5*   GFR: Estimated Creatinine Clearance: 79.8 mL/min (by C-G formula based on SCr of 1 mg/dL). Liver Function Tests: Recent Labs  Lab 10/07/17 0830 10/13/17 1605  AST 24 29  ALT 42 21  ALKPHOS 34* 30*  BILITOT 0.9 1.1  PROT 5.7* 5.8*  ALBUMIN 2.9* 2.8*   No results for input(s): LIPASE, AMYLASE in the last 168 hours. No results for input(s): AMMONIA in the last 168 hours. Coagulation Profile: Recent Labs  Lab 10/13/17 1605  INR 1.15   Cardiac Enzymes: No results for input(s): CKTOTAL, CKMB, CKMBINDEX, TROPONINI in the last 168 hours. BNP (last 3 results) No results for input(s): PROBNP in the last 8760 hours. HbA1C: No results for input(s): HGBA1C in the last 72 hours. CBG: No results for input(s): GLUCAP in the last 168 hours. Lipid Profile: No results for input(s):  CHOL, HDL, LDLCALC, TRIG, CHOLHDL, LDLDIRECT in the last 72 hours. Thyroid Function Tests: No results for input(s): TSH, T4TOTAL, FREET4, T3FREE, THYROIDAB in the last 72 hours. Anemia Panel: No results for input(s): VITAMINB12, FOLATE, FERRITIN, TIBC, IRON, RETICCTPCT in the last 72 hours. Urine analysis:    Component Value Date/Time   COLORURINE YELLOW 10/13/2017 [place] 10/13/2017 1605   LABSPEC 1.020 10/13/2017 1605   LABSPEC 1.025 10/23/2010 0822   PHURINE 5.0 10/13/2017 1605   GLUCOSEU NEGATIVE 10/13/2017 1605   HGBUR NEGATIVE 10/13/2017 1605   BILIRUBINUR NEGATIVE  10/13/2017 1605   BILIRUBINUR Negative 10/23/2010 0822   KETONESUR NEGATIVE 10/13/2017 1605   PROTEINUR 30 (A) 10/13/2017 1605   UROBILINOGEN 0.2 11/23/2010 1348   NITRITE NEGATIVE 10/13/2017 1605   LEUKOCYTESUR NEGATIVE 10/13/2017 1605   LEUKOCYTESUR Negative 10/23/2010 0822   Sepsis Labs: @LABRCNTIP (procalcitonin:4,lacticidven:4) )No results found for this or any previous visit (from the past 240 hour(s)).   Radiological Exams on Admission: Dg Chest 2 View  Result Date: 10/13/2017 CLINICAL DATA:  Fever, lymphoma, shortness of breath EXAM: CHEST - 2 VIEW COMPARISON:  09/24/2017 FINDINGS: Right Port-A-Cath remains in place, unchanged. Innumerable bilateral pulmonary nodules are again noted, stable. Small left effusion, stable. Heart is normal size. No acute bony abnormality. IMPRESSION: Stable innumerable bilateral pulmonary nodules. Small left effusion. No change since prior study. Electronically Signed   By: Rolm Baptise M.D.   On: 10/13/2017 16:56    EKG: Sinus tachycardia at 102 bpm with normal axis and nonspecific ST-T wave changes.   Assessment/Plan  This is a 57 y.o. 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 Sepsis secondary to unclear source - Admit to inpatient with telemetry monitoring - IV antibiotics: Azactam, vancomycin,  metronidazole - IV fluid hydration - Follow up blood,urine & sputum cultures - Repeat CBC in am.  -Oncology consultation has been requested.  Please consider infectious disease consultation as well -Antitussives and DuoNeb's as needed  #.  History of hypertension Continue atenolol  #.  History of superficial vein thrombus Continue Xarelto  #. History of anxiety - Continue Ativan  Admission status: Inpatient, telemetry IV Fluids: Normal saline Diet/Nutrition: Heart healthy Consults called: Oncology DVT Px: Relative SCDs and early ambulation. Code Status: Full Code  Disposition Plan: To home in 2-3 days  All the records are reviewed and case discussed with ED provider. Management plans discussed with the patient and/or family who express understanding and agree with plan of care.  Alexsus Papadopoulos D.O. on 10/13/2017 at 7:59 PM CC: Primary care physician; [name], MD   10/13/2017, 7:59 PM

## 2017-10-13 NOTE — Progress Notes (Signed)
Pharmacy Antibiotic Note  [name] is a 57 y.o. male admitted on 10/13/2017 with fevers.  Pt is followed by hematology for non-hodgkins lymphoma.  Pharmacy has been consulted for aztreonam dosing for sepsis.  Plan: azactam 2gm IV x 1 then 1gm q8h Follow renal function, cultures and clinical course  Height: 5\' 8"  (172.7 cm) Weight: 175 lb (79.4 kg) IBW/kg (Calculated) : 68.4  Temp (24hrs), Avg:101.6 F (38.7 C), Min:101.5 F (38.6 C), Max:101.8 F (38.8 C)  Recent Labs  Lab 10/07/17 0830 10/10/17 0810 10/11/17 0920 10/11/17 1440 10/13/17 1605 10/13/17 1654 10/13/17 1818  WBC 1.0* 0.4* 0.4* 0.4* 0.3*  --   --   CREATININE 0.89  --   --   --  1.00  --   --   LATICACIDVEN  --   --   --   --   --  1.99* 1.05    Estimated Creatinine Clearance: 79.8 mL/min (by C-G formula based on SCr of 1 mg/dL).    Allergies  Allergen Reactions  . Penicillins Other (See Comments)    Childhood allergy reaction unknown  Has patient had a PCN reaction causing immediate rash, facial/tongue/throat swelling, SOB or lightheadedness with hypotension: Unknown Has patient had a PCN reaction causing severe rash involving mucus membranes or skin necrosis: Unknown Has patient had a PCN reaction that required hospitalization: Unknown Has patient had a PCN reaction occurring within the last 10 years: Unknown If all of the above answers are "NO", then may proceed with Cephalosporin use.     Antimicrobials this admission: 9/22 aztreonam 9/22 vanc >> 9/22 flagyl >>   Microbiology results: 9/22 BCx:  9/22 UCx:   Thank you for allowing pharmacy to be a part of this patient's care.  Dolly Rias RPh 10/13/2017, 8:32 PM Pager [phone]

## 2017-10-13 NOTE — ED Notes (Signed)
ED TO INPATIENT HANDOFF REPORT  Name/Age/Gender [name] 57 y.o. male  Code Status Code Status History    Date Active Date Inactive Code Status Order ID Comments User Context   09/20/2017 0918 09/26/2017 1638 Full Code [id number]  Mendel Corning, MD Inpatient   05/18/2016 1131 05/18/2016 1158 Full Code [id number]  [name], NP Inpatient   04/13/2016 1119 04/17/2016 1541 Full Code [id number]  [name], NP Inpatient   03/22/2016 0924 03/22/2016 1213 Full Code [id number]  [name], NP Inpatient    Advance Directive Documentation     Most Recent Value  Type of Advance Directive  Healthcare Power of Attorney, Living will  Pre-existing out of facility DNR order (yellow form or pink MOST form)  -  "MOST" Form in Place?  -      Home/SNF/Other Home  Chief Complaint Cancer Patient / Fever 102.4 / Low White Blood Cell Count   Level of Care/Admitting Diagnosis ED Disposition    ED Disposition Condition [place]: [facility] [[id number]]  Level of Care: Telemetry [5]  Admit to tele based on following criteria: Other see comments  Comments: Sepsis  Diagnosis: Sepsis [facility]) [[id number]]  Admitting Physician: Harvie Bridge [[id number]]  Attending Physician: Sherron Monday  Estimated length of stay: past midnight tomorrow  Certification:: I certify this patient will need inpatient services for at least 2 midnights  PT Class (Do Not Modify): Inpatient [101]  PT Acc Code (Do Not Modify): Private [1]       Medical History Past Medical History:  Diagnosis Date  . History of radiation therapy 12/18/10 to 01/22/11   left oropharynx  . Hypertension   . Lymphoma ([place])   . Neutropenia    Secondary to Rituxan  . Non Hodgkin's lymphoma ([place])    s/p 4 cycles R-ICE, start 11/21/10  . T-cell lymphoma ([place]) 03/14/2016    Allergies Allergies  Allergen Reactions  . Penicillins Other (See Comments)    Childhood allergy reaction  unknown  Has patient had a PCN reaction causing immediate rash, facial/tongue/throat swelling, SOB or lightheadedness with hypotension: Unknown Has patient had a PCN reaction causing severe rash involving mucus membranes or skin necrosis: Unknown Has patient had a PCN reaction that required hospitalization: Unknown Has patient had a PCN reaction occurring within the last 10 years: Unknown If all of the above answers are "NO", then may proceed with Cephalosporin use.     IV Location/Drains/Wounds Patient Lines/Drains/Airways Status   Active Line/Drains/Airways    Name:   Placement date:   Placement time:   Site:   Days:   Implanted Port 09/19/10 Right Chest   09/19/10    -    Chest   2581   Implanted Port 03/21/16 Right Chest   03/21/16    1412    Chest   571   Incision 10/30/12 Leg Left;Posterior   10/30/12    1548     1809   Incision (Closed) 03/22/17 Flank Left;Posterior   03/22/17    1236     205          Labs/Imaging Results for orders placed or performed during the hospital encounter of 10/13/17 (from the past 48 hour(s))  Comprehensive metabolic panel     Status: Abnormal   Collection Time: 10/13/17  4:05 PM  Result Value Ref Range   Sodium 139 135 - 145 mmol/L   Potassium 3.6 3.5 - 5.1 mmol/L  Chloride 102 98 - 111 mmol/L   CO2 25 22 - 32 mmol/L   Glucose, Bld 110 (H) 70 - 99 mg/dL   BUN 15 6 - 20 mg/dL   Creatinine, Ser 1.00 0.61 - 1.24 mg/dL   Calcium 8.5 (L) 8.9 - 10.3 mg/dL   Total Protein 5.8 (L) 6.5 - 8.1 g/dL   Albumin 2.8 (L) 3.5 - 5.0 g/dL   AST 29 15 - 41 U/L   ALT 21 0 - 44 U/L   Alkaline Phosphatase 30 (L) 38 - 126 U/L   Total Bilirubin 1.1 0.3 - 1.2 mg/dL   GFR calc non Af Amer >60 >60 mL/min   GFR calc Af Amer >60 >60 mL/min    Comment: (NOTE) The eGFR has been calculated using the CKD EPI equation. This calculation has not been validated in all clinical situations. eGFR's persistently <60 mL/min signify possible Chronic Kidney Disease.    Anion  gap 12 5 - 15    Comment: Performed at [facility], [place] [address], [place], [place] [address]  CBC with Differential     Status: Abnormal   Collection Time: 10/13/17  4:05 PM  Result Value Ref Range   WBC 0.3 (LL) 4.0 - 10.5 K/uL    Comment: RESULT REPEATED AND VERIFIED CRITICAL RESULT CALLED TO, READ BACK BY AND VERIFIED WITH: CLAPP, S AT 1745 ON [id number] BY HOOKER,B    RBC 2.76 (L) 4.22 - 5.81 MIL/uL   Hemoglobin 8.7 (L) 13.0 - 17.0 g/dL   HCT 25.4 (L) 39.0 - 52.0 %   MCV 92.0 78.0 - 100.0 fL   MCH 31.5 26.0 - 34.0 pg   MCHC 34.3 30.0 - 36.0 g/dL   RDW 13.8 11.5 - 15.5 %   Platelets 40 (L) 150 - 400 K/uL    Comment: RESULT REPEATED AND VERIFIED SPECIMEN CHECKED FOR CLOTS PLATELET COUNT CONFIRMED BY SMEAR    Neutrophils Relative % 37 %   Lymphocytes Relative 30 %   Monocytes Relative 23 %   Eosinophils Relative 7 %   Basophils Relative 3 %   Neutro Abs 0.1 (L) 1.7 - 7.7 K/uL   Lymphs Abs 0.1 (L) 0.7 - 4.0 K/uL   Monocytes Absolute 0.1 0.1 - 1.0 K/uL   Eosinophils Absolute 0.0 0.0 - 0.7 K/uL   Basophils Absolute 0.0 0.0 - 0.1 K/uL   Smear Review TOO FEW TO COUNT, SMEAR AVAILABLE FOR REVIEW     Comment: RARE ATYPICAL LYMPOCYTE SEEN ON SCAN MORPHOLOGY UNREMARKABLE Performed at [facility] [address], [place], [place] [address]   Protime-INR     Status: None   Collection Time: 10/13/17  4:05 PM  Result Value Ref Range   Prothrombin Time 14.6 11.4 - 15.2 seconds   INR 1.15     Comment: Performed at [facility], [place] [address], [place], [place] [address]  Urinalysis, Routine w reflex microscopic     Status: Abnormal   Collection Time: 10/13/17  4:05 PM  Result Value Ref Range   Color, Urine YELLOW YELLOW   APPearance CLEAR CLEAR   Specific Gravity, Urine 1.020 1.005 - 1.030   pH 5.0 5.0 - 8.0   Glucose, UA NEGATIVE NEGATIVE mg/dL   Hgb urine dipstick NEGATIVE NEGATIVE   Bilirubin Urine NEGATIVE NEGATIVE    Ketones, ur NEGATIVE NEGATIVE mg/dL   Protein, ur 30 (A) NEGATIVE mg/dL   Nitrite NEGATIVE NEGATIVE   Leukocytes, UA NEGATIVE NEGATIVE   RBC /  HPF 0-5 0 - 5 RBC/hpf   WBC, UA 6-10 0 - 5 WBC/hpf   Bacteria, UA RARE (A) NONE SEEN   Squamous Epithelial / LPF 0-5 0 - 5   Mucus PRESENT    Granular Casts, UA PRESENT    Non Squamous Epithelial 0-5 (A) NONE SEEN    Comment: Performed at [facility], [place] [address], [place], [place] [address]  I-Stat CG4 Lactic Acid, ED     Status: Abnormal   Collection Time: 10/13/17  4:54 PM  Result Value Ref Range   Lactic Acid, Venous 1.99 (H) 0.5 - 1.9 mmol/L   Comment NOTIFIED PHYSICIAN   I-Stat CG4 Lactic Acid, ED     Status: None   Collection Time: 10/13/17  6:18 PM  Result Value Ref Range   Lactic Acid, Venous 1.05 0.5 - 1.9 mmol/L   Dg Chest 2 View  Result Date: 10/13/2017 CLINICAL DATA:  Fever, lymphoma, shortness of breath EXAM: CHEST - 2 VIEW COMPARISON:  09/24/2017 FINDINGS: Right Port-A-Cath remains in place, unchanged. Innumerable bilateral pulmonary nodules are again noted, stable. Small left effusion, stable. Heart is normal size. No acute bony abnormality. IMPRESSION: Stable innumerable bilateral pulmonary nodules. Small left effusion. No change since prior study. Electronically Signed   By: Rolm Baptise M.D.   On: 10/13/2017 16:56    Pending Labs Unresulted Labs (From admission, onward)    Start     Ordered   10/13/17 1829  Urine Culture  STAT,   STAT     10/13/17 1828   10/13/17 1605  Culture, blood (Routine x 2)  BLOOD CULTURE X 2,   STAT     10/13/17 1604   Signed and Held  Magnesium  Add-on,   R     Signed and Held   Signed and Held  Phosphorus  Add-on,   R     Signed and Held   Signed and Held  Basic metabolic panel  Tomorrow morning,   R     Signed and Held   Signed and Held  CBC  Tomorrow morning,   R     Signed and Held   Signed and Held  Culture, sputum-assessment  Once,   R    Question:  Patient  immune status  Answer:  Immunocompromised   Signed and Held          Vitals/Pain Today's Vitals   10/13/17 2000 10/13/17 2004 10/13/17 2015 10/13/17 2030  BP: (!) 138/93   131/90  Pulse: (!) 108  (!) 116 (!) 115  Resp: (!) 25   (!) 31  Temp:  (!) 101.5 F (38.6 C)    TempSrc:      SpO2: 99%  97% 96%  Weight:      Height:      PainSc:        Isolation Precautions No active isolations  Medications Medications  metroNIDAZOLE (FLAGYL) IVPB 500 mg (has no administration in time range)  vancomycin (VANCOCIN) IVPB 1000 mg/200 mL premix (has no administration in time range)  aztreonam (AZACTAM) 1 g in sodium chloride 0.9 % 100 mL IVPB (has no administration in time range)  aztreonam (AZACTAM) 2 g in sodium chloride 0.9 % 100 mL IVPB (2 g Intravenous New Bag/Given 10/13/17 2001)    Mobility walks with person assist

## 2017-10-13 NOTE — Progress Notes (Signed)
Entered room to give patient neb tx as ordered.  After discussion with patient and wife, patient states that he does not want to take tx.s this night.  He would like to speak to the oncologist prior to making a decision regarding taking nebulizer treatments.  This Probation officer spoke to [employer], D. Hill and explained patients request.

## 2017-10-14 ENCOUNTER — Other Ambulatory Visit: Payer: Self-pay

## 2017-10-14 ENCOUNTER — Inpatient Hospital Stay: Payer: 59

## 2017-10-14 DIAGNOSIS — R0989 Other specified symptoms and signs involving the circulatory and respiratory systems: Secondary | ICD-10-CM

## 2017-10-14 DIAGNOSIS — I10 Essential (primary) hypertension: Secondary | ICD-10-CM

## 2017-10-14 DIAGNOSIS — Z923 Personal history of irradiation: Secondary | ICD-10-CM

## 2017-10-14 DIAGNOSIS — D709 Neutropenia, unspecified: Secondary | ICD-10-CM

## 2017-10-14 DIAGNOSIS — Z880 Allergy status to penicillin: Secondary | ICD-10-CM

## 2017-10-14 DIAGNOSIS — Z8572 Personal history of non-Hodgkin lymphomas: Secondary | ICD-10-CM

## 2017-10-14 DIAGNOSIS — C9150 Adult T-cell lymphoma/leukemia (HTLV-1-associated) not having achieved remission: Secondary | ICD-10-CM

## 2017-10-14 DIAGNOSIS — C8511 Unspecified B-cell lymphoma, lymph nodes of head, face, and neck: Secondary | ICD-10-CM

## 2017-10-14 DIAGNOSIS — R5081 Fever presenting with conditions classified elsewhere: Secondary | ICD-10-CM

## 2017-10-14 DIAGNOSIS — Z87891 Personal history of nicotine dependence: Secondary | ICD-10-CM

## 2017-10-14 DIAGNOSIS — Z79899 Other long term (current) drug therapy: Secondary | ICD-10-CM

## 2017-10-14 LAB — CBC
HCT: 23.2 % — ABNORMAL LOW (ref 39.0–52.0)
HEMOGLOBIN: 8.1 g/dL — AB (ref 13.0–17.0)
MCH: 32.7 pg (ref 26.0–34.0)
MCHC: 34.9 g/dL (ref 30.0–36.0)
MCV: 93.5 fL (ref 78.0–100.0)
PLATELETS: 38 10*3/uL — AB (ref 150–400)
RBC: 2.48 MIL/uL — ABNORMAL LOW (ref 4.22–5.81)
RDW: 14 % (ref 11.5–15.5)
WBC: 0.2 10*3/uL — CL (ref 4.0–10.5)

## 2017-10-14 LAB — BASIC METABOLIC PANEL
Anion gap: 8 (ref 5–15)
BUN: 16 mg/dL (ref 6–20)
CALCIUM: 7.8 mg/dL — AB (ref 8.9–10.3)
CO2: 26 mmol/L (ref 22–32)
CREATININE: 0.96 mg/dL (ref 0.61–1.24)
Chloride: 105 mmol/L (ref 98–111)
GFR calc Af Amer: >60 mL/min (ref >60)
GFR calc non Af Amer: >60 mL/min (ref >60)
GLUCOSE: 106 mg/dL — AB (ref 70–99)
Potassium: 3.7 mmol/L (ref 3.5–5.1)
Sodium: 139 mmol/L (ref 135–145)

## 2017-10-14 MED ORDER — TBO-FILGRASTIM 480 MCG/0.8ML ~~LOC~~ SOSY: 480.0000 ug | PREFILLED_SYRINGE | Freq: Every day | SUBCUTANEOUS | Status: DC

## 2017-10-14 MED ORDER — TBO-FILGRASTIM 480 MCG/0.8ML ~~LOC~~ SOSY
480.0000 ug | PREFILLED_SYRINGE | Freq: Every day | SUBCUTANEOUS | Status: DC
  Administered 2017-10-15 – 2017-10-17 (×3): 480 ug via SUBCUTANEOUS
  Filled 2017-10-14 (×4): qty 0.8

## 2017-10-14 MED ORDER — SODIUM CHLORIDE 0.9 % IV SOLN
2.0000 g | Freq: Three times a day (TID) | INTRAVENOUS | Status: DC
  Administered 2017-10-14 – 2017-10-21 (×21): 2 g via INTRAVENOUS
  Filled 2017-10-14 (×22): qty 2

## 2017-10-14 MED ORDER — GUAIFENESIN-DM 100-10 MG/5ML PO SYRP
5.0000 mL | ORAL_SOLUTION | ORAL | Status: DC | PRN
  Administered 2017-10-15 – 2017-10-16 (×3): 5 mL via ORAL
  Filled 2017-10-14 (×3): qty 10

## 2017-10-14 MED ORDER — TBO-FILGRASTIM 300 MCG/0.5ML ~~LOC~~ SOSY
300.0000 ug | PREFILLED_SYRINGE | Freq: Once | SUBCUTANEOUS | Status: AC
  Administered 2017-10-14: 300 ug via SUBCUTANEOUS
  Filled 2017-10-14: qty 0.5

## 2017-10-14 MED ORDER — IPRATROPIUM-ALBUTEROL 0.5-2.5 (3) MG/3ML IN SOLN
3.0000 mL | Freq: Four times a day (QID) | RESPIRATORY_TRACT | Status: DC | PRN
  Administered 2017-10-14 – 2017-10-15 (×2): 3 mL via RESPIRATORY_TRACT
  Filled 2017-10-14 (×2): qty 3

## 2017-10-14 MED ORDER — SODIUM CHLORIDE 0.9 % IV BOLUS
1000.0000 mL | Freq: Once | INTRAVENOUS | Status: AC
  Administered 2017-10-15: 1000 mL via INTRAVENOUS

## 2017-10-14 MED ORDER — IBUPROFEN 200 MG PO TABS
400.0000 mg | ORAL_TABLET | Freq: Three times a day (TID) | ORAL | Status: DC | PRN
  Administered 2017-10-14 – 2017-10-15 (×3): 400 mg via ORAL
  Filled 2017-10-14 (×4): qty 2

## 2017-10-14 MED ORDER — VANCOMYCIN HCL IN DEXTROSE 1-5 GM/200ML-% IV SOLN
1000.0000 mg | Freq: Two times a day (BID) | INTRAVENOUS | Status: DC
  Administered 2017-10-14 – 2017-10-17 (×8): 1000 mg via INTRAVENOUS
  Filled 2017-10-14 (×9): qty 200

## 2017-10-14 MED ORDER — TBO-FILGRASTIM 480 MCG/0.8ML ~~LOC~~ SOSY
480.0000 ug | PREFILLED_SYRINGE | Freq: Every day | SUBCUTANEOUS | Status: DC
  Filled 2017-10-14: qty 0.8

## 2017-10-14 MED ORDER — SODIUM CHLORIDE 0.9 % IV SOLN
2.0000 g | Freq: Once | INTRAVENOUS | Status: AC
  Administered 2017-10-14: 2 g via INTRAVENOUS
  Filled 2017-10-14: qty 2

## 2017-10-14 NOTE — Consult Note (Signed)
[facility] for Infectious Disease    Date of Admission:  10/13/2017           Day 1 vancomycin        Day 1 cefepime        Day 1 metronidazole       Reason for Consult: Febrile neutropenia    Referring Provider: Dr. Gerlean Ren Primary Care Provider: Dr. [name]  Assessment: He has developed febrile neutropenia in the setting of recent chemotherapy for his lymphoma.  He was considered to be septic upon admission because of one elevated lactic acid level and soft blood pressures but he is actually looking fairly well today.  Based on current infectious disease Society guidelines we will continue vancomycin and cefepime but stop metronidazole now.  The source of his fever is not entirely clear although with mild increase in his chronic cough and dyspnea on exertion it could be due to recurrent pneumonia.  I will follow with you.    Plan: 1. Continue vancomycin and cefepime 2. Discontinue metronidazole 3. Monitor his exam, cultures and white blood cell count  Principal Problem:   Febrile neutropenia (HCC) Active Problems:   T-cell lymphoma (HCC)   Sepsis (HCC)   Pancytopenia (HCC)   History of non-Hodgkin's lymphoma   Port catheter in place   Scheduled Meds: . atenolol  50 mg Oral Daily  . loratadine  10 mg Oral Daily  . LORazepam  0.5 mg Oral Q8H  . Tbo-Filgrastim  300 mcg Subcutaneous Once   Continuous Infusions: . sodium chloride 75 mL/hr at 10/13/17 2134  . ceFEPime (MAXIPIME) IV    . metronidazole 500 mg (10/14/17 1105)  . vancomycin 1,000 mg (10/14/17 0918)   PRN Meds:.acetaminophen **OR** acetaminophen, benzonatate, bisacodyl, ibuprofen, ipratropium-albuterol, lidocaine-prilocaine, magnesium citrate, ondansetron **OR** ondansetron (ZOFRAN) IV, oxyCODONE, prochlorperazine, senna-docusate, zolpidem  HPI: [name] is a 57 y.o. male with a remote history of non-Hodgkin's lymphoma in 2005.  He relapsed and was treated again in 2012 with apparent  cure.  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  He has been on a variety of different regimens since that time.  He was admitted to the hospital in late August with fever.  He was treated for possible pneumonia although there was concern that his fever might have been due to progressive lymphoma.  He received a total of 14 days of antibiotic therapy, concluding on 10/04/2017.  He started his first cycle of gemcitabine and cisplatin on 09/30/2017.  He developed pancytopenia and started having fever, chills and sweats again recently.  He started back on levofloxacin on 10/10/2017 but continued to have fever leading to admission yesterday.  His temperature was 102.6 degrees and his total white blood cell count was only 300.  He is noted a slight increase in his chronic dry cough and dyspnea on exertion recently.   Review of Systems: Review of Systems  Constitutional: Positive for chills, diaphoresis, fever, malaise/fatigue and weight loss.  HENT: Negative for congestion and sore throat.   Respiratory: Positive for cough and shortness of breath. Negative for hemoptysis and sputum production.   Cardiovascular: Negative for chest pain.  Gastrointestinal: Negative for abdominal pain, diarrhea, heartburn, nausea and vomiting.  Genitourinary: Negative for dysuria, frequency and urgency.  Musculoskeletal: Negative for back pain, joint pain, myalgias and neck pain.  Skin: Negative for rash.  Neurological: Negative for headaches.    Past Medical  History:  Diagnosis Date  . History of radiation therapy 12/18/10 to 01/22/11   left oropharynx  . Hypertension   . Lymphoma ([place])   . Neutropenia    Secondary to Rituxan  . Non Hodgkin's lymphoma ([place])    s/p 4 cycles R-ICE, start 11/21/10  . T-cell lymphoma ([place]) 03/14/2016    Social History   Tobacco Use  . Smoking status: Former Smoker    Packs/day: 0.50    Years: 5.00    Pack years: 2.50    Types:  Cigarettes    Last attempt to quit: 11/30/1990    Years since quitting: 26.8  . Smokeless tobacco: Never Used  Substance Use Topics  . Alcohol use: No  . Drug use: No    History reviewed. No pertinent family history. Allergies  Allergen Reactions  . Penicillins Other (See Comments)    Childhood allergy reaction unknown  Has patient had a PCN reaction causing immediate rash, facial/tongue/throat swelling, SOB or lightheadedness with hypotension: Unknown Has patient had a PCN reaction causing severe rash involving mucus membranes or skin necrosis: Unknown Has patient had a PCN reaction that required hospitalization: Unknown Has patient had a PCN reaction occurring within the last 10 years: Unknown If all of the above answers are "NO", then may proceed with Cephalosporin use.     OBJECTIVE: Blood pressure 123/81, pulse (!) 101, temperature (!) 100.8 F (38.2 C), temperature source Oral, resp. rate 18, height 5\' 8"  (1.727 m), weight 78.3 kg, SpO2 95 %.  Physical Exam  Constitutional: He is oriented to person, place, and time.  He appears comfortable and in no distress.  He is sitting up in a chair working on his laptop computer.  HENT:  Mouth/Throat: No oropharyngeal exudate.  Eyes: Conjunctivae are normal.  Neck: Neck supple.  Cardiovascular: Normal rate, regular rhythm and normal heart sounds.  No murmur heard. Pulmonary/Chest: Effort normal. He has no wheezes. He has rales.  He is using supplemental nasal prong oxygen.  He has some scattered end inspiratory crackles bilaterally.    Abdominal: Soft. He exhibits no distension. There is no tenderness.  Musculoskeletal: Normal range of motion. He exhibits no edema or tenderness.  Neurological: He is alert and oriented to person, place, and time.  Skin: No rash noted.  Psychiatric: He has a normal mood and affect.    Lab Results Lab Results  Component Value Date   WBC 0.2 (LL) 10/14/2017   HGB 8.1 (L) 10/14/2017   HCT 23.2  (L) 10/14/2017   MCV 93.5 10/14/2017   PLT 38 (L) 10/14/2017    Lab Results  Component Value Date   CREATININE 0.96 10/14/2017   BUN 16 10/14/2017   NA 139 10/14/2017   K 3.7 10/14/2017   CL 105 10/14/2017   CO2 26 10/14/2017    Lab Results  Component Value Date   ALT 21 10/13/2017   AST 29 10/13/2017   ALKPHOS 30 (L) 10/13/2017   BILITOT 1.1 10/13/2017     Microbiology: Recent Results (from the past 240 hour(s))  Culture, blood (Routine x 2)     Status: None (Preliminary result)   Collection Time: 10/13/17  4:05 PM  Result Value Ref Range Status   Specimen Description   Final    PORTA CATH Performed at [facility], [place] [address], [place], [place] [address]    Special Requests   Final    BOTTLES DRAWN AEROBIC AND ANAEROBIC Blood Culture adequate volume Performed at [facility], [place] [address], [place], [place] [address]    Culture   Final    NO GROWTH < 24 HOURS Performed at [facility] [address], [place], [place] [address]    Report Status PENDING  Incomplete    Michel Bickers, MD [facility] [place] [facility] [phone] pager   [phone] cell 10/14/2017, 3:41 PM

## 2017-10-14 NOTE — Progress Notes (Signed)
PROGRESS NOTE    [name]  KGU:[id number] DOB: Feb 15, 1960 DOA: 10/13/2017 PCP: [name], MD   Brief Narrative:  57 year old with a history of non-Hodgkin's lymphoma diagnosed in 2012, T-cell lymphoma diagnosed in February 2018 with metastatic lung disease on home oxygen currently on chemotherapy comes to the hospital for treatment of fevers.  Apparently for the past week or so patient has been having off-and-on fevers at home therefore was started on Levaquin about a week ago but despite of this continued to have fever.  His last chemo cycle was about 2 weeks ago- gemcitabine and cisplatin.  Was also noted to be neutropenic therefore admitted to the hospital for further care.  Pancultures were performed and he was started on vancomycin, aztreonam and Flagyl.  Oncology and infectious disease were consulted.   Assessment & Plan:   Principal Problem:   Sepsis ([place]) Active Problems:   Non Hodgkin's lymphoma ([place])   T-cell lymphoma ([place])   Port catheter in place   Febrile neutropenia ([place])  Sepsis secondary to unclear exact source Febrile neutropenia, moderate to severe - Cultures have been performed-currently pending - Started on vancomycin, aztreonam and Flagyl.  Patient has previously tolerated cefepime therefore will change aztreonam to cefepime.  Infectious disease has been consulted for further assistance. -Oncology will be following. -Nebulizer treatments as necessary.  Supplemental oxygen. -We will add incentive spirometry and flutter valve to enhance coughing. -We will give him a dose of Granix.  Check CBC with differential daily. -He does have a Port-A-Cath in place  History of non-Hodgkin's lymphoma and T-cell lymphoma with metastases - Fever can also board to be related to his underlying malignancy but will still need to rule out an infectious cause given the severity of his neutropenia. -Oncology following.  History of superficial vein thrombosis -On Xarelto.  While  he is thrombocytopenic, will hold off as he was advised this outpatient by Dr. Learta Codding, other defer to onc  Anxiety - On Ativan  Essential hypertension -Continue atenolol  DVT prophylaxis: Xarelto with caution due to neutropenia Code Status: Full code Family Communication: Wife is at bedside Disposition Plan: Maintain inpatient stay for sepsis work-up in the setting of severe neutropenia.  Currently on ongoing chemotherapy.  Consultants:   Oncology  Infectious disease  Procedures:   None  Antimicrobials:   Vancomycin, cefepime and Flagyl   Subjective: Patient states he feels a little better after getting IV fluids and couple of round of antibiotics still has nonproductive cough.  But denies any abdominal pain, dysuria, nausea, vomiting  Review of Systems Otherwise negative except as per HPI, including: General = no fevers, chills, dizziness, malaise, fatigue HEENT/EYES = negative for pain, redness, loss of vision, double vision, blurred vision, loss of hearing, sore throat, hoarseness, dysphagia Cardiovascular= negative for chest pain, palpitation, murmurs, lower extremity swelling Respiratory/lungs= negative for shortness of breath,  hemoptysis, wheezing, mucus production Gastrointestinal= negative for nausea, vomiting,, abdominal pain, melena, hematemesis Genitourinary= negative for Dysuria, Hematuria, Change in Urinary Frequency MSK = Negative for arthralgia, myalgias, Back Pain, Joint swelling  Neurology= Negative for headache, seizures, numbness, tingling  Psychiatry= Negative for anxiety, depression, suicidal and homocidal ideation Allergy/Immunology= Medication/Food allergy as listed  Skin= Negative for Rash, lesions, ulcers, itching   Objective: Vitals:   10/14/17 0137 10/14/17 0445 10/14/17 0853 10/14/17 1009  BP: 108/78 (!) 91/56 (!) 105/58 (!) 95/58  Pulse: 66 64 66 86  Resp: (!) 24 20    Temp: 97.8 F (36.6 C)   99.5  F (37.5 C)  TempSrc: Oral Oral   Oral  SpO2: 96% 97%  96%  Weight:      Height:        Intake/Output Summary (Last 24 hours) at 10/14/2017 1147 Last data filed at 10/13/2017 2120 Gross per 24 hour  Intake -  Output 1 ml  Net -1 ml   Filed Weights   10/13/17 1907 10/13/17 2121  Weight: 79.4 kg 78.3 kg    Examination:  General exam: Appears calm and comfortable  Respiratory system: Clear to auscultation. Respiratory effort normal. Cardiovascular system: S1 & S2 heard, RRR. No JVD, murmurs, rubs, gallops or clicks. No pedal edema. Gastrointestinal system: Abdomen is nondistended, soft and nontender. No organomegaly or masses felt. Normal bowel sounds heard. Central nervous system: Alert and oriented. No focal neurological deficits. Extremities: Symmetric 5 x 5 power. Skin: No rashes, lesions or ulcers Psychiatry: Judgement and insight appear normal. Mood & affect appropriate.  Left chest wall Port-A-Cath in place-no evidence of infection or bleeding.   Data Reviewed:   CBC: Recent Labs  Lab 10/10/17 0810 10/11/17 0920 10/11/17 1440 10/11/17 1558 10/13/17 1605 10/14/17 0338  WBC 0.4* 0.4* 0.4*  --  0.3* 0.2*  NEUTROABS 0.3* 0.2* 0.2*  --  0.1*  --   HGB 10.0* 9.3* 9.2*  --  8.7* 8.1*  HCT 28.4* 27.1* 26.9*  --  25.4* 23.2*  MCV 92.9 92.8 93.7  --  92.0 93.5  PLT 24* 18* 18* 39* 40* 38*   Basic Metabolic Panel: Recent Labs  Lab 10/13/17 1605 10/13/17 2200 10/14/17 0338  NA 139  --  139  K 3.6  --  3.7  CL 102  --  105  CO2 25  --  26  GLUCOSE 110*  --  106*  BUN 15  --  16  CREATININE 1.00  --  0.96  CALCIUM 8.5*  --  7.8*  MG  --  1.8  --   PHOS  --  2.9  --    GFR: Estimated Creatinine Clearance: 83.1 mL/min (by C-G formula based on SCr of 0.96 mg/dL). Liver Function Tests: Recent Labs  Lab 10/13/17 1605  AST 29  ALT 21  ALKPHOS 30*  BILITOT 1.1  PROT 5.8*  ALBUMIN 2.8*   No results for input(s): LIPASE, AMYLASE in the last 168 hours. No results for input(s): AMMONIA in the  last 168 hours. Coagulation Profile: Recent Labs  Lab 10/13/17 1605  INR 1.15   Cardiac Enzymes: No results for input(s): CKTOTAL, CKMB, CKMBINDEX, TROPONINI in the last 168 hours. BNP (last 3 results) No results for input(s): PROBNP in the last 8760 hours. HbA1C: No results for input(s): HGBA1C in the last 72 hours. CBG: No results for input(s): GLUCAP in the last 168 hours. Lipid Profile: No results for input(s): CHOL, HDL, LDLCALC, TRIG, CHOLHDL, LDLDIRECT in the last 72 hours. Thyroid Function Tests: No results for input(s): TSH, T4TOTAL, FREET4, T3FREE, THYROIDAB in the last 72 hours. Anemia Panel: No results for input(s): VITAMINB12, FOLATE, FERRITIN, TIBC, IRON, RETICCTPCT in the last 72 hours. Sepsis Labs: Recent Labs  Lab 10/13/17 1654 10/13/17 1818  LATICACIDVEN 1.99* 1.05    Recent Results (from the past 240 hour(s))  Culture, blood (Routine x 2)     Status: None (Preliminary result)   Collection Time: 10/13/17  4:05 PM  Result Value Ref Range Status   Specimen Description   Final    PORTA CATH Performed at [facility], [address] [place], [place] [address]    Special Requests   Final    BOTTLES DRAWN AEROBIC AND ANAEROBIC Blood Culture adequate volume Performed at [facility] [address] [place], [place] [address]    Culture   Final    NO GROWTH < 12 HOURS Performed at [facility] [address], [place], [place] [address]    Report Status PENDING  Incomplete         Radiology Studies: Dg Chest 2 View  Result Date: 10/13/2017 CLINICAL DATA:  Fever, lymphoma, shortness of breath EXAM: CHEST - 2 VIEW COMPARISON:  09/24/2017 FINDINGS: Right Port-A-Cath remains in place, unchanged. Innumerable bilateral pulmonary nodules are again noted, stable. Small left effusion, stable. Heart is normal size. No acute bony abnormality. IMPRESSION: Stable innumerable bilateral pulmonary nodules. Small left  effusion. No change since prior study. Electronically Signed   By: Rolm Baptise M.D.   On: 10/13/2017 16:56        Scheduled Meds: . atenolol  50 mg Oral Daily  . loratadine  10 mg Oral Daily  . LORazepam  0.5 mg Oral Q8H  . rivaroxaban  20 mg Oral Daily  . Tbo-Filgrastim  300 mcg Subcutaneous Once   Continuous Infusions: . sodium chloride 75 mL/hr at 10/13/17 2134  . ceFEPime (MAXIPIME) IV    . ceFEPime (MAXIPIME) IV    . metronidazole 500 mg (10/14/17 1105)  . vancomycin 1,000 mg (10/14/17 0918)     LOS: 1 day   Time spent= 40 mins    Ankit Arsenio Loader, MD [facility] Pager [phone]   If 7PM-7AM, please contact night-coverage [email or web address] Password [facility] 10/14/2017, 11:47 AM

## 2017-10-14 NOTE — Progress Notes (Signed)
HEMATOLOGY-ONCOLOGY PROGRESS NOTE  SUBJECTIVE:Admission for neutropenic fever inspite of outpatient therapy with levaquin. Fevers this afternoon as well. ID consulted. On Zosyn and Vancomycin.  OBJECTIVE: REVIEW OF SYSTEMS:   Constitutional: Fevers Eyes: Denies blurriness of vision Ears, nose, mouth, throat, and face: Cough dry Respiratory: Cough Cardiovascular: Denies palpitation, chest discomfort Gastrointestinal:  Denies nausea, heartburn or change in bowel habits Skin: Denies abnormal skin rashes Lymphatics: Denies new lymphadenopathy or easy bruising Neurological:Denies numbness, tingling or new weaknesses Behavioral/Psych: Mood is stable, no new changes  Extremities: No lower extremity edema  All other systems were reviewed with the patient and are negative.    PHYSICAL EXAMINATION: ECOG PERFORMANCE STATUS: 2 - Symptomatic, <50% confined to bed  Vitals:   10/14/17 1429 10/14/17 1701  BP:  (!) 97/57  Pulse:  88  Resp:  18  Temp: (!) 100.8 F (38.2 C) 99.4 F (37.4 C)  SpO2:     Filed Weights   10/13/17 1907 10/13/17 2121  Weight: 175 lb (79.4 kg) 172 lb 9.6 oz (78.3 kg)    GENERAL:alert, no distress and comfortable SKIN: skin color, texture, turgor are normal, no rashes or significant lesions EYES: normal, Conjunctiva are pink and non-injected, sclera clear OROPHARYNX:no exudate, no erythema and lips, buccal mucosa, and tongue normal  NECK: supple, thyroid normal size, non-tender, without nodularity LYMPH:  no palpable lymphadenopathy in the cervical, axillary or inguinal LUNGS: Clear HEART: regular rate & rhythm and no murmurs and no lower extremity edema ABDOMEN:abdomen soft, non-tender and normal bowel sounds Musculoskeletal:no cyanosis of digits and no clubbing  NEURO: alert & oriented x 3 with fluent speech, no focal motor/sensory deficits  LABORATORY DATA:  I have reviewed the data as listed CMP Latest Ref Rng & Units 10/14/2017 10/13/2017 10/07/2017   Glucose 70 - 99 mg/dL 106(H) 110(H) 143(H)  BUN 6 - 20 mg/dL 16 15 13   Creatinine 0.61 - 1.24 mg/dL 0.96 1.00 0.89  Sodium 135 - 145 mmol/L 139 139 137  Potassium 3.5 - 5.1 mmol/L 3.7 3.6 3.7  Chloride 98 - 111 mmol/L 105 102 97(L)  CO2 22 - 32 mmol/L 26 25 29   Calcium 8.9 - 10.3 mg/dL 7.8(L) 8.5(L) 9.1  Total Protein 6.5 - 8.1 g/dL - 5.8(L) 5.7(L)  Total Bilirubin 0.3 - 1.2 mg/dL - 1.1 0.9  Alkaline Phos 38 - 126 U/L - 30(L) 34(L)  AST 15 - 41 U/L - 29 24  ALT 0 - 44 U/L - 21 42    Lab Results  Component Value Date   WBC 0.2 (LL) 10/14/2017   HGB 8.1 (L) 10/14/2017   HCT 23.2 (L) 10/14/2017   MCV 93.5 10/14/2017   PLT 38 (L) 10/14/2017   NEUTROABS 0.1 (L) 10/13/2017    ASSESSMENT AND PLAN: 1. Neutropenic fever: S/P cycle 1 Gem-Cis for NHL on 09/30/17 Prolonged pancytopenia Appreciate ID help On Zosyn and Vanco Agree with Granix. Will give 480 Mcg daily from tomorrow until [place] > 1000 Patient has an app[facility] at [facility] this Friday for CAR T cell therapy consultation. They need to make that appointment. Hoping his counts will get better and fevers subside by then.  2. Severe anemia : Transfuse if symptomatic 3. Severe thrombocytopenia: Platelets 38: Observation

## 2017-10-14 NOTE — Progress Notes (Signed)
Pharmacy Antibiotic Note  [name] is a 57 y.o. male with PMH of non-Hodgkin's lymphoma admitted on 10/13/2017 with sepsis/febrile neutropenia. Pharmacy initially consulted for Aztreonam dosing, now switching to Cefepime as patient tolerated on previous admission. MD also continuing Vancomycin and Metronidazole.   Plan: Received Vancomycin 1g IV x 1 in ED on 9/22 at 2256. Continue with Vancomycin 1g IV q12h.   Vancomycin peak/trough levels at steady state, as indicated.  Cefepime 2g IV q8h.  Metronidazole 500mg  IV q8h per MD.  Follow renal function, cultures, clinical course, ID recs.   Height: 5\' 8"  (172.7 cm) Weight: 172 lb 9.6 oz (78.3 kg) IBW/kg (Calculated) : 68.4  Temp (24hrs), Avg:101.1 F (38.4 C), Min:97.8 F (36.6 C), Max:102.9 F (39.4 C)  Recent Labs  Lab 10/10/17 0810 10/11/17 0920 10/11/17 1440 10/13/17 1605 10/13/17 1654 10/13/17 1818 10/14/17 0338  WBC 0.4* 0.4* 0.4* 0.3*  --   --  0.2*  CREATININE  --   --   --  1.00  --   --  0.96  LATICACIDVEN  --   --   --   --  1.99* 1.05  --     Estimated Creatinine Clearance: 83.1 mL/min (by C-G formula based on SCr of 0.96 mg/dL).    Allergies  Allergen Reactions  . Penicillins Other (See Comments)    Childhood allergy reaction unknown  Has patient had a PCN reaction causing immediate rash, facial/tongue/throat swelling, SOB or lightheadedness with hypotension: Unknown Has patient had a PCN reaction causing severe rash involving mucus membranes or skin necrosis: Unknown Has patient had a PCN reaction that required hospitalization: Unknown Has patient had a PCN reaction occurring within the last 10 years: Unknown If all of the above answers are "NO", then may proceed with Cephalosporin use.     Antimicrobials this admission: 9/22 Aztreonam >> 9/23 9/22 Vancomycin >> 9/22 Metronidazole >> 9/23 Cefepime >>  Microbiology results: 9/22 BCx [facility]): NGTD  9/22 UCx: sent 9/22 BCx: sent   Thank you for  allowing pharmacy to be a part of this patient's care.   Lindell Spar, PharmD, BCPS Pager: [phone] 10/14/2017 12:17 PM

## 2017-10-14 NOTE — Evaluation (Signed)
Physical Therapy Evaluation-1x Patient Details Name: [name] MRN: [id number] DOB: [birth year] Today's Date: 10/14/2017   History of Present Illness  57 yo male admitted with sepsis, neutropenia. Hx of NHL, neutropenia, tremors  Clinical Impression  On eval, pt was Supervision-Ind level for mobility. He walked ~250 feet around the unit. Had pt wear mask in hallway 2* neutropenia. O2 sat 88% on RA during ambulation, dyspnea 2/4. One standing rest break needed/taken due to fatigue. No acute PT needs. 1x eval. Sign off.     Follow Up Recommendations No PT follow up    Equipment Recommendations  None recommended by PT    Recommendations for Other Services       Precautions / Restrictions Precautions Precaution Comments: neutropenic (mask worn); monitor O2 sat Restrictions Weight Bearing Restrictions: No      Mobility  Bed Mobility Overal bed mobility: Independent                Transfers Overall transfer level: Independent                  Ambulation/Gait Ambulation/Gait assistance: Supervision Gait Distance (Feet): 250 Feet Assistive device: None Gait Pattern/deviations: Step-through pattern;Decreased stride length     General Gait Details: 1 standing rest break due to dyspnea. O2 sat 88% on RA throughout ambulation distance.   Stairs            Wheelchair Mobility    Modified Rankin (Stroke Patients Only)       Balance Overall balance assessment: No apparent balance deficits (not formally assessed)                                           Pertinent Vitals/Pain Pain Assessment: No/denies pain    Home Living Family/patient expects to be discharged to:: Private residence Living Arrangements: Spouse/significant other Available Help at Discharge: Family Type of Home: House Home Access: Level entry     Home Layout: Two level Home Equipment: None      Prior Function Level of Independence: Independent                Hand Dominance        Extremity/Trunk Assessment   Upper Extremity Assessment Upper Extremity Assessment: Overall WFL for tasks assessed    Lower Extremity Assessment Lower Extremity Assessment: Overall WFL for tasks assessed    Cervical / Trunk Assessment Cervical / Trunk Assessment: Normal  Communication   Communication: No difficulties  Cognition Arousal/Alertness: Awake/alert Behavior During Therapy: WFL for tasks assessed/performed Overall Cognitive Status: Within Functional Limits for tasks assessed                                        General Comments      Exercises     Assessment/Plan    PT Assessment Patent does not need any further PT services  PT Problem List Decreased activity tolerance       PT Treatment Interventions      PT Goals (Current goals can be found in the Care Plan section)  Acute Rehab PT Goals Patient Stated Goal: get better then home PT Goal Formulation: All assessment and education complete, DC therapy    Frequency     Barriers to discharge        Co-evaluation  AM-PAC PT "6 Clicks" Daily Activity  Outcome Measure Difficulty turning over in bed (including adjusting bedclothes, sheets and blankets)?: None Difficulty moving from lying on back to sitting on the side of the bed? : None Difficulty sitting down on and standing up from a chair with arms (e.g., wheelchair, bedside commode, etc,.)?: None Help needed moving to and from a bed to chair (including a wheelchair)?: None Help needed walking in hospital room?: None Help needed climbing 3-5 steps with a railing? : A Little 6 Click Score: 23    End of Session   Activity Tolerance: Patient tolerated treatment well Patient left: in bed;with call bell/phone within reach;with family/visitor present        Time: 8472-0721 PT Time Calculation (min) (ACUTE ONLY): 8 min   Charges:   PT Evaluation $PT Eval Low Complexity: [place], PT Acute Rehabilitation Services Pager: [phone] Office: [phone]

## 2017-10-14 NOTE — Progress Notes (Signed)
Lab Results WBC  Date/Time Value Ref Range Status  10/13/2017 04:05 PM 0.3 (LL) 4.0 - 10.5 K/uL Final    Comment:    RESULT REPEATED AND VERIFIED CRITICAL RESULT CALLED TO, READ BACK BY AND VERIFIED WITH: CLAPP, S AT 1745 ON [id number] BY HOOKER,B   09/26/2017 04:14 AM 5.8 4.0 - 10.5 K/uL Final  09/25/2017 04:14 AM 5.6 4.0 - 10.5 K/uL Final   WBC Count  Date/Time Value Ref Range Status  10/11/2017 02:40 PM 0.4 (LL) 4.0 - 10.3 K/uL Final    Comment:    CRITICAL RESULT CALLED TO, READ BACK BY AND VERIFIED WITH: EMILEE GANTT    10/11/2017 09:20 AM 0.4 (LL) 4.0 - 10.3 K/uL Final    Comment:    CRITICAL RESULT CALLED TO, READ BACK BY AND VERIFIED WITH: Larinda Buttery, RN [phone]    10/10/2017 08:10 AM 0.4 (LL) 4.0 - 10.3 K/uL Final    Comment:    CRITICAL RESULT CALLED TO, READ BACK BY AND VERIFIED WITH: Julieanne Manson, RN [id number]     NEUT%  Date/Time Value Ref Range Status  01/24/2017 08:39 AM 72.5 39.0 - 75.0 % Final  01/03/2017 08:35 AM 68.8 39.0 - 75.0 % Final  12/12/2016 08:30 AM 70.8 39.0 - 75.0 % Final   Neutrophils Relative %  Date/Time Value Ref Range Status  10/13/2017 04:05 PM 37 % Final  10/11/2017 02:40 PM 65 % Final  10/11/2017 09:20 AM 52 % Final   No results found for: PCO2ART Lactic Acid, Venous  Date/Time Value Ref Range Status  10/13/2017 06:18 PM 1.05 0.5 - 1.9 mmol/L Final  10/13/2017 04:54 PM 1.99 (H) 0.5 - 1.9 mmol/L Final  09/20/2017 03:29 AM 1.17 0.5 - 1.9 mmol/L Final   pCO2, Ven  Date/Time Value Ref Range Status  09/20/2017 05:43 AM 40.4 (L) 44.0 - 60.0 mmHg Final

## 2017-10-15 ENCOUNTER — Inpatient Hospital Stay (HOSPITAL_COMMUNITY): Payer: 59

## 2017-10-15 DIAGNOSIS — Z95828 Presence of other vascular implants and grafts: Secondary | ICD-10-CM

## 2017-10-15 DIAGNOSIS — R112 Nausea with vomiting, unspecified: Secondary | ICD-10-CM

## 2017-10-15 LAB — RESPIRATORY PANEL BY PCR
ADENOVIRUS-RVPPCR: NOT DETECTED
Bordetella pertussis: NOT DETECTED
CORONAVIRUS 229E-RVPPCR: NOT DETECTED
CORONAVIRUS HKU1-RVPPCR: NOT DETECTED
CORONAVIRUS NL63-RVPPCR: NOT DETECTED
CORONAVIRUS OC43-RVPPCR: NOT DETECTED
Chlamydophila pneumoniae: NOT DETECTED
INFLUENZA B-RVPPCR: NOT DETECTED
Influenza A: NOT DETECTED
MYCOPLASMA PNEUMONIAE-RVPPCR: NOT DETECTED
Metapneumovirus: NOT DETECTED
PARAINFLUENZA VIRUS 1-RVPPCR: NOT DETECTED
PARAINFLUENZA VIRUS 2-RVPPCR: NOT DETECTED
Parainfluenza Virus 3: NOT DETECTED
Parainfluenza Virus 4: NOT DETECTED
RESPIRATORY SYNCYTIAL VIRUS-RVPPCR: NOT DETECTED
Rhinovirus / Enterovirus: NOT DETECTED

## 2017-10-15 LAB — CBC WITH DIFFERENTIAL/PLATELET
BASOS ABS: 0 10*3/uL (ref 0.0–0.1)
BASOS PCT: 0 %
EOS PCT: 7 %
Eosinophils Absolute: 0 10*3/uL (ref 0.0–0.7)
HCT: 23.7 % — ABNORMAL LOW (ref 39.0–52.0)
Hemoglobin: 8.2 g/dL — ABNORMAL LOW (ref 13.0–17.0)
LYMPHS PCT: 13 %
Lymphs Abs: 0.1 10*3/uL (ref 0.7–4.0)
MCH: 32.2 pg (ref 26.0–34.0)
MCHC: 34.6 g/dL (ref 30.0–36.0)
MCV: 92.9 fL (ref 78.0–100.0)
Monocytes Absolute: 0.1 10*3/uL (ref 0.1–1.0)
Monocytes Relative: 24 %
NEUTROS ABS: 0.3 10*3/uL (ref 1.7–7.7)
Neutrophils Relative %: 56 %
PLATELETS: 45 10*3/uL — AB (ref 150–400)
RBC: 2.55 MIL/uL — ABNORMAL LOW (ref 4.22–5.81)
RDW: 14.2 % (ref 11.5–15.5)
WBC: 0.5 10*3/uL — AB (ref 4.0–10.5)

## 2017-10-15 LAB — URINE CULTURE: Culture: NO GROWTH

## 2017-10-15 LAB — BASIC METABOLIC PANEL
Anion gap: 6 (ref 5–15)
BUN: 13 mg/dL (ref 6–20)
CO2: 25 mmol/L (ref 22–32)
Calcium: 7.7 mg/dL — ABNORMAL LOW (ref 8.9–10.3)
Chloride: 112 mmol/L — ABNORMAL HIGH (ref 98–111)
Creatinine, Ser: 0.83 mg/dL (ref 0.61–1.24)
Glucose, Bld: 123 mg/dL — ABNORMAL HIGH (ref 70–99)
POTASSIUM: 4.1 mmol/L (ref 3.5–5.1)
SODIUM: 143 mmol/L (ref 135–145)

## 2017-10-15 LAB — MAGNESIUM: MAGNESIUM: 1.8 mg/dL (ref 1.7–2.4)

## 2017-10-15 LAB — BRAIN NATRIURETIC PEPTIDE: B Natriuretic Peptide: 83.4 pg/mL (ref 0.0–100.0)

## 2017-10-15 MED ORDER — BUDESONIDE 0.5 MG/2ML IN SUSP
0.5000 mg | Freq: Two times a day (BID) | RESPIRATORY_TRACT | Status: DC
  Administered 2017-10-15 – 2017-10-16 (×3): 0.5 mg via RESPIRATORY_TRACT
  Filled 2017-10-15 (×4): qty 2

## 2017-10-15 MED ORDER — ALBUTEROL SULFATE (2.5 MG/3ML) 0.083% IN NEBU: 2.5000 mg | INHALATION_SOLUTION | RESPIRATORY_TRACT | Status: DC | PRN

## 2017-10-15 MED ORDER — SODIUM CHLORIDE 0.9 % IV BOLUS
500.0000 mL | Freq: Once | INTRAVENOUS | Status: AC
  Administered 2017-10-15: 500 mL via INTRAVENOUS

## 2017-10-15 MED ORDER — HYDROCOD POLST-CPM POLST ER 10-8 MG/5ML PO SUER
5.0000 mL | Freq: Two times a day (BID) | ORAL | Status: DC | PRN
  Administered 2017-10-15 (×2): 5 mL via ORAL
  Filled 2017-10-15 (×2): qty 5

## 2017-10-15 MED ORDER — IPRATROPIUM-ALBUTEROL 0.5-2.5 (3) MG/3ML IN SOLN
3.0000 mL | Freq: Three times a day (TID) | RESPIRATORY_TRACT | Status: DC
  Administered 2017-10-16 – 2017-10-18 (×6): 3 mL via RESPIRATORY_TRACT
  Filled 2017-10-15 (×7): qty 3

## 2017-10-15 MED ORDER — IPRATROPIUM-ALBUTEROL 0.5-2.5 (3) MG/3ML IN SOLN
3.0000 mL | Freq: Four times a day (QID) | RESPIRATORY_TRACT | Status: DC
  Administered 2017-10-15: 3 mL via RESPIRATORY_TRACT
  Filled 2017-10-15: qty 3

## 2017-10-15 MED ORDER — BUDESONIDE 0.5 MG/2ML IN SUSP
0.5000 mg | Freq: Two times a day (BID) | RESPIRATORY_TRACT | Status: DC
  Administered 2017-10-15: 0.5 mg via RESPIRATORY_TRACT
  Filled 2017-10-15: qty 2

## 2017-10-15 MED ORDER — SODIUM CHLORIDE 0.9% FLUSH
10.0000 mL | INTRAVENOUS | Status: DC | PRN
  Administered 2017-10-15: 10 mL
  Administered 2017-10-20: 20 mL
  Filled 2017-10-15: qty 40

## 2017-10-15 MED ORDER — ACETAMINOPHEN 325 MG PO TABS
325.0000 mg | ORAL_TABLET | Freq: Once | ORAL | Status: AC
  Administered 2017-10-15: 325 mg via ORAL
  Filled 2017-10-15: qty 1

## 2017-10-15 MED ORDER — IBUPROFEN 200 MG PO TABS
600.0000 mg | ORAL_TABLET | Freq: Three times a day (TID) | ORAL | Status: DC | PRN
  Administered 2017-10-15 – 2017-10-18 (×5): 600 mg via ORAL
  Filled 2017-10-15 (×5): qty 3

## 2017-10-15 MED ORDER — IBUPROFEN 200 MG PO TABS
400.0000 mg | ORAL_TABLET | Freq: Once | ORAL | Status: AC
  Administered 2017-10-15: 400 mg via ORAL

## 2017-10-15 MED ORDER — HYDROCOD POLST-CPM POLST ER 10-8 MG/5ML PO SUER: 5.0000 mL | Freq: Once | ORAL | Status: DC

## 2017-10-15 NOTE — Progress Notes (Signed)
Pt ambulated to bathroom without O2 and sat dropped to 77%, per pt. RN checked O2 on 2 L and was 94%. Pt temp was 99.5. RN gave Tylenol. Temp was 100.3 30 min later. RN gave Ibuprofen. MD was notified. Pulmicort neb and Duoneb was given. Will continue to monitor.

## 2017-10-15 NOTE — Progress Notes (Signed)
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  Apparently for the past week or so patient has been having off-and-on fevers at home therefore was started on Levaquin about a week ago but despite of this continued to have fever.  His last chemo cycle was about 2 weeks ago- gemcitabine and cisplatin.  Was also noted to be neutropenic therefore admitted to the hospital for further care.  Pancultures were performed and he was started on vancomycin, aztreonam and Flagyl.  Oncology and infectious disease were consulted.  He was recommended to continue same antibiotics except discontinuing Flagyl.  Granix was started.   Assessment & Plan:   Principal Problem:   Febrile neutropenia (HCC) Active Problems:   Pancytopenia ([place])   History of non-Hodgkin's lymphoma   T-cell lymphoma ([place])   Port catheter in place   Sepsis ([place])  Sepsis secondary to unclear exact source Febrile neutropenia, moderate to severe - Cultures have been performed thus far they are negative - Infectious disease consulted who recommended continuing vancomycin and Flagyl -Granix has been started 9/23.  Oncology is following -cont flutter valve and incentive spirometry  Acute moderate respiratory distress -Secondary to likely underlying pneumonia and malignancy. - We will change breathing treatment to schedule from as needed.  Add Pulmicort. -Encouraged to use incentive spirometry and flutter valve. -Check respiratory panel  History of non-Hodgkin's lymphoma and T-cell lymphoma with metastases - Fever can also board to be related to his underlying malignancy but will still need to rule out an infectious cause given the  severity of his neutropenia. -Oncology following.  History of superficial vein thrombosis -On Xarelto.  While he is thrombocytopenic, will hold off as he was advised this outpatient by Dr. Learta Codding, other defer to onc  Anxiety - On Ativan  Essential hypertension -Continue atenolol  DVT prophylaxis: Xarelto with caution due to neutropenia Code Status: Full code Family Communication: Wife is at bedside Disposition Plan:  Maintain inpatient stay for IV antibiotic treatment  Consultants:   Infectious disease  Oncology  Procedures:   None  Antimicrobials:   Vancomycin 9/22 >  Zosyn 9/22>   Flagyl stopped  9/22>9/23   Subjective: Was feeling better in the morning and had fever overnight.  But this afternoon as he was ambulating to the bathroom he became short of breath and started wheezing therefore breathing treatments had to be given.  Review of Systems Otherwise negative except as per HPI, including: General: Denies fever, chills, night sweats or unintended weight loss. Resp: Denies hemoptysis Cardiac: Denies chest pain, palpitations, orthopnea, paroxysmal nocturnal dyspnea. GI: Denies abdominal pain, nausea, vomiting, diarrhea or constipation GU: Denies dysuria, frequency, hesitancy or incontinence MS: Denies muscle aches, joint pain or swelling Neuro: Denies headache, neurologic deficits (focal weakness, numbness, tingling), abnormal gait Psych: Denies anxiety, depression, SI/HI/AVH Skin: Denies new rashes or lesions ID: Denies sick contacts, exotic exposures, travel  Objective: Vitals:   10/14/17 2349 10/15/17 0102 10/15/17 0250 10/15/17 0506  BP: 115/73   96/61  Pulse: (!) 118   78  Resp: (!) 30  20 (!) 24  Temp: (!) 102.6 F (39.2 C) (!) 102.5 F (39.2 C) 98.4 F (36.9 C) 97.6 F (36.4 C)  TempSrc: Oral Oral Oral  Oral  SpO2: 95%   96%  Weight:      Height:        Intake/Output Summary (Last 24 hours) at 10/15/2017 1331 Last data filed at  10/15/2017 0600 Gross per 24 hour  Intake 3060.45 ml  Output 1 ml  Net 3059.45 ml   Filed Weights   10/13/17 1907 10/13/17 2121  Weight: 79.4 kg 78.3 kg    Examination:  General exam: Appears calm and comfortable  Respiratory system: Slightly diffuse diminished breath sounds Cardiovascular system: S1 & S2 heard, RRR. No JVD, murmurs, rubs, gallops or clicks. No pedal edema. Gastrointestinal system: Abdomen is nondistended, soft and nontender. No organomegaly or masses felt. Normal bowel sounds heard. Central nervous system: Alert and oriented. No focal neurological deficits. Extremities: Symmetric 5 x 5 power. Skin: No rashes, lesions or ulcers Psychiatry: Judgement and insight appear normal. Mood & affect appropriate.  Right chest wall Port-A-Cath in place without any evidence of infection.   Data Reviewed:   CBC: Recent Labs  Lab 10/10/17 0810 10/11/17 0920 10/11/17 1440 10/11/17 1558 10/13/17 1605 10/14/17 0338 10/15/17 0420  WBC 0.4* 0.4* 0.4*  --  0.3* 0.2* 0.5*  NEUTROABS 0.3* 0.2* 0.2*  --  0.1*  --  0.3  HGB 10.0* 9.3* 9.2*  --  8.7* 8.1* 8.2*  HCT 28.4* 27.1* 26.9*  --  25.4* 23.2* 23.7*  MCV 92.9 92.8 93.7  --  92.0 93.5 92.9  PLT 24* 18* 18* 39* 40* 38* 45*   Basic Metabolic Panel: Recent Labs  Lab 10/13/17 1605 10/13/17 2200 10/14/17 0338 10/15/17 0420  NA 139  --  139 143  K 3.6  --  3.7 4.1  CL 102  --  105 112*  CO2 25  --  26 25  GLUCOSE 110*  --  106* 123*  BUN 15  --  16 13  CREATININE 1.00  --  0.96 0.83  CALCIUM 8.5*  --  7.8* 7.7*  MG  --  1.8  --  1.8  PHOS  --  2.9  --   --    GFR: Estimated Creatinine Clearance: 95 mL/min (by C-G formula based on SCr of 0.83 mg/dL). Liver Function Tests: Recent Labs  Lab 10/13/17 1605  AST 29  ALT 21  ALKPHOS 30*  BILITOT 1.1  PROT 5.8*  ALBUMIN 2.8*   No results for input(s): LIPASE, AMYLASE in the last 168 hours. No results for input(s): AMMONIA in the last 168 hours. Coagulation  Profile: Recent Labs  Lab 10/13/17 1605  INR 1.15   Cardiac Enzymes: No results for input(s): CKTOTAL, CKMB, CKMBINDEX, TROPONINI in the last 168 hours. BNP (last 3 results) No results for input(s): PROBNP in the last 8760 hours. HbA1C: No results for input(s): HGBA1C in the last 72 hours. CBG: No results for input(s): GLUCAP in the last 168 hours. Lipid Profile: No results for input(s): CHOL, HDL, LDLCALC, TRIG, CHOLHDL, LDLDIRECT in the last 72 hours. Thyroid Function Tests: No results for input(s): TSH, T4TOTAL, FREET4, T3FREE, THYROIDAB in the last 72 hours. Anemia Panel: No results for input(s): VITAMINB12, FOLATE, FERRITIN, TIBC, IRON, RETICCTPCT in the last 72 hours. Sepsis Labs: Recent Labs  Lab 10/13/17 1654 10/13/17 1818  LATICACIDVEN 1.99* 1.05    Recent Results (from the past 240 hour(s))  Culture, blood (Routine x 2)     Status: None (Preliminary result)   Collection Time: 10/13/17  4:05 PM  Result Value Ref Range Status   Specimen Description  Final    PORTA CATH Performed at [facility], [place] [address] [place], [place] [address]    Special Requests   Final    BOTTLES DRAWN AEROBIC AND ANAEROBIC Blood Culture adequate volume Performed at [facility] [address], [place], [place] [address]    Culture   Final    NO GROWTH 2 DAYS Performed at [facility] [address] [place], [place] [address]    Report Status PENDING  Incomplete  Urine Culture     Status: None   Collection Time: 10/13/17  6:29 PM  Result Value Ref Range Status   Specimen Description   Final    URINE, RANDOM Performed at [facility] [address], [place], [place] [address]    Special Requests   Final    NONE Performed at [facility], [place] [address] [place], [place] [address]    Culture   Final    NO GROWTH Performed at [facility], [place] [address], [place], [place] [address]     Report Status 10/15/2017 FINAL  Final  Culture, blood (Routine x 2)     Status: None (Preliminary result)   Collection Time: 10/14/17  4:57 AM  Result Value Ref Range Status   Specimen Description   Final    BLOOD RIGHT HAND Performed at [facility] [address], [place], [place] [address]    Special Requests   Final    BOTTLES DRAWN AEROBIC ONLY Blood Culture results may not be optimal due to an inadequate volume of blood received in culture bottles Performed at [facility] [address], [place], [place] [address]    Culture   Final    NO GROWTH 1 DAY Performed at [facility], [place] [address], [place], [place] [address]    Report Status PENDING  Incomplete         Radiology Studies: Dg Chest 2 View  Result Date: 10/13/2017 CLINICAL DATA:  Fever, lymphoma, shortness of breath EXAM: CHEST - 2 VIEW COMPARISON:  09/24/2017 FINDINGS: Right Port-A-Cath remains in place, unchanged. Innumerable bilateral pulmonary nodules are again noted, stable. Small left effusion, stable. Heart is normal size. No acute bony abnormality. IMPRESSION: Stable innumerable bilateral pulmonary nodules. Small left effusion. No change since prior study. Electronically Signed   By: Rolm Baptise M.D.   On: 10/13/2017 16:56        Scheduled Meds: . atenolol  50 mg Oral Daily  . budesonide (PULMICORT) nebulizer solution  0.5 mg Nebulization BID  . chlorpheniramine-HYDROcodone  5 mL Oral Once  . ipratropium-albuterol  3 mL Nebulization Q6H  . loratadine  10 mg Oral Daily  . LORazepam  0.5 mg Oral Q8H  . Tbo-filgastrim (GRANIX) SQ  480 mcg Subcutaneous q1800   Continuous Infusions: . sodium chloride 75 mL/hr at 10/15/17 0600  . ceFEPime (MAXIPIME) IV 2 g (10/15/17 1330)  . vancomycin 1,000 mg (10/15/17 1052)     LOS: 2 days   Time spent= 45 mins    Ankit Arsenio Loader, MD [facility] Pager [phone]   If 7PM-7AM, please contact  night-coverage [email or web address] Password [email or web address] 10/15/2017, 1:31 PM

## 2017-10-15 NOTE — Progress Notes (Signed)
Patient ID: NOLON YELLIN, male   DOB: 05-10-1960, 57 y.o.   MRN: [id number]         [facility]  Date of Admission:  10/13/2017           Day 2 vancomycin        Day 2 cefepime ASSESSMENT: Persistent febrile neutropenia following recent chemotherapy for lymphoma.  Source remains unclear.  It is difficult to know what to make of his slightly worsened and dry cough given known pulmonary metastases.  Blood and urine cultures remain negative.  His total white blood cell count is up to 500 and his absolute neutrophil count is now up to 300.  PLAN: 1. Continue current antibiotics  Principal Problem:   Febrile neutropenia (HCC) Active Problems:   T-cell lymphoma (HCC)   Sepsis ([place])   Pancytopenia (HCC)   History of non-Hodgkin's lymphoma   Port catheter in place   Scheduled Meds: . atenolol  50 mg Oral Daily  . chlorpheniramine-HYDROcodone  5 mL Oral Once  . loratadine  10 mg Oral Daily  . LORazepam  0.5 mg Oral Q8H  . Tbo-filgastrim (GRANIX) SQ  480 mcg Subcutaneous q1800   Continuous Infusions: . sodium chloride 75 mL/hr at 10/15/17 0600  . ceFEPime (MAXIPIME) IV Stopped (10/15/17 0531)  . vancomycin Stopped (10/14/17 2256)   PRN Meds:.acetaminophen **OR** acetaminophen, benzonatate, bisacodyl, guaiFENesin-dextromethorphan, ibuprofen, ipratropium-albuterol, lidocaine-prilocaine, magnesium citrate, ondansetron **OR** ondansetron (ZOFRAN) IV, oxyCODONE, prochlorperazine, senna-docusate, sodium chloride flush, zolpidem   SUBJECTIVE: He says that he is feeling about the same.  He had fever, chills and sweats again last night.  He continues to note that his chronic dry cough is a little worse.  He did have one episode of nausea and vomiting after forceful coughing last night.  Review of Systems: Review of Systems  Constitutional: Positive for chills, diaphoresis, fever, malaise/fatigue and weight loss.  HENT: Negative for congestion and sore throat.     Respiratory: Positive for cough and shortness of breath. Negative for hemoptysis and sputum production.   Cardiovascular: Negative for chest pain.  Gastrointestinal: Positive for nausea and vomiting. Negative for abdominal pain and diarrhea.       He denies perirectal pain.  Genitourinary: Negative for dysuria, frequency and urgency.  Musculoskeletal: Negative for back pain, joint pain, myalgias and neck pain.  Skin: Negative for rash.  Neurological: Negative for headaches.    Allergies  Allergen Reactions  . Penicillins Other (See Comments)    Childhood allergy reaction unknown  Has patient had a PCN reaction causing immediate rash, facial/tongue/throat swelling, SOB or lightheadedness with hypotension: Unknown Has patient had a PCN reaction causing severe rash involving mucus membranes or skin necrosis: Unknown Has patient had a PCN reaction that required hospitalization: Unknown Has patient had a PCN reaction occurring within the last 10 years: Unknown If all of the above answers are "NO", then may proceed with Cephalosporin use.     OBJECTIVE: Vitals:   10/14/17 2349 10/15/17 0102 10/15/17 0250 10/15/17 0506  BP: 115/73   96/61  Pulse: (!) 118   78  Resp: (!) 30  20 (!) 24  Temp: (!) 102.6 F (39.2 C) (!) 102.5 F (39.2 C) 98.4 F (36.9 C) 97.6 F (36.4 C)  TempSrc: Oral Oral Oral Oral  SpO2: 95%   96%  Weight:      Height:       Body mass index is 26.24 kg/m.  Physical Exam  Constitutional: He is oriented  to person, place, and time.  He is alert and appears relatively comfortable sitting up in bed.  HENT:  Mouth/Throat: No oropharyngeal exudate.  Eyes: Conjunctivae are normal.  Neck: Neck supple.  Cardiovascular: Normal rate, regular rhythm and normal heart sounds.  No murmur heard. Pulmonary/Chest: Effort normal. He has no wheezes. He has rales.  Port-A-Cath site appears normal.  Abdominal: Soft. He exhibits no distension. There is no tenderness.   Musculoskeletal: Normal range of motion. He exhibits no edema or tenderness.  Neurological: He is alert and oriented to person, place, and time.  Skin: No rash noted.  Psychiatric: He has a normal mood and affect.    Lab Results Lab Results  Component Value Date   WBC 0.5 (LL) 10/15/2017   HGB 8.2 (L) 10/15/2017   HCT 23.7 (L) 10/15/2017   MCV 92.9 10/15/2017   PLT 45 (L) 10/15/2017    Lab Results  Component Value Date   CREATININE 0.83 10/15/2017   BUN 13 10/15/2017   NA 143 10/15/2017   K 4.1 10/15/2017   CL 112 (H) 10/15/2017   CO2 25 10/15/2017    Lab Results  Component Value Date   ALT 21 10/13/2017   AST 29 10/13/2017   ALKPHOS 30 (L) 10/13/2017   BILITOT 1.1 10/13/2017     Microbiology: Recent Results (from the past 240 hour(s))  Culture, blood (Routine x 2)     Status: None (Preliminary result)   Collection Time: 10/13/17  4:05 PM  Result Value Ref Range Status   Specimen Description   Final    PORTA CATH Performed at [facility], [place] [address] [place], [place] [address]    Special Requests   Final    BOTTLES DRAWN AEROBIC AND ANAEROBIC Blood Culture adequate volume Performed at [facility] [address] [place], [place] [address]    Culture   Final    NO GROWTH 2 DAYS Performed at [facility] [address], [place], [place] [address]    Report Status PENDING  Incomplete  Urine Culture     Status: None   Collection Time: 10/13/17  6:29 PM  Result Value Ref Range Status   Specimen Description   Final    URINE, RANDOM Performed at [facility] [address] [place], [place] [address]    Special Requests   Final    NONE Performed at [facility], [place] [address], [place], [place] [address]    Culture   Final    NO GROWTH Performed at [facility], [place] [address] [place], [place] [address]    Report Status 10/15/2017 FINAL  Final  Culture, blood  (Routine x 2)     Status: None (Preliminary result)   Collection Time: 10/14/17  4:57 AM  Result Value Ref Range Status   Specimen Description   Final    BLOOD RIGHT HAND Performed at [facility] [address] [place], [place] [address]    Special Requests   Final    BOTTLES DRAWN AEROBIC ONLY Blood Culture results may not be optimal due to an inadequate volume of blood received in culture bottles Performed at [facility] [address] [place], [place] [address]    Culture   Final    NO GROWTH 1 DAY Performed at [facility], [place] [address], [place], [place] [address]    Report Status PENDING  Incomplete  Michel Bickers, MD [facility] [phone] pager   [phone] cell 10/15/2017, 10:12 AM

## 2017-10-16 ENCOUNTER — Inpatient Hospital Stay (HOSPITAL_COMMUNITY): Payer: 59

## 2017-10-16 DIAGNOSIS — J9621 Acute and chronic respiratory failure with hypoxia: Secondary | ICD-10-CM

## 2017-10-16 DIAGNOSIS — R0902 Hypoxemia: Secondary | ICD-10-CM

## 2017-10-16 DIAGNOSIS — C8330 Diffuse large B-cell lymphoma, unspecified site: Secondary | ICD-10-CM

## 2017-10-16 DIAGNOSIS — J9600 Acute respiratory failure, unspecified whether with hypoxia or hypercapnia: Secondary | ICD-10-CM

## 2017-10-16 DIAGNOSIS — D61818 Other pancytopenia: Secondary | ICD-10-CM

## 2017-10-16 LAB — CBC WITH DIFFERENTIAL/PLATELET
BASOS PCT: 0 %
Basophils Absolute: 0 10*3/uL (ref 0.0–0.1)
EOS ABS: 0 10*3/uL (ref 0.0–0.7)
EOS PCT: 4 %
HCT: 23.7 % — ABNORMAL LOW (ref 39.0–52.0)
Hemoglobin: 8.2 g/dL — ABNORMAL LOW (ref 13.0–17.0)
Lymphocytes Relative: 14 %
Lymphs Abs: 0.1 10*3/uL (ref 0.7–4.0)
MCH: 32.3 pg (ref 26.0–34.0)
MCHC: 34.6 g/dL (ref 30.0–36.0)
MCV: 93.3 fL (ref 78.0–100.0)
Monocytes Absolute: 0.1 10*3/uL (ref 0.1–1.0)
Monocytes Relative: 19 %
Neutro Abs: 0.3 10*3/uL (ref 1.7–7.7)
Neutrophils Relative %: 63 %
PLATELETS: 51 10*3/uL — AB (ref 150–400)
RBC: 2.54 MIL/uL — AB (ref 4.22–5.81)
RDW: 14.4 % (ref 11.5–15.5)
WBC: 0.5 10*3/uL — AB (ref 4.0–10.5)

## 2017-10-16 LAB — BLOOD GAS, ARTERIAL
ACID-BASE DEFICIT: 1.5 mmol/L (ref 0.0–2.0)
Bicarbonate: 22 mmol/L (ref 20.0–28.0)
DRAWN BY: 295031
O2 Content: 5 L/min
O2 SAT: 62.9 %
PATIENT TEMPERATURE: 98.6
pCO2 arterial: 34.8 mmHg (ref 32.0–48.0)
pH, Arterial: 7.418 (ref 7.350–7.450)
pO2, Arterial: 36.3 mmHg — CL (ref 83.0–108.0)

## 2017-10-16 LAB — BASIC METABOLIC PANEL
Anion gap: 7 (ref 5–15)
BUN: 12 mg/dL (ref 6–20)
CALCIUM: 8 mg/dL — AB (ref 8.9–10.3)
CO2: 24 mmol/L (ref 22–32)
CREATININE: 0.83 mg/dL (ref 0.61–1.24)
Chloride: 111 mmol/L (ref 98–111)
Glucose, Bld: 137 mg/dL — ABNORMAL HIGH (ref 70–99)
Potassium: 3.8 mmol/L (ref 3.5–5.1)
SODIUM: 142 mmol/L (ref 135–145)

## 2017-10-16 LAB — MRSA PCR SCREENING: MRSA BY PCR: NEGATIVE

## 2017-10-16 LAB — MAGNESIUM: MAGNESIUM: 1.8 mg/dL (ref 1.7–2.4)

## 2017-10-16 MED ORDER — IOPAMIDOL (ISOVUE-370) INJECTION 76%
INTRAVENOUS | Status: AC
  Filled 2017-10-16: qty 100

## 2017-10-16 MED ORDER — HYDROCOD POLST-CPM POLST ER 10-8 MG/5ML PO SUER
5.0000 mL | Freq: Two times a day (BID) | ORAL | Status: DC | PRN
  Administered 2017-10-16 – 2017-10-30 (×11): 5 mL via ORAL
  Filled 2017-10-16 (×12): qty 5

## 2017-10-16 MED ORDER — FUROSEMIDE 10 MG/ML IJ SOLN
40.0000 mg | Freq: Once | INTRAMUSCULAR | Status: AC
  Administered 2017-10-16: 40 mg via INTRAVENOUS
  Filled 2017-10-16: qty 4

## 2017-10-16 MED ORDER — BENZONATATE 100 MG PO CAPS
100.0000 mg | ORAL_CAPSULE | Freq: Three times a day (TID) | ORAL | Status: DC | PRN
  Administered 2017-10-16 – 2017-10-23 (×9): 100 mg via ORAL
  Filled 2017-10-16 (×11): qty 1

## 2017-10-16 MED ORDER — IOPAMIDOL (ISOVUE-370) INJECTION 76%
100.0000 mL | Freq: Once | INTRAVENOUS | Status: AC | PRN
  Administered 2017-10-16: 63 mL via INTRAVENOUS

## 2017-10-16 MED ORDER — METHYLPREDNISOLONE SODIUM SUCC 125 MG IJ SOLR
60.0000 mg | INTRAMUSCULAR | Status: DC
  Administered 2017-10-17 – 2017-10-19 (×3): 60 mg via INTRAVENOUS
  Filled 2017-10-16 (×3): qty 2

## 2017-10-16 MED ORDER — FUROSEMIDE 10 MG/ML IJ SOLN
40.0000 mg | Freq: Two times a day (BID) | INTRAMUSCULAR | Status: DC
  Administered 2017-10-16 – 2017-10-17 (×3): 40 mg via INTRAVENOUS
  Filled 2017-10-16 (×3): qty 4

## 2017-10-16 MED ORDER — HEPARIN (PORCINE) IN NACL 100-0.45 UNIT/ML-% IJ SOLN: 1200.0000 [IU]/h | INTRAMUSCULAR | Status: DC

## 2017-10-16 MED ORDER — HEPARIN BOLUS VIA INFUSION
2000.0000 [IU] | Freq: Once | INTRAVENOUS | Status: DC
  Filled 2017-10-16: qty 2000

## 2017-10-16 MED ORDER — GUAIFENESIN-DM 100-10 MG/5ML PO SYRP
5.0000 mL | ORAL_SOLUTION | ORAL | Status: DC | PRN
  Administered 2017-10-16 – 2017-10-23 (×5): 5 mL via ORAL
  Filled 2017-10-16 (×8): qty 10

## 2017-10-16 MED ORDER — METHYLPREDNISOLONE SODIUM SUCC 125 MG IJ SOLR
125.0000 mg | Freq: Once | INTRAMUSCULAR | Status: AC
  Administered 2017-10-16: 125 mg via INTRAVENOUS
  Filled 2017-10-16: qty 2

## 2017-10-16 NOTE — Progress Notes (Signed)
Patient w/ tachypnea at rest and temp of 100 earlier in shift, on-call NP made aware. Order for fluid bolus carried out, and pt sustaining at resp rate of 30 and SOB. Pt afebrile. 02 sat 93% on 3L 02, pt declined PRN breathing treatment. Respiratory therapist to bedside, lungs wet and crackles bilaterally upon auscultation. On-call NP made aware and new order of IV lasix 40 mg administered. Will continue to monitor.

## 2017-10-16 NOTE — Progress Notes (Addendum)
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  Admitted on 9/22 with neutropenic fever, started on broad-spectrum antibiotics. Critical care consulted 9/25 for acute on chronic hypoxic respiratory failure  Significant Hospital Events    9/23: Infectious disease consulted.  Had developed mild worsening and chronic cough and shortness of breath recommended to continue vancomycin and cefepime possible pneumonia.  Was given Granix per hematology with plans to continue this until [place] was over 1000.  Chest x-ray with diffuse bilateral pulmonary infiltrates chronic in nature since late Aug 2019, 9/24: Having intermittent fever, cough a little bit worse.  One episode of nausea vomiting after forceful cough.  Room air oxygenation at 77% 9/25: Increased tachypnea.  Fluid challenge administered earlier.  Given IV Lasix.  Chest x-ray obtained showed progressive left lower lobe airspace disease, and persistent bilateral patchy airspace disease, oxygen requirements up to 7 L, CT chest negative for pulmonary emboli.  Moved to stepdown unit, trickle care asked to see for acute on chronic respiratory failure.   Consults: date of consult/date signed off & final recs:  Pulmonary consulted 9/25  Procedures (surgical and bedside):    Significant Diagnostic Tests:  Echocardiogram from 8/31: Ejection fraction 55 to 60% wall Motion was normal diastolic function normal wall CT angiogram 9/25: Negative for pulmonary edema.  Increased mediastinal adenopathy, hilar adenopathy, and central groundglass opacities.  Also has left pleural effusion.  This looks small to moderate in size.  Pulmonary masses and  right axillary adenopathy are stable.  Increased consolidation left lower lobe.  Micro Data: Respiratory virus panel 9/24: Negative Blood cultures obtained 9/23:>>> Urine culture 9/22: Negative  Antimicrobials:  Aztreonam 9/22 through 9/23 Cefepime 9/23>>> Metronidazole 9/22 through 9/23 Vancomycin 9/22>>>  Subjective:  Feels better   Objective   Blood pressure (Abnormal) 170/97, pulse (Abnormal) 138, temperature 98.7 F (37.1 C), temperature source Oral, resp. rate (Abnormal) 48, height 5\' 8"  (1.727 m), weight 78.3 kg, SpO2 90 %.        Intake/Output Summary (Last 24 hours) at 10/16/2017 0931 Last data filed at 10/16/2017 0914 Gross per 24 hour  Intake 1784.77 ml  Output 600 ml  Net 1184.77 ml   Filed Weights   10/13/17 1907 10/13/17 2121  Weight: 79.4 kg 78.3 kg    Examination: General: Well-nourished 57 year old white male currently resting in bed HENT: Normal cephalic atraumatic no jugular venous distention Lungs: Accessory use, crackles both bases Cardiovascular: Tachycardic regular rate and rhythm Abdomen: Soft nontender Extremities: No edema brisk cap refill Neuro: Awake oriented no focal deficit GU: Voids spontaneously  Resolved Hospital Problem list     Assessment & Plan:   Acute on chronic hypoxic respiratory failure in the setting of probable pneumonia, superimposed on underlying pulmonary metastasis and possibly complicated by element of volume overload (which is almost certainly the reason for transfer) Portable chest x-ray personally reviewed: Demonstrates persistent diffuse bilateral infiltrates, these have been present since late August 2019.  His chest x-ray looked fairly stable on admission.  He now demonstrates worsening aeration involving particularly the left base.  CT imaging shows Left basilar consolidation as well as element of left pleural effusion Plan Continuing supplemental oxygen with pulse oximetry  Need for systemic anticoagulation, can  continue prophylactic the only Day 3 cefepime and vancomycin per infectious disease Continue IV Lasix Bronchodilators as needed Follow-up chest x-ray Could consider bronchoscopy but does not appear toxic and think oxygen demands too high for this and pt would prefer to avoid    Pancytopenia.  Last chemotherapy approximately 2 weeks ago.  Granix started on 9/23 -Hemoglobin stable, platelet counts improving, white blood cell count slowly improving with as absolute neutrophils slowly improving as well Plan Continuing trend CBC Continue Granix until absolute neutrophils greater than 500  Febrile neutropenia Plan Follow cultures Antibiotics per infectious disease Continue neutropenic precautions  T-cell lymphoma with metastasis to lung Plan Per hematology/oncology Will contact Onc. Had consult scheduled for [facility] for CAR T cell therapy eval for Friday. Wonder should we just transfer him there?    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  Received several fluid boluses over the course of the evening resulting in acute respiratory distress.  Remarkable improvement following 80 mg of IV Lasix.  Suspect this was iatrogenic from volume loading.  Clinically better.  I wonder if he would be best served by transfer to [facility] for further evaluation and treatment of his lymphoma.  We will reach out to oncology    Diet: Heart healthy Pain/Anxiety/Delirium protocol (if indicated): Not indicated VAP protocol (if indicated): Not indicated DVT prophylaxis: Has been on hold due to thrombocytopenia was on Xarelto previously for superficial vein thrombosis, platelets have been stable/improving will start low molecular weight heparin at prophylaxis dosing 9/25 GI prophylaxis: Not  indicated Hyperglycemia protocol: Not indicated Mobility: Bedrest Code Status: Full code Family Communication: Family updated  Labs   CBC: Recent Labs  Lab 10/11/17 0920 10/11/17 1440 10/11/17 1558 10/13/17 1605 10/14/17 0338 10/15/17 0420 10/16/17 0349  WBC 0.4* 0.4*  --  0.3* 0.2* 0.5* 0.5*  NEUTROABS 0.2* 0.2*  --  0.1*  --  0.3 0.3  HGB 9.3* 9.2*  --  8.7* 8.1* 8.2* 8.2*  HCT 27.1* 26.9*  --  25.4* 23.2* 23.7* 23.7*  MCV 92.8 93.7  --  92.0 93.5 92.9 93.3  PLT 18* 18* 39* 40* 38* 45* 51*    Basic Metabolic Panel: Recent Labs  Lab 10/13/17 1605 10/13/17 2200 10/14/17 0338 10/15/17 0420 10/16/17 0349  NA 139  --  139 143 142  K 3.6  --  3.7 4.1 3.8  CL 102  --  105 112* 111  CO2 25  --  26 25 24   GLUCOSE 110*  --  106* 123* 137*  BUN 15  --  16 13 12   CREATININE 1.00  --  0.96 0.83 0.83  CALCIUM 8.5*  --  7.8* 7.7* 8.0*  MG  --  1.8  --  1.8 1.8  PHOS  --  2.9  --   --   --    GFR: Estimated Creatinine Clearance: 95 mL/min (by C-G formula based on SCr of 0.83 mg/dL). Recent Labs  Lab 10/13/17 1605 10/13/17 1654 10/13/17 1818 10/14/17 0338 10/15/17 0420 10/16/17 0349  WBC 0.3*  --   --  0.2* 0.5* 0.5*  LATICACIDVEN  --  1.99* 1.05  --   --   --     Liver Function Tests: Recent Labs  Lab 10/13/17 1605  AST 29  ALT 21  ALKPHOS 30*  BILITOT 1.1  PROT 5.8*  ALBUMIN 2.8*   No results for input(s): LIPASE, AMYLASE in the last 168 hours. No results for input(s): AMMONIA in the last 168 hours.  ABG    Component Value Date/Time   PHART 7.418 10/16/2017 0802   PCO2ART 34.8 10/16/2017 0802   PO2ART 36.3 (LL) 10/16/2017 0802   HCO3 22.0 10/16/2017 0802   ACIDBASEDEF 1.5 10/16/2017 0802   O2SAT 62.9 10/16/2017 0802     Coagulation Profile: Recent Labs  Lab 10/13/17 1605  INR 1.15    Cardiac Enzymes: No results for input(s): CKTOTAL, CKMB, CKMBINDEX, TROPONINI in the last 168 hours.  HbA1C: No results found for: HGBA1C  CBG: No  results for input(s): GLUCAP in the last 168 hours.  Admitting History of Present Illness.    57 year old male with a history of non-Hodgkin's lymphoma this was initially diagnosed in 2012, then T-cell lymphoma February 2018 with metastasis to the lung.  He is on home oxygen, last chemotherapy was with Gemzar and cisplatin on 9/9.  Presented to the emergency room on 9/22 with chief complaint of approximately 5 to 7-day history of intermittent fever.  Had empirically been started on Levaquin as an outpatient but in spite of this fevers persisted.  Also noted to be neutropenic and because of this he was admitted for further evaluation and treatment of neutropenic fever.  He denies shortness of breath, cough, chest pain, nausea vomiting or diarrhea at time of presentation.  He was admitted with a working diagnosis of neutropenic fever and sepsis of unclear etiology cultures were obtained, broad-spectrum antibiotics initiated 9/23: Infectious disease consulted.  Had developed mild worsening and chronic cough and shortness of breath recommended to continue vancomycin and cefepime possible pneumonia.  Was given Granix per hematology with plans to continue this until [place] was over 1000 9/24: Having intermittent fever, cough a little bit worse.  One episode of nausea vomiting after forceful cough.  Room air oxygenation at 77% 9/25: Increased tachypnea.  Fluid challenge administered earlier.  Given IV Lasix.  Chest x-ray obtained showed progressive left lower lobe airspace disease, and persistent bilateral patchy airspace disease, oxygen requirements up to 7 L, CT chest negative for pulmonary emboli.  Moved to stepdown unit, trickle care asked to see for acute on chronic respiratory failure  Review of Systems:   Review of Systems - General ROS: positive for  - fever Psychological ROS: negative ENT ROS: negative Hematological and Lymphatic ROS: positive for - fatigue Endocrine ROS: negative Respiratory ROS:  positive for - cough and shortness of breath Cardiovascular ROS: no chest pain or dyspnea on exertion Gastrointestinal ROS: no abdominal pain, change in bowel habits, or black or bloody stools Genito-Urinary ROS: no dysuria, trouble voiding, or hematuria Musculoskeletal ROS: negative Neurological ROS: no TIA or stroke symptoms  Past Medical History  He,  has a past medical history of History of radiation therapy (12/18/10 to 01/22/11), Hypertension, Lymphoma ([employer]), Neutropenia, Non Hodgkin's lymphoma ([place]), and T-cell lymphoma ([place]) (03/14/2016).   Surgical History    Past Surgical History:  Procedure Laterality Date  . BIOPSY PHARYNX     09/20/10 excision right posterior pharynx  . BONE MARROW ASPIRATION      09/08/10 Biopsy , and clot, left iliac crest  . IR GENERIC HISTORICAL  03/21/2016   IR FLUORO GUIDE PORT INSERTION RIGHT 03/21/2016 Arne Cleveland, MD [facility]-INTERV RAD  . IR GENERIC HISTORICAL  03/21/2016   IR US GUIDE VASC ACCESS  RIGHT 03/21/2016 Arne Cleveland, MD [facility]-INTERV RAD  . PORTACATH PLACEMENT  09/19/10  . TONSILLECTOMY     Bilateral     Social History   Social History   Socioeconomic History  . Marital status: Married    Spouse name: Not on file  . Number of children: Not on file  . Years of education: Not on file  . Highest education level: Not on file  Occupational History  . Not on file  Social Needs  . Financial resource strain: Not on file  . Food insecurity:    Worry: Not on file    Inability: Not on file  . Transportation needs:    Medical: Not on file    Non-medical: Not on file  Tobacco Use  . Smoking status: Former Smoker    Packs/day: 0.50    Years: 5.00    Pack years: 2.50    Types: Cigarettes    Last attempt to quit: 11/30/1990    Years since quitting: 26.8  . Smokeless tobacco: Never Used  Substance and Sexual Activity  . Alcohol use: No  . Drug use: No  . Sexual activity: Not Currently  Lifestyle  . Physical activity:    Days per week:  Not on file    Minutes per session: Not on file  . Stress: Not on file  Relationships  . Social connections:    Talks on phone: Not on file    Gets together: Not on file    Attends religious service: Not on file    Active member of club or organization: Not on file    Attends meetings of clubs or organizations: Not on file    Relationship status: Not on file  . Intimate partner violence:    Fear of current or ex partner: Not on file    Emotionally abused: Not on file    Physically abused: Not on file    Forced sexual activity: Not on file  Other Topics Concern  . Not on file  Social History Narrative   Married, 2 children, Social research officer, government  ,  reports that he quit smoking about 26 years ago. His smoking use included cigarettes. He has a 2.50 pack-year smoking history. He has never used smokeless tobacco. He reports that he does not drink alcohol or use drugs.   Family History   His family history is not on file.   Allergies Allergies  Allergen Reactions  . Penicillins Other (See Comments)    Childhood allergy reaction unknown  Has patient had a PCN reaction causing immediate rash, facial/tongue/throat swelling, SOB or lightheadedness with hypotension: Unknown Has patient had a PCN reaction causing severe rash involving mucus membranes or skin necrosis: Unknown Has patient had a PCN reaction that required hospitalization: Unknown Has patient had a PCN reaction occurring within the last 10 years: Unknown If all of the above answers are "NO", then may proceed with Cephalosporin use.      Home Medications  Prior to Admission medications   Medication Sig Start Date End Date Taking? Authorizing Provider  albuterol (PROVENTIL HFA;VENTOLIN HFA) 108 (90 Base) MCG/ACT inhaler Inhale 2 puffs into the lungs every 6 (six) hours as needed for wheezing or shortness of breath. 09/26/17  Yes Patrecia Pour, Christean Grief, MD  atenolol (TENORMIN) 50 MG tablet Take 50 mg by mouth daily.  08/19/14  Yes  [provider]  benzonatate (TESSALON) 100 MG capsule Take 1 capsule (100 mg total) by mouth 3 (three) times daily as needed for  cough. 09/30/17  Yes [name], NP  cetirizine (ZYRTEC) 10 MG tablet Take 10 mg by mouth daily as needed for allergies.   Yes [provider]  dexamethasone (DECADRON) 4 MG tablet Take 10 tabs daily (40 mg) for 3 days Patient taking differently: Take 40 mg by mouth See admin instructions. Take 10 tabs (40 mg) the day of chemo and  for 3 days after then stop 09/30/17  Yes [name], NP  levofloxacin (LEVAQUIN) 500 MG tablet Take 1 tablet (500 mg total) by mouth daily. 10/10/17  Yes Ladell Pier, MD  lidocaine-prilocaine (EMLA) cream Apply 1 application topically as needed. Apply one hour prior to port access and cover with plastic wrap 03/29/16  Yes Ladell Pier, MD  LORazepam (ATIVAN) 0.5 MG tablet Take 1 tablet (0.5 mg total) by mouth every 8 (eight) hours. Patient taking differently: Take 0.5-1 mg by mouth at bedtime as needed for sleep.  10/07/17  Yes Ladell Pier, MD  ondansetron (ZOFRAN) 8 MG tablet TAKE (1) TABLET BY MOUTH EVERY SIX HOURS AS NEEDED FOR NAUSEA. Patient taking differently: Take 8 mg by mouth every 8 (eight) hours as needed for nausea.  10/02/17  Yes Ladell Pier, MD  prochlorperazine (COMPAZINE) 10 MG tablet Take 1 tablet (10 mg total) by mouth every 6 (six) hours as needed for nausea or vomiting. 09/30/17  Yes [name], NP  XARELTO 20 MG TABS tablet TAKE 1 TABLET DAILY WITH SUPPER. Patient taking differently: Take 20 mg by mouth daily.  06/03/17  Yes Ladell Pier, MD  chlorpheniramine-HYDROcodone (TUSSIONEX PENNKINETIC ER) 10-8 MG/5ML SUER Take 5 mLs by mouth every 12 (twelve) hours as needed for cough. Patient not taking: Reported on 10/07/2017 09/19/17   Harle Stanford., PA-C  clotrimazole (MYCELEX) 10 MG troche Take 1 tablet (10 mg total) by mouth 5 (five) times daily. Patient not taking: Reported on  10/07/2017 09/30/17   [name], NP  predniSONE (DELTASONE) 20 MG tablet Take 2 tablets (40 mg total) by mouth daily with breakfast. Patient not taking: Reported on 10/13/2017 09/27/17   [name], NP      Erick Colace ACNP-BC [place] Pager # [phone] OR # [phone] if no answer

## 2017-10-16 NOTE — Progress Notes (Signed)
Patient currently on high flow [place] ([place]); BiPAP ordered, but patient feels his breathing is improving and does not need BiPAP at this time. RT explained what BiPAP was used for and encouraged patient to let staff know if he felt his breathing was worsening. PCCM consulted; patient remains on [place] at this time. RT will continue to monitor patient.

## 2017-10-16 NOTE — Progress Notes (Signed)
IP PROGRESS NOTE  Subjective:   Events in the last few days noted.  57 year old man with history of refractory lymphoma received the last cycle of chemotherapy on October 07, 2017 utilizing gemcitabine and cisplatin under the care of Dr. [name]. He was hospitalized with neutropenic fever and developed respiratory distress on 10/16/2017 and transferred to stepdown unit.  Clinically, he reports some improvement in his respiratory status after receiving bronchodilators and Lasix.  He has been receiving antibiotics intravenously in the form of cefepime and vancomycin.  Objective:  Vital signs in last 24 hours: Temp:  [98.2 F (36.8 C)-101.3 F (38.5 C)] 98.2 F (36.8 C) (09/25 0900) Pulse Rate:  [92-138] 92 (09/25 1332) Resp:  [20-48] 24 (09/25 1332) BP: (120-170)/(72-97) 161/94 (09/25 1006) SpO2:  [87 %-96 %] 94 % (09/25 1332) Weight change:  Last BM Date: 10/14/17  Intake/Output from previous day: 09/24 0701 - 09/25 0700 In: 1784.8 [P.O.:1; I.V.:1083.8; IV Piggyback:700] Out: -  General: Alert, awake appeared in mild distress. Head: Normocephalic atraumatic. Mouth: mucous membranes moist, pharynx normal without lesions Eyes: No scleral icterus.  Pupils are equal and round reactive to light. Resp: Scattered rhonchi and wheezes noted all throughout his lung fields. Cardio: regular rate and rhythm, S1, S2 normal, no murmur, click, rub or gallop GI: soft, non-tender; bowel sounds normal; no masses,  no organomegaly Musculoskeletal: No joint deformity or effusion. Neurological: No motor, sensory deficits.  Intact deep tendon reflexes. Skin: No rashes or lesions.  Portacath without erythema  Lab Results: Recent Labs    10/15/17 0420 10/16/17 0349  WBC 0.5* 0.5*  HGB 8.2* 8.2*  HCT 23.7* 23.7*  PLT 45* 51*    BMET Recent Labs    10/15/17 0420 10/16/17 0349  NA 143 142  K 4.1 3.8  CL 112* 111  CO2 25 24  GLUCOSE 123* 137*  BUN 13 12  CREATININE 0.83 0.83   CALCIUM 7.7* 8.0*    Studies/Results: Ct Angio Chest Pe W Or Wo Contrast  Result Date: 10/16/2017 CLINICAL DATA:  Tachypnea.  Difficulty breathing.  Tachycardia. EXAM: CT ANGIOGRAPHY CHEST WITH CONTRAST TECHNIQUE: Multidetector CT imaging of the chest was performed using the standard protocol during bolus administration of intravenous contrast. Multiplanar CT image reconstructions and MIPs were obtained to evaluate the vascular anatomy. CONTRAST:  76mL ISOVUE-370 IOPAMIDOL (ISOVUE-370) INJECTION 76% COMPARISON:  09/20/2017 FINDINGS: Cardiovascular: Motion artifact degrades the exam. There are no obvious filling defects in the pulmonary arterial tree to suggest acute pulmonary thromboembolism. Hilar adenopathy does cause mass effect upon the pulmonary artery branches as they pass through the hilar regions. No obvious aortic aneurysm or dissection. No significant coronary artery calcification. Right jugular Port-A-Cath tip at the cavoatrial junction. Mediastinum/Nodes: Mediastinal and hilar adenopathy is worse. 2.0 cm precarinal lymph node on image 56. 1.6 cm prevascular node on image 55. 2.9 cm right hilar nodal mass on image 65. 1.3 cm left hilar node on image 68. Right axillary adenopathy is stable. 4.7 x 2.6 cm right axillary mass previously measured 4.8 x 2.4 cm. This is stable. Physiologic pericardial fluid. Visualized thyroid and esophagus are within normal limits. Lungs/Pleura: Innumerable masses throughout both lungs are not significantly changed. Associated ground-glass opacities and interlobular septal thickening have developed throughout both lungs with a central distribution. Left lower lobe consolidation is worse. Bilateral pleural effusions are increasing, small in left and tiny on the right. No pneumothorax. Upper Abdomen: Stable liver cysts. Musculoskeletal: No vertebral compression deformity. Motion artifact in the mid sternum. Review  of the MIP images confirms the above findings. IMPRESSION:  No evidence of acute pulmonary thromboembolism. Compared to the prior study, mediastinal adenopathy, hilar adenopathy and bilateral central ground-glass opacities of developed. Pulmonary masses and right axillary adenopathy is stable. These findings suggest volume overload with engorgement of mediastinal nodes favored over worsening malignancy. Increasing consolidation in the left lower lobe. This may represent worsening pneumonia or tumor infiltration. Electronically Signed   By: Marybelle Killings M.D.   On: 10/16/2017 08:51     Medications: I have reviewed the patient's current medications.  Assessment/Plan:  57 year old man with the following:  1.  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  He had multiple therapies in the past.  He received the first cycle of chemotherapy utilizing gemcitabine and cisplatin on October 07, 2017.  He is scheduled to proceed with cycle 2 of chemotherapy on 10/21/2017.  Given his recent hospitalization as well as his cytopenias, chemotherapy will likely be delayed until his acute illness has resolved at this time.  2.  Respiratory failure: It appears to be multifactorial in nature with evidence of fluid overload and possible infectious process.  His CT scan of the chest was personally reviewed obtained on 10/16/2017 and showed very little changes in his adenopathy to suggest disease progression.  I appreciate pulmonary medicine input and management at this time.  He is receiving bronchodilators and diuretics.  I recommend to trial of Solu-Medrol if no contraindication or objection from pulmonary standpoint.  I will defer to their preference depending on his clinical status.  3.  Pancytopenia: Related to systemic chemotherapy.  He does not require any packed red cell or platelet transfusion.  He continues to be on Granix which I have recommended continue until his [place] is above 1000.  4.  Fever: He remains on broad-spectrum antibiotics with cultures are  negative at this time.  Appreciate input from infectious disease at this time.  25  minutes was spent on the unit as well as face-to-face today.  More than 50% of time was dedicated to reviewing imaging studies, discussing the natural course of his illness, treatment choices and answering questions regarding future plan of care.    LOS: 3 days   Zola Button 10/16/2017, 1:49 PM

## 2017-10-16 NOTE — Care Management Note (Signed)
Case Management Note  Patient Details  Name: ROYSTON BEKELE MRN: [id number] Date of Birth: 28-Jun-1960  Subjective/Objective:                  Difficulty breathing, transferred to icu am of [id number] due to increased o2 demands/Iv maxipime, iv vanco, hfnc at 6l/[place]  Action/Plan: Following for progression of care and level of care. Following for cm needs, none present at this time.  Expected Discharge Date:  10/15/17               Expected Discharge Plan:  Home/Self Care  In-House Referral:     Discharge planning Services  CM Consult  Post Acute Care Choice:    Choice offered to:     DME Arranged:    DME Agency:     HH Arranged:    HH Agency:     Status of Service:  In process, will continue to follow  If discussed at Long Length of Stay Meetings, dates discussed:    Additional Comments:  Leeroy Cha, RN 10/16/2017, 9:29 AM

## 2017-10-16 NOTE — Progress Notes (Signed)
Patient ID: [name], male   DOB: 1960-09-03, 57 y.o.   MRN: [id number]         [facility]  Date of Admission:  10/13/2017           Day 3 vancomycin        Day 3 cefepime ASSESSMENT: He appears to be defervescing on empiric antibiotics for febrile neutropenia.  I suspect that his increased hypoxia overnight was due to volume overload.  He is over 6 L positive since admission.  Blood and urine cultures are negative.  His respiratory virus panel is negative.  PLAN: 1. Continue current antibiotics  Principal Problem:   Febrile neutropenia (HCC) Active Problems:   T-cell lymphoma (HCC)   Sepsis ([place])   Pancytopenia (HCC)   History of non-Hodgkin's lymphoma   Port catheter in place   Scheduled Meds: . atenolol  50 mg Oral Daily  . budesonide (PULMICORT) nebulizer solution  0.5 mg Nebulization BID  . chlorpheniramine-HYDROcodone  5 mL Oral Once  . furosemide  40 mg Intravenous BID  . iopamidol      . ipratropium-albuterol  3 mL Nebulization TID  . loratadine  10 mg Oral Daily  . LORazepam  0.5 mg Oral Q8H  . [START ON 10/17/2017] methylPREDNISolone (SOLU-MEDROL) injection  60 mg Intravenous Q24H  . Tbo-filgastrim (GRANIX) SQ  480 mcg Subcutaneous q1800   Continuous Infusions: . ceFEPime (MAXIPIME) IV 2 g (10/16/17 1403)  . vancomycin Stopped (10/16/17 1115)   PRN Meds:.acetaminophen **OR** acetaminophen, albuterol, benzonatate, bisacodyl, chlorpheniramine-HYDROcodone, guaiFENesin-dextromethorphan, ibuprofen, lidocaine-prilocaine, magnesium citrate, ondansetron **OR** ondansetron (ZOFRAN) IV, oxyCODONE, prochlorperazine, senna-docusate, sodium chloride flush, zolpidem   SUBJECTIVE: He developed progressive dyspnea and hypoxia yesterday afternoon and through the evening.  He was moved to the intensive care unit.  He has received Lasix today and is feeling much better. His dry cough has improved.  Review of Systems: Review of Systems    Constitutional: Positive for chills, diaphoresis, fever, malaise/fatigue and weight loss.  HENT: Negative for congestion and sore throat.   Respiratory: Positive for cough and shortness of breath. Negative for hemoptysis and sputum production.   Cardiovascular: Negative for chest pain.  Gastrointestinal: Negative for abdominal pain, diarrhea, nausea and vomiting.       He denies perirectal pain.  Genitourinary: Negative for dysuria, frequency and urgency.  Musculoskeletal: Negative for back pain, joint pain, myalgias and neck pain.  Skin: Negative for rash.  Neurological: Negative for headaches.    Allergies  Allergen Reactions  . Penicillins Other (See Comments)    Childhood allergy reaction unknown  Has patient had a PCN reaction causing immediate rash, facial/tongue/throat swelling, SOB or lightheadedness with hypotension: Unknown Has patient had a PCN reaction causing severe rash involving mucus membranes or skin necrosis: Unknown Has patient had a PCN reaction that required hospitalization: Unknown Has patient had a PCN reaction occurring within the last 10 years: Unknown If all of the above answers are "NO", then may proceed with Cephalosporin use.     OBJECTIVE: Vitals:   10/16/17 1300 10/16/17 1332 10/16/17 1400 10/16/17 1500  BP: 127/70  116/64 116/78  Pulse: 90 92 96 90  Resp: (!) 38 (!) 24 (!) 34 (!) 34  Temp: 99.7 F (37.6 C)  99.1 F (37.3 C) 98.8 F (37.1 C)  TempSrc: Core  Core Core  SpO2: 94% 94% 94% 92%  Weight:      Height:       Body mass index is 26.24  kg/m.  Physical Exam  Constitutional: He is oriented to person, place, and time.  He is alert and comfortable sitting up in bed.   HENT:  Mouth/Throat: No oropharyngeal exudate.  Eyes: Conjunctivae are normal.  Neck: Neck supple.  Cardiovascular: Normal rate, regular rhythm and normal heart sounds.  No murmur heard. Pulmonary/Chest: Effort normal. He has no wheezes. He has rales.  Port-A-Cath  site appears normal.  Abdominal: Soft. He exhibits no distension. There is no tenderness.  Musculoskeletal: Normal range of motion. He exhibits no edema or tenderness.  Neurological: He is alert and oriented to person, place, and time.  Skin: No rash noted.  Psychiatric: He has a normal mood and affect.    Lab Results Lab Results  Component Value Date   WBC 0.5 (LL) 10/16/2017   HGB 8.2 (L) 10/16/2017   HCT 23.7 (L) 10/16/2017   MCV 93.3 10/16/2017   PLT 51 (L) 10/16/2017    Lab Results  Component Value Date   CREATININE 0.83 10/16/2017   BUN 12 10/16/2017   NA 142 10/16/2017   K 3.8 10/16/2017   CL 111 10/16/2017   CO2 24 10/16/2017    Lab Results  Component Value Date   ALT 21 10/13/2017   AST 29 10/13/2017   ALKPHOS 30 (L) 10/13/2017   BILITOT 1.1 10/13/2017     Microbiology: Recent Results (from the past 240 hour(s))  Culture, blood (Routine x 2)     Status: None (Preliminary result)   Collection Time: 10/13/17  4:05 PM  Result Value Ref Range Status   Specimen Description   Final    PORTA CATH Performed at [facility], [place] [address] [place], [place] [address]    Special Requests   Final    BOTTLES DRAWN AEROBIC AND ANAEROBIC Blood Culture adequate volume Performed at [place] [address] [place], [place] [address]    Culture   Final    NO GROWTH 3 DAYS Performed at [facility], [place] [address], [place], [place] [address]    Report Status PENDING  Incomplete  Urine Culture     Status: None   Collection Time: 10/13/17  6:29 PM  Result Value Ref Range Status   Specimen Description   Final    URINE, RANDOM Performed at [place] [address], [place], [place] [address]    Special Requests   Final    NONE Performed at [facility], [place] [address] [place], [place] [address]    Culture   Final    NO GROWTH Performed at [facility], [place] [address], [place], [place] [address]    Report Status 10/15/2017 FINAL  Final  Culture, blood (Routine x 2)     Status: None (Preliminary result)   Collection Time: 10/14/17  4:57 AM  Result Value Ref Range Status   Specimen Description   Final    BLOOD RIGHT HAND Performed at [place] [address] [place], [place] [address]    Special Requests   Final    BOTTLES DRAWN AEROBIC ONLY Blood Culture results may not be optimal due to an inadequate volume of blood received in culture bottles Performed at [place] [address] [place], [place] [address]    Culture   Final    NO GROWTH 2 DAYS Performed at [place] [address], [place], [place] [address]  Report Status PENDING  Incomplete  Respiratory Panel by PCR     Status: None   Collection Time: 10/15/17  3:03 PM  Result Value Ref Range Status   Adenovirus NOT DETECTED NOT DETECTED Final   Coronavirus 229E NOT DETECTED NOT DETECTED Final   Coronavirus HKU1 NOT DETECTED NOT DETECTED Final   Coronavirus NL63 NOT DETECTED NOT DETECTED Final   Coronavirus OC43 NOT DETECTED NOT DETECTED Final   Metapneumovirus NOT DETECTED NOT DETECTED Final   Rhinovirus / Enterovirus NOT DETECTED NOT DETECTED Final   Influenza A NOT DETECTED NOT DETECTED Final   Influenza B NOT DETECTED NOT DETECTED Final   Parainfluenza Virus 1 NOT DETECTED NOT DETECTED Final   Parainfluenza Virus 2 NOT DETECTED NOT DETECTED Final   Parainfluenza Virus 3 NOT DETECTED NOT DETECTED Final   Parainfluenza Virus 4 NOT DETECTED NOT DETECTED Final   Respiratory Syncytial Virus NOT DETECTED NOT DETECTED Final   Bordetella pertussis NOT DETECTED NOT DETECTED Final   Chlamydophila pneumoniae NOT DETECTED NOT DETECTED Final   Mycoplasma pneumoniae NOT DETECTED NOT DETECTED Final    Comment: Performed at [facility], [place] [address] [place], [place] [address]  MRSA PCR Screening     Status: None   Collection Time:  10/16/17  9:20 AM  Result Value Ref Range Status   MRSA by PCR NEGATIVE NEGATIVE Final    Comment:        The GeneXpert MRSA Assay (FDA approved for NASAL specimens only), is one component of a comprehensive MRSA colonization surveillance program. It is not intended to diagnose MRSA infection nor to guide or monitor treatment for MRSA infections. Performed at [facility], [place] [address], [place], [place] [address]     Michel Bickers, [place] [facility] [place] [facility] [phone] pager   [phone] cell 10/16/2017, 3:12 PM

## 2017-10-16 NOTE — Progress Notes (Signed)
Pt labor of breathing continued to increase, pt's family called RN into room. RR 35, pt lung sounds tight w crackles. Paged on-call NP, who came to assess pt at bedside. Pt refused neb at this time. Medical oncology called and consulted, stat CXR obtained, stat CT ordered. 02 increased to 5L nasal cannula. Pt 02 90-92 percent on 5L. HR 110-120s, NP aware and dayshiftt nurse made aware. Hospitalist to round asap.

## 2017-10-16 NOTE — Progress Notes (Signed)
Family called RN to room stating pt was having more difficulty breathing. HR in 110's-120's, O2 sat 88-90% on 6L. Respiratory was called to give breathing tx and to assess pt. Floor CN and rapid response RN was called. MD paged in the process to come assess pt. Stat CT angio, breathing tx, steroids and lasix were given to the pt prior to transfer to CT. Report called to ICU RN, Maudie Mercury and will be transported by rapid response to the stepdown unit. Pt is currently stable at this time.

## 2017-10-16 NOTE — Progress Notes (Signed)
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 Apparently for the past week or so patient has been having off-and-on fevers at home therefore was started on Levaquin about a week ago but despite of this continued to have fever. His last chemo cycle was about 2 weeks ago- gemcitabine and cisplatin. Was also noted to be neutropenic therefore admitted to the hospital for neutropenic fever. Pancultures were performed and he was started on vancomycin, aztreonam and Flagyl. Oncology and infectious disease were consulted.  He was recommended to continue same antibiotics except discontinuing Flagyl.  Granix was started. His counts have started improving. But over the last 24 hours, his breathing worsened, became hypoxic, requiring up to 7 lit of [place] oxygen.   Assessment & Plan:   Principal Problem:   Febrile neutropenia (HCC) Active Problems:   Pancytopenia (HCC)   History of non-Hodgkin's lymphoma   T-cell lymphoma ([place])   Port catheter in place   Sepsis ([place])   Acute respiratory failure with hypoxia   Differential include  worsening pulmonary metastases from lymphoma vs acute pulmonary edema vs PE. VS health care associated pneumonia.  Overnight pt 's respiratory status worsened, with the patient requiring up to 7 lit of [place] oxygen.  Stop IV fluids. One dose of IV lasix and one dose of IV solumedrol.  CT angio of the chest to rule out PE.  IV heparin to be started empirically.  Transfer the patient to step down and PCCM to see the patient.  - respiratory panel pending.  - resume nebs as needed.   Febrile neutropenia: sepsis ruled out. Absolute neutrophil count around 300. T max is 101.3.  Granix given. Empirically on IV antibiotics with vancomycin and cefepime.  Blood cultures negative so far. ID on board and assisting with management.    T cell Lymphoma with mets and h/o non Hodgkin's lymphoma   S/p  last chemo cycle was about 2 weeks ago- gemcitabine and cisplatin.  Dr Learta Codding is his primary oncologist.  On call Oncology Dr Alvy Bimler .     H/o of superficial vein thrombosis:  Was on Xarelto, at home, which was held of thrombocytopenia.  Will empirically start him on IV heparin, until the CT rules out PE.    Hypertension:  Well controlled on atenolol.       DVT prophylaxis: heparin  Code Status: Full code.  Family Communication: multiple family members at bedside.  Disposition Plan: transfer to PCCM and pending clinical improvement.   Consultants:   Oncology Dr Lindi Adie   Pulmonology on call.    Procedures: CT angio of the chest .   Antimicrobials: Vancomycin and cefepime since 9/23.   Subjective: Tachypnea, tachycardic, dyspneic.   Objective: Vitals:   10/16/17 0230 10/16/17 0546 10/16/17 0615 10/16/17 0750  BP:  139/90    Pulse:  (!) 109    Resp: (!) 30 (!) 30 (!) 35   Temp: 98.9 F (37.2 C) 98.3 F (36.8 C) 98.7 F (37.1 C)   TempSrc: Oral Oral Oral   SpO2: 93% 92%  92%  Weight:      Height:        Intake/Output Summary (Last 24 hours) at 10/16/2017 0753 Last data filed at 10/15/2017 2200  Gross per 24 hour  Intake 1784.77 ml  Output -  Net 1784.77 ml   Filed Weights   10/13/17 1907 10/13/17 2121  Weight: 79.4 kg 78.3 kg    Examination:  General exam: in distress on 7 lit of [place] oxygen.  Respiratory system: Bilateral diffuse crackles and rhonchi.  Cardiovascular system: S1 & S2 heard, tachycardic, no JVD or murmer.  Gastrointestinal system: Abdomen is nondistended, soft and nontender. Central nervous system: Alert and oriented. No focal neurological deficits. Extremities: Symmetric 5 x 5 power. Skin: No rashes, lesions or  ulcers Psychiatry: anxious.     Data Reviewed: I have personally reviewed following labs and imaging studies  CBC: Recent Labs  Lab 10/11/17 0920 10/11/17 1440 10/11/17 1558 10/13/17 1605 10/14/17 0338 10/15/17 0420 10/16/17 0349  WBC 0.4* 0.4*  --  0.3* 0.2* 0.5* 0.5*  NEUTROABS 0.2* 0.2*  --  0.1*  --  0.3 0.3  HGB 9.3* 9.2*  --  8.7* 8.1* 8.2* 8.2*  HCT 27.1* 26.9*  --  25.4* 23.2* 23.7* 23.7*  MCV 92.8 93.7  --  92.0 93.5 92.9 93.3  PLT 18* 18* 39* 40* 38* 45* 51*   Basic Metabolic Panel: Recent Labs  Lab 10/13/17 1605 10/13/17 2200 10/14/17 0338 10/15/17 0420 10/16/17 0349  NA 139  --  139 143 142  K 3.6  --  3.7 4.1 3.8  CL 102  --  105 112* 111  CO2 25  --  26 25 24   GLUCOSE 110*  --  106* 123* 137*  BUN 15  --  16 13 12   CREATININE 1.00  --  0.96 0.83 0.83  CALCIUM 8.5*  --  7.8* 7.7* 8.0*  MG  --  1.8  --  1.8 1.8  PHOS  --  2.9  --   --   --    GFR: Estimated Creatinine Clearance: 95 mL/min (by C-G formula based on SCr of 0.83 mg/dL). Liver Function Tests: Recent Labs  Lab 10/13/17 1605  AST 29  ALT 21  ALKPHOS 30*  BILITOT 1.1  PROT 5.8*  ALBUMIN 2.8*   No results for input(s): LIPASE, AMYLASE in the last 168 hours. No results for input(s): AMMONIA in the last 168 hours. Coagulation Profile: Recent Labs  Lab 10/13/17 1605  INR 1.15   Cardiac Enzymes: No results for input(s): CKTOTAL, CKMB, CKMBINDEX, TROPONINI in the last 168 hours. BNP (last 3 results) No results for input(s): PROBNP in the last 8760 hours. HbA1C: No results for input(s): HGBA1C in the last 72 hours. CBG: No results for input(s): GLUCAP in the last 168 hours. Lipid Profile: No results for input(s): CHOL, HDL, LDLCALC, TRIG, CHOLHDL, LDLDIRECT in the last 72 hours. Thyroid Function Tests: No results for input(s): TSH, T4TOTAL, FREET4, T3FREE, THYROIDAB in the last 72 hours. Anemia Panel: No results for input(s): VITAMINB12, FOLATE, FERRITIN, TIBC, IRON,  RETICCTPCT in the last 72 hours. Sepsis Labs: Recent Labs  Lab 10/13/17 1654 10/13/17 1818  LATICACIDVEN 1.99* 1.05    Recent Results (from the past 240 hour(s))  Culture, blood (Routine x 2)     Status: None (Preliminary result)   Collection Time: 10/13/17  4:05 PM  Result Value Ref Range Status   Specimen Description   Final    PORTA CATH Performed at [facility] [address] [place], [place] [address]    Special Requests   Final    BOTTLES DRAWN AEROBIC AND ANAEROBIC Blood Culture adequate volume Performed at  [facility], [place] [address], [place], [place] [address]    Culture   Final    NO GROWTH 2 DAYS Performed at [facility] [address] [place], [place] [address]    Report Status PENDING  Incomplete  Urine Culture     Status: None   Collection Time: 10/13/17  6:29 PM  Result Value Ref Range Status   Specimen Description   Final    URINE, RANDOM Performed at [facility] [address] [place], [place] [address]    Special Requests   Final    NONE Performed at [facility], [place] [address], [place], [place] [address]    Culture   Final    NO GROWTH Performed at [facility], [place] [address], [place], [place] [address]    Report Status 10/15/2017 FINAL  Final  Culture, blood (Routine x 2)     Status: None (Preliminary result)   Collection Time: 10/14/17  4:57 AM  Result Value Ref Range Status   Specimen Description   Final    BLOOD RIGHT HAND Performed at [place] [address] [place], [place] [address]    Special Requests   Final    BOTTLES DRAWN AEROBIC ONLY Blood Culture results may not be optimal due to an inadequate volume of blood received in culture bottles Performed at [place] [address], [place], [place] [address]    Culture   Final    NO GROWTH 1 DAY Performed at [facility], [place]  [address], [place], [place] [address]    Report Status PENDING  Incomplete  Respiratory Panel by PCR     Status: None   Collection Time: 10/15/17  3:03 PM  Result Value Ref Range Status   Adenovirus NOT DETECTED NOT DETECTED Final   Coronavirus 229E NOT DETECTED NOT DETECTED Final   Coronavirus HKU1 NOT DETECTED NOT DETECTED Final   Coronavirus NL63 NOT DETECTED NOT DETECTED Final   Coronavirus OC43 NOT DETECTED NOT DETECTED Final   Metapneumovirus NOT DETECTED NOT DETECTED Final   Rhinovirus / Enterovirus NOT DETECTED NOT DETECTED Final   Influenza A NOT DETECTED NOT DETECTED Final   Influenza B NOT DETECTED NOT DETECTED Final   Parainfluenza Virus 1 NOT DETECTED NOT DETECTED Final   Parainfluenza Virus 2 NOT DETECTED NOT DETECTED Final   Parainfluenza Virus 3 NOT DETECTED NOT DETECTED Final   Parainfluenza Virus 4 NOT DETECTED NOT DETECTED Final   Respiratory Syncytial Virus NOT DETECTED NOT DETECTED Final   Bordetella pertussis NOT DETECTED NOT DETECTED Final   Chlamydophila pneumoniae NOT DETECTED NOT DETECTED Final   Mycoplasma pneumoniae NOT DETECTED NOT DETECTED Final    Comment: Performed at [facility], [place] [address], [place], [place] [address]         Radiology Studies: Dg Chest Port 1 View  Result Date: 10/16/2017 CLINICAL DATA:  Fever and tachypnea.  History of lymphoma EXAM: PORTABLE CHEST 1 VIEW COMPARISON:  October 15, 2017 chest radiograph; chest CT September 20, 2017 FINDINGS: Port-A-Cath tip is in the superior vena cava. No pneumothorax. There is widespread airspace consolidation bilaterally, slightly progressed from 1 day prior. Underlying pulmonary nodular lesions remain throughout the lungs. There is a small left pleural effusion. Heart size and pulmonary vascularity are normal. Adenopathy appreciable on recent CT is not well seen by radiography. There is degenerative change in the thoracic spine. IMPRESSION: Widespread  airspace opacity, slightly increased, most  likely due to widespread pneumonia. Pulmonary nodular lesions throughout the lungs are evident, not felt to be changed. There is a small left pleural effusion. Stable cardiac silhouette. Electronically Signed   By: Lowella Grip III M.D.   On: 10/16/2017 07:10   Dg Chest Port 1 View  Result Date: 10/15/2017 CLINICAL DATA:  Shortness of Breath EXAM: PORTABLE CHEST 1 VIEW COMPARISON:  10/13/2017 FINDINGS: Right Port-A-Cath remains in place, unchanged. Innumerable bilateral pulmonary nodules again noted, stable. Heart is normal size. Small left effusion, stable. No acute bony abnormality. IMPRESSION: Innumerable bilateral pulmonary nodules with small left effusion. No change since prior study. Electronically Signed   By: Rolm Baptise M.D.   On: 10/15/2017 14:02        Scheduled Meds: . atenolol  50 mg Oral Daily  . budesonide (PULMICORT) nebulizer solution  0.5 mg Nebulization BID  . chlorpheniramine-HYDROcodone  5 mL Oral Once  . furosemide  40 mg Intravenous Once  . ipratropium-albuterol  3 mL Nebulization TID  . loratadine  10 mg Oral Daily  . LORazepam  0.5 mg Oral Q8H  . methylPREDNISolone (SOLU-MEDROL) injection  125 mg Intravenous Once  . Tbo-filgastrim (GRANIX) SQ  480 mcg Subcutaneous q1800   Continuous Infusions: . ceFEPime (MAXIPIME) IV 2 g (10/16/17 0320)  . vancomycin 1,000 mg (10/15/17 2201)     LOS: 3 days    Time spent: 38 minutes.     Hosie Poisson, MD [facility] Pager [phone]  If 7PM-7AM, please contact night-coverage [email or web address] Password [facility] 10/16/2017, 7:53 AM

## 2017-10-17 ENCOUNTER — Encounter (HOSPITAL_COMMUNITY): Payer: Self-pay | Admitting: General Practice

## 2017-10-17 DIAGNOSIS — J9601 Acute respiratory failure with hypoxia: Secondary | ICD-10-CM

## 2017-10-17 DIAGNOSIS — A419 Sepsis, unspecified organism: Secondary | ICD-10-CM

## 2017-10-17 LAB — CBC WITH DIFFERENTIAL/PLATELET
BASOS PCT: 1 %
Basophils Absolute: 0 10*3/uL (ref 0.0–0.1)
EOS ABS: 0 10*3/uL (ref 0.0–0.7)
EOS PCT: 2 %
HEMATOCRIT: 21.5 % — AB (ref 39.0–52.0)
Hemoglobin: 7.5 g/dL — ABNORMAL LOW (ref 13.0–17.0)
LYMPHS ABS: 0.1 10*3/uL (ref 0.7–4.0)
Lymphocytes Relative: 10 %
MCH: 32.2 pg (ref 26.0–34.0)
MCHC: 34.9 g/dL (ref 30.0–36.0)
MCV: 92.3 fL (ref 78.0–100.0)
MONO ABS: 0.3 10*3/uL (ref 0.1–1.0)
MONOS PCT: 22 %
Neutro Abs: 0.8 10*3/uL (ref 1.7–7.7)
Neutrophils Relative %: 65 %
PLATELETS: 64 10*3/uL — AB (ref 150–400)
RBC: 2.33 MIL/uL — ABNORMAL LOW (ref 4.22–5.81)
RDW: 14.5 % (ref 11.5–15.5)
WBC: 1.2 10*3/uL — CL (ref 4.0–10.5)

## 2017-10-17 LAB — MAGNESIUM: Magnesium: 1.7 mg/dL (ref 1.7–2.4)

## 2017-10-17 LAB — BASIC METABOLIC PANEL
Anion gap: 9 (ref 5–15)
BUN: 22 mg/dL — AB (ref 6–20)
CALCIUM: 8.3 mg/dL — AB (ref 8.9–10.3)
CO2: 26 mmol/L (ref 22–32)
CREATININE: 0.77 mg/dL (ref 0.61–1.24)
Chloride: 106 mmol/L (ref 98–111)
Glucose, Bld: 125 mg/dL — ABNORMAL HIGH (ref 70–99)
Potassium: 3.6 mmol/L (ref 3.5–5.1)
SODIUM: 141 mmol/L (ref 135–145)

## 2017-10-17 LAB — PROCALCITONIN: Procalcitonin: 0.36 ng/mL

## 2017-10-17 MED ORDER — KETOROLAC TROMETHAMINE 30 MG/ML IJ SOLN
30.0000 mg | Freq: Once | INTRAMUSCULAR | Status: AC
  Administered 2017-10-17: 30 mg via INTRAVENOUS
  Filled 2017-10-17: qty 1

## 2017-10-17 MED ORDER — METHYLPREDNISOLONE SODIUM SUCC 40 MG IJ SOLR
30.0000 mg | Freq: Once | INTRAMUSCULAR | Status: AC
  Administered 2017-10-17: 30 mg via INTRAVENOUS
  Filled 2017-10-17: qty 1

## 2017-10-17 NOTE — Progress Notes (Signed)
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 Apparently for the past week or so patient has been having off-and-on fevers at home therefore was started on Levaquin about a week ago but despite of this continued to have fever. His last chemo cycle was about 2 weeks ago- gemcitabine and cisplatin. Was also noted to be neutropenic therefore admitted to the hospital for neutropenic fever. Pancultures were performed and he was started on vancomycin, aztreonam and Flagyl. Oncology and infectious disease were consulted.  He was recommended to continue same antibiotics except discontinuing Flagyl.  Granix was started. His counts have started improving. But over the last 24 hours, his breathing worsened, became hypoxic, requiring up to 7 lit of [place] oxygen. He was transferred to step down for closer monitoring.   Assessment & Plan:   Principal Problem:   Febrile neutropenia (HCC) Active Problems:   Pancytopenia (HCC)   History of non-Hodgkin's lymphoma   T-cell lymphoma ([place])   Port catheter in place   Acute respiratory failure with hypoxia (HCC)   Sepsis ([place])   Acute respiratory failure with hypoxia   Differential include  worsening pulmonary metastases from lymphoma vs acute pulmonary edema vs PE. VS health care associated pneumonia.  He was transferred to step down for acute respiratory failure.  CT angio of the chest negative for PE. He was started on IV lasix and diuresed about 2. 8 liters overnight, recommend to continue with IV lasix and IV solumedrol as per oncology.  Resume duo nebs as needed. Appreciate pulmonology recommendations.   Febrile neutropenia: sepsis ruled out. Absolute  neutrophil count around 800 T max is 100.9, On daily Granix. Empirically on IV antibiotics with vancomycin and cefepime. DAY 4.  Blood cultures negative so far. ID on board and assisting with management.    T cell Lymphoma with mets and h/o non Hodgkin's lymphoma   S/p  last chemo cycle was about 2 weeks ago- gemcitabine and cisplatin.  Dr Learta Codding is his primary oncologist. Dr Alen Blew on board .    H/o of superficial vein thrombosis:  Was on Xarelto, at home, which was held of thrombocytopenia.  When platelets normalize, we can restart xarelto.     Hypertension:  Well controlled on atenolol.       DVT prophylaxis: heparin  Code Status: Full code.  Family Communication: multiple family members at bedside.  Disposition Plan: pending clinical improvement.   Consultants:   Oncology Dr Lindi Adie   Pulmonology on call.    Procedures: CT angio of the chest .   Antimicrobials: Vancomycin and cefepime since 9/23.   Subjective: Requiring up to 12 lit of [place] oxygen. Still coughing ,but better than yesterday.  No chest pain.   Objective: Vitals:   10/17/17 0600 10/17/17 0800 10/17/17 0929 10/17/17 1200  BP: 108/64 124/77 132/73   Pulse: 84 90 96 92  Resp: (!) 23 (!) 34  (!) 29  Temp: 97.9 F (36.6 C) 97.7 F (36.5 C)  98.8 F (37.1 C)  TempSrc:  Core    SpO2: 98%   91%  Weight:      Height:        Intake/Output Summary (Last 24 hours) at 10/17/2017 1242 Last data filed at  10/17/2017 1131 Gross per 24 hour  Intake 1383.86 ml  Output 2870 ml  Net -1486.14 ml   Filed Weights   10/13/17 1907 10/13/17 2121  Weight: 79.4 kg 78.3 kg    Examination:  General exam: on 12 lit of [place] oxygen.  Respiratory system: Bilateral diffuse crackles and rhonchi improved from yesterday. Cardiovascular system: S1 & S2 heard, tachycardic, no JVD or murmer.  Gastrointestinal system: Abdomen is nondistended, soft and nontender. Central nervous system: Alert and oriented. No focal  neurological deficits. Extremities: Symmetric 5 x 5 power. Skin: No rashes, lesions or ulcers Psychiatry: anxious.     Data Reviewed: I have personally reviewed following labs and imaging studies  CBC: Recent Labs  Lab 10/11/17 1440  10/13/17 1605 10/14/17 0338 10/15/17 0420 10/16/17 0349 10/17/17 0602  WBC 0.4*  --  0.3* 0.2* 0.5* 0.5* 1.2*  NEUTROABS 0.2*  --  0.1*  --  0.3 0.3 0.8  HGB 9.2*  --  8.7* 8.1* 8.2* 8.2* 7.5*  HCT 26.9*  --  25.4* 23.2* 23.7* 23.7* 21.5*  MCV 93.7  --  92.0 93.5 92.9 93.3 92.3  PLT 18*   < > 40* 38* 45* 51* 64*   < > = values in this interval not displayed.   Basic Metabolic Panel: Recent Labs  Lab 10/13/17 1605 10/13/17 2200 10/14/17 0338 10/15/17 0420 10/16/17 0349 10/17/17 0602  NA 139  --  139 143 142 141  K 3.6  --  3.7 4.1 3.8 3.6  CL 102  --  105 112* 111 106  CO2 25  --  26 25 24 26   GLUCOSE 110*  --  106* 123* 137* 125*  BUN 15  --  16 13 12  22*  CREATININE 1.00  --  0.96 0.83 0.83 0.77  CALCIUM 8.5*  --  7.8* 7.7* 8.0* 8.3*  MG  --  1.8  --  1.8 1.8 1.7  PHOS  --  2.9  --   --   --   --    GFR: Estimated Creatinine Clearance: 98.6 mL/min (by C-G formula based on SCr of 0.77 mg/dL). Liver Function Tests: Recent Labs  Lab 10/13/17 1605  AST 29  ALT 21  ALKPHOS 30*  BILITOT 1.1  PROT 5.8*  ALBUMIN 2.8*   No results for input(s): LIPASE, AMYLASE in the last 168 hours. No results for input(s): AMMONIA in the last 168 hours. Coagulation Profile: Recent Labs  Lab 10/13/17 1605  INR 1.15   Cardiac Enzymes: No results for input(s): CKTOTAL, CKMB, CKMBINDEX, TROPONINI in the last 168 hours. BNP (last 3 results) No results for input(s): PROBNP in the last 8760 hours. HbA1C: No results for input(s): HGBA1C in the last 72 hours. CBG: No results for input(s): GLUCAP in the last 168 hours. Lipid Profile: No results for input(s): CHOL, HDL, LDLCALC, TRIG, CHOLHDL, LDLDIRECT in the last 72 hours. Thyroid Function  Tests: No results for input(s): TSH, T4TOTAL, FREET4, T3FREE, THYROIDAB in the last 72 hours. Anemia Panel: No results for input(s): VITAMINB12, FOLATE, FERRITIN, TIBC, IRON, RETICCTPCT in the last 72 hours. Sepsis Labs: Recent Labs  Lab 10/13/17 1654 10/13/17 1818  LATICACIDVEN 1.99* 1.05    Recent Results (from the past 240 hour(s))  Culture, blood (Routine x 2)     Status: None (Preliminary result)   Collection Time: 10/13/17  4:05 PM  Result Value Ref Range Status   Specimen Description   Final    PORTA CATH Performed at [facility]  Hospital, [place] [address], [place], [place] [address]    Special Requests   Final    BOTTLES DRAWN AEROBIC AND ANAEROBIC Blood Culture adequate volume Performed at [place] [address] [place], [place] [address]    Culture   Final    NO GROWTH 4 DAYS Performed at [facility], [place] [address] [place], [place] [address]    Report Status PENDING  Incomplete  Urine Culture     Status: None   Collection Time: 10/13/17  6:29 PM  Result Value Ref Range Status   Specimen Description   Final    URINE, RANDOM Performed at [place] [address], [place], [place] [address]    Special Requests   Final    NONE Performed at [facility], [place] [address], [place], [place] [address]    Culture   Final    NO GROWTH Performed at [facility], [place] [address], [place], [place] [address]    Report Status 10/15/2017 FINAL  Final  Culture, blood (Routine x 2)     Status: None (Preliminary result)   Collection Time: 10/14/17  4:57 AM  Result Value Ref Range Status   Specimen Description   Final    BLOOD RIGHT HAND Performed at [place] [address] [place], [place] [address]    Special Requests   Final    BOTTLES DRAWN AEROBIC ONLY Blood Culture results may not be optimal due to an inadequate volume of blood received in culture  bottles Performed at [facility] [address], [place], [place] [address]    Culture   Final    NO GROWTH 3 DAYS Performed at [facility], [place] [address], [place], [place] [address]    Report Status PENDING  Incomplete  Respiratory Panel by PCR     Status: None   Collection Time: 10/15/17  3:03 PM  Result Value Ref Range Status   Adenovirus NOT DETECTED NOT DETECTED Final   Coronavirus 229E NOT DETECTED NOT DETECTED Final   Coronavirus HKU1 NOT DETECTED NOT DETECTED Final   Coronavirus NL63 NOT DETECTED NOT DETECTED Final   Coronavirus OC43 NOT DETECTED NOT DETECTED Final   Metapneumovirus NOT DETECTED NOT DETECTED Final   Rhinovirus / Enterovirus NOT DETECTED NOT DETECTED Final   Influenza A NOT DETECTED NOT DETECTED Final   Influenza B NOT DETECTED NOT DETECTED Final   Parainfluenza Virus 1 NOT DETECTED NOT DETECTED Final   Parainfluenza Virus 2 NOT DETECTED NOT DETECTED Final   Parainfluenza Virus 3 NOT DETECTED NOT DETECTED Final   Parainfluenza Virus 4 NOT DETECTED NOT DETECTED Final   Respiratory Syncytial Virus NOT DETECTED NOT DETECTED Final   Bordetella pertussis NOT DETECTED NOT DETECTED Final   Chlamydophila pneumoniae NOT DETECTED NOT DETECTED Final   Mycoplasma pneumoniae NOT DETECTED NOT DETECTED Final    Comment: Performed at [facility], [place] [address], [place], [place] [address]  MRSA PCR Screening     Status: None   Collection Time: 10/16/17  9:20 AM  Result Value Ref Range Status   MRSA by PCR NEGATIVE NEGATIVE Final    Comment:        The GeneXpert MRSA Assay (FDA approved for NASAL specimens only), is one component of a comprehensive MRSA colonization surveillance program. It is not intended to diagnose MRSA infection nor to guide or monitor treatment for MRSA infections. Performed at  [facility], [place] [address], [place], [place] [address]          Radiology Studies: Ct Angio Chest Pe W  Or Wo Contrast  Result Date: 10/16/2017 CLINICAL DATA:  Tachypnea.  Difficulty breathing.  Tachycardia. EXAM: CT ANGIOGRAPHY CHEST WITH CONTRAST TECHNIQUE: Multidetector CT imaging of the chest was performed using the standard protocol during bolus administration of intravenous contrast. Multiplanar CT image reconstructions and MIPs were obtained to evaluate the vascular anatomy. CONTRAST:  24mL ISOVUE-370 IOPAMIDOL (ISOVUE-370) INJECTION 76% COMPARISON:  09/20/2017 FINDINGS: Cardiovascular: Motion artifact degrades the exam. There are no obvious filling defects in the pulmonary arterial tree to suggest acute pulmonary thromboembolism. Hilar adenopathy does cause mass effect upon the pulmonary artery branches as they pass through the hilar regions. No obvious aortic aneurysm or dissection. No significant coronary artery calcification. Right jugular Port-A-Cath tip at the cavoatrial junction. Mediastinum/Nodes: Mediastinal and hilar adenopathy is worse. 2.0 cm precarinal lymph node on image 56. 1.6 cm prevascular node on image 55. 2.9 cm right hilar nodal mass on image 65. 1.3 cm left hilar node on image 68. Right axillary adenopathy is stable. 4.7 x 2.6 cm right axillary mass previously measured 4.8 x 2.4 cm. This is stable. Physiologic pericardial fluid. Visualized thyroid and esophagus are within normal limits. Lungs/Pleura: Innumerable masses throughout both lungs are not significantly changed. Associated ground-glass opacities and interlobular septal thickening have developed throughout both lungs with a central distribution. Left lower lobe consolidation is worse. Bilateral pleural effusions are increasing, small in left and tiny on the right. No pneumothorax. Upper Abdomen: Stable liver cysts. Musculoskeletal: No vertebral compression deformity. Motion artifact in the mid sternum. Review of the MIP images confirms the above findings. IMPRESSION: No evidence of acute pulmonary thromboembolism. Compared to the  prior study, mediastinal adenopathy, hilar adenopathy and bilateral central ground-glass opacities of developed. Pulmonary masses and right axillary adenopathy is stable. These findings suggest volume overload with engorgement of mediastinal nodes favored over worsening malignancy. Increasing consolidation in the left lower lobe. This may represent worsening pneumonia or tumor infiltration. Electronically Signed   By: Marybelle Killings M.D.   On: 10/16/2017 08:51   Dg Chest Port 1 View  Result Date: 10/16/2017 CLINICAL DATA:  Cough and weakness EXAM: PORTABLE CHEST 1 VIEW COMPARISON:  10/16/2017 FINDINGS: Cardiac shadow is mildly enlarged but accentuated by the portable technique. Right chest wall port is again seen. Combination of nodular densities and infiltrative densities are again identified bilaterally stable from the prior exam. Left pleural effusion is again seen. No acute bony abnormality is noted. IMPRESSION: Stable appearance of the chest from the previous day. Electronically Signed   By: Inez Catalina M.D.   On: 10/16/2017 15:58   Dg Chest Port 1 View  Result Date: 10/16/2017 CLINICAL DATA:  Fever and tachypnea.  History of lymphoma EXAM: PORTABLE CHEST 1 VIEW COMPARISON:  October 15, 2017 chest radiograph; chest CT September 20, 2017 FINDINGS: Port-A-Cath tip is in the superior vena cava. No pneumothorax. There is widespread airspace consolidation bilaterally, slightly progressed from 1 day prior. Underlying pulmonary nodular lesions remain throughout the lungs. There is a small left pleural effusion. Heart size and pulmonary vascularity are normal. Adenopathy appreciable on recent CT is not well seen by radiography. There is degenerative change in the thoracic spine. IMPRESSION: Widespread airspace opacity, slightly increased, most likely due to widespread pneumonia. Pulmonary nodular lesions throughout the lungs are evident, not felt to be changed. There is a small left pleural  effusion. Stable  cardiac silhouette. Electronically Signed   By: Lowella Grip III M.D.   On: 10/16/2017 07:10   Dg Chest Port 1 View  Result Date: 10/15/2017 CLINICAL DATA:  Shortness of Breath EXAM: PORTABLE CHEST 1 VIEW COMPARISON:  10/13/2017 FINDINGS: Right Port-A-Cath remains in place, unchanged. Innumerable bilateral pulmonary nodules again noted, stable. Heart is normal size. Small left effusion, stable. No acute bony abnormality. IMPRESSION: Innumerable bilateral pulmonary nodules with small left effusion. No change since prior study. Electronically Signed   By: Rolm Baptise M.D.   On: 10/15/2017 14:02        Scheduled Meds: . atenolol  50 mg Oral Daily  . budesonide (PULMICORT) nebulizer solution  0.5 mg Nebulization BID  . chlorpheniramine-HYDROcodone  5 mL Oral Once  . furosemide  40 mg Intravenous BID  . ipratropium-albuterol  3 mL Nebulization TID  . loratadine  10 mg Oral Daily  . LORazepam  0.5 mg Oral Q8H  . methylPREDNISolone (SOLU-MEDROL) injection  60 mg Intravenous Q24H  . Tbo-filgastrim (GRANIX) SQ  480 mcg Subcutaneous q1800   Continuous Infusions: . ceFEPime (MAXIPIME) IV Stopped (10/17/17 1201)  . vancomycin Stopped (10/17/17 0909)     LOS: 4 days    Time spent: 35 minutes.     Hosie Poisson, MD [facility] Pager [phone]  If 7PM-7AM, please contact night-coverage [email or web address] Password [email or web address] 10/17/2017, 12:42 PM

## 2017-10-17 NOTE — Progress Notes (Signed)
Pharmacy Antibiotic Note  [name] is a 57 y.o. male with PMH of non-Hodgkin's lymphoma admitted on 10/13/2017 with sepsis/febrile neutropenia. Pharmacy initially consulted for Aztreonam dosing, now switching to Cefepime as patient tolerated on previous admission. MD also continued Vancomycin and Metronidazole (d/c  9/24)  Plan: Received Vancomycin 1g IV x 1 in ED on 9/22 at 2256. Continue with Vancomycin 1g IV q12h.   Cefepime 2g IV q8h.   Follow renal function - SCr remains wnl Cultures negative ID recs: continue abx today, fevers defervescing, possible last day antibiotics 9/27.  Height: 5\' 8"  (172.7 cm) Weight: 172 lb 9.6 oz (78.3 kg) IBW/kg (Calculated) : 68.4  Temp (24hrs), Avg:98.7 F (37.1 C), Min:97.7 F (36.5 C), Max:99.7 F (37.6 C)  Recent Labs  Lab 10/13/17 1605 10/13/17 1654 10/13/17 1818 10/14/17 0338 10/15/17 0420 10/16/17 0349 10/17/17 0602  WBC 0.3*  --   --  0.2* 0.5* 0.5* 1.2*  CREATININE 1.00  --   --  0.96 0.83 0.83 0.77  LATICACIDVEN  --  1.99* 1.05  --   --   --   --     Estimated Creatinine Clearance: 98.6 mL/min (by C-G formula based on SCr of 0.77 mg/dL).    Allergies  Allergen Reactions  . Penicillins Other (See Comments)    Childhood allergy reaction unknown  Has patient had a PCN reaction causing immediate rash, facial/tongue/throat swelling, SOB or lightheadedness with hypotension: Unknown Has patient had a PCN reaction causing severe rash involving mucus membranes or skin necrosis: Unknown Has patient had a PCN reaction that required hospitalization: Unknown Has patient had a PCN reaction occurring within the last 10 years: Unknown If all of the above answers are "NO", then may proceed with Cephalosporin use.    Antimicrobials this admission: 9/22 Aztreonam >> 9/23 9/22 Vancomycin >> 9/22 Metronidazole >> 9/23 Cefepime >>  Microbiology results: 9/22 BCx [facility]): NGTD  9/22 UCx: NGF 9/23 BCx: ngtd 9/24 Resp panel PCR: neg 9/25  MRSA PCR: neg  Thank you for allowing pharmacy to be a part of this patient's care.  Minda Ditto PharmD Pager [phone] 10/17/2017, 12:01 PM

## 2017-10-17 NOTE — Progress Notes (Signed)
Shift event note: Per RN pt has had slow increasing 02 demand through-out the shift. RRT reported diffuse crackles so Lasix 40 mg IV was ordered and given.  Persistent tachypnea w/ RR ranging from 26-30 despite good UOP w/ Lasix. 02 has been increased from 2-3 then 4L over several hours to keep 02 sats at 93% or >.  Now unable to get OOB to use urinal w/o dropping sats into the 80's despite increased 02 delivery. Orders placed for stat CXR. At bedside pt noted sitting up in bed in no overt resp distress but RR now ranging from 30-35. 02 sats 93% on 5L [place].Highest temp this shift 100. Pt declined neb stating he does not feel it helps. Pt is still speaking in full sentences though clearly remains tachypnic. BBS with crackles at the bases and diffuse coarse rhonchi through-out. Frequent cough w/ deep breathing.  Wife very concerned and requesting pt get steroids. CXR pending. Assessment/Plan: 1. Acute on chronic hypoxemic respiratory failure: In setting of pt w/ h/o Non-Hodgkin's Lymphoma, currently undergoing chemo for T-cell lymphoma w/ metastatic lung disease on home 02. Current deterioration concerning for possible PE, progression of disease vs infectious process. Discussed pt w/ Dr Alvy Bimler w/ oncology service who has recommended Ct Chest angio to r/o PE, progression of disease and/or infectious process before deciding on treatment plan.. Also requested PCCM be re-consulted for input. Ct chest angio ordered. Discussed pt w/ Dr Mortimer Fries w/ [facility] who has agreed to send PCCM provider to evaluate pt ASAP. Plan discussed w/ pt and wife who are in agreement w/ plan. My colleague, Dr Karleen Hampshire was updated by phone regarding change in pt's status overnight and current plan in place. Pt being prepared to transport to CT. Will defer transfer to higher level of care to rounding MD pending CXR and CT chest angio. Will continue to monitor closely on telemetry.  Jeryl Columbia, NP-C [facility] Pager  [phone]  .CRITICAL CARE Performed by: Jeryl Columbia   Total critical care time: 90 minutes  Critical care time was exclusive of separately billable procedures and treating other patients.  Critical care was necessary to treat or prevent imminent or life-threatening deterioration.  Critical care was time spent personally by me on the following activities: development of treatment plan with patient and/or surrogate as well as nursing, discussions with consultants, evaluation of patient's response to treatment, examination of patient, obtaining history from patient or surrogate, ordering and performing treatments and interventions, ordering and review of laboratory studies, ordering and review of radiographic studies, pulse oximetry and re-evaluation of patient's condition.

## 2017-10-17 NOTE — Progress Notes (Signed)
[facility] Progress Note Patient Name: [name] DOB: [birth year] MRN: [id number]   Date of Service  10/17/2017  HPI/Events of Note  Shortness of breath and fever unresponsive to multiple anti-pyretics  eICU Interventions  Solumedrol 30 mg iv x 1 based on documented previous efficacy with current symptoms. Oncology to address increasing dosing interval of steroids in AM        [name] 10/17/2017, 9:40 PM

## 2017-10-17 NOTE — Progress Notes (Signed)
Patient ID: [name], male   DOB: Dec 13, 1960, 57 y.o.   MRN: [id number]         [facility] for Infectious Disease  Date of Admission:  10/13/2017           Day 4 vancomycin        Day 4 cefepime ASSESSMENT: He is improving and defervesced seen has his white blood cell count recovers.  The source of his recent fevers remains unclear.  If he continues to do well overnight I will stop his empiric antibiotics tomorrow.  PLAN: 1. Continue current antibiotics  Principal Problem:   Febrile neutropenia (HCC) Active Problems:   T-cell lymphoma (HCC)   Sepsis (HCC)   Pancytopenia (HCC)   History of non-Hodgkin's lymphoma   Port catheter in place   Acute on chronic respiratory failure with hypoxia (HCC)   Scheduled Meds: . atenolol  50 mg Oral Daily  . budesonide (PULMICORT) nebulizer solution  0.5 mg Nebulization BID  . chlorpheniramine-HYDROcodone  5 mL Oral Once  . furosemide  40 mg Intravenous BID  . ipratropium-albuterol  3 mL Nebulization TID  . loratadine  10 mg Oral Daily  . LORazepam  0.5 mg Oral Q8H  . methylPREDNISolone (SOLU-MEDROL) injection  60 mg Intravenous Q24H  . Tbo-filgastrim (GRANIX) SQ  480 mcg Subcutaneous q1800   Continuous Infusions: . ceFEPime (MAXIPIME) IV Stopped (10/17/17 0525)  . vancomycin Stopped (10/17/17 0909)   PRN Meds:.acetaminophen **OR** acetaminophen, albuterol, benzonatate, bisacodyl, chlorpheniramine-HYDROcodone, guaiFENesin-dextromethorphan, ibuprofen, lidocaine-prilocaine, magnesium citrate, ondansetron **OR** ondansetron (ZOFRAN) IV, oxyCODONE, prochlorperazine, senna-docusate, sodium chloride flush, zolpidem   SUBJECTIVE: He is feeling much better today.  He is breathing much more easily.  He did not have any fever, chills or sweats overnight.  His dry cough is much improved.  Review of Systems: Review of Systems  Constitutional: Positive for malaise/fatigue and weight loss. Negative for chills, diaphoresis and fever.  HENT:  Negative for congestion and sore throat.   Respiratory: Positive for cough. Negative for hemoptysis, sputum production and shortness of breath.   Cardiovascular: Negative for chest pain.  Gastrointestinal: Negative for abdominal pain, diarrhea, nausea and vomiting.  Genitourinary: Negative for dysuria, frequency and urgency.  Musculoskeletal: Negative for back pain, joint pain, myalgias and neck pain.  Skin: Negative for rash.  Neurological: Negative for headaches.    Allergies  Allergen Reactions  . Penicillins Other (See Comments)    Childhood allergy reaction unknown  Has patient had a PCN reaction causing immediate rash, facial/tongue/throat swelling, SOB or lightheadedness with hypotension: Unknown Has patient had a PCN reaction causing severe rash involving mucus membranes or skin necrosis: Unknown Has patient had a PCN reaction that required hospitalization: Unknown Has patient had a PCN reaction occurring within the last 10 years: Unknown If all of the above answers are "NO", then may proceed with Cephalosporin use.     OBJECTIVE: Vitals:   10/17/17 0400 10/17/17 0500 10/17/17 0600 10/17/17 0929  BP: 119/67 111/66 108/64 132/73  Pulse:  89 84 96  Resp: (!) 29 (!) 28 (!) 23   Temp: 98.2 F (36.8 C) 97.9 F (36.6 C) 97.9 F (36.6 C)   TempSrc:      SpO2:  94% 98%   Weight:      Height:       Body mass index is 26.24 kg/m.  Physical Exam  Constitutional: He is oriented to person, place, and time.  He looks like he is feeling much better.  He is sitting  up in bed talking with his family.  HENT:  Mouth/Throat: No oropharyngeal exudate.  Eyes: Conjunctivae are normal.  Neck: Neck supple.  Cardiovascular: Normal rate, regular rhythm and normal heart sounds.  No murmur heard. Pulmonary/Chest: Effort normal. He has no wheezes. He has no rales.  Port-A-Cath site appears normal.  Abdominal: Soft. He exhibits no distension. There is no tenderness.  Musculoskeletal:  Normal range of motion. He exhibits no edema or tenderness.  Neurological: He is alert and oriented to person, place, and time.  Skin: No rash noted.  Psychiatric: He has a normal mood and affect.    Lab Results Lab Results  Component Value Date   WBC 1.2 (LL) 10/17/2017   HGB 7.5 (L) 10/17/2017   HCT 21.5 (L) 10/17/2017   MCV 92.3 10/17/2017   PLT 64 (L) 10/17/2017    Lab Results  Component Value Date   CREATININE 0.77 10/17/2017   BUN 22 (H) 10/17/2017   NA 141 10/17/2017   K 3.6 10/17/2017   CL 106 10/17/2017   CO2 26 10/17/2017    Lab Results  Component Value Date   ALT 21 10/13/2017   AST 29 10/13/2017   ALKPHOS 30 (L) 10/13/2017   BILITOT 1.1 10/13/2017     Microbiology: Recent Results (from the past 240 hour(s))  Culture, blood (Routine x 2)     Status: None (Preliminary result)   Collection Time: 10/13/17  4:05 PM  Result Value Ref Range Status   Specimen Description   Final    PORTA CATH Performed at [facility], [place] [address], [place], [place] [address]    Special Requests   Final    BOTTLES DRAWN AEROBIC AND ANAEROBIC Blood Culture adequate volume Performed at [place] [address], [place], [place] [address]    Culture   Final    NO GROWTH 4 DAYS Performed at [facility], [place] [address], [place], [place] [address]    Report Status PENDING  Incomplete  Urine Culture     Status: None   Collection Time: 10/13/17  6:29 PM  Result Value Ref Range Status   Specimen Description   Final    URINE, RANDOM Performed at [place] [address] [place], [place] [address]    Special Requests   Final    NONE Performed at [facility], [place] [address] [place], [place] [address]    Culture   Final    NO GROWTH Performed at [facility], [place] [address], [place], [place] [address]    Report Status 10/15/2017 FINAL  Final  Culture, blood (Routine x 2)      Status: None (Preliminary result)   Collection Time: 10/14/17  4:57 AM  Result Value Ref Range Status   Specimen Description   Final    BLOOD RIGHT HAND Performed at [place] [address] [place], [place] [address]    Special Requests   Final    BOTTLES DRAWN AEROBIC ONLY Blood Culture results may not be optimal due to an inadequate volume of blood received in culture bottles Performed at [place] [address], [place], [place] [address]    Culture   Final    NO GROWTH 3 DAYS Performed at [facility], [place] [address], [place],  [address]    Report Status PENDING  Incomplete  Respiratory Panel by PCR     Status: None  Collection Time: 10/15/17  3:03 PM  Result Value Ref Range Status   Adenovirus NOT DETECTED NOT DETECTED Final   Coronavirus 229E NOT DETECTED NOT DETECTED Final   Coronavirus HKU1 NOT DETECTED NOT DETECTED Final   Coronavirus NL63 NOT DETECTED NOT DETECTED Final   Coronavirus OC43 NOT DETECTED NOT DETECTED Final   Metapneumovirus NOT DETECTED NOT DETECTED Final   Rhinovirus / Enterovirus NOT DETECTED NOT DETECTED Final   Influenza A NOT DETECTED NOT DETECTED Final   Influenza B NOT DETECTED NOT DETECTED Final   Parainfluenza Virus 1 NOT DETECTED NOT DETECTED Final   Parainfluenza Virus 2 NOT DETECTED NOT DETECTED Final   Parainfluenza Virus 3 NOT DETECTED NOT DETECTED Final   Parainfluenza Virus 4 NOT DETECTED NOT DETECTED Final   Respiratory Syncytial Virus NOT DETECTED NOT DETECTED Final   Bordetella pertussis NOT DETECTED NOT DETECTED Final   Chlamydophila pneumoniae NOT DETECTED NOT DETECTED Final   Mycoplasma pneumoniae NOT DETECTED NOT DETECTED Final    Comment: Performed at [facility], [place] [address] [place], [place] [address]  MRSA PCR Screening     Status: None   Collection Time: 10/16/17  9:20 AM  Result Value Ref Range Status   MRSA by PCR NEGATIVE NEGATIVE Final    Comment:         The GeneXpert MRSA Assay (FDA approved for NASAL specimens only), is one component of a comprehensive MRSA colonization surveillance program. It is not intended to diagnose MRSA infection nor to guide or monitor treatment for MRSA infections. Performed at [facility], [place] [address] [place], [place] [address]     Michel Bickers, [place] [facility] [phone] pager   [phone] cell 10/17/2017, 10:11 AM

## 2017-10-17 NOTE — Progress Notes (Signed)
IP PROGRESS NOTE  Subjective:   Mr. Plotts feels better this morning with improvement in his overall respiratory status.  He does report some mild cough but no shortness of breath or dyspnea.  He was able to sleep well without any complaints.  He denies any fevers chills or sweats.  He denies any chest pain or palpitation.  He denies any nausea or vomiting or abdominal pain.  He denies any musculoskeletal pain.  Objective:  Vital signs in last 24 hours: Temp:  [97.9 F (36.6 C)-100.9 F (38.3 C)] 97.9 F (36.6 C) (09/26 0600) Pulse Rate:  [83-138] 84 (09/26 0600) Resp:  [23-48] 23 (09/26 0600) BP: (99-170)/(60-97) 108/64 (09/26 0600) SpO2:  [90 %-98 %] 98 % (09/26 0600) Weight change:  Last BM Date: 10/16/17  Intake/Output from previous day: 09/25 0701 - 09/26 0700 In: 954.2 [IV Piggyback:954.2] Out: 2870 [Urine:2870] General: Comfortable appearing gentleman without distress. Head: Atraumatic without abnormalities. Mouth: No thrush or ulcers. Eyes: Pupils are equal and round reactive to light. Resp: Clear with very little rhonchi noted. Cardio: regular rate and rhythm, S1, S2 mild leg edema. GI: soft, non-tender; without any rebound or guarding. Musculoskeletal: No clubbing or cyanosis. Neurological: Intact motor and sensory exam. Skin: No ecchymosis or petechiae.  Portacath without erythema  Lab Results: Recent Labs    10/16/17 0349 10/17/17 0602  WBC 0.5* 1.2*  HGB 8.2* 7.5*  HCT 23.7* 21.5*  PLT 51* 64*    BMET Recent Labs    10/16/17 0349 10/17/17 0602  NA 142 141  K 3.8 3.6  CL 111 106  CO2 24 26  GLUCOSE 137* 125*  BUN 12 22*  CREATININE 0.83 0.77  CALCIUM 8.0* 8.3*        Medications: I have reviewed the patient's current medications.  Assessment/Plan:  57 year old man with the following:  1.  T-cell lymphoma diagnosed in February 2018.  He is currently receiving systemic chemotherapy with the next cycle scheduled for September 30.  Likely  this treatment will be delayed given his acute illness.  2.  Respiratory failure: Improved overnight with bronchodilators and diuretics.  If his respiratory status does not improve a trial of steroid may be reasonable.  Will defer to the primary team as well as pulmonary medicine.  3.  Pancytopenia: Related to chemotherapy and is improving.  I recommend continuing Granix until his [place] fully recovered.  4.  Fever: Low-grade temperature noted with cultures are negative.  He remains on cefepime and vancomycin.  Appreciate input from Dr. Megan Salon and infectious disease.  15  minutes was spent on the unit as well as face-to-face today.  More than 50% of time was dedicated to discussing his laboratory findings, natural course of his disease as well as answering questions regarding plan of care.    LOS: 4 days   Zola Button 10/17/2017, 7:43 AM

## 2017-10-17 NOTE — Progress Notes (Signed)
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  Admitted on 9/22 with neutropenic fever, started on broad-spectrum antibiotics. Critical care consulted 9/25 for acute on chronic hypoxic respiratory failure  Significant Hospital Events    9/23: Infectious disease consulted.  Had developed mild worsening and chronic cough and shortness of breath recommended to continue vancomycin and cefepime possible pneumonia.  Was given Granix per hematology with plans to continue this until [place] was over 1000.  Chest x-ray with diffuse bilateral pulmonary infiltrates chronic in nature since late Aug 2019, 9/24: Having intermittent fever, cough a little bit worse.  One episode of nausea vomiting after forceful cough.  Room air oxygenation at 77% 9/25: Increased tachypnea.  Fluid challenge administered earlier.  Given IV Lasix.  Chest x-ray obtained showed progressive left lower lobe airspace disease, and persistent bilateral patchy airspace disease, oxygen requirements up to 7 L, CT chest negative for pulmonary emboli.  Moved to stepdown unit, trickle care asked to see for acute on chronic respiratory failure.  9/26: feels better. Still on high FIO2 cough and fever improved. Over all looking better. Neg almost 2 liters over 24 hrs but still > 4 liters +   Consults: date of consult/date signed off & final recs:  Pulmonary consulted 9/25  Procedures (surgical and bedside):    Significant Diagnostic Tests:  Echocardiogram from 8/31: Ejection fraction 55 to 60% wall Motion was normal diastolic function normal wall CT angiogram 9/25: Negative for pulmonary edema.  Increased mediastinal adenopathy,  hilar adenopathy, and central groundglass opacities.  Also has left pleural effusion.  This looks small to moderate in size.  Pulmonary masses and right axillary adenopathy are stable.  Increased consolidation left lower lobe.  Micro Data: Respiratory virus panel 9/24: Negative Blood cultures obtained 9/23:>>> Urine culture 9/22: Negative  Antimicrobials:  Aztreonam 9/22 through 9/23 Cefepime 9/23>>> Metronidazole 9/22 through 9/23 Vancomycin 9/22>>>  Subjective:  Feels better   Objective   Blood pressure 132/73, pulse 96, temperature 97.9 F (36.6 C), resp. rate (Abnormal) 23, height 5\' 8"  (1.727 m), weight 78.3 kg, SpO2 98 %.        Intake/Output Summary (Last 24 hours) at 10/17/2017 0934 Last data filed at 10/17/2017 0516 [place] per 24 hour  Intake 954.22 ml  Output 2270 ml  Net -1315.78 ml   Filed Weights   10/13/17 1907 10/13/17 2121  Weight: 79.4 kg 78.3 kg    Examination: General 57 year old male resting in bed. NAD HENT: NCAT. No JVD Pulm: basilar rales decreased Left base Card RRR  abd not tender + bowel sounds Ext brisk CR dependent edema  Neuro intact.   Resolved Hospital Problem list     Assessment & Plan:   Acute on chronic hypoxic respiratory failure in the setting of possible pneumonia, superimposed on underlying pulmonary metastasis and possibly complicated by element of volume overload (which is almost certainly the reason for transfer) Portable chest x-ray personally reviewed: Demonstrates persistent diffuse bilateral infiltrates, these have been present since late August 2019.  His chest x-ray looked fairly stable on admission.  He now demonstrates worsening aeration involving particularly the left base.  CT imaging shows Left basilar consolidation as  well as element of left pleural effusion -Clinically improved with diuresis Plan Cont lasix as able Day #4 vancomycin and cefepime per infectious disease PRN bronchodilators Given clinical  improvement think we can forego bronchoscopy Steroids initiated per recommendations of oncology Wean oxygen    Pancytopenia.  Last chemotherapy approximately 2 weeks ago.  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  Received several fluid boluses over the course of the evening resulting in acute respiratory distress.  Cont lasix as BUN/cr allow. abx per ID. PCCM will sign off.     Diet: Heart healthy Pain/Anxiety/Delirium protocol (if indicated): Not indicated VAP protocol (if indicated): Not indicated DVT prophylaxis: Has been on hold due to thrombocytopenia was on Xarelto previously for superficial vein thrombosis, platelets have been stable/improving will start low molecular weight heparin at prophylaxis dosing 9/25 GI prophylaxis: Not indicated Hyperglycemia protocol: Not indicated Mobility: Bedrest Code Status: Full code Family Communication: Family updated  Labs   CBC: Recent Labs  Lab 10/11/17 1440  10/13/17 1605 10/14/17 0338 10/15/17 0420 10/16/17 0349 10/17/17 0602  WBC 0.4*  --  0.3* 0.2* 0.5* 0.5* 1.2*  NEUTROABS 0.2*  --  0.1*  --  0.3 0.3 0.8  HGB 9.2*  --  8.7* 8.1* 8.2* 8.2* 7.5*  HCT 26.9*  --  25.4* 23.2* 23.7* 23.7* 21.5*  MCV 93.7  --  92.0 93.5 92.9 93.3 92.3  PLT 18*   < > 40* 38* 45* 51* 64*   < > = values in this interval not displayed.    Basic Metabolic  Panel: Recent Labs  Lab 10/13/17 1605 10/13/17 2200 10/14/17 0338 10/15/17 0420 10/16/17 0349 10/17/17 0602  NA 139  --  139 143 142 141  K 3.6  --  3.7 4.1 3.8 3.6  CL 102  --  105 112* 111 106  CO2 25  --  26 25 24 26   GLUCOSE 110*  --  106* 123* 137* 125*  BUN 15  --  16 13 12  22*  CREATININE 1.00  --  0.96 0.83 0.83 0.77  CALCIUM 8.5*  --  7.8* 7.7* 8.0* 8.3*  MG  --  1.8  --  1.8 1.8 1.7  PHOS  --  2.9  --   --   --   --    GFR: Estimated Creatinine Clearance: 98.6 mL/min (by C-G formula based on SCr of 0.77 mg/dL). Recent Labs  Lab 10/13/17 1654 10/13/17 1818 10/14/17 0338 10/15/17 0420 10/16/17 0349 10/17/17 0602  WBC  --   --  0.2* 0.5* 0.5* 1.2*  LATICACIDVEN 1.99* 1.05  --   --   --   --     Liver Function Tests: Recent Labs  Lab 10/13/17 1605  AST 29  ALT 21  ALKPHOS 30*  BILITOT 1.1  PROT 5.8*  ALBUMIN 2.8*   No results for input(s): LIPASE, AMYLASE in the last 168 hours. No results for input(s): AMMONIA in the last 168 hours.  ABG  Component Value Date/Time   PHART 7.418 10/16/2017 0802   PCO2ART 34.8 10/16/2017 0802   PO2ART 36.3 (LL) 10/16/2017 0802   HCO3 22.0 10/16/2017 0802   ACIDBASEDEF 1.5 10/16/2017 0802   O2SAT 62.9 10/16/2017 0802     Coagulation Profile: Recent Labs  Lab 10/13/17 1605  INR 1.15    Cardiac Enzymes: No results for input(s): CKTOTAL, CKMB, CKMBINDEX, TROPONINI in the last 168 hours.  HbA1C: No results found for: HGBA1C  CBG: No results for input(s): GLUCAP in the last 168 hours.     Erick Colace ACNP-BC [place] Pager # [phone] OR # [phone] if no answer

## 2017-10-18 ENCOUNTER — Inpatient Hospital Stay (HOSPITAL_COMMUNITY): Payer: 59

## 2017-10-18 LAB — CBC WITH DIFFERENTIAL/PLATELET
Basophils Absolute: 0 10*3/uL (ref 0.0–0.1)
Basophils Relative: 0 %
EOS PCT: 0 %
Eosinophils Absolute: 0 10*3/uL (ref 0.0–0.7)
HCT: 22.6 % — ABNORMAL LOW (ref 39.0–52.0)
HEMOGLOBIN: 7.9 g/dL — AB (ref 13.0–17.0)
LYMPHS PCT: 3 %
Lymphs Abs: 0.1 10*3/uL — ABNORMAL LOW (ref 0.7–4.0)
MCH: 32.1 pg (ref 26.0–34.0)
MCHC: 35 g/dL (ref 30.0–36.0)
MCV: 91.9 fL (ref 78.0–100.0)
MONOS PCT: 13 %
Monocytes Absolute: 0.6 10*3/uL (ref 0.1–1.0)
NEUTROS PCT: 84 %
Neutro Abs: 4 10*3/uL (ref 1.7–7.7)
Platelets: 69 10*3/uL — ABNORMAL LOW (ref 150–400)
RBC: 2.46 MIL/uL — AB (ref 4.22–5.81)
RDW: 14.7 % (ref 11.5–15.5)
WBC: 4.7 10*3/uL (ref 4.0–10.5)

## 2017-10-18 LAB — BASIC METABOLIC PANEL
Anion gap: 11 (ref 5–15)
BUN: 34 mg/dL — ABNORMAL HIGH (ref 6–20)
CHLORIDE: 104 mmol/L (ref 98–111)
CO2: 26 mmol/L (ref 22–32)
CREATININE: 1.13 mg/dL (ref 0.61–1.24)
Calcium: 8.4 mg/dL — ABNORMAL LOW (ref 8.9–10.3)
GFR calc non Af Amer: >60 mL/min (ref >60)
Glucose, Bld: 167 mg/dL — ABNORMAL HIGH (ref 70–99)
POTASSIUM: 3.7 mmol/L (ref 3.5–5.1)
SODIUM: 141 mmol/L (ref 135–145)

## 2017-10-18 LAB — MAGNESIUM: MAGNESIUM: 1.8 mg/dL (ref 1.7–2.4)

## 2017-10-18 LAB — CULTURE, BLOOD (ROUTINE X 2)
Culture: NO GROWTH
Special Requests: ADEQUATE

## 2017-10-18 LAB — PROCALCITONIN: PROCALCITONIN: 0.47 ng/mL

## 2017-10-18 MED ORDER — FUROSEMIDE 40 MG PO TABS
40.0000 mg | ORAL_TABLET | Freq: Every day | ORAL | Status: DC
  Administered 2017-10-18: 40 mg via ORAL
  Filled 2017-10-18: qty 1

## 2017-10-18 MED ORDER — VANCOMYCIN HCL IN DEXTROSE 750-5 MG/150ML-% IV SOLN
750.0000 mg | Freq: Two times a day (BID) | INTRAVENOUS | Status: DC
  Administered 2017-10-18 – 2017-10-21 (×6): 750 mg via INTRAVENOUS
  Filled 2017-10-18 (×7): qty 150

## 2017-10-18 MED ORDER — KETOROLAC TROMETHAMINE 15 MG/ML IJ SOLN
15.0000 mg | Freq: Once | INTRAMUSCULAR | Status: AC
  Administered 2017-10-18: 15 mg via INTRAVENOUS
  Filled 2017-10-18: qty 1

## 2017-10-18 NOTE — Progress Notes (Signed)
Patient ID: [name], male   DOB: 10/02/1960, 57 y.o.   MRN: [id number]         [facility]  Date of Admission:  10/13/2017           Day 5 vancomycin        Day 5 cefepime ASSESSMENT: Despite recovery of his white blood cells and absolute neutrophil count he is still spiking fevers.  All cultures and respiratory virus panel are negative.  I favor continuing empiric antibiotics for now.  PLAN: 1. Continue current antibiotics  Principal Problem:   Febrile neutropenia (HCC) Active Problems:   T-cell lymphoma (HCC)   Sepsis (HCC)   Pancytopenia (HCC)   History of non-Hodgkin's lymphoma   Port catheter in place   Acute respiratory failure with hypoxia (HCC)   Scheduled Meds: . atenolol  50 mg Oral Daily  . chlorpheniramine-HYDROcodone  5 mL Oral Once  . loratadine  10 mg Oral Daily  . LORazepam  0.5 mg Oral Q8H  . methylPREDNISolone (SOLU-MEDROL) injection  60 mg Intravenous Q24H  . Tbo-filgastrim (GRANIX) SQ  480 mcg Subcutaneous q1800   Continuous Infusions: . ceFEPime (MAXIPIME) IV Stopped (10/18/17 0517)  . vancomycin     PRN Meds:.acetaminophen **OR** acetaminophen, albuterol, benzonatate, bisacodyl, chlorpheniramine-HYDROcodone, guaiFENesin-dextromethorphan, ibuprofen, lidocaine-prilocaine, magnesium citrate, ondansetron **OR** ondansetron (ZOFRAN) IV, oxyCODONE, prochlorperazine, senna-docusate, sodium chloride flush, zolpidem   SUBJECTIVE: He had another fever spike to 102 degrees last night.  He notes that his dry cough is a little worse this morning.  He is still O2 dependent.  Review of Systems: Review of Systems  Constitutional: Positive for chills, diaphoresis, fever, malaise/fatigue and weight loss.  HENT: Negative for congestion and sore throat.   Respiratory: Positive for cough and shortness of breath. Negative for hemoptysis and sputum production.   Cardiovascular: Negative for chest pain.  Gastrointestinal: Negative for  abdominal pain, diarrhea, nausea and vomiting.  Genitourinary: Negative for dysuria, frequency and urgency.  Skin: Negative for rash.  Neurological: Positive for tremors. Negative for headaches.    Allergies  Allergen Reactions  . Penicillins Other (See Comments)    Childhood allergy reaction unknown  Has patient had a PCN reaction causing immediate rash, facial/tongue/throat swelling, SOB or lightheadedness with hypotension: Unknown Has patient had a PCN reaction causing severe rash involving mucus membranes or skin necrosis: Unknown Has patient had a PCN reaction that required hospitalization: Unknown Has patient had a PCN reaction occurring within the last 10 years: Unknown If all of the above answers are "NO", then may proceed with Cephalosporin use.     OBJECTIVE: Vitals:   10/18/17 0500 10/18/17 0600 10/18/17 0900 10/18/17 0903  BP: 96/70 103/68 112/67 112/67  Pulse: 82 83 (!) 106 (!) 108  Resp: (!) 25 (!) 25 (!) 22   Temp: (!) 97 F (36.1 C) (!) 96.4 F (35.8 C) 98.2 F (36.8 C)   TempSrc:      SpO2: 94% 95% (!) 89%   Weight:      Height:       Body mass index is 26.24 kg/m.  Physical Exam  Constitutional: He is oriented to person, place, and time.  He is sitting up in bed talking with his family and Marni Griffon, NP.  HENT:  Mouth/Throat: No oropharyngeal exudate.  Eyes: Conjunctivae are normal.  Cardiovascular: Normal rate, regular rhythm and normal heart sounds.  No murmur heard. Pulmonary/Chest: Effort normal. He has no wheezes. He has rales.  Port-A-Cath site appears  normal.  Neurological: He is alert and oriented to person, place, and time.  Skin: No rash noted.  Psychiatric: He has a normal mood and affect.    Lab Results Lab Results  Component Value Date   WBC 4.7 10/18/2017   HGB 7.9 (L) 10/18/2017   HCT 22.6 (L) 10/18/2017   MCV 91.9 10/18/2017   PLT 69 (L) 10/18/2017    Lab Results  Component Value Date   CREATININE 1.13 10/18/2017    BUN 34 (H) 10/18/2017   NA 141 10/18/2017   K 3.7 10/18/2017   CL 104 10/18/2017   CO2 26 10/18/2017    Lab Results  Component Value Date   ALT 21 10/13/2017   AST 29 10/13/2017   ALKPHOS 30 (L) 10/13/2017   BILITOT 1.1 10/13/2017     Microbiology: Recent Results (from the past 240 hour(s))  Culture, blood (Routine x 2)     Status: None   Collection Time: 10/13/17  4:05 PM  Result Value Ref Range Status   Specimen Description   Final    PORTA CATH Performed at [facility], [place] [address] [place], [place] [address]    Special Requests   Final    BOTTLES DRAWN AEROBIC AND ANAEROBIC Blood Culture adequate volume Performed at [place] [address] [place], [place] [address]    Culture   Final    NO GROWTH 5 DAYS Performed at [facility], [place] [address] [place], [place] [address]    Report Status 10/18/2017 FINAL  Final  Urine Culture     Status: None   Collection Time: 10/13/17  6:29 PM  Result Value Ref Range Status   Specimen Description   Final    URINE, RANDOM Performed at [place] [address], [place], [place] [address]    Special Requests   Final    NONE Performed at [facility], [place] [address], [place], [place] [address]    Culture   Final    NO GROWTH Performed at [facility], [place] [address], [place], [place] [address]    Report Status 10/15/2017 FINAL  Final  Culture, blood (Routine x 2)     Status: None (Preliminary result)   Collection Time: 10/14/17  4:57 AM  Result Value Ref Range Status   Specimen Description   Final    BLOOD RIGHT HAND Performed at [place] [address], [place], [place] [address]    Special Requests   Final    BOTTLES DRAWN AEROBIC ONLY Blood Culture results may not be optimal due to an inadequate volume of blood received in culture bottles Performed at [place]  [address], [place], [place] [address]    Culture   Final    NO GROWTH 4 DAYS Performed at [facility], [place] [address], [place], [place] [address]    Report Status PENDING  Incomplete  Respiratory Panel by PCR     Status: None   Collection Time: 10/15/17  3:03 PM  Result Value Ref Range Status   Adenovirus NOT DETECTED NOT DETECTED Final   Coronavirus 229E NOT DETECTED NOT DETECTED Final   Coronavirus HKU1 NOT DETECTED NOT DETECTED Final   Coronavirus NL63 NOT DETECTED NOT DETECTED Final   Coronavirus OC43 NOT DETECTED NOT DETECTED Final   Metapneumovirus NOT DETECTED NOT DETECTED Final   Rhinovirus / Enterovirus NOT DETECTED NOT DETECTED Final  Influenza A NOT DETECTED NOT DETECTED Final   Influenza B NOT DETECTED NOT DETECTED Final   Parainfluenza Virus 1 NOT DETECTED NOT DETECTED Final   Parainfluenza Virus 2 NOT DETECTED NOT DETECTED Final   Parainfluenza Virus 3 NOT DETECTED NOT DETECTED Final   Parainfluenza Virus 4 NOT DETECTED NOT DETECTED Final   Respiratory Syncytial Virus NOT DETECTED NOT DETECTED Final   Bordetella pertussis NOT DETECTED NOT DETECTED Final   Chlamydophila pneumoniae NOT DETECTED NOT DETECTED Final   Mycoplasma pneumoniae NOT DETECTED NOT DETECTED Final    Comment: Performed at [facility], [place] [address] [place], [place] [address]  MRSA PCR Screening     Status: None   Collection Time: 10/16/17  9:20 AM  Result Value Ref Range Status   MRSA by PCR NEGATIVE NEGATIVE Final    Comment:        The GeneXpert MRSA Assay (FDA approved for NASAL specimens only), is one component of a comprehensive MRSA colonization surveillance program. It is not intended to diagnose MRSA infection nor to guide or monitor treatment for MRSA infections. Performed at [facility], [place] [address] [place], [place] [address]    Chest x-ray 10/18/2017  IMPRESSION: Diffuse bilateral nodular opacities and airspace disease with small left  effusion. No change.    By: Rolm Baptise M.D.   Michel Bickers, MD [facility] [place] [facility] [phone] pager   [phone] cell 10/18/2017, 11:18 AM

## 2017-10-18 NOTE — Progress Notes (Signed)
Pharmacy Antibiotic Note  [name] is a 57 y.o. male with PMH of non-Hodgkin's lymphoma admitted on 10/13/2017 with sepsis/febrile neutropenia. Pharmacy initially consulted for Aztreonam dosing, now switching to Cefepime as patient tolerated on previous admission. MD also continued Vancomycin and Metronidazole (d/c  9/24)  Today, 10/18/2017  Fevers last night (Tm 102)  WBC improved, ANC = 4000 (Onc states G-CSF can be stopped, did not order to stop it)  SCr bumped - On IV Lasix (now stopped) given contrast 9/25 for CTA chest. UOP appears adequate while on on lasix (but again this has been stopped - suspect due to SCr bump).  BUN: SCr ratio increased   Cultures unrevealing  Plan: Change vancomycin to 750mg  IV q12h for SCr rise.   - Furosemide stopped - monitor daily SCr for now  Cefepime 2g IV q8h.    Height: 5\' 8"  (172.7 cm) Weight: 172 lb 9.6 oz (78.3 kg) IBW/kg (Calculated) : 68.4  Temp (24hrs), Avg:99 F (37.2 C), Min:96.4 F (35.8 C), Max:102 F (38.9 C)  Recent Labs  Lab 10/13/17 1654 10/13/17 1818 10/14/17 0338 10/15/17 0420 10/16/17 0349 10/17/17 0602 10/18/17 0634  WBC  --   --  0.2* 0.5* 0.5* 1.2* 4.7  CREATININE  --   --  0.96 0.83 0.83 0.77 1.13  LATICACIDVEN 1.99* 1.05  --   --   --   --   --     Estimated Creatinine Clearance: 69.8 mL/min (by C-G formula based on SCr of 1.13 mg/dL).    Allergies  Allergen Reactions  . Penicillins Other (See Comments)    Childhood allergy reaction unknown  Has patient had a PCN reaction causing immediate rash, facial/tongue/throat swelling, SOB or lightheadedness with hypotension: Unknown Has patient had a PCN reaction causing severe rash involving mucus membranes or skin necrosis: Unknown Has patient had a PCN reaction that required hospitalization: Unknown Has patient had a PCN reaction occurring within the last 10 years: Unknown If all of the above answers are "NO", then may proceed with Cephalosporin use.     Antimicrobials this admission: 9/22 Aztreonam >> 9/23 9/22 Vancomycin >> 9/22 Metronidazole >> 9/23 Cefepime >>  Microbiology results: 9/22 BCx [facility]): NGTD  9/22 UCx: NGF 9/23 BCx: ngtd 9/24 Resp panel PCR: neg 9/25 MRSA PCR: neg  Thank you for allowing pharmacy to be a part of this patient's care.  Doreene Eland, PharmD, BCPS.   Work Cell: [phone] 10/18/2017 9:19 AM

## 2017-10-18 NOTE — Progress Notes (Signed)
IP PROGRESS NOTE  Subjective:   [name] feels reasonably well this morning and overall comfortable.  He still requiring high oxygen supplements although he had a reasonable night overall.  He did have a fever as high as 102.  He denies any chest pain or palpitation.  He denies any dyspnea at rest.  He was able to send in a chair for a period of time yesterday.  He continues to use incentive spirometry.  Objective:  Vital signs in last 24 hours: Temp:  [96.4 F (35.8 C)-102 F (38.9 C)] 96.4 F (35.8 C) (09/27 0600) Pulse Rate:  [79-112] 83 (09/27 0600) Resp:  [25-38] 25 (09/27 0600) BP: (96-143)/(64-87) 103/68 (09/27 0600) SpO2:  [90 %-98 %] 95 % (09/27 0600) Weight change:  Last BM Date: 10/16/17  Intake/Output from previous day: 09/26 0701 - 09/27 0700 In: 958.1 [P.O.:240; IV Piggyback:718.1] Out: 2600 [Urine:2600] General: Alert, awake without distress. Head: Normocephalic atraumatic. Mouth: mucous membranes moist, pharynx normal without lesions Eyes: No scleral icterus.  Pupils are equal and round reactive to light. Resp: Mostly clear with very few rhonchi at the bases. Cardio: regular rate and rhythm, S1, S2 normal, no murmur, click, rub or gallop GI: soft, non-tender; bowel sounds normal; no masses,  no organomegaly Musculoskeletal: No joint deformity or effusion. Neurological: Grossly intact. Skin: No rashes or lesions.  Portacath without erythema  Lab Results: Recent Labs    10/17/17 0602 10/18/17 0634  WBC 1.2* 4.7  HGB 7.5* 7.9*  HCT 21.5* 22.6*  PLT 64* 69*    BMET Recent Labs    10/17/17 0602 10/18/17 0634  NA 141 141  K 3.6 3.7  CL 106 104  CO2 26 26  GLUCOSE 125* 167*  BUN 22* 34*  CREATININE 0.77 1.13  CALCIUM 8.3* 8.4*       Medications: I have reviewed the patient's current medications.  Assessment/Plan:  57 year old man with the following:  1.  T-cell lymphoma diagnosed in February 2018.  Chemotherapy will remain on hold until  he is fully recovered from this episode.  2.  Respiratory failure: Related to fluid overload and possibly related to malignancy.  Pulmonary toxicity from gemcitabine considered less likely.  I recommend continue with supportive measures as you are doing.  Continues to be on Solu-Medrol daily prophylactically.  His respiratory status appears stable but not improving dramatically.  We appreciate input from pulmonary medicine moving forward.  3.  Pancytopenia: Continues to improve at this time.  He does not require any growth factor support or transfusion.  Granix can be discontinued at this time.  4.  Fever: Etiology could be related to white cell recovery versus his primary lymphoma.  Infectious etiology considered less likely at this time given his negative blood culture and prophylactic antibiotics.  I recommend continuing monitoring at this time with continuing Solu-Medrol throughout the weekend.  We will continue to follow with you throughout the weekend.  Dr. [name] will return on Monday.  15  minutes was spent on the unit as well as face-to-face today.  More than 50% of time was dedicated to discussing his complaints last 24 hours, the differential diagnosis for these complaints and management strategy.    LOS: 5 days   Zola Button 10/18/2017, 8:06 AM

## 2017-10-18 NOTE — Progress Notes (Signed)
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 Apparently for the past week or so patient has been having off-and-on fevers at home therefore was started on Levaquin about a week ago but despite of this continued to have fever. His last chemo cycle was about 2 weeks ago- gemcitabine and cisplatin. Was also noted to be neutropenic therefore admitted to the hospital for neutropenic fever. Pancultures were performed and he was started on vancomycin, aztreonam and Flagyl. Oncology and infectious disease were consulted.  He was recommended to continue same antibiotics except discontinuing Flagyl.  Granix was started. His counts have started improving. He was transferred to step down for acute respiratory failure with hypoxia.   Assessment & Plan:   Principal Problem:   Febrile neutropenia (HCC) Active Problems:   Pancytopenia (HCC)   History of non-Hodgkin's lymphoma   T-cell lymphoma ([place])   Port catheter in place   Acute respiratory failure with hypoxia (HCC)   Sepsis ([place])   Acute respiratory failure with hypoxia   Differential include  worsening pulmonary metastases from lymphoma vs acute pulmonary edema  VS health care associated pneumonia.  He was transferred to step down for acute respiratory failure.  CT angio of the chest negative for PE. He was started on IV lasix 40 mg BID and diuresed about 2. 6 liters overnight, . Since his creatinine worsened overnight from 0.7 to 1.1, we will decrease the lasix to 40 mg daily.  Continue with IV solumedrol as per oncology recommendations.   Appreciate pulmonology recommendations.   Febrile neutropenia: sepsis ruled out. Absolute neutrophil  count around 4000 T max is 102, On daily Granix. Empirically on IV antibiotics with vancomycin and cefepime. DAY 5.  Blood cultures negative so far. ID on board and assisting with management.    T cell Lymphoma with mets and h/o non Hodgkin's lymphoma   S/p  last chemo cycle was about 2 weeks ago- gemcitabine and cisplatin.  Dr Learta Codding is his primary oncologist. Dr Alen Blew on board .    H/o of superficial vein thrombosis:  Was on Xarelto, at home, which was held of thrombocytopenia.  When platelets normalize, we can restart xarelto.     Hypertension:  Well controlled on atenolol.    Anxiety:  Prn ativan.    DVT prophylaxis: heparin  Code Status: Full code.  Family Communication: multiple family members at bedside.  Disposition Plan: pending clinical improvement.   Consultants:   Oncology Dr Lindi Adie   Pulmonology on call.    Procedures: CT angio of the chest .   Antimicrobials: Vancomycin and cefepime since 9/23.   Subjective: Requiring up to 15 lit of [place] oxygen. Pt reports breathing is the same as yesterday. Coughing with breathing treatments.  Objective: Vitals:   10/18/17 0600 10/18/17 0900 10/18/17 0903 10/18/17 1105  BP: 103/68 112/67 112/67   Pulse: 83 (!) 106 (!) 108   Resp: (!) 25 (!) 22    Temp: (!) 96.4 F (35.8 C) 98.2 F (36.8 C)    TempSrc:      SpO2: 95% (!) 89%  90%  Weight:      Height:        Intake/Output Summary (Last 24 hours) at 10/18/2017 1204 Last  data filed at 10/18/2017 1000 Gross per 24 hour  Intake 868.47 ml  Output 1500 ml  Net -631.53 ml   Filed Weights   10/13/17 1907 10/13/17 2121  Weight: 79.4 kg 78.3 kg    Examination:  General exam:in mild distress from sob on 15 lit of high flow oxygen.  Respiratory system: basilar rales improved from yesterday. But coarse breath sounds bilateral   Cardiovascular system: S1 & S2 heard, tachycardic, no JVD or murmer.  Gastrointestinal system: Abdomen is nondistended, soft and  nontender. Bowel sounds good.  Central nervous system: Alert and oriented. Non focal.  Extremities: Symmetric 5 x 5 power. No pedal edema.  Skin: No rashes, lesions or ulcers Psychiatry: anxious.     Data Reviewed: I have personally reviewed following labs and imaging studies  CBC: Recent Labs  Lab 10/13/17 1605 10/14/17 0338 10/15/17 0420 10/16/17 0349 10/17/17 0602 10/18/17 0634  WBC 0.3* 0.2* 0.5* 0.5* 1.2* 4.7  NEUTROABS 0.1*  --  0.3 0.3 0.8 4.0  HGB 8.7* 8.1* 8.2* 8.2* 7.5* 7.9*  HCT 25.4* 23.2* 23.7* 23.7* 21.5* 22.6*  MCV 92.0 93.5 92.9 93.3 92.3 91.9  PLT 40* 38* 45* 51* 64* 69*   Basic Metabolic Panel: Recent Labs  Lab 10/13/17 2200 10/14/17 0338 10/15/17 0420 10/16/17 0349 10/17/17 0602 10/18/17 0634  NA  --  139 143 142 141 141  K  --  3.7 4.1 3.8 3.6 3.7  CL  --  105 112* 111 106 104  CO2  --  26 25 24 26 26   GLUCOSE  --  106* 123* 137* 125* 167*  BUN  --  16 13 12  22* 34*  CREATININE  --  0.96 0.83 0.83 0.77 1.13  CALCIUM  --  7.8* 7.7* 8.0* 8.3* 8.4*  MG 1.8  --  1.8 1.8 1.7 1.8  PHOS 2.9  --   --   --   --   --    GFR: Estimated Creatinine Clearance: 69.8 mL/min (by C-G formula based on SCr of 1.13 mg/dL). Liver Function Tests: Recent Labs  Lab 10/13/17 1605  AST 29  ALT 21  ALKPHOS 30*  BILITOT 1.1  PROT 5.8*  ALBUMIN 2.8*   No results for input(s): LIPASE, AMYLASE in the last 168 hours. No results for input(s): AMMONIA in the last 168 hours. Coagulation Profile: Recent Labs  Lab 10/13/17 1605  INR 1.15   Cardiac Enzymes: No results for input(s): CKTOTAL, CKMB, CKMBINDEX, TROPONINI in the last 168 hours. BNP (last 3 results) No results for input(s): PROBNP in the last 8760 hours. HbA1C: No results for input(s): HGBA1C in the last 72 hours. CBG: No results for input(s): GLUCAP in the last 168 hours. Lipid Profile: No results for input(s): CHOL, HDL, LDLCALC, TRIG, CHOLHDL, LDLDIRECT in the last 72 hours. Thyroid Function  Tests: No results for input(s): TSH, T4TOTAL, FREET4, T3FREE, THYROIDAB in the last 72 hours. Anemia Panel: No results for input(s): VITAMINB12, FOLATE, FERRITIN, TIBC, IRON, RETICCTPCT in the last 72 hours. Sepsis Labs: Recent Labs  Lab 10/13/17 1654 10/13/17 1818 10/17/17 1436 10/18/17 0634  PROCALCITON  --   --  0.36 0.47  LATICACIDVEN 1.99* 1.05  --   --     Recent Results (from the past 240 hour(s))  Culture, blood (Routine x 2)     Status: None   Collection Time: 10/13/17  4:05 PM  Result Value Ref Range Status   Specimen Description   Final    PORTA CATH Performed  at [facility], [place] [address] [place], [place] [address]    Special Requests   Final    BOTTLES DRAWN AEROBIC AND ANAEROBIC Blood Culture adequate volume Performed at [place] [address], [place], [place] [address]    Culture   Final    NO GROWTH 5 DAYS Performed at [facility], [place] [address] [place], [place] [address]    Report Status 10/18/2017 FINAL  Final  Urine Culture     Status: None   Collection Time: 10/13/17  6:29 PM  Result Value Ref Range Status   Specimen Description   Final    URINE, RANDOM Performed at [place] [address], [place], [place] [address]    Special Requests   Final    NONE Performed at [facility], [place] [address], [place], [place] [address]    Culture   Final    NO GROWTH Performed at [facility], [place] [address] [place], [place] [address]    Report Status 10/15/2017 FINAL  Final  Culture, blood (Routine x 2)     Status: None (Preliminary result)   Collection Time: 10/14/17  4:57 AM  Result Value Ref Range Status   Specimen Description   Final    BLOOD RIGHT HAND Performed at [place] [address] [place], [place] [address]    Special Requests   Final    BOTTLES DRAWN AEROBIC ONLY Blood Culture results may not be optimal due to  an inadequate volume of blood received in culture bottles Performed at [place] [address], [place], [place] [address]    Culture   Final    NO GROWTH 4 DAYS Performed at [facility], [place] [address], [place], [place] [address]    Report Status PENDING  Incomplete  Respiratory Panel by PCR     Status: None   Collection Time: 10/15/17  3:03 PM  Result Value Ref Range Status   Adenovirus NOT DETECTED NOT DETECTED Final   Coronavirus 229E NOT DETECTED NOT DETECTED Final   Coronavirus HKU1 NOT DETECTED NOT DETECTED Final   Coronavirus NL63 NOT DETECTED NOT DETECTED Final   Coronavirus OC43 NOT DETECTED NOT DETECTED Final   Metapneumovirus NOT DETECTED NOT DETECTED Final   Rhinovirus / Enterovirus NOT DETECTED NOT DETECTED Final   Influenza A NOT DETECTED NOT DETECTED Final   Influenza B NOT DETECTED NOT DETECTED Final   Parainfluenza Virus 1 NOT DETECTED NOT DETECTED Final   Parainfluenza Virus 2 NOT DETECTED NOT DETECTED Final   Parainfluenza Virus 3 NOT DETECTED NOT DETECTED Final   Parainfluenza Virus 4 NOT DETECTED NOT DETECTED Final   Respiratory Syncytial Virus NOT DETECTED NOT DETECTED Final   Bordetella pertussis NOT DETECTED NOT DETECTED Final   Chlamydophila pneumoniae NOT DETECTED NOT DETECTED Final   Mycoplasma pneumoniae NOT DETECTED NOT DETECTED Final    Comment: Performed at [facility], [place] [address] [place],  [address]  MRSA PCR Screening     Status: None   Collection Time: 10/16/17  9:20 AM  Result Value Ref Range Status   MRSA by PCR NEGATIVE NEGATIVE Final    Comment:        The GeneXpert MRSA Assay (FDA approved for NASAL specimens only), is one component of a comprehensive MRSA colonization surveillance program. It is not intended to diagnose MRSA infection nor to guide or monitor treatment  for MRSA infections. Performed at [facility], [place] [address] [place], [place] [address]           Radiology Studies: Dg Chest Port 1 View  Result Date: 10/18/2017 CLINICAL DATA:  Pneumonia EXAM: PORTABLE CHEST 1 VIEW COMPARISON:  10/16/2017 FINDINGS: Right Port-A-Cath remains in place, unchanged. Diffuse bilateral nodular opacities and airspace disease again noted, unchanged. Heart is normal size. Small left effusion suspected. No acute bony abnormality. IMPRESSION: Diffuse bilateral nodular opacities and airspace disease with small left effusion. No change. Electronically Signed   By: Rolm Baptise M.D.   On: 10/18/2017 10:29   Dg Chest Port 1 View  Result Date: 10/16/2017 CLINICAL DATA:  Cough and weakness EXAM: PORTABLE CHEST 1 VIEW COMPARISON:  10/16/2017 FINDINGS: Cardiac shadow is mildly enlarged but accentuated by the portable technique. Right chest wall port is again seen. Combination of nodular densities and infiltrative densities are again identified bilaterally stable from the prior exam. Left pleural effusion is again seen. No acute bony abnormality is noted. IMPRESSION: Stable appearance of the chest from the previous day. Electronically Signed   By: Inez Catalina M.D.   On: 10/16/2017 15:58        Scheduled Meds: . atenolol  50 mg Oral Daily  . chlorpheniramine-HYDROcodone  5 mL Oral Once  . loratadine  10 mg Oral Daily  . LORazepam  0.5 mg Oral Q8H  . methylPREDNISolone (SOLU-MEDROL) injection  60 mg Intravenous Q24H  . Tbo-filgastrim (GRANIX) SQ  480 mcg Subcutaneous q1800   Continuous Infusions: . ceFEPime (MAXIPIME) IV Stopped (10/18/17 0517)  . vancomycin       LOS: 5 days    Time spent: 35 minutes.     Hosie Poisson, MD [facility] Pager [phone]  If 7PM-7AM, please contact night-coverage [email or web address] Password [facility] 10/18/2017, 12:04 PM

## 2017-10-18 NOTE — Care Management Note (Signed)
Case Management Note  Patient Details  Name: [name] MRN: [id number] Date of Birth: 1961/01/11  Subjective/Objective:                  Pneumonia and fever=102, hr=113,resp=38./hfnc/iv solumedrol and nebs, iv maxipime and iv vancomycin/ wbc-up to 4.7 from 1.2/hgb-7.9  Action/Plan: Will follow for progression of care and clinical status. Will follow for case management needs none present at this time.  Expected Discharge Date:  10/15/17               Expected Discharge Plan:  Home/Self Care  In-House Referral:     Discharge planning Services  CM Consult  Post Acute Care Choice:    Choice offered to:     DME Arranged:    DME Agency:     HH Arranged:    HH Agency:     Status of Service:  In process, will continue to follow  If discussed at Long Length of Stay Meetings, dates discussed:    Additional Comments:  Leeroy Cha, RN 10/18/2017, 9:00 AM

## 2017-10-18 NOTE — Progress Notes (Signed)
NAME:  [name], MRN:  [id number], DOB:  17-Nov-1960, LOS: 5 ADMISSION DATE:  10/13/2017, CONSULTATION DATE:  9/25 REFERRING MD:  Karleen Hampshire , CHIEF COMPLAINT:  Acute hypoxic respiratory failure    Brief History   57 year old male patient with T-cell lymphoma with metastasis to the lung currently undergoing chemotherapy last completed on 9/9 with Gemzar and cisplatin.  Admitted on 9/22 with neutropenic fever, started on broad-spectrum antibiotics. Critical care consulted 9/25 for acute on chronic hypoxic respiratory failure  Significant Hospital Events    9/23: Infectious disease consulted.  Had developed mild worsening and chronic cough and shortness of breath recommended to continue vancomycin and cefepime possible pneumonia.  Was given Granix per hematology with plans to continue this until [place] was over 1000.  Chest x-ray with diffuse bilateral pulmonary infiltrates chronic in nature since late Aug 2019, 9/24: Having intermittent fever, cough a little bit worse.  One episode of nausea vomiting after forceful cough.  Room air oxygenation at 77% 9/25: Increased tachypnea.  Fluid challenge administered earlier.  Given IV Lasix.  Chest x-ray obtained showed progressive left lower lobe airspace disease, and persistent bilateral patchy airspace disease, oxygen requirements up to 7 L, CT chest negative for pulmonary emboli.  Moved to stepdown unit, trickle care asked to see for acute on chronic respiratory failure.  9/26: feels better. Still on high FIO2 cough and fever improved. Over all looking better. Neg almost 2 liters over 24 hrs but still > 4 liters +  9/27 still febrile still w/ high FIO2 needs  Consults: date of consult/date signed off & final recs:  Pulmonary consulted 9/25  Procedures (surgical and bedside):    Significant Diagnostic Tests:  Echocardiogram from 8/31: Ejection fraction 55 to 60% wall Motion was normal diastolic function normal wall CT angiogram 9/25: Negative for  pulmonary edema.  Increased mediastinal adenopathy, hilar adenopathy, and central groundglass opacities.  Also has left pleural effusion.  This looks small to moderate in size.  Pulmonary masses and right axillary adenopathy are stable.  Increased consolidation left lower lobe.  Micro Data: Respiratory virus panel 9/24: Negative Blood cultures obtained 9/23:>>> Urine culture 9/22: Negative  Antimicrobials:  Aztreonam 9/22 through 9/23 Cefepime 9/23>>> Metronidazole 9/22 through 9/23 Vancomycin 9/22>>>  Subjective:  Feels better   Objective   Blood pressure 112/67, pulse (Abnormal) 108, temperature (Abnormal) 96.4 F (35.8 C), resp. rate (Abnormal) 25, height 5\' 8"  (1.727 m), weight 78.3 kg, SpO2 95 %.        Intake/Output Summary (Last 24 hours) at 10/18/2017 1020 Last data filed at 10/18/2017 4128 Gross per 24 hour  Intake 528.47 ml  Output 2600 ml  Net -2071.53 ml   Filed Weights   10/13/17 1907 10/13/17 2121  Weight: 79.4 kg 78.3 kg    Examination: General 57yom NAD Hent: NCAT no JVD pulm crackles right base, decreased left Card RRR abd soft not tender  gu voids  Neuro: intact   Resolved Hospital Problem list     Assessment & Plan:   Acute on chronic hypoxic respiratory failure in the setting of possible pneumonia, superimposed on underlying pulmonary metastasis and possibly complicated by element of volume overload (which is almost certainly the reason for transfer) Portable chest x-ray personally reviewed: diffuse pulm infiltrates w/ basilar airspace disease on left. Aeration a little better.  Although the initial decompensation was likely volume related we have diuresed him to the point that his cr bumped.  Concerned that this could all be malignancy related.  Plan Day # 5 vanc and zosyn (defer to ID) Change BD to PRN Steroids thru the weekend Wean O2 If oxygen requirements don't improve over weekend and counts improve I wonder if we need to consider this  primary    Pancytopenia. W/ febrile neutropenia  Last chemotherapy approximately 2 weeks ago.  Granix started on 9/23 -Hemoglobin stable, platelet counts improving, white blood cell count slowly improving with as absolute neutrophils slowly improving as well Plan Cont granix until [place] normalizes Trend CMP  T-cell lymphoma with metastasis to lung Plan Per onc.  Cont steroids   History of superficial DVT Had been on Xarelto since 2018 Plan Defer ac to heme/ could probably just go on LMWH prophylaxis dosing    Disposition / Summary of Today's Plan 10/18/17   57 year old male patient he still lymphoma presents with neutropenic fever, plus/minus pneumonia.  Received several fluid boluses over the course of the evening resulting in acute respiratory distress.  Cont lasix as BUN/cr allow. abx per ID. Will sign off again     Diet: Heart healthy Pain/Anxiety/Delirium protocol (if indicated): Not indicated VAP protocol (if indicated): Not indicated DVT prophylaxis: Has been on hold due to thrombocytopenia was on Xarelto previously for superficial vein thrombosis, platelets have been stable/improving will start low molecular weight heparin at prophylaxis dosing 9/25 GI prophylaxis: Not indicated Hyperglycemia protocol: Not indicated Mobility: Bedrest Code Status: Full code Family Communication: Family updated  Labs   CBC: Recent Labs  Lab 10/13/17 1605 10/14/17 0338 10/15/17 0420 10/16/17 0349 10/17/17 0602 10/18/17 0634  WBC 0.3* 0.2* 0.5* 0.5* 1.2* 4.7  NEUTROABS 0.1*  --  0.3 0.3 0.8 4.0  HGB 8.7* 8.1* 8.2* 8.2* 7.5* 7.9*  HCT 25.4* 23.2* 23.7* 23.7* 21.5* 22.6*  MCV 92.0 93.5 92.9 93.3 92.3 91.9  PLT 40* 38* 45* 51* 64* 69*    Basic Metabolic Panel: Recent Labs  Lab 10/13/17 2200 10/14/17 0338 10/15/17 0420 10/16/17 0349 10/17/17 0602 10/18/17 0634  NA  --  139 143 142 141 141  K  --  3.7 4.1 3.8 3.6 3.7  CL  --  105 112* 111 106 104  CO2  --  26 25 24 26 26     GLUCOSE  --  106* 123* 137* 125* 167*  BUN  --  16 13 12  22* 34*  CREATININE  --  0.96 0.83 0.83 0.77 1.13  CALCIUM  --  7.8* 7.7* 8.0* 8.3* 8.4*  MG 1.8  --  1.8 1.8 1.7 1.8  PHOS 2.9  --   --   --   --   --    GFR: Estimated Creatinine Clearance: 69.8 mL/min (by C-G formula based on SCr of 1.13 mg/dL). Recent Labs  Lab 10/13/17 1654 10/13/17 1818  10/15/17 0420 10/16/17 0349 10/17/17 0602 10/17/17 1436 10/18/17 0634  PROCALCITON  --   --   --   --   --   --  0.36 0.47  WBC  --   --    < > 0.5* 0.5* 1.2*  --  4.7  LATICACIDVEN 1.99* 1.05  --   --   --   --   --   --    < > = values in this interval not displayed.    Liver Function Tests: Recent Labs  Lab 10/13/17 1605  AST 29  ALT 21  ALKPHOS 30*  BILITOT 1.1  PROT 5.8*  ALBUMIN 2.8*   No results for input(s): LIPASE, AMYLASE in the last 168  hours. No results for input(s): AMMONIA in the last 168 hours.  ABG    Component Value Date/Time   PHART 7.418 10/16/2017 0802   PCO2ART 34.8 10/16/2017 0802   PO2ART 36.3 (LL) 10/16/2017 0802   HCO3 22.0 10/16/2017 0802   ACIDBASEDEF 1.5 10/16/2017 0802   O2SAT 62.9 10/16/2017 0802     Coagulation Profile: Recent Labs  Lab 10/13/17 1605  INR 1.15    Cardiac Enzymes: No results for input(s): CKTOTAL, CKMB, CKMBINDEX, TROPONINI in the last 168 hours.  HbA1C: No results found for: HGBA1C  CBG: No results for input(s): GLUCAP in the last 168 hours.     Erick Colace ACNP-BC [place] Pager # [phone] OR # [phone] if no answer

## 2017-10-19 DIAGNOSIS — C8592 Non-Hodgkin lymphoma, unspecified, intrathoracic lymph nodes: Secondary | ICD-10-CM

## 2017-10-19 LAB — CULTURE, BLOOD (ROUTINE X 2): Culture: NO GROWTH

## 2017-10-19 LAB — MAGNESIUM: Magnesium: 1.8 mg/dL (ref 1.7–2.4)

## 2017-10-19 LAB — BASIC METABOLIC PANEL
Anion gap: 11 (ref 5–15)
BUN: 37 mg/dL — ABNORMAL HIGH (ref 6–20)
CALCIUM: 8.6 mg/dL — AB (ref 8.9–10.3)
CO2: 26 mmol/L (ref 22–32)
Chloride: 102 mmol/L (ref 98–111)
Creatinine, Ser: 1.03 mg/dL (ref 0.61–1.24)
Glucose, Bld: 136 mg/dL — ABNORMAL HIGH (ref 70–99)
POTASSIUM: 3.4 mmol/L — AB (ref 3.5–5.1)
SODIUM: 139 mmol/L (ref 135–145)

## 2017-10-19 LAB — PROCALCITONIN: PROCALCITONIN: 0.7 ng/mL

## 2017-10-19 MED ORDER — METHYLPREDNISOLONE SODIUM SUCC 125 MG IJ SOLR
125.0000 mg | Freq: Four times a day (QID) | INTRAMUSCULAR | Status: DC
  Administered 2017-10-19 – 2017-10-21 (×8): 125 mg via INTRAVENOUS
  Filled 2017-10-19 (×8): qty 2

## 2017-10-19 MED ORDER — FUROSEMIDE 10 MG/ML IJ SOLN
40.0000 mg | Freq: Every day | INTRAMUSCULAR | Status: DC
  Administered 2017-10-19 – 2017-10-20 (×2): 40 mg via INTRAVENOUS
  Filled 2017-10-19 (×2): qty 4

## 2017-10-19 MED ORDER — SODIUM CHLORIDE 0.9 % IV SOLN
INTRAVENOUS | Status: DC | PRN
  Administered 2017-10-19: 250 mL via INTRAVENOUS
  Administered 2017-10-21 – 2017-10-23 (×2): 500 mL via INTRAVENOUS

## 2017-10-19 MED ORDER — POTASSIUM CHLORIDE CRYS ER 20 MEQ PO TBCR
40.0000 meq | EXTENDED_RELEASE_TABLET | Freq: Once | ORAL | Status: AC
  Administered 2017-10-19: 40 meq via ORAL
  Filled 2017-10-19: qty 2

## 2017-10-19 MED ORDER — ORAL CARE MOUTH RINSE
15.0000 mL | Freq: Two times a day (BID) | OROMUCOSAL | Status: DC
  Administered 2017-10-19 – 2017-10-31 (×15): 15 mL via OROMUCOSAL

## 2017-10-19 MED ORDER — METHYLPREDNISOLONE SODIUM SUCC 125 MG IJ SOLR: 60.0000 mg | Freq: Four times a day (QID) | INTRAMUSCULAR | Status: DC

## 2017-10-19 MED ORDER — KETOROLAC TROMETHAMINE 30 MG/ML IJ SOLN
15.0000 mg | Freq: Three times a day (TID) | INTRAMUSCULAR | Status: DC | PRN
  Administered 2017-10-19 – 2017-10-20 (×2): 15 mg via INTRAVENOUS
  Filled 2017-10-19 (×2): qty 1

## 2017-10-19 NOTE — Progress Notes (Signed)
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 Apparently for the past week or so patient has been having off-and-on fevers at home therefore was started on Levaquin about a week ago but despite of this continued to have fever. His last chemo cycle was about 2 weeks ago- gemcitabine and cisplatin. Was also noted to be neutropenic therefore admitted to the hospital for neutropenic fever. Pancultures were performed and he was started on vancomycin, aztreonam and Flagyl. Oncology and infectious disease were consulted.  He was recommended to continue same antibiotics except discontinuing Flagyl.  Granix was started. His counts have started improving. He was transferred to step down for acute respiratory failure with hypoxia.   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  He was transferred to step down for acute respiratory failure.  CT angio of the chest negative for PE. He was started on IV lasix 40 mg BID  And diuresed .  He was also started on IV steroids as per oncology recommendations. Even after diuresis of 5 liters over the last 48 hours, pt required BIPAP overnight,.  He will probably need chemo therapy to optimize his respiratory status.  PCCM on board and assisting in  management.   Febrile neutropenia: sepsis ruled out. Counts improving on granix,ID recommending continuing IV antibiotics with vancomycin and cefepime.  Blood cultures negative so far. ID on board and assisting with management.    T cell Lymphoma with mets and h/o non Hodgkin's lymphoma   S/p  last chemo cycle was about 2 weeks ago- gemcitabine and cisplatin.  Dr Learta Codding is his primary oncologist. He will need chemotherapy.    H/o of superficial vein thrombosis:  Was on Xarelto, at home, which was held of thrombocytopenia.  When platelets normalize, we can restart xarelto.     Hypertension:  Well controlled on atenolol.    Anxiety:  Prn ativan.    Hypokalemia: Replaced.    DVT prophylaxis: heparin  Code Status: Full code.  Family Communication: discussed with wife and daughter  Disposition Plan: pending clinical improvement.   Consultants:   Oncology Dr Marin Olp  Pulmonology on call.    Procedures: CT angio of the chest .   Antimicrobials: Vancomycin and cefepime since 9/23.   Subjective: On BIPAP .  Objective: Vitals:   10/19/17 1042 10/19/17 1100 10/19/17 1101 10/19/17 1200  BP:  116/71  116/82  Pulse:  90 91 94  Resp:  (!) 33 (!) 49 (!) 36  Temp:      TempSrc:      SpO2: 91% (!) 82% 94% 92%  Weight:      Height:        Intake/Output Summary (Last 24 hours) at 10/19/2017 1317 Last data filed at 10/18/2017 2200 Gross per 24 hour  Intake 1657.84 ml  Output 600 ml  Net 1057.84 ml   Filed Weights   10/13/17 1907 10/13/17 2121  Weight: 79.4 kg 78.3 kg    Examination:  General exam: currently on BIPAP.  Respiratory system: coarse breath sounds bilateral . No wheezing heard.  Cardiovascular system: S1 & S2 heard, tachycardic, no JVD or murmer.  Gastrointestinal system: Abdomen is soft non tender non distended bowel sounds.  Central nervous system: Alert and oriented. Non focal.  Extremities: Symmetric 5 x 5 power. No pedal edema.  Skin: No  rashes, lesions or ulcers Psychiatry: mood appropriate.    Data Reviewed: I have personally reviewed following labs and imaging studies  CBC: Recent Labs  Lab 10/13/17 1605 10/14/17 0338 10/15/17 0420 10/16/17 0349 10/17/17 0602 10/18/17 0634  WBC 0.3* 0.2* 0.5* 0.5* 1.2* 4.7  NEUTROABS 0.1*  --  0.3 0.3 0.8 4.0  HGB 8.7* 8.1* 8.2* 8.2* 7.5* 7.9*  HCT 25.4* 23.2* 23.7* 23.7* 21.5* 22.6*  MCV 92.0 93.5 92.9 93.3 92.3 91.9  PLT 40* 38* 45* 51* 64* 69*   Basic Metabolic Panel: Recent Labs  Lab 10/13/17 2200  10/15/17 0420 10/16/17 0349 10/17/17 0602 10/18/17 0634 10/19/17 0500  NA  --    < > 143 142 141 141 139  K  --    < > 4.1 3.8 3.6 3.7 3.4*  CL  --    < > 112* 111 106 104 102  CO2  --    < > 25 24 26 26 26   GLUCOSE  --    < > 123* 137* 125* 167* 136*  BUN  --    < > 13 12 22* 34* 37*  CREATININE  --    < > 0.83 0.83 0.77 1.13 1.03  CALCIUM  --    < > 7.7* 8.0* 8.3* 8.4* 8.6*  MG 1.8  --  1.8 1.8 1.7 1.8 1.8  PHOS 2.9  --   --   --   --   --   --    < > = values in this interval not displayed.   GFR: Estimated Creatinine Clearance: 76.6 mL/min (by C-G formula based on SCr of 1.03 mg/dL). Liver Function Tests: Recent Labs  Lab 10/13/17 1605  AST 29  ALT 21  ALKPHOS 30*  BILITOT 1.1  PROT 5.8*  ALBUMIN 2.8*   No results for input(s): LIPASE, AMYLASE in the last 168 hours. No results for input(s): AMMONIA in the last 168 hours. Coagulation Profile: Recent Labs  Lab 10/13/17 1605  INR 1.15   Cardiac Enzymes: No results for input(s): CKTOTAL, CKMB, CKMBINDEX, TROPONINI in the last 168 hours. BNP (last 3 results) No results for input(s): PROBNP in the last 8760 hours. HbA1C: No results for input(s): HGBA1C in the last 72 hours. CBG: No results for input(s): GLUCAP in the last 168 hours. Lipid Profile: No results for input(s): CHOL, HDL, LDLCALC, TRIG, CHOLHDL, LDLDIRECT in the last 72 hours. Thyroid Function Tests: No results for input(s): TSH,  T4TOTAL, FREET4, T3FREE, THYROIDAB in the last 72 hours. Anemia Panel: No results for input(s): VITAMINB12, FOLATE, FERRITIN, TIBC, IRON, RETICCTPCT in the last 72 hours. Sepsis Labs: Recent Labs  Lab 10/13/17 1654 10/13/17 1818 10/17/17 1436 10/18/17 0634 10/19/17 0500  PROCALCITON  --   --  0.36 0.47 0.70  LATICACIDVEN 1.99* 1.05  --   --   --     Recent Results (from the past 240 hour(s))  Culture, blood (Routine x 2)     Status: None  Collection Time: 10/13/17  4:05 PM  Result Value Ref Range Status   Specimen Description   Final    PORTA CATH Performed at [facility], [place] [address], [place], [place] [address]    Special Requests   Final    BOTTLES DRAWN AEROBIC AND ANAEROBIC Blood Culture adequate volume Performed at [place] [address], [place], [place] [address]    Culture   Final    NO GROWTH 5 DAYS Performed at [facility], [place] [address] [place], [place] [address]    Report Status 10/18/2017 FINAL  Final  Urine Culture     Status: None   Collection Time: 10/13/17  6:29 PM  Result Value Ref Range Status   Specimen Description   Final    URINE, RANDOM Performed at [place] [address], [place], [place] [address]    Special Requests   Final    NONE Performed at [facility], [place] [address], [place], [place] [address]    Culture   Final    NO GROWTH Performed at [facility], [place] [address] [place], [place] [address]    Report Status 10/15/2017 FINAL  Final  Culture, blood (Routine x 2)     Status: None   Collection Time: 10/14/17  4:57 AM  Result Value Ref Range Status   Specimen Description   Final    BLOOD RIGHT HAND Performed at [place] [address], [place], [place] [address]    Special Requests   Final    BOTTLES DRAWN AEROBIC ONLY Blood Culture results may not be optimal due to an inadequate volume of blood  received in culture bottles Performed at [place] [address] [place], [place] [address]    Culture   Final    NO GROWTH 5 DAYS Performed at [facility], [place] [address], [place], [place] [address]    Report Status 10/19/2017 FINAL  Final  Respiratory Panel by PCR     Status: None   Collection Time: 10/15/17  3:03 PM  Result Value Ref Range Status   Adenovirus NOT DETECTED NOT DETECTED Final   Coronavirus 229E NOT DETECTED NOT DETECTED Final   Coronavirus HKU1 NOT DETECTED NOT DETECTED Final   Coronavirus NL63 NOT DETECTED NOT DETECTED Final   Coronavirus OC43 NOT DETECTED NOT DETECTED Final   Metapneumovirus NOT DETECTED NOT DETECTED Final   Rhinovirus / Enterovirus NOT DETECTED NOT DETECTED Final   Influenza A NOT DETECTED NOT DETECTED Final   Influenza B NOT DETECTED NOT DETECTED Final   Parainfluenza Virus 1 NOT DETECTED NOT DETECTED Final   Parainfluenza Virus 2 NOT DETECTED NOT DETECTED Final   Parainfluenza Virus 3 NOT DETECTED NOT DETECTED Final   Parainfluenza Virus 4 NOT DETECTED NOT DETECTED Final   Respiratory Syncytial Virus NOT DETECTED NOT DETECTED Final   Bordetella pertussis NOT DETECTED NOT DETECTED Final   Chlamydophila pneumoniae NOT DETECTED NOT DETECTED Final   Mycoplasma pneumoniae NOT DETECTED NOT DETECTED Final    Comment: Performed at [facility], [place] [address] [place],  [address]  MRSA PCR Screening     Status: None   Collection Time: 10/16/17  9:20 AM  Result Value Ref Range Status   MRSA by PCR NEGATIVE NEGATIVE Final    Comment:        The GeneXpert MRSA Assay (FDA approved for NASAL specimens only),  is one component of a comprehensive MRSA colonization surveillance program. It is not intended to diagnose MRSA infection nor to guide or monitor treatment for MRSA infections. Performed at [facility], [place] [address], [place], [place] [address]          Radiology  Studies: Dg Chest Port 1 View  Result Date: 10/18/2017 CLINICAL DATA:  Pneumonia EXAM: PORTABLE CHEST 1 VIEW COMPARISON:  10/16/2017 FINDINGS: Right Port-A-Cath remains in place, unchanged. Diffuse bilateral nodular opacities and airspace disease again noted, unchanged. Heart is normal size. Small left effusion suspected. No acute bony abnormality. IMPRESSION: Diffuse bilateral nodular opacities and airspace disease with small left effusion. No change. Electronically Signed   By: Rolm Baptise M.D.   On: 10/18/2017 10:29        Scheduled Meds: . atenolol  50 mg Oral Daily  . chlorpheniramine-HYDROcodone  5 mL Oral Once  . furosemide  40 mg Intravenous Daily  . loratadine  10 mg Oral Daily  . LORazepam  0.5 mg Oral Q8H  . mouth rinse  15 mL Mouth Rinse BID  . methylPREDNISolone (SOLU-MEDROL) injection  125 mg Intravenous Q6H   Continuous Infusions: . sodium chloride 250 mL (10/19/17 1219)  . ceFEPime (MAXIPIME) IV 2 g (10/19/17 1220)  . vancomycin 750 mg (10/19/17 1309)     LOS: 6 days    Time spent: 35 minutes.     Hosie Poisson, MD [facility] Pager [phone]  If 7PM-7AM, please contact night-coverage [email or web address] Password [email or web address] 10/19/2017, 1:17 PM

## 2017-10-19 NOTE — Progress Notes (Signed)
Removed PT from BiPAP and placed on heated high flow nasal cannula per PT request- RN aware. Currently, HHFNC 90% with 40 lpm 02- with Sp02 91% (SP02 MD goal on HHFNC >=90%).

## 2017-10-19 NOTE — Progress Notes (Signed)
Patient given PRN tylenol for low-grade fever of 100. Patient's daughter (who she states is an Therapist, sports) stated that patient needs "toradol for fever" and does not wish that the patient receives ibuprofen because "that does not work for him." MD suggested to administer ibuprofen prior to toradol but patient's daughter became upset and demanded to speak to the doctor. MD once again made aware and new orders received. Toradol given.  Will continue to monitor.

## 2017-10-19 NOTE — Progress Notes (Addendum)
[facility] for Infectious Disease    Date of Admission:  10/13/2017   Total days of antibiotics 7        Day 7 cefepime        Day 7 vancomycin           ID: [name] is a 57 y.o. 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 He remains severely hypoxic, requiring bipap overnight but this morning/afternoon on high flow oxygen, feels improved with increased dose of steroids. Having dry cough.  Medications:  . atenolol  50 mg Oral Daily  . chlorpheniramine-HYDROcodone  5 mL Oral Once  . furosemide  40 mg Intravenous Daily  . loratadine  10 mg Oral Daily  . LORazepam  0.5 mg Oral Q8H  . mouth rinse  15 mL Mouth Rinse BID  . methylPREDNISolone (SOLU-MEDROL) injection  125 mg Intravenous Q6H    Objective: Vital signs in last 24 hours: Temp:  [99.5 F (37.5 C)-100 F (37.8 C)] 100 F (37.8 C) (09/28 1400) Pulse Rate:  [85-98] 98 (09/28 1400) Resp:  [26-49] 39 (09/28 1400) BP: (109-132)/(52-84) 119/61 (09/28 1400) SpO2:  [82 %-95 %] 93 % (09/28 1400) FiO2 (%):  [70 %-90 %] 90 % (09/28 1351) Physical Exam  Constitutional: He is oriented to person, place, and time. He appears well-developed and well-nourished. No distress.  HENT:  Mouth/Throat: Oropharynx is clear and moist. No oropharyngeal exudate.  Neck: mild acc. m use. Cardiovascular: Normal rate, regular rhythm and normal heart sounds. Exam reveals no gallop and no friction rub.  No murmur heard.  Pulmonary/Chest: Effort normal and breath sounds normal. No respiratory distress. He has no wheezes.  Abdominal: Soft. Bowel sounds are normal. He exhibits no distension. There is no  tenderness.  Neurological: He is alert and oriented to person, place, and time.  Skin: Skin is warm and dry. No rash noted. No erythema.  Ext" trace edema Psychiatric: He has a normal mood and affect. His behavior is normal.      Lab Results Recent Labs    10/17/17 0602 10/18/17 0634 10/19/17 0500  WBC 1.2* 4.7  --   HGB 7.5* 7.9*  --   HCT 21.5* 22.6*  --   NA 141 141 139  K 3.6 3.7 3.4*  CL 106 104 102  CO2 26 26 26   BUN 22* 34* 37*  CREATININE 0.77 1.13 1.03    Microbiology: reviewed Studies/Results: Dg Chest Port 1 View  Result Date: 10/18/2017 CLINICAL DATA:  Pneumonia EXAM: PORTABLE CHEST 1 VIEW COMPARISON:  10/16/2017 FINDINGS: Right Port-A-Cath remains in place, unchanged. Diffuse bilateral nodular opacities and airspace disease again noted, unchanged. Heart is normal size. Small left effusion suspected. No acute bony abnormality. IMPRESSION: Diffuse bilateral nodular opacities and airspace disease with small left effusion. No change. Electronically Signed   By: Rolm Baptise M.D.   On: 10/18/2017 10:29     Assessment/Plan: FN with pneumonia vs. Worsening lymphoma to lungs - steroid dosing increased per dr Martha Clan. [place] yesterday improved up to 4K since receiving GCSF. Today he feels improved with steroids though this may be blunting fevers. If  he clinically worsens- requiring intubation or has return of high fever, would recommend to start voriconazole.  Will check for aspergillus ab /not sure if much utility for b-d glucan/fungitell since can be falsely positive with cefepime exposure and delayed turnaround time.  For now keep on vancomycin and cefepime  Lymphoma - scheduled to be back on chemo on Monday.  [facility] Cell: [phone] Pager: [phone]  10/19/2017, 2:22 PM

## 2017-10-19 NOTE — Progress Notes (Signed)
Pt. Continues to have low saturations despite adjustments in Fio2 and Flow on HHFNC.  Spoke to patient and wife regarding use of Bipap and both are agreeable, will place on Bipap at this time.

## 2017-10-19 NOTE — Progress Notes (Signed)
I went down to see [name].  He has been quite a while since I am last saw him.  I think I saw him a couple years ago when he was getting chemotherapy up on the floor.  Clearly, he is having difficulties.  I have to agree with the other physicians and that this really represents his lymphoma progressing rather quickly.  I would not think that this is anything infectious.  I looked at the CT angiogram that he had done.  He definitely looks as if his lymphoma is worsening.  Particularly, the infiltrate in the left lower lobe.  The issue is that he is trying to get onto a CAR-T trial at [facility].  This would clearly be his best chance for long-term remission.  We have to do what we need to in order to get him into this trial.  CAR-T therapy is really the forefront of immune no therapy for lymphoma and represents a significant advancement into curative therapy for diseases that have notoriously been tough to treat when they recur.  I really believe that we have to embark upon another course of treatment for [name].  His family is in agreement.  I really think that a great option for him would be DHAP protocol.  He has never had Ara-C.  It also incorporates cis-platinum which I think has been effective for him.  I talked to he and his family about this.  Again, I think this would be a really good idea for him.  It obviously is incredibly myelosuppressive.  He would need to have a lot of support after treatment.  For right now, I would definitely increase his steroids.  I think this would be effective.  His labs do not look too bad.  His creatinine is 1.03.  His potassium is a little bit low at 3.4.  There is no CBC back yet today.  However, his platelet count was coming up a little bit.  I we will plan to have treatment started on Monday.  Hopefully, we can get him through the weekend with high-dose steroids being given.  I just want to give [name] a fighting chance to get to the CAR-T  trial.  This will be the ultimate therapy for him that will get him into remission.  I have spoken to the staff up on 6 E. so that they can have staff necessary to treat him in the ICU on Monday.  If, for some reason, he worsens over the next day, then I would get started with treatment tomorrow.  Lattie Haw, MD  [name] 1:5-7

## 2017-10-19 NOTE — Progress Notes (Signed)
57 year old man with lymphoma with lung involvement admitted 9/22 with neutropenic fever and hypoxic respiratory failure with CT angiogram showing increase adenopathy and groundglass infiltrates with left lower lobe consolidation He was treated with broad-spectrum antibiotics and given G-CSF He remained severely hypoxic and required high flow nasal cannula and BiPAP overnight for work of breathing.  On exam-patient comfortable on BiPAP when able to speak in full sentences through full facemask, mild accessory muscle use, hands look puffy, no pedal edema, bilateral scattered crackles, S1-S2 tacky. He continues to have low-grade fevers.  Labs show recovering WBC count and platelets, creatinine is trending back down, procalcitonin remains low positive.  Impression/plan   Acute hypoxemic respiratory failure Pneumonia in immunocompromised patient-continue empiric cefepime and vancomycin.  Concern for lymphoma involving the lung here, increase empiric steroid dose for lymphoma.  I doubt that this is pneumonitis related to chemotherapy.  Agree that for hypoxia to improve, he will need to be redosed with chemotherapy whenever counts permit. Prognosis guarded, hopefully we can avoid mechanical ventilation here Continue BiPAP as needed for work of breathing with breaks on high flow nasal cannula  Kara Mead MD. FCCP. [facility] Pager [phone] If no response call [phone]   10/19/2017

## 2017-10-20 ENCOUNTER — Inpatient Hospital Stay (HOSPITAL_COMMUNITY): Payer: 59

## 2017-10-20 DIAGNOSIS — C8440 Peripheral T-cell lymphoma, not classified, unspecified site: Secondary | ICD-10-CM

## 2017-10-20 LAB — CBC WITH DIFFERENTIAL/PLATELET
BASOS PCT: 0 %
Band Neutrophils: 6 %
Basophils Absolute: 0 10*3/uL (ref 0.0–0.1)
EOS ABS: 0 10*3/uL (ref 0.0–0.7)
EOS PCT: 0 %
HEMATOCRIT: 23.5 % — AB (ref 39.0–52.0)
Hemoglobin: 8.2 g/dL — ABNORMAL LOW (ref 13.0–17.0)
Lymphocytes Relative: 1 %
Lymphs Abs: 0.1 10*3/uL — ABNORMAL LOW (ref 0.7–4.0)
MCH: 32 pg (ref 26.0–34.0)
MCHC: 34.9 g/dL (ref 30.0–36.0)
MCV: 91.8 fL (ref 78.0–100.0)
MONO ABS: 0.3 10*3/uL (ref 0.1–1.0)
MONOS PCT: 2 %
MYELOCYTES: 1 %
Metamyelocytes Relative: 1 %
Neutro Abs: 14.3 10*3/uL — ABNORMAL HIGH (ref 1.7–7.7)
Neutrophils Relative %: 89 %
PLATELETS: 82 10*3/uL — AB (ref 150–400)
RBC: 2.56 MIL/uL — ABNORMAL LOW (ref 4.22–5.81)
RDW: 14.7 % (ref 11.5–15.5)
WBC: 14.7 10*3/uL — AB (ref 4.0–10.5)
nRBC: 1 /100 WBC — ABNORMAL HIGH

## 2017-10-20 LAB — COMPREHENSIVE METABOLIC PANEL
ALT: 32 U/L (ref 0–44)
ANION GAP: 11 (ref 5–15)
AST: 25 U/L (ref 15–41)
Albumin: 2.2 g/dL — ABNORMAL LOW (ref 3.5–5.0)
Alkaline Phosphatase: 48 U/L (ref 38–126)
BILIRUBIN TOTAL: 0.7 mg/dL (ref 0.3–1.2)
BUN: 48 mg/dL — AB (ref 6–20)
CHLORIDE: 104 mmol/L (ref 98–111)
CO2: 26 mmol/L (ref 22–32)
Calcium: 8.9 mg/dL (ref 8.9–10.3)
Creatinine, Ser: 1.1 mg/dL (ref 0.61–1.24)
GFR calc Af Amer: >60 mL/min (ref >60)
GFR calc non Af Amer: >60 mL/min (ref >60)
GLUCOSE: 208 mg/dL — AB (ref 70–99)
Potassium: 3.8 mmol/L (ref 3.5–5.1)
SODIUM: 141 mmol/L (ref 135–145)
TOTAL PROTEIN: 5.3 g/dL — AB (ref 6.5–8.1)

## 2017-10-20 LAB — GLUCOSE, CAPILLARY
GLUCOSE-CAPILLARY: 261 mg/dL — AB (ref 70–99)
Glucose-Capillary: 260 mg/dL — ABNORMAL HIGH (ref 70–99)
Glucose-Capillary: 335 mg/dL — ABNORMAL HIGH (ref 70–99)

## 2017-10-20 MED ORDER — INSULIN ASPART 100 UNIT/ML ~~LOC~~ SOLN
0.0000 [IU] | SUBCUTANEOUS | Status: DC
  Administered 2017-10-20: 7 [IU] via SUBCUTANEOUS
  Administered 2017-10-21: 3 [IU] via SUBCUTANEOUS
  Administered 2017-10-21: 5 [IU] via SUBCUTANEOUS
  Administered 2017-10-22: 3 [IU] via SUBCUTANEOUS
  Administered 2017-10-22: 7 [IU] via SUBCUTANEOUS
  Administered 2017-10-22 (×2): 5 [IU] via SUBCUTANEOUS
  Administered 2017-10-22: 2 [IU] via SUBCUTANEOUS
  Administered 2017-10-23: 3 [IU] via SUBCUTANEOUS
  Administered 2017-10-24: 7 [IU] via SUBCUTANEOUS
  Administered 2017-10-24 – 2017-10-25 (×4): 5 [IU] via SUBCUTANEOUS
  Administered 2017-10-26 (×3): 3 [IU] via SUBCUTANEOUS
  Administered 2017-10-27: 5 [IU] via SUBCUTANEOUS
  Administered 2017-10-27: 3 [IU] via SUBCUTANEOUS

## 2017-10-20 NOTE — Progress Notes (Signed)
57 year old man with lymphoma with lung involvement admitted 9/22 with neutropenic fever and hypoxic respiratory failure with CT angiogram showing increase adenopathy and groundglass infiltrates with left lower lobe consolidation  COURSE - He was treated with broad-spectrum antibiotics and given G-CSF.  Breathing and hypoxia initially improved with diuresis but has worsened again, requiring high flow oxygen &  intermittent NIV.  Counts are recovering  Febrile over last 24 hours. On exam-on BiPAP, full facemask, accessory muscle use, able to speak in full sentences, 1+ bipedal edema, bilateral scattered crackles, S1-S2 tacky.  Chest x-ray personally reviewed showing diffuse bilateral nodular infiltrates and airspace disease. Labs show WBC rising and platelets, creatinine stable at 1.1.  Impression/plan  Acute hypoxic respiratory failure -okay to continue noninvasive ventilation with breaks on high flow nasal cannula. We will keep on clear liquid diet. Continue Lasix 40 every 24. Concern that this is lymphoma involving the lung and only way this will possibly improve his with chemotherapy  Pneumonia in immunocompromised patient-continue empiric cefepime and vancomycin  Steroid-induced hyperglycemia-monitor, SSI Prognosis guarded unless lymphoma response to treatment, continue high-dose steroids in the meantime  Kara Mead MD. FCCP. [facility] Pager [phone] If no response call [phone]   10/20/2017

## 2017-10-20 NOTE — Progress Notes (Signed)
[facility] for Infectious Disease    Date of Admission:  10/13/2017   Total days of antibiotics 8        Day 8 cefepime        Day 8 vancomycin           ID: [name] is a 57 y.o. 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 Family reports he is feeling better than yesterday- thought to be due to steroids  Medications:  . atenolol  50 mg Oral Daily  . chlorpheniramine-HYDROcodone  5 mL Oral Once  . furosemide  40 mg Intravenous Daily  . insulin aspart  0-9 Units Subcutaneous Q4H  . loratadine  10 mg Oral Daily  . LORazepam  0.5 mg Oral Q8H  . mouth rinse  15 mL Mouth Rinse BID  . methylPREDNISolone (SOLU-MEDROL) injection  125 mg Intravenous Q6H    Objective: Vital signs in last 24 hours: Temp:  [97.9 F (36.6 C)-99.3 F (37.4 C)] 99.3 F (37.4 C) (09/29 1700) Pulse Rate:  [79-102] 90 (09/29 1700) Resp:  [20-40] 33 (09/29 1700) BP: (106-149)/(54-90) 107/54 (09/29 1700) SpO2:  [86 %-93 %] 86 % (09/29 1700) FiO2 (%):  [75 %-90 %] 90 % (09/29 1353) Physical Exam  Constitutional: He is oriented to person, place, and time. He appears well-developed and well-nourished. Appears fatigued Mouth/Throat: Oropharynx is clear and moist. No oropharyngeal exudate.  Neck: mild acc. m use. Cardiovascular: Normal rate, regular rhythm and normal heart sounds. Exam reveals no gallop and no friction rub.  No murmur heard.  Chest wall= port without any signs of erythema or tenderness Pulmonary/Chest: Effort normal and breath sounds normal. No respiratory distress. He has no wheezes.  Abdominal: Soft. Bowel sounds are  normal. He exhibits no distension. There is no tenderness.  Neurological: He is alert and oriented to person, place, and time.  Skin: Skin is warm and dry. No rash noted. No erythema.  Ext" trace edema Psychiatric: He has a normal mood and affect. His behavior is normal.      Lab Results Recent Labs    10/18/17 0634 10/19/17 0500 10/20/17 0509  WBC 4.7  --  14.7*  HGB 7.9*  --  8.2*  HCT 22.6*  --  23.5*  NA 141 139 141  K 3.7 3.4* 3.8  CL 104 102 104  CO2 26 26 26   BUN 34* 37* 48*  CREATININE 1.13 1.03 1.10    Microbiology: reviewed Studies/Results: Dg Chest Port 1 View  Result Date: 10/20/2017 CLINICAL DATA:  Respiratory failure EXAM: PORTABLE CHEST 1 VIEW COMPARISON:  10/18/2017 FINDINGS: Port in the anterior chest wall with tip in distal SVC. Normal cardiac silhouette. Bilateral pulmonary nodules. Diffuse airspace disease. Findings correspond to CT 10/16/2017. Small to moderate LEFT effusion. IMPRESSION: 1. No interval change. 2. Bilateral pulmonary nodules on the background of diffuse airspace disease. Electronically Signed   By: Suzy Bouchard M.D.   On: 10/20/2017 07:47     Assessment/Plan: FN with pneumonia vs. Worsening lymphoma to lungs - steroid dosing increased per dr Martha Clan. Steroids also improving WBC.  he feels  improved with steroids though this may be blunting fevers -afebrile now x 36hr.  Will check for aspergillus ab /not sure if much utility for b-d glucan/fungitell since can be falsely positive with cefepime exposure and delayed turnaround time.  For now keep on vancomycin and cefepime- if still afebrile tomorrow, consider stopping.  Lymphoma - scheduled to be back on chemo on Monday.   Dr Megan Salon to see in the am.  [facility] for Infectious Diseases Cell: [phone] Pager: [phone]  10/20/2017, 5:38 PM

## 2017-10-20 NOTE — Progress Notes (Signed)
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 Apparently for the past week or so patient has been having off-and-on fevers at home therefore was started on Levaquin about a week ago but despite of this continued to have fever. His last chemo cycle was about 2 weeks ago- gemcitabine and cisplatin. Was also noted to be neutropenic therefore admitted to the hospital for neutropenic fever. Pancultures were performed and he was started on vancomycin, aztreonam and Flagyl. Oncology and infectious disease were consulted.  He was recommended to continue same antibiotics except discontinuing Flagyl.  Granix was started. His counts have started improving. He was transferred to step down for acute respiratory failure with hypoxia.   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  He was transferred to step down for acute respiratory failure.  CT angio of the chest negative for PE. He was started on IV lasix 40 mg BID  And diuresed appropriately, currently on daily lasix.  He was also started on IV steroids as per oncology recommendations. Pt currently requiring BIPAP at night and during the day, he is on high flow oxygen 40% with sats in low 90%.  He will probably need chemo therapy to  optimize his respiratory status.  PCCM on board and assisting in management.  Dr Learta Codding to consult the lymphoma service at [facility] to decide on the next therapy regimen.  ID on board to assist with antibiotic duration. Currently he is on day 7 of broad spectrum IV antibiotics.  So far his cultures have been negative. But he continues to have low grade fevers, suspect its from lymphoma.   Febrile neutropenia: sepsis ruled out. Counts improving on granix,ID recommending continuing IV antibiotics with vancomycin and cefepime ( day 7) .  Blood cultures negative so far. ID on board and assisting with management.  Continues to have fevers , suspect from lymphoma.    T cell Lymphoma with mets and h/o non Hodgkin's lymphoma   S/p  last chemo cycle was about 2 weeks ago- gemcitabine and cisplatin.  Dr Learta Codding is his primary oncologist. He will need chemotherapy.    H/o of superficial vein thrombosis:  Was on Xarelto, at home, which was held of thrombocytopenia.  When platelets normalize, we can restart xarelto.     Hypertension:  Well controlled on atenolol.    Anxiety:  Prn ativan.    Hypokalemia: Replaced.    Hyperglycemia;  From high dose steroids.  Family wants to hold off on SSI till tomorrow, or if Dr Learta Codding is okay with it.    Anemia and thrombocytopenia:  Improving with granix.   DVT prophylaxis: heparin  Code Status: Full code.  Family Communication: discussed with wife  Disposition Plan: pending clinical improvement. Possible chemo on MOnday.   Consultants:   Oncology Dr Marin Olp  Pulmonology on  call.    Procedures: CT angio of the chest .   Antimicrobials: Vancomycin and cefepime since 9/23.   Subjective: On high flow oxygen. Still uncomfortable   Objective: Vitals:   10/20/17 0800 10/20/17 0850 10/20/17 0900 10/20/17 1000  BP: 134/75 134/75 138/69 (!) 149/82  Pulse: 84 94 88 (!) 102  Resp: (!) 28 (!) 24 20 (!) 40  Temp: 98.4 F (36.9 C)  98.6 F (37  C) 98.8 F (37.1 C)  TempSrc:      SpO2: 93% 91% (!) 89% (!) 89%  Weight:      Height:        Intake/Output Summary (Last 24 hours) at 10/20/2017 1050 Last data filed at 10/20/2017 1047 Gross per 24 hour  Intake 734.25 ml  Output 2550 ml  Net -1815.75 ml   Filed Weights   10/13/17 1907 10/13/17 2121  Weight: 79.4 kg 78.3 kg    Examination:  General exam: on high flow oxygen in mild distress from tachypnea.  Respiratory system: air entry fair, no wheezing or rhonchi. bil coarse breath sounds.  Cardiovascular system: S1 & S2 heard, tachycardic, no JVD or murmer.  Gastrointestinal system: Abdomen is soft NT nd bs+ Central nervous system: Alert and oriented. Non focal.  Extremities: Symmetric 5 x 5 power. No pedal edema.  Skin: No rashes, lesions or ulcers Psychiatry: mood appropriate.    Data Reviewed: I have personally reviewed following labs and imaging studies  CBC: Recent Labs  Lab 10/15/17 0420 10/16/17 0349 10/17/17 0602 10/18/17 0634 10/20/17 0509  WBC 0.5* 0.5* 1.2* 4.7 14.7*  NEUTROABS 0.3 0.3 0.8 4.0 14.3*  HGB 8.2* 8.2* 7.5* 7.9* 8.2*  HCT 23.7* 23.7* 21.5* 22.6* 23.5*  MCV 92.9 93.3 92.3 91.9 91.8  PLT 45* 51* 64* 69* 82*   Basic Metabolic Panel: Recent Labs  Lab 10/13/17 2200  10/15/17 0420 10/16/17 0349 10/17/17 0602 10/18/17 0634 10/19/17 0500 10/20/17 0509  NA  --    < > 143 142 141 141 139 141  K  --    < > 4.1 3.8 3.6 3.7 3.4* 3.8  CL  --    < > 112* 111 106 104 102 104  CO2  --    < > 25 24 26 26 26 26   GLUCOSE  --    < > 123* 137* 125* 167* 136* 208*  BUN  --    < > 13 12 22* 34* 37* 48*  CREATININE  --    < > 0.83 0.83 0.77 1.13 1.03 1.10  CALCIUM  --    < > 7.7* 8.0* 8.3* 8.4* 8.6* 8.9  MG 1.8  --  1.8 1.8 1.7 1.8 1.8  --   PHOS 2.9  --   --   --   --   --   --   --    < > = values in this interval not displayed.   GFR: Estimated Creatinine Clearance: 71.7 mL/min (by C-G formula based on SCr of 1.1 mg/dL). Liver Function  Tests: Recent Labs  Lab 10/13/17 1605 10/20/17 0509  AST 29 25  ALT 21 32  ALKPHOS 30* 48  BILITOT 1.1 0.7  PROT 5.8* 5.3*  ALBUMIN 2.8* 2.2*   No results for input(s): LIPASE, AMYLASE in the last 168 hours. No results for input(s): AMMONIA in the last 168 hours. Coagulation Profile: Recent Labs  Lab 10/13/17 1605  INR 1.15   Cardiac Enzymes: No results for input(s): CKTOTAL, CKMB, CKMBINDEX, TROPONINI in  the last 168 hours. BNP (last 3 results) No results for input(s): PROBNP in the last 8760 hours. HbA1C: No results for input(s): HGBA1C in the last 72 hours. CBG: Recent Labs  Lab 10/20/17 1004  GLUCAP 261*   Lipid Profile: No results for input(s): CHOL, HDL, LDLCALC, TRIG, CHOLHDL, LDLDIRECT in the last 72 hours. Thyroid Function Tests: No results for input(s): TSH, T4TOTAL, FREET4, T3FREE, THYROIDAB in the last 72 hours. Anemia Panel: No results for input(s): VITAMINB12, FOLATE, FERRITIN, TIBC, IRON, RETICCTPCT in the last 72 hours. Sepsis Labs: Recent Labs  Lab 10/13/17 1654 10/13/17 1818 10/17/17 1436 10/18/17 0634 10/19/17 0500  PROCALCITON  --   --  0.36 0.47 0.70  LATICACIDVEN 1.99* 1.05  --   --   --     Recent Results (from the past 240 hour(s))  Culture, blood (Routine x 2)     Status: None   Collection Time: 10/13/17  4:05 PM  Result Value Ref Range Status   Specimen Description   Final    PORTA CATH Performed at [facility] [address] [place], [place] [address]    Special Requests   Final    BOTTLES DRAWN AEROBIC AND ANAEROBIC Blood Culture adequate volume Performed at [facility] [address] [place], [place] [address]    Culture   Final    NO GROWTH 5 DAYS Performed at [facility], [place] [address], [place], [place] [address]    Report Status 10/18/2017 FINAL  Final  Urine Culture     Status: None   Collection Time: 10/13/17  6:29 PM  Result Value Ref Range Status   Specimen  Description   Final    URINE, RANDOM Performed at [facility] [address], [place], [place] [address]    Special Requests   Final    NONE Performed at [facility], [place] [address] [place], [place] [address]    Culture   Final    NO GROWTH Performed at [facility], [place] [address], [place], [place] [address]    Report Status 10/15/2017 FINAL  Final  Culture, blood (Routine x 2)     Status: None   Collection Time: 10/14/17  4:57 AM  Result Value Ref Range Status   Specimen Description   Final    BLOOD RIGHT HAND Performed at [facility] [address], [place], [place] [address]    Special Requests   Final    BOTTLES DRAWN AEROBIC ONLY Blood Culture results may not be optimal due to an inadequate volume of blood received in culture bottles Performed at [facility] [address], [place], [place] [address]    Culture   Final    NO GROWTH 5 DAYS Performed at [facility], [place] [address], [place], [place] [address]    Report Status 10/19/2017 FINAL  Final  Respiratory Panel by PCR     Status: None   Collection Time: 10/15/17  3:03 PM  Result Value Ref Range Status   Adenovirus NOT DETECTED NOT DETECTED Final   Coronavirus 229E NOT DETECTED NOT DETECTED Final   Coronavirus HKU1 NOT DETECTED NOT DETECTED Final   Coronavirus NL63 NOT DETECTED NOT DETECTED Final   Coronavirus OC43 NOT DETECTED NOT DETECTED Final   Metapneumovirus NOT DETECTED NOT DETECTED Final   Rhinovirus / Enterovirus NOT DETECTED NOT DETECTED Final   Influenza A NOT DETECTED NOT DETECTED Final  Influenza B NOT DETECTED NOT DETECTED Final   Parainfluenza Virus 1 NOT DETECTED NOT DETECTED Final   Parainfluenza Virus 2 NOT DETECTED NOT DETECTED Final   Parainfluenza Virus 3 NOT DETECTED NOT DETECTED Final   Parainfluenza Virus 4 NOT DETECTED NOT DETECTED Final   Respiratory Syncytial Virus NOT DETECTED NOT DETECTED  Final   Bordetella pertussis NOT DETECTED NOT DETECTED Final   Chlamydophila pneumoniae NOT DETECTED NOT DETECTED Final   Mycoplasma pneumoniae NOT DETECTED NOT DETECTED Final    Comment: Performed at [facility], [place] [address] [place], [place] [address]  MRSA PCR Screening     Status: None   Collection Time: 10/16/17  9:20 AM  Result Value Ref Range Status   MRSA by PCR NEGATIVE NEGATIVE Final    Comment:        The GeneXpert MRSA Assay (FDA approved for NASAL specimens only), is one component of a comprehensive MRSA colonization surveillance program. It is not intended to diagnose MRSA infection nor to guide or monitor treatment for MRSA infections. Performed at [facility], [place] [address], [place], [place] [address]          Radiology Studies: Dg Chest Port 1 View  Result Date: 10/20/2017 CLINICAL DATA:  Respiratory failure EXAM: PORTABLE CHEST 1 VIEW COMPARISON:  10/18/2017 FINDINGS: Port in the anterior chest wall with tip in distal SVC. Normal cardiac silhouette. Bilateral pulmonary nodules. Diffuse airspace disease. Findings correspond to CT 10/16/2017. Small to moderate LEFT effusion. IMPRESSION: 1. No interval change. 2. Bilateral pulmonary nodules on the background of diffuse airspace disease. Electronically Signed   By: Suzy Bouchard M.D.   On: 10/20/2017 07:47        Scheduled Meds: . atenolol  50 mg Oral Daily  . chlorpheniramine-HYDROcodone  5 mL Oral Once  . furosemide  40 mg Intravenous Daily  . insulin aspart  0-9 Units Subcutaneous Q4H  . loratadine  10 mg Oral Daily  . LORazepam  0.5 mg Oral Q8H  . mouth rinse  15 mL Mouth Rinse BID  . methylPREDNISolone (SOLU-MEDROL) injection  125 mg Intravenous Q6H   Continuous Infusions: . sodium chloride Stopped (10/19/17 1309)  . ceFEPime (MAXIPIME) IV Stopped (10/20/17 0532)  . vancomycin Stopped (10/20/17 0054)     LOS: 7 days    Time spent: 35 minutes.     Hosie Poisson, MD [facility] Pager [phone]  If 7PM-7AM, please contact night-coverage [email or web address] Password [email or web address] 10/20/2017, 10:50 AM

## 2017-10-20 NOTE — Progress Notes (Signed)
Removed PT from BiPAP (per his request)- and placed on heated high flow nasal cannula. Currently on 90% at 40 lpm with Sp02 of 91% (MD ordered Sp02 goal >=90%). RN aware.

## 2017-10-20 NOTE — Progress Notes (Addendum)
IP PROGRESS NOTE  Subjective:   [name] is well-known to me with a history of non-Hodgkin's lymphoma.  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  He was treated with a first cycle of gemcitabine/cisplatin on 09/30/2017.  The chemotherapy was complicated by severe pancytopenia.  He received a platelet transfusion 10/11/2017.  Day 8 chemotherapy was held.  He was placed on G-CSF beginning 10/14/2017.  [name] developed a fever prompting an emergency room visit 10/13/2017.  He was admitted for evaluation and management of febrile neutropenia.  Infectious disease was consulted and he was placed on broad-spectrum antibiotics.  He had persistent fever and developed respiratory failure after hospital admission.  A CT of the chest 10/16/2017 revealed progressive mediastinal and hilar adenopathy and a stable right axillary node.  No change in nodular opacities throughout both lungs with increased groundglass opacities and left lower lobe consolidation.  Increased pleural effusions.  Pulmonary medicine was consulted.  He developed progressive respiratory failure over the past several days requiring high level oxygen port including BiPAP.  He was placed on steroids.  The steroids were increased yesterday.  [name] reports feeling better over the past 2 days.  He reports sweats in addition to the fever.  His cough is nonproductive.  He has not noted a change in the right axillary mass.  Objective: Vital signs in last 24 hours: Blood pressure (!) 143/90, pulse 93, temperature 98.2 F (36.8 C), resp. rate (!) 25, height '5\' 8"'$  (1.727 m), weight 172 lb 9.6 oz (78.3 kg), SpO2 93 %.  Intake/Output from previous day: 09/28 0701 - 09/29 0700 In: 495.4 [I.V.:3.1; IV Piggyback:492.3] Out: 1800 [Urine:1800]  Physical Exam:  HEENT: BiPAP mask in place, no thrush Lungs: Inspiratory rales at the left lower chest, decreased breath sounds at the left lower chest Cardiac: Regular rate and  rhythm Abdomen: No hepatosplenomegaly, nontender Extremities: No leg edema Lymph nodes: No cervical, supraclavicular, left axillary, or inguinal nodes.  2-3 cm nodal mass in the right axilla  Portacath/PICC-without erythema  Lab Results: Recent Labs    10/18/17 0634 10/20/17 0509  WBC 4.7 14.7*  HGB 7.9* 8.2*  HCT 22.6* 23.5*  PLT 69* 82*    BMET Recent Labs    10/19/17 0500 10/20/17 0509  NA 139 141  K 3.4* 3.8  CL 102 104  CO2 26 26  GLUCOSE 136* 208*  BUN 37* 48*  CREATININE 1.03 1.10  CALCIUM 8.6* 8.9    No results found for: CEA1  Studies/Results: Dg Chest Port 1 View  Result Date: 10/18/2017 CLINICAL DATA:  Pneumonia EXAM: PORTABLE CHEST 1 VIEW COMPARISON:  10/16/2017 FINDINGS: Right Port-A-Cath remains in place, unchanged. Diffuse bilateral nodular opacities and airspace disease again noted, unchanged. Heart is normal size. Small left effusion suspected. No acute bony abnormality. IMPRESSION: Diffuse bilateral nodular opacities and airspace disease with small left effusion. No change. Electronically Signed   By: Rolm Baptise M.D.   On: 10/18/2017 10:29    Medications: I have reviewed the patient's current medications.  Assessment/Plan:  1. 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  PET scan 03/20/2016 with extensive bulky hypermetabolic nodal activity in the neck, chest, abdomen  and pelvis; mild splenomegaly with diffusely mildly accentuated splenic activity  Staging bone marrow biopsy 03/23/2016-negative for involvement with lymphoma  Cycle 1 EPOCH beginning  03/23/2016  Cycle 2 [facility] 04/13/2016  Restaging PET scan at [facility] 05/08/2016-lymph nodes with various degrees of FDG activity in the neck, axilla, right written him, mesentery, pelvis, and groin. Indeterminate liver lesions without FDG activity, nodular mass in the posterior medial aspect of the left thigh  Cycle 3 EPOCH04/27/2018  Cycle 1 CVP 06/21/2016  Cytoxan/prednisone plus brentuximab 07/17/2016  Cytoxan/prednisone plus brentuximab 08/07/2016 (dose reduced)  Initiation of every three-week brentuximab08/08/2016  brentuximabdose reduced 10/31/2016 secondary to neuropathy  Brentuximab further dose reduced 01/03/2017 secondary to neuropathy  Presentation with parietal scalp nodular lesion 03/08/2017  Staging PET scan 07/21/5282-XLKGMWN hypermetabolic lymphadenopathy in the neck, chest, abdomen/pelvis, and hypermetabolic cutaneous lesions  03/22/2017 CT biopsy left retroperitoneal nodal mass-recurrent CD30 positive T-cell lymphoma  Cycle 1 bendamustine/CPI-613on clinical trial at [facility] 04/08/2017  CTs 05/22/2017 After 2 cycles-mixed response: Enlargement of left supraclavicular node, right axillary node, right pelvic sidewall mass, decreased retroperitoneal abdominal adenopathy  Cycle 3 bendamustine/CPI-613 06/03/2017  Cycle 4 bendamustine/CPI-613 07/09/2017  CT 08/05/2017- increase in the size of a level to a lymph node, decreased size of level 3 and supraclavicular nodes, enlargement of right axillary nodes, new bilateral pulmonary nodules, decreased mediastinal, rectal peritoneal, and iliac nodes  Cycle 5 bendamustine/CPI-6137/16/2019  Cycle 6 bendamustine/CPI-613 09/02/2017  Cycle 1 cisplatin/gemcitabine 09/30/2017 by day 8 gemcitabine held secondary to neutropenia/thrombocytopenia 2. 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 He entered clinical remission  following chemotherapy and remained in remission when he was seen at the cancer center 02/24/2009. 3. 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 Staging PET scan 08/23/2010 with increased FDG activity at the left tonsillar fossa and no additional evidence of lymphoma. He completed 4 cycles of R-ICE with cycle #1 beginning on 09/19/2010 and cycle #4 on 11/21/2010. Repeat head and neck examination by Dr. Constance Holster following R-ICE/rituximab showed no residual lymphoma. He began radiation consolidation on 12/18/2010, radiation was completed on 01/22/2011. 4. History of neutropenia secondary to rituximab. 5. Anemia secondary to chemotherapy, status post a red blood cell transfusion 11/30/2010. The hemoglobin has normalized. 6. History of mild thrombocytopenia secondary to chemotherapy. 7. Hypertension. 8. 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 05/08/2017-nodular mass in the left thigh adjacent to the femur  MRI 06/07/2016-infiltrative mass in the left thigh adductor muscle, separate from the schwannoma excision site  MRI 10/08/2016-marked improvement in the left adductormusclemass with a small area of enhancement remaining, no discrete mass 9. Port-A-Cath placement 03/21/2016 10. Superficial thrombus left greater saphenous vein 04/13/2016. Lovenox initiated, converted to Xarelto beginning 04/18/2016 11. 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 Admission 09/20/2017 with cough/fever/dyspnea-CT concerning for progression of lymphoma in the lungs versus pneumoniatreated with vancomycin/cefepime, started Levaquin 09/25/2017 13.  Admission 10/13/2017 with fever in the setting of severe neutropenia, no source for infection  identified other than possible pneumonia 14.   Respiratory failure- most likely secondary to progression of non-Hodgkin's lymphoma in the lungs       [name] has a history of progressive peripheral T-cell lymphoma.  He completed cycle 1 of salvage therapy with gemcitabine/cisplatin  on 09/30/2017.  He is now admitted with fever in the setting of severe neutropenia.  He has developed progressive respiratory failure.  I reviewed the CT images from this hospital admission.  I suspect the clinical presentation is related to progression of the T-cell lymphoma as opposed to pneumonia or drug-related pneumonitis.  There may have been a component of volume overload during the week.  His clinical status appears stable to improved over the past 1-2 days.  He had clinical improvement with steroids during the previous hospital admission.  I will recommend continuing high-dose steroids.  I discussed the case with Dr. Marin Olp.  We will most likely recommend treatment with Decadron, cytarabine, and cisplatin to begin within the next 1-2 days.  I will consult with the lymphoma service at [facility] regarding salvage treatment options with the goal of obtaining a partial remission.  If he improves he may be a candidate for CAR-T therapy.  The white count and platelets have recovered.  Recommendations: 1.  Continue Solu-Medrol 2.  Management of respiratory failure per critical care medicine 3.  Antibiotics as recommended by infectious disease 4.  Consult with lymphoma service at [facility] to decide on the next salvage systemic therapy regimen  Oncology will continue rounding on [name] daily.  LOS: 7 days   [name], MD   10/20/2017, 6:07 AM   He appears comfortable on high flow nasal cannula oxygen.  I reviewed the case with the malignant hematology service at [facility] and Dr. Reita Chard at [facility].  The consensus recommendation is to proceed with DHAP chemotherapy.  I discussed this plan with [name] and his family.   We reviewed potential toxicities associated with the DHAP regimen including the chance for hematologic toxicity, nausea/vomiting, mucositis, conjunctivitis, CNS toxicity, rash, and neuropathy.  He agrees to proceed.  The plan is to begin DHAP on 10/21/2017.  I will dose reduce the cytarabine and cisplatin based on his extensive treatment history and recent hematologic toxicity.

## 2017-10-21 ENCOUNTER — Inpatient Hospital Stay: Payer: 59

## 2017-10-21 ENCOUNTER — Inpatient Hospital Stay: Payer: 59 | Admitting: Nurse Practitioner

## 2017-10-21 DIAGNOSIS — R5381 Other malaise: Secondary | ICD-10-CM

## 2017-10-21 DIAGNOSIS — R509 Fever, unspecified: Secondary | ICD-10-CM

## 2017-10-21 DIAGNOSIS — R0602 Shortness of breath: Secondary | ICD-10-CM

## 2017-10-21 DIAGNOSIS — D899 Disorder involving the immune mechanism, unspecified: Secondary | ICD-10-CM

## 2017-10-21 DIAGNOSIS — R5383 Other fatigue: Secondary | ICD-10-CM

## 2017-10-21 DIAGNOSIS — D849 Immunodeficiency, unspecified: Secondary | ICD-10-CM

## 2017-10-21 LAB — CBC WITH DIFFERENTIAL/PLATELET
BASOS ABS: 0 10*3/uL (ref 0.0–0.1)
BASOS PCT: 0 %
Eosinophils Absolute: 0 10*3/uL (ref 0.0–0.7)
Eosinophils Relative: 0 %
HEMATOCRIT: 23.7 % — AB (ref 39.0–52.0)
Hemoglobin: 8.2 g/dL — ABNORMAL LOW (ref 13.0–17.0)
LYMPHS ABS: 0.5 10*3/uL — AB (ref 0.7–4.0)
Lymphocytes Relative: 4 %
MCH: 32.3 pg (ref 26.0–34.0)
MCHC: 34.6 g/dL (ref 30.0–36.0)
MCV: 93.3 fL (ref 78.0–100.0)
MONOS PCT: 6 %
Monocytes Absolute: 0.8 10*3/uL (ref 0.1–1.0)
NEUTROS ABS: 12.4 10*3/uL — AB (ref 1.7–7.7)
Neutrophils Relative %: 90 %
Platelets: 78 10*3/uL — ABNORMAL LOW (ref 150–400)
RBC: 2.54 MIL/uL — ABNORMAL LOW (ref 4.22–5.81)
RDW: 15 % (ref 11.5–15.5)
WBC: 13.7 10*3/uL — ABNORMAL HIGH (ref 4.0–10.5)

## 2017-10-21 LAB — GLUCOSE, CAPILLARY
GLUCOSE-CAPILLARY: 165 mg/dL — AB (ref 70–99)
GLUCOSE-CAPILLARY: 162 mg/dL — AB (ref 70–99)
Glucose-Capillary: 224 mg/dL — ABNORMAL HIGH (ref 70–99)
Glucose-Capillary: 241 mg/dL — ABNORMAL HIGH (ref 70–99)
Glucose-Capillary: 282 mg/dL — ABNORMAL HIGH (ref 70–99)

## 2017-10-21 LAB — COMPREHENSIVE METABOLIC PANEL
ALBUMIN: 2.2 g/dL — AB (ref 3.5–5.0)
ALT: 69 U/L — ABNORMAL HIGH (ref 0–44)
AST: 58 U/L — ABNORMAL HIGH (ref 15–41)
Alkaline Phosphatase: 48 U/L (ref 38–126)
Anion gap: 10 (ref 5–15)
BILIRUBIN TOTAL: 0.7 mg/dL (ref 0.3–1.2)
BUN: 47 mg/dL — ABNORMAL HIGH (ref 6–20)
CO2: 28 mmol/L (ref 22–32)
Calcium: 9 mg/dL (ref 8.9–10.3)
Chloride: 105 mmol/L (ref 98–111)
Creatinine, Ser: 1.03 mg/dL (ref 0.61–1.24)
Glucose, Bld: 182 mg/dL — ABNORMAL HIGH (ref 70–99)
POTASSIUM: 4.1 mmol/L (ref 3.5–5.1)
Sodium: 143 mmol/L (ref 135–145)
TOTAL PROTEIN: 5.1 g/dL — AB (ref 6.5–8.1)

## 2017-10-21 LAB — LACTATE DEHYDROGENASE: LDH: 494 U/L — ABNORMAL HIGH (ref 98–192)

## 2017-10-21 MED ORDER — ENOXAPARIN SODIUM 40 MG/0.4ML ~~LOC~~ SOLN
40.0000 mg | SUBCUTANEOUS | Status: DC
  Administered 2017-10-21 – 2017-10-26 (×6): 40 mg via SUBCUTANEOUS
  Filled 2017-10-21 (×5): qty 0.4

## 2017-10-21 MED ORDER — SODIUM CHLORIDE 0.9 % IV SOLN
70.0000 mg/m2 | Freq: Once | INTRAVENOUS | Status: AC
  Administered 2017-10-21: 136 mg via INTRAVENOUS
  Filled 2017-10-21: qty 100

## 2017-10-21 MED ORDER — LORAZEPAM 2 MG/ML IJ SOLN
0.5000 mg | INTRAMUSCULAR | Status: DC | PRN
  Administered 2017-10-21: 0.5 mg via INTRAVENOUS
  Filled 2017-10-21: qty 1

## 2017-10-21 MED ORDER — LORAZEPAM 0.5 MG PO TABS
0.5000 mg | ORAL_TABLET | Freq: Three times a day (TID) | ORAL | Status: DC | PRN
  Administered 2017-10-30 (×2): 0.5 mg via ORAL
  Filled 2017-10-21 (×2): qty 1

## 2017-10-21 MED ORDER — SODIUM CHLORIDE 0.9 % IV SOLN
150.0000 mg | Freq: Once | INTRAVENOUS | Status: AC
  Administered 2017-10-21: 150 mg via INTRAVENOUS
  Filled 2017-10-21: qty 5

## 2017-10-21 MED ORDER — SODIUM CHLORIDE 0.9 % IV SOLN
Freq: Once | INTRAVENOUS | Status: AC
  Administered 2017-10-21: 16 mg via INTRAVENOUS
  Filled 2017-10-21: qty 8

## 2017-10-21 MED ORDER — FLUCONAZOLE 100 MG PO TABS
100.0000 mg | ORAL_TABLET | Freq: Every day | ORAL | Status: AC
  Administered 2017-10-21 – 2017-10-25 (×5): 100 mg via ORAL
  Filled 2017-10-21 (×5): qty 1

## 2017-10-21 MED ORDER — ENSURE ENLIVE PO LIQD
237.0000 mL | Freq: Two times a day (BID) | ORAL | Status: DC
  Administered 2017-10-22 – 2017-10-28 (×4): 237 mL via ORAL

## 2017-10-21 MED ORDER — SALINE SPRAY 0.65 % NA SOLN
1.0000 | NASAL | Status: DC | PRN
  Filled 2017-10-21: qty 44

## 2017-10-21 MED ORDER — POTASSIUM CHLORIDE 2 MEQ/ML IV SOLN
Freq: Once | INTRAVENOUS | Status: AC
  Administered 2017-10-21: 10:00:00 via INTRAVENOUS
  Filled 2017-10-21: qty 10

## 2017-10-21 NOTE — Progress Notes (Signed)
Education, consent, and pre-AraC assessment completed with patient.  Patient verbalized understanding of education as well as the need for the AraC assessment prior to each treatment dose.  Patient has a base line tremor when fists are clenched.  Otherwise AraC assessment normal at baseline. Consent and AraC assessment sheet located on patient's shadow chart.

## 2017-10-21 NOTE — Progress Notes (Signed)
Patient's oxygen saturation is sustaining at 87% on BiPap. RT called to bedside to assist with BiPap settings. Settings adjusted by RT, oxygen saturation up to 94%. Will continue to monitor.

## 2017-10-21 NOTE — Progress Notes (Signed)
PROGRESS NOTE    [name]  AYT:[id number] DOB: 1960/03/03 DOA: 10/13/2017 PCP: [name], MD    Brief Narrative:57 year old with a history of non-Hodgkin's lymphoma diagnosed in 2012, T-cell lymphoma diagnosed in February 2018 with metastatic lung disease on home oxygen currently on chemotherapy comes to the hospital for treatment of fevers. Apparently for the past week or so patient has been having off-and-on fevers at home therefore was started on Levaquin about a week ago but despite of this continued to have fever. His last chemo cycle was about 2 weeks ago- gemcitabine and cisplatin. Was also noted to be neutropenic therefore admitted to the hospital for neutropenic fever. Pancultures were performed and he was started on vancomycin, aztreonam and Flagyl. Oncology and infectious disease were consulted.  He was recommended to continue same antibiotics except discontinuing Flagyl.  Granix was started. His counts have started improving. He was transferred to step down for acute respiratory failure with hypoxia.   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  He was transferred to step down for acute respiratory failure.  CT angio of the chest negative for PE. He was started on IV lasix 40 mg BID  And diuresed appropriately,. He was also started on IV steroids as per oncology recommendations and now transitioned to decadron for chemo.  Pt currently requiring BIPAP at night and during the day, he is on high flow oxygen 40% with sats in low 90%.  He was started on  chemotherapy today by DR [name].  PCCM on board and assisting in management.  He has completed 7 days of broad spectrum IV antibiotics. ID signed off.   Febrile neutropenia: sepsis ruled out. Counts improving on granix,  He has completed 7 days of broad spectrum IV antibiotics. ID signed off.  Blood cultures negative so far. ID on board and assisting with management.  Continues to have fevers , suspect from lymphoma.    T cell Lymphoma with mets and h/o non Hodgkin's lymphoma   S/p  last chemo cycle was about 2 weeks ago- gemcitabine and cisplatin.  Dr Learta Codding is his primary oncologist.  He was started on chemo as per oncology.    H/o of superficial vein thrombosis:  Was on Xarelto, at home, which was held of thrombocytopenia.  When platelets normalize, we can restart xarelto.     Hypertension:  Well controlled on atenolol.    Anxiety:  Prn ativan.    Hypokalemia: Replaced.    Hyperglycemia;  From high dose steroids.  Family wants to hold off on SSI.   Anemia and thrombocytopenia:  Improving with granix.   DVT prophylaxis: heparin  Code Status: Full code.  Family Communication: discussed with daughter at bedside.  Disposition Plan: pending clinical improvement.   Consultants:   Oncology Dr Marin Olp  Pulmonology on call.    Procedures: CT angio of the chest .   Antimicrobials: Vancomycin and cefepime since 9/23. Till 9/30  Subjective: On BIPAP now, in mild to distress.   Objective: Vitals:   10/21/17 0917 10/21/17 0956 10/21/17 1151 10/21/17 1200  BP:    Marland Kitchen)  144/91  Pulse: 82 82  77  Resp: (!) 31 (!) 30  (!) 27  Temp: 98.8 F (37.1 C) 99 F (37.2 C)  98.8 F (37.1 C)  TempSrc:      SpO2: (!) 88% (!) 83% 93% (!) 89%  Weight:      Height:        Intake/Output Summary (Last 24 hours) at 10/21/2017 1445 Last data filed at 10/21/2017 1418 Gross per 24 hour  Intake 708.08 ml  Output 580 ml  Net 128.08 ml   Filed Weights   10/13/17 1907  10/13/17 2121  Weight: 79.4 kg 78.3 kg    Examination:  General exam: on Bipap. In mild distress.  Respiratory system: air entry fair, coarse breath sounds, no wheezing.  Cardiovascular system: S1 & S2 heard, tachycardic, no JVD or murmer.  Gastrointestinal system: Abdomen is soft non tender non distended. Bowel sounds good.  Central nervous system: Alert and oriented. Non focal.  Extremities: Symmetric 5 x 5 power. No pedal edema.  Skin: No rashes, lesions or ulcers Psychiatry: mood appropriate.    Data Reviewed: I have personally reviewed following labs and imaging studies  CBC: Recent Labs  Lab 10/16/17 0349 10/17/17 0602 10/18/17 0634 10/20/17 0509 10/21/17 0359  WBC 0.5* 1.2* 4.7 14.7* 13.7*  NEUTROABS 0.3 0.8 4.0 14.3* 12.4*  HGB 8.2* 7.5* 7.9* 8.2* 8.2*  HCT 23.7* 21.5* 22.6* 23.5* 23.7*  MCV 93.3 92.3 91.9 91.8 93.3  PLT 51* 64* 69* 82* 78*   Basic Metabolic Panel: Recent Labs  Lab 10/15/17 0420 10/16/17 0349 10/17/17 0602 10/18/17 0634 10/19/17 0500 10/20/17 0509 10/21/17 0359  NA 143 142 141 141 139 141 143  K 4.1 3.8 3.6 3.7 3.4* 3.8 4.1  CL 112* 111 106 104 102 104 105  CO2 25 24 26 26 26 26 28   GLUCOSE 123* 137* 125* 167* 136* 208* 182*  BUN 13 12 22* 34* 37* 48* 47*  CREATININE 0.83 0.83 0.77 1.13 1.03 1.10 1.03  CALCIUM 7.7* 8.0* 8.3* 8.4* 8.6* 8.9 9.0  MG 1.8 1.8 1.7 1.8 1.8  --   --    GFR: Estimated Creatinine Clearance: 76.6 mL/min (by C-G formula based on SCr of 1.03 mg/dL). Liver Function Tests: Recent Labs  Lab 10/20/17 0509 10/21/17 0359  AST 25 58*  ALT 32 69*  ALKPHOS 48 48  BILITOT 0.7 0.7  PROT 5.3* 5.1*  ALBUMIN 2.2* 2.2*   No results for input(s): LIPASE, AMYLASE in the last 168 hours. No results for input(s): AMMONIA in the last 168 hours. Coagulation Profile: No results for input(s): INR, PROTIME in the last 168 hours. Cardiac Enzymes: No results for input(s): CKTOTAL, CKMB, CKMBINDEX, TROPONINI in the last 168  hours. BNP (last 3 results) No results for input(s): PROBNP in the last 8760 hours. HbA1C: No results for input(s): HGBA1C in the last 72 hours. CBG: Recent Labs  Lab 10/20/17 1700 10/20/17 1949 10/21/17 0001 10/21/17 0358 10/21/17 1219  GLUCAP 260* 335* 241* 165* 162*   Lipid Profile: No results for input(s): CHOL, HDL, LDLCALC, TRIG, CHOLHDL, LDLDIRECT in the last 72 hours. Thyroid Function Tests: No results for input(s): TSH, T4TOTAL, FREET4, T3FREE, THYROIDAB in the last 72 hours. Anemia Panel: No results for input(s): VITAMINB12, FOLATE, FERRITIN, TIBC, IRON, RETICCTPCT in the last 72 hours. Sepsis Labs: Recent Labs  Lab 10/17/17 1436 10/18/17 0634 10/19/17 0500  PROCALCITON 0.36 0.47 0.70    Recent Results (from the past 240 hour(s))  Culture, blood (Routine  x 2)     Status: None   Collection Time: 10/13/17  4:05 PM  Result Value Ref Range Status   Specimen Description   Final    PORTA CATH Performed at [facility], [place] [address] [place], [place] [address]    Special Requests   Final    BOTTLES DRAWN AEROBIC AND ANAEROBIC Blood Culture adequate volume Performed at [place] [address] [place], [place] [address]    Culture   Final    NO GROWTH 5 DAYS Performed at [facility], [place] [address] [place], [place] [address]    Report Status 10/18/2017 FINAL  Final  Urine Culture     Status: None   Collection Time: 10/13/17  6:29 PM  Result Value Ref Range Status   Specimen Description   Final    URINE, RANDOM Performed at [place] [address], [place], [place] [address]    Special Requests   Final    NONE Performed at [facility], [place] [address], [place], [place] [address]    Culture   Final    NO GROWTH Performed at [facility], [place] [address], [place], [place] [address]    Report Status 10/15/2017 FINAL  Final  Culture, blood (Routine x 2)      Status: None   Collection Time: 10/14/17  4:57 AM  Result Value Ref Range Status   Specimen Description   Final    BLOOD RIGHT HAND Performed at [place] [address] [place], [place] [address]    Special Requests   Final    BOTTLES DRAWN AEROBIC ONLY Blood Culture results may not be optimal due to an inadequate volume of blood received in culture bottles Performed at [place] [address] [place], [place] [address]    Culture   Final    NO GROWTH 5 DAYS Performed at [facility], [place] [address] [place], [place] [address]    Report Status 10/19/2017 FINAL  Final  Respiratory Panel by PCR     Status: None   Collection Time: 10/15/17  3:03 PM  Result Value Ref Range Status   Adenovirus NOT DETECTED NOT DETECTED Final   Coronavirus 229E NOT DETECTED NOT DETECTED Final   Coronavirus HKU1 NOT DETECTED NOT DETECTED Final   Coronavirus NL63 NOT DETECTED NOT DETECTED Final   Coronavirus OC43 NOT DETECTED NOT DETECTED Final   Metapneumovirus NOT DETECTED NOT DETECTED Final   Rhinovirus / Enterovirus NOT DETECTED NOT DETECTED Final   Influenza A NOT DETECTED NOT DETECTED Final   Influenza B NOT DETECTED NOT DETECTED Final   Parainfluenza Virus 1 NOT DETECTED NOT DETECTED Final   Parainfluenza Virus 2 NOT DETECTED NOT DETECTED Final   Parainfluenza Virus 3 NOT DETECTED NOT DETECTED Final   Parainfluenza Virus 4 NOT DETECTED NOT DETECTED Final   Respiratory Syncytial Virus NOT DETECTED NOT DETECTED Final   Bordetella pertussis NOT DETECTED NOT DETECTED Final   Chlamydophila pneumoniae NOT DETECTED NOT DETECTED Final   Mycoplasma pneumoniae NOT DETECTED NOT DETECTED Final    Comment: Performed at [facility], [place] [address] [place], [place] [address]  MRSA PCR Screening     Status: None   Collection Time: 10/16/17  9:20 AM  Result Value Ref Range Status   MRSA by PCR NEGATIVE NEGATIVE Final    Comment:  The  GeneXpert MRSA Assay (FDA approved for NASAL specimens only), is one component of a comprehensive MRSA colonization surveillance program. It is not intended to diagnose MRSA infection nor to guide or monitor treatment for MRSA infections. Performed at [facility], [place] [address], [place], [place] [address]          Radiology Studies: Dg Chest Port 1 View  Result Date: 10/20/2017 CLINICAL DATA:  Respiratory failure EXAM: PORTABLE CHEST 1 VIEW COMPARISON:  10/18/2017 FINDINGS: Port in the anterior chest wall with tip in distal SVC. Normal cardiac silhouette. Bilateral pulmonary nodules. Diffuse airspace disease. Findings correspond to CT 10/16/2017. Small to moderate LEFT effusion. IMPRESSION: 1. No interval change. 2. Bilateral pulmonary nodules on the background of diffuse airspace disease. Electronically Signed   By: Suzy Bouchard M.D.   On: 10/20/2017 07:47        Scheduled Meds: . atenolol  50 mg Oral Daily  . chlorpheniramine-HYDROcodone  5 mL Oral Once  . CISplatin (PLATINOL) CHEMO IV infusion (Inpatient over 24 hr)  70 mg/m2 (Treatment Plan Recorded) Intravenous Once  . enoxaparin (LOVENOX) injection  40 mg Subcutaneous Q24H  . feeding supplement (ENSURE ENLIVE)  237 mL Oral BID BM  . fluconazole  100 mg Oral Daily  . insulin aspart  0-9 Units Subcutaneous Q4H  . loratadine  10 mg Oral Daily  . LORazepam  0.5 mg Oral Q8H  . mouth rinse  15 mL Mouth Rinse BID   Continuous Infusions: . sodium chloride Stopped (10/21/17 1352)     LOS: 8 days    Time spent: 35 minutes.     Hosie Poisson, MD [facility] Pager [phone]  If 7PM-7AM, please contact night-coverage [email or web address] Password [email or web address] 10/21/2017, 2:45 PM

## 2017-10-21 NOTE — Progress Notes (Signed)
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  Admitted on 9/22 with neutropenic fever, started on broad-spectrum antibiotics. Critical care consulted 9/25 for acute on chronic hypoxic respiratory failure  Significant Hospital Events    9/23: Infectious disease consulted.  Had developed mild worsening and chronic cough and shortness of breath recommended to continue vancomycin and cefepime possible pneumonia.  Was given Granix per hematology with plans to continue this until [place] was over 1000.  Chest x-ray with diffuse bilateral pulmonary infiltrates chronic in nature since late Aug 2019, 9/24: Having intermittent fever, cough a little bit worse.  One episode of nausea vomiting after forceful cough.  Room air oxygenation at 77% 9/25: Increased tachypnea.  Fluid challenge administered earlier.  Given IV Lasix.  Chest x-ray obtained showed progressive left lower lobe airspace disease, and persistent bilateral patchy airspace disease, oxygen requirements up to 7 L, CT chest negative for pulmonary emboli.  Moved to stepdown unit, trickle care asked to see for acute on chronic respiratory failure.  9/26: feels better. Still on high FIO2 cough and fever improved. Over all looking better. Neg almost 2 liters over 24 hrs but still > 4 liters + 9/27 still febrile still w/ high FIO2 needs systemic steroids started 9/27 through 9/30: Antibiotics continued.  Still requiring high flow oxygen and BiPAP alternating.  Aspergillosis antibody sent.  Steroid dosing increased on the 28th.  On the 30th still very hypoxic rapidly desaturating.  Case was discussed with oncology at [facility] as well as [facility], the consensus was to proceed with DHAP chemotherapy, this was toc be initiated at reduced therapy due to recent hematologic toxicity   Consults: date of consult/date signed off & final recs:  Pulmonary consulted 9/25  Procedures (surgical and bedside):    Significant Diagnostic Tests:  Echocardiogram from 8/31: Ejection fraction 55 to 60% wall Motion was normal diastolic function normal wall CT angiogram 9/25: Negative for pulmonary edema.  Increased mediastinal adenopathy, hilar adenopathy, and central groundglass opacities.  Also has left pleural effusion.  This looks small to moderate in size.  Pulmonary masses and right axillary adenopathy are stable.  Increased consolidation left lower lobe.  Micro Data: Respiratory virus panel 9/24: Negative Blood cultures obtained 9/23:>>> Urine culture 9/22: Negative  Antimicrobials:  Aztreonam 9/22 through 9/23 Cefepime 9/23>>> Metronidazole 9/22 through 9/23 Vancomycin 9/22>>>  Subjective:  Feels better   Objective   Blood pressure (Abnormal) 158/93, pulse 82, temperature 99 F (37.2 C), resp. rate (Abnormal) 30, height 5\' 8"  (1.727 m), weight 78.3 kg, SpO2 (Abnormal) 83 %.    FiO2 (%):  [75 %-100 %] 100 %   Intake/Output Summary (Last 24 hours) at 10/21/2017 1038 Last data filed at 10/21/2017 4193 Gross per 24 hour  Intake 551.79 ml  Output 1715 ml  Net -1163.21 ml   Filed Weights   10/13/17 1907 10/13/17 2121  Weight: 79.4 kg 78.3 kg    Examination: General: This is a 57 year old white male is currently on noninvasive positive pressure ventilation.  He is in no acute distress but is quite fatigued HEENT: Normocephalic atraumatic BiPAP mask in place Pulmonary: Basilar rales no accessory use currently on BiPAP Cardiac:  Regular rate and rhythm Abdomen: Soft nontender Extremities: Warm, dry, trace lower extremity edema strong pulses Neuro: Awake oriented no focal deficits GU: Clear yellow urine    Resolved  Hospital Problem list     Assessment & Plan:   Acute on chronic hypoxic respiratory failure in the setting of diffuse pulmonary infiltrates felt due to the neutropenia versus worsening lymphoma involvement of the lung Continues to rapidly desaturate requiring high flow oxygen and BiPAP PRN Has been on systemic steroids since 9/27, systemic steroids were increased 9/28  portable chest x-ray personally reviewed from 9/29:Continued patchy bilateral infiltrates actually seem to have progressed a little bit when comparing from 9/27 Plan Continue vancomycin and cefepime as directed by infectious disease Following up Aspergillus antibody Continue to alternate BiPAP and high flow oxygen Steroids discontinued For chemotherapy today 9/30 If he decompensates he would want mechanical ventilation, at which time bronchoscopy would be offered, he is been too ill to tolerate this thus far As long as he can continue #1 taking in adequate oral intake #2 is off BiPAP periodically during the day and #3 is able to tolerate noninvasive and feels symptomatic relief when used I think we can continue BiPAP therapy.  Would prefer early intubation if at any point he is not able to satisfy 1 of the 3 options above.  Pancytopenia. W/ febrile neutropenia  Last chemotherapy approximately 2 weeks ago.  Granix started on 9/23, has since been discontinued -Hemoglobin has remained stable White blood cell count now elevated, had been neutropenic, ANA now high Plan Continue to trend CBC and fever count  T-cell lymphoma with metastasis to lung Plan Per  oncology   History of superficial DVT Had been on Xarelto since 2018 Plan [place] heparin to start  Trend cbc   Disposition / Summary of Today's Plan 10/21/17   57 year old male patient he still lymphoma presents with neutropenic fever, plus/minus pneumonia.  We initially thought his decompensation was secondary to volume overload on top of pneumonia.  He has not improved  very much with diuresis.  Seems most likely explanation is pulmonary involvement from his lymphoma.  Also wonder about additional infectious component such as fungal.  Unfortunately he has not wanted bronchoscopy nor was he stable enough to do this.  There is an aspergillosis antibody pending.  For today the plan is to go forward with chemotherapy, I have discussed goals of care with wife and daughter.  They would want aggressive therapy at least to see if chemotherapy is successful.  This would include intubation and mechanical ventilation.  If he were to be intubated I think we should proceed with bronchoscopy    Diet: Heart healthy Pain/Anxiety/Delirium protocol (if indicated): Not indicated VAP protocol (if indicated): Not indicated DVT prophylaxis: [place] LMWH GI prophylaxis: Not indicated Hyperglycemia protocol: Not indicated Mobility: Bedrest Code Status: Full code Family Communication: Family updated  Labs   CBC: Recent Labs  Lab 10/16/17 0349 10/17/17 0602 10/18/17 0634 10/20/17 0509 10/21/17 0359  WBC 0.5* 1.2* 4.7 14.7* 13.7*  NEUTROABS 0.3 0.8 4.0 14.3* 12.4*  HGB 8.2* 7.5* 7.9* 8.2* 8.2*  HCT 23.7* 21.5* 22.6* 23.5* 23.7*  MCV 93.3 92.3 91.9 91.8 93.3  PLT 51* 64* 69* 82* 78*    Basic Metabolic Panel: Recent Labs  Lab 10/15/17 0420 10/16/17 0349 10/17/17 0602 10/18/17 0634 10/19/17 0500 10/20/17 0509 10/21/17 0359  NA 143 142 141 141 139 141 143  K 4.1 3.8 3.6 3.7 3.4* 3.8 4.1  CL 112* 111 106  104 102 104 105  CO2 25 24 26 26 26 26 28   GLUCOSE 123* 137* 125* 167* 136* 208* 182*  BUN 13 12 22* 34* 37* 48* 47*  CREATININE 0.83 0.83 0.77 1.13 1.03 1.10 1.03  CALCIUM 7.7* 8.0* 8.3* 8.4* 8.6* 8.9 9.0  MG 1.8 1.8 1.7 1.8 1.8  --   --    GFR: Estimated Creatinine Clearance: 76.6 mL/min (by C-G formula based on SCr of 1.03 mg/dL). Recent Labs  Lab 10/17/17 0602 10/17/17 1436 10/18/17 0634 10/19/17 0500 10/20/17 0509 10/21/17 0359  PROCALCITON  --  0.36  0.47 0.70  --   --   WBC 1.2*  --  4.7  --  14.7* 13.7*    Liver Function Tests: Recent Labs  Lab 10/20/17 0509 10/21/17 0359  AST 25 58*  ALT 32 69*  ALKPHOS 48 48  BILITOT 0.7 0.7  PROT 5.3* 5.1*  ALBUMIN 2.2* 2.2*   No results for input(s): LIPASE, AMYLASE in the last 168 hours. No results for input(s): AMMONIA in the last 168 hours.  ABG    Component Value Date/Time   PHART 7.418 10/16/2017 0802   PCO2ART 34.8 10/16/2017 0802   PO2ART 36.3 (LL) 10/16/2017 0802   HCO3 22.0 10/16/2017 0802   ACIDBASEDEF 1.5 10/16/2017 0802   O2SAT 62.9 10/16/2017 0802     Coagulation Profile: No results for input(s): INR, PROTIME in the last 168 hours.  Cardiac Enzymes: No results for input(s): CKTOTAL, CKMB, CKMBINDEX, TROPONINI in the last 168 hours.  HbA1C: No results found for: HGBA1C  CBG: Recent Labs  Lab 10/20/17 1004 10/20/17 1700 10/20/17 1949 10/21/17 0001 10/21/17 0358  GLUCAP 261* 260* 335* [place] ACNP-BC [place] Pager # [phone] OR # [phone] if no answer

## 2017-10-21 NOTE — Progress Notes (Signed)
FMLA successfully faxed to [employer] at [phone]. Mailed copy to patient address on file. 

## 2017-10-21 NOTE — Progress Notes (Signed)
Dose and dilution for cisplatin verified with Aldean Baker, RN.

## 2017-10-21 NOTE — Progress Notes (Signed)
Patient refusing insulin at this time. Reasoning for insulin need during high dose steroid interventions explained with no effect. Will repeat glucose check at 0400.

## 2017-10-21 NOTE — Progress Notes (Signed)
IP PROGRESS NOTE  Subjective:   [name] has no new complaint.  He feels stable from a respiratory standpoint.  Objective: Vital signs in last 24 hours: Blood pressure 127/79, pulse 75, temperature 97.7 F (36.5 C), resp. rate (!) 29, height _0  (1.727 m), weight 172 lb 9.6 oz (78.3 kg), SpO2 90 %.  Intake/Output from previous day: 09/29 0701 - 09/30 0700 In: 484.8 [IV Piggyback:484.8] Out: 2115 [Urine:2115]  Physical Exam:  HEENT: Thrush over the tongue and buccal mucosa, crusted areas at the parietal scalp Lungs: Decreased breath sounds at the left lower chest, end inspiratory rales at the lower posterior chest bilaterally Cardiac: Regular rate and rhythm Abdomen: No hepatosplenomegaly, nontender Extremities: No leg edema   Portacath/PICC-without erythema  Lab Results: Recent Labs    10/20/17 0509 10/21/17 0359  WBC 14.7* 13.7*  HGB 8.2* 8.2*  HCT 23.5* 23.7*  PLT 82* 78*    BMET Recent Labs    10/20/17 0509 10/21/17 0359  NA 141 143  K 3.8 4.1  CL 104 105  CO2 26 28  GLUCOSE 208* 182*  BUN 48* 47*  CREATININE 1.10 1.03  CALCIUM 8.9 9.0    Studies/Results: Dg Chest Port 1 View  Result Date: 10/20/2017 CLINICAL DATA:  Respiratory failure EXAM: PORTABLE CHEST 1 VIEW COMPARISON:  10/18/2017 FINDINGS: Port in the anterior chest wall with tip in distal SVC. Normal cardiac silhouette. Bilateral pulmonary nodules. Diffuse airspace disease. Findings correspond to CT 10/16/2017. Small to moderate LEFT effusion. IMPRESSION: 1. No interval change. 2. Bilateral pulmonary nodules on the background of diffuse airspace disease. Electronically Signed   By: [name] M.D.   On: 10/20/2017 07:47    Medications: I have reviewed the patient's current medications.  Assessment/Plan:  1. 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  PET scan 03/20/2016 with extensive bulky hypermetabolic nodal activity in the neck, chest, abdomen and pelvis; mild splenomegaly with diffusely mildly accentuated splenic activity  Staging bone marrow biopsy 03/23/2016-negative for involvement with lymphoma  Cycle 1 EPOCH beginning 03/23/2016  Cycle 2 [facility] 04/13/2016  Restaging PET scan at [facility] 05/08/2016-lymph nodes with various degrees of FDG activity in the neck, axilla, right written him, mesentery, pelvis, and groin. Indeterminate liver lesions without FDG activity, nodular mass in the posterior medial aspect of the left thigh  Cycle 3 EPOCH04/27/2018  Cycle 1 CVP 06/21/2016  Cytoxan/prednisone plus brentuximab 07/17/2016  Cytoxan/prednisone plus brentuximab 08/07/2016 (dose reduced)  Initiation of every three-week brentuximab08/08/2016  brentuximabdose reduced 10/31/2016 secondary to neuropathy  Brentuximab further dose reduced 01/03/2017 secondary to neuropathy  Presentation with parietal scalp nodular lesion 03/08/2017  Staging PET scan 9/62/[id number] hypermetabolic lymphadenopathy in the neck, chest, abdomen/pelvis, and hypermetabolic cutaneous lesions  03/22/2017 CT biopsy left retroperitoneal nodal mass-recurrent CD30 positive T-cell lymphoma  Cycle 1 bendamustine/CPI-613on clinical trial at [facility] 04/08/2017  CTs 05/22/2017 After 2 cycles-mixed response: Enlargement of left supraclavicular node, right axillary node, right pelvic sidewall mass, decreased retroperitoneal abdominal adenopathy  Cycle 3 bendamustine/CPI-613 06/03/2017  Cycle 4 bendamustine/CPI-613 07/09/2017  CT 08/05/2017- increase in the size of a level to a lymph node,  decreased size of level 3 and supraclavicular nodes, enlargement of right  axillary nodes, new bilateral pulmonary nodules, decreased mediastinal, rectal peritoneal, and iliac nodes  Cycle 5 bendamustine/CPI-6137/16/2019  Cycle 6 bendamustine/CPI-613 09/02/2017  Cycle 1 cisplatin/gemcitabine 09/30/2017 by day 8 gemcitabine held secondary to neutropenia/thrombocytopenia  Cycle 1 DHAP 10/21/2017 2. 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 He entered clinical remission following chemotherapy and remained in remission when he was seen at the cancer center 02/24/2009. 3. 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 Staging PET scan 08/23/2010 with increased FDG activity at the left tonsillar fossa and no additional evidence of lymphoma. He completed 4 cycles of R-ICE with cycle #1 beginning on 09/19/2010 and cycle #4 on 11/21/2010. Repeat head and neck examination by Dr. Constance Holster following R-ICE/rituximab showed no residual lymphoma. He began radiation consolidation on 12/18/2010, radiation was completed on 01/22/2011. 4. History of neutropenia secondary to rituximab. 5. Anemia secondary to chemotherapy, status post a red blood cell transfusion 11/30/2010. The hemoglobin has normalized. 6. History of mild thrombocytopenia secondary to chemotherapy. 7. Hypertension. 8. 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 05/08/2017-nodular mass in the left thigh adjacent to the femur  MRI 06/07/2016-infiltrative mass in the left thigh adductor muscle, separate from the schwannoma excision site  MRI 10/08/2016-marked improvement in the left adductormusclemass with a small area of enhancement remaining, no discrete  mass 9. Port-A-Cath placement 03/21/2016 10. Superficial thrombus left greater saphenous vein 04/13/2016. Lovenox initiated, converted to Xarelto beginning 04/18/2016 11. 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 Admission 09/20/2017 with cough/fever/dyspnea-CT concerning for progression of lymphoma in the lungs versus pneumoniatreated with vancomycin/cefepime, started Levaquin 09/25/2017 13.  Admission 10/13/2017 with fever in the setting of severe neutropenia, no source for infection identified other than possible pneumonia 14.   Respiratory failure- most likely secondary to progression of non-Hodgkin's lymphoma in the lungs       [name] appears unchanged.  He continues to require high level oxygen support.  I discussed treatment options again today with [name] and his family.  He understands the small chance of a clinical response with the DHAP.  He understands the risk of hematologic toxicity, especially given his treatment history and current mild thrombocytopenia.  We reviewed potential toxicities associated with this regimen.  He agrees to proceed. I have discussed the case with my colleagues, and the malignant hematology services at [facility] and [place].  The consensus is to proceed with DHAP. I will discontinue Lasix and add hydration while on cisplatin.  Solu-Medrol will be discontinued since he be receiving Decadron.  He will begin a course of Diflucan for candidiasis.  Recommendations: 1.  Discontinue Solu-Medrol, proceed with DHAP chemotherapy today 2.  Management of respiratory failure per critical care medicine 3.  Antibiotics as recommended by infectious disease 4.  Hold Lasix and begin intravenous hydration while on cisplatin    LOS: 8 days   [name], MD   10/21/2017, 7:09 AM   He appears comfortable on high flow nasal cannula oxygen.  I reviewed the case with the malignant  hematology service at [facility] and Dr. Reita Chard at [facility].  The consensus recommendation is to proceed with DHAP chemotherapy.  I discussed this plan with [name] and his family.  We reviewed potential toxicities  associated with the DHAP regimen including the chance for hematologic toxicity, nausea/vomiting, mucositis, conjunctivitis, CNS toxicity, rash, and neuropathy.  He agrees to proceed.  The plan is to begin DHAP on 10/21/2017.  I will dose reduce the cytarabine and cisplatin based on his extensive treatment history and recent hematologic toxicity.

## 2017-10-21 NOTE — Progress Notes (Signed)
Today's CBC has been discussed w/ Dr. [name] & ok to proceed w/ reduced dose chemo. Kennith Center, Pharm.D., CPP 10/21/2017@9 :03 AM

## 2017-10-21 NOTE — Progress Notes (Signed)
Patient ID: [name], male   DOB: 09-27-1960, 57 y.o.   MRN: [id number]         [facility]  Date of Admission:  10/13/2017           Day 8 vancomycin        Day 8 cefepime ASSESSMENT: He has fever and neutropenia have resolved.  I will stop vancomycin and cefepime now.  PLAN: 1. Discontinue vancomycin and cefepime 2. 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 atenolol  50 mg Oral Daily  . chlorpheniramine-HYDROcodone  5 mL Oral Once  . CISplatin (PLATINOL) CHEMO IV infusion (Inpatient over 24 hr)  70 mg/m2 (Treatment Plan Recorded) Intravenous Once  . enoxaparin (LOVENOX) injection  40 mg Subcutaneous Q24H  . feeding supplement (ENSURE ENLIVE)  237 mL Oral BID BM  . fluconazole  100 mg Oral Daily  . insulin aspart  0-9 Units Subcutaneous Q4H  . loratadine  10 mg Oral Daily  . LORazepam  0.5 mg Oral Q8H  . mouth rinse  15 mL Mouth Rinse BID   Continuous Infusions: . sodium chloride Stopped (10/21/17 1352)  . ceFEPime (MAXIPIME) IV Stopped (10/21/17 1345)  . vancomycin Stopped (10/21/17 0109)   PRN Meds:.sodium chloride, acetaminophen **OR** acetaminophen, albuterol, benzonatate, bisacodyl, chlorpheniramine-HYDROcodone, guaiFENesin-dextromethorphan, ketorolac, lidocaine-prilocaine, magnesium citrate, ondansetron **OR** ondansetron (ZOFRAN) IV, oxyCODONE, prochlorperazine, senna-docusate, sodium chloride flush, zolpidem   SUBJECTIVE: He continues to be short of breath and hypoxic and is now using BiPAP.  However, he has not had any fever for the past 48 hours and his neutropenia has resolved.  Review of Systems: Review of Systems  Constitutional: Positive for malaise/fatigue and  weight loss. Negative for chills, diaphoresis and fever.  Respiratory: Positive for cough and shortness of breath. Negative for hemoptysis and sputum production.   Cardiovascular: Negative for chest pain.  Gastrointestinal: Negative for abdominal pain, diarrhea, nausea and vomiting.  Skin: Negative for rash.  Neurological: Negative for headaches.    Allergies  Allergen Reactions  . Penicillins Other (See Comments)    Childhood allergy reaction unknown  Has patient had a PCN reaction causing immediate rash, facial/tongue/throat swelling, SOB or lightheadedness with hypotension: Unknown Has patient had a PCN reaction causing severe rash involving mucus membranes or skin necrosis: Unknown Has patient had a PCN reaction that required hospitalization: Unknown Has patient had a PCN reaction occurring within the last 10 years: Unknown If all of the above answers are "NO", then may proceed with Cephalosporin use.     OBJECTIVE: Vitals:   10/21/17 0917 10/21/17 0956 10/21/17 1151 10/21/17 1200  BP:    (!) 144/91  Pulse: 82 82  77  Resp: (!) 31 (!) 30  (!) 27  Temp: 98.8 F (37.1 C) 99 F (37.2 C)  98.8 F (37.1 C)  TempSrc:      SpO2: (!) 88% (!) 83% 93% (!) 89%  Weight:      Height:       Body mass index is 26.24 kg/m.  Physical Exam  Constitutional: He is oriented to person, place, and time.  He is sitting up in bed visiting with his wife and daughter.  He is on BiPAP.  Cardiovascular: Normal rate, regular rhythm and normal heart sounds.  No murmur heard. Pulmonary/Chest: Effort normal. He has no wheezes. He has rales.  Port-A-Cath site appears normal.  He started new, DHAP chemotherapy through his port today.  Neurological: He is alert and oriented to person, place, and time.  Psychiatric: He has a normal mood and affect.    Lab Results Lab Results  Component Value Date   WBC 13.7 (H) 10/21/2017   HGB 8.2 (L) 10/21/2017   HCT 23.7 (L) 10/21/2017   MCV 93.3 10/21/2017     PLT 78 (L) 10/21/2017    Lab Results  Component Value Date   CREATININE 1.03 10/21/2017   BUN 47 (H) 10/21/2017   NA 143 10/21/2017   K 4.1 10/21/2017   CL 105 10/21/2017   CO2 28 10/21/2017    Lab Results  Component Value Date   ALT 69 (H) 10/21/2017   AST 58 (H) 10/21/2017   ALKPHOS 48 10/21/2017   BILITOT 0.7 10/21/2017     Microbiology: Recent Results (from the past 240 hour(s))  Culture, blood (Routine x 2)     Status: None   Collection Time: 10/13/17  4:05 PM  Result Value Ref Range Status   Specimen Description   Final    PORTA CATH Performed at [facility], [place] [address] [place], [place] [address]    Special Requests   Final    BOTTLES DRAWN AEROBIC AND ANAEROBIC Blood Culture adequate volume Performed at [facility] [address] [place], [place] [address]    Culture   Final    NO GROWTH 5 DAYS Performed at [facility], [place] [address], [place], [place] [address]    Report Status 10/18/2017 FINAL  Final  Urine Culture     Status: None   Collection Time: 10/13/17  6:29 PM  Result Value Ref Range Status   Specimen Description   Final    URINE, RANDOM Performed at [facility] [address], [place], [place] [address]    Special Requests   Final    NONE Performed at [facility], [place] [address] [place], [place] [address]    Culture   Final    NO GROWTH Performed at [facility], [place] [address], [place], [place] [address]    Report Status 10/15/2017 FINAL  Final  Culture, blood (Routine x 2)     Status: None   Collection Time: 10/14/17  4:57 AM  Result Value Ref Range Status   Specimen Description   Final    BLOOD RIGHT HAND Performed at [facility] [address] [place], [place] [address]    Special Requests   Final    BOTTLES DRAWN AEROBIC ONLY Blood Culture results may not be optimal due to an inadequate volume of blood  received in culture bottles Performed at [facility] [address], [place], [place] [address]    Culture   Final    NO GROWTH 5 DAYS Performed at [facility], [place] [address], [place],  [address]    Report Status 10/19/2017 FINAL  Final  Respiratory Panel by PCR     Status: None   Collection Time: 10/15/17  3:03 PM  Result Value Ref Range Status   Adenovirus NOT DETECTED NOT DETECTED Final   Coronavirus 229E NOT DETECTED NOT DETECTED Final   Coronavirus HKU1 NOT DETECTED NOT DETECTED Final   Coronavirus  NL63 NOT DETECTED NOT DETECTED Final   Coronavirus OC43 NOT DETECTED NOT DETECTED Final   Metapneumovirus NOT DETECTED NOT DETECTED Final   Rhinovirus / Enterovirus NOT DETECTED NOT DETECTED Final   Influenza A NOT DETECTED NOT DETECTED Final   Influenza B NOT DETECTED NOT DETECTED Final   Parainfluenza Virus 1 NOT DETECTED NOT DETECTED Final   Parainfluenza Virus 2 NOT DETECTED NOT DETECTED Final   Parainfluenza Virus 3 NOT DETECTED NOT DETECTED Final   Parainfluenza Virus 4 NOT DETECTED NOT DETECTED Final   Respiratory Syncytial Virus NOT DETECTED NOT DETECTED Final   Bordetella pertussis NOT DETECTED NOT DETECTED Final   Chlamydophila pneumoniae NOT DETECTED NOT DETECTED Final   Mycoplasma pneumoniae NOT DETECTED NOT DETECTED Final    Comment: Performed at [facility], [place] [address] [place], [place] [address]  MRSA PCR Screening     Status: None   Collection Time: 10/16/17  9:20 AM  Result Value Ref Range Status   MRSA by PCR NEGATIVE NEGATIVE Final    Comment:        The GeneXpert MRSA Assay (FDA approved for NASAL specimens only), is one component of a comprehensive MRSA colonization surveillance program. It is not intended to diagnose MRSA infection nor to guide or monitor treatment for MRSA infections. Performed at [facility], [place] [address], [place], [place] [address]    Chest x-ray  10/18/2017  IMPRESSION: Diffuse bilateral nodular opacities and airspace disease with small left effusion. No change.    By: Rolm Baptise M.D.   Michel Bickers, MD [facility] [phone] pager   [phone] cell 10/21/2017, 2:25 PM

## 2017-10-22 ENCOUNTER — Inpatient Hospital Stay: Payer: 59 | Attending: Oncology

## 2017-10-22 ENCOUNTER — Inpatient Hospital Stay (HOSPITAL_COMMUNITY): Payer: 59

## 2017-10-22 DIAGNOSIS — Z923 Personal history of irradiation: Secondary | ICD-10-CM | POA: Insufficient documentation

## 2017-10-22 DIAGNOSIS — D6959 Other secondary thrombocytopenia: Secondary | ICD-10-CM | POA: Insufficient documentation

## 2017-10-22 DIAGNOSIS — R0602 Shortness of breath: Secondary | ICD-10-CM | POA: Insufficient documentation

## 2017-10-22 DIAGNOSIS — Z9981 Dependence on supplemental oxygen: Secondary | ICD-10-CM | POA: Insufficient documentation

## 2017-10-22 DIAGNOSIS — R6520 Severe sepsis without septic shock: Secondary | ICD-10-CM

## 2017-10-22 DIAGNOSIS — C8448 Peripheral T-cell lymphoma, not classified, lymph nodes of multiple sites: Secondary | ICD-10-CM | POA: Insufficient documentation

## 2017-10-22 DIAGNOSIS — Z9221 Personal history of antineoplastic chemotherapy: Secondary | ICD-10-CM | POA: Insufficient documentation

## 2017-10-22 DIAGNOSIS — C8442 Peripheral T-cell lymphoma, not classified, intrathoracic lymph nodes: Secondary | ICD-10-CM

## 2017-10-22 DIAGNOSIS — I10 Essential (primary) hypertension: Secondary | ICD-10-CM | POA: Insufficient documentation

## 2017-10-22 LAB — COMPREHENSIVE METABOLIC PANEL
ALT: 51 U/L — AB (ref 0–44)
AST: 26 U/L (ref 15–41)
Albumin: 2 g/dL — ABNORMAL LOW (ref 3.5–5.0)
Alkaline Phosphatase: 44 U/L (ref 38–126)
Anion gap: 8 (ref 5–15)
BUN: 43 mg/dL — ABNORMAL HIGH (ref 6–20)
CALCIUM: 8.4 mg/dL — AB (ref 8.9–10.3)
CHLORIDE: 106 mmol/L (ref 98–111)
CO2: 27 mmol/L (ref 22–32)
CREATININE: 1.01 mg/dL (ref 0.61–1.24)
Glucose, Bld: 168 mg/dL — ABNORMAL HIGH (ref 70–99)
Potassium: 4.5 mmol/L (ref 3.5–5.1)
Sodium: 141 mmol/L (ref 135–145)
Total Bilirubin: 0.6 mg/dL (ref 0.3–1.2)
Total Protein: 4.8 g/dL — ABNORMAL LOW (ref 6.5–8.1)

## 2017-10-22 LAB — CBC WITH DIFFERENTIAL/PLATELET
BASOS PCT: 0 %
Basophils Absolute: 0 10*3/uL (ref 0.0–0.1)
EOS PCT: 0 %
Eosinophils Absolute: 0 10*3/uL (ref 0.0–0.7)
HCT: 23.3 % — ABNORMAL LOW (ref 39.0–52.0)
Hemoglobin: 7.8 g/dL — ABNORMAL LOW (ref 13.0–17.0)
Lymphocytes Relative: 3 %
Lymphs Abs: 0.3 10*3/uL (ref 0.7–4.0)
MCH: 31.7 pg (ref 26.0–34.0)
MCHC: 33.5 g/dL (ref 30.0–36.0)
MCV: 94.7 fL (ref 78.0–100.0)
MONO ABS: 0.5 10*3/uL (ref 0.1–1.0)
MONOS PCT: 5 %
NEUTROS ABS: 9.5 10*3/uL (ref 1.7–7.7)
Neutrophils Relative %: 92 %
PLATELETS: 74 10*3/uL — AB (ref 150–400)
RBC: 2.46 MIL/uL — ABNORMAL LOW (ref 4.22–5.81)
RDW: 15.4 % (ref 11.5–15.5)
WBC: 10.3 10*3/uL (ref 4.0–10.5)

## 2017-10-22 LAB — GLUCOSE, CAPILLARY
GLUCOSE-CAPILLARY: 155 mg/dL — AB (ref 70–99)
GLUCOSE-CAPILLARY: 208 mg/dL — AB (ref 70–99)
GLUCOSE-CAPILLARY: 272 mg/dL — AB (ref 70–99)
GLUCOSE-CAPILLARY: 320 mg/dL — AB (ref 70–99)
Glucose-Capillary: 219 mg/dL — ABNORMAL HIGH (ref 70–99)
Glucose-Capillary: 274 mg/dL — ABNORMAL HIGH (ref 70–99)

## 2017-10-22 MED ORDER — PEGFILGRASTIM-CBQV 6 MG/0.6ML ~~LOC~~ SOSY
PREFILLED_SYRINGE | SUBCUTANEOUS | Status: AC
  Filled 2017-10-22: qty 0.6

## 2017-10-22 MED ORDER — POTASSIUM CHLORIDE 2 MEQ/ML IV SOLN
Freq: Once | INTRAVENOUS | Status: AC
  Administered 2017-10-22: 09:00:00 via INTRAVENOUS
  Filled 2017-10-22: qty 10

## 2017-10-22 MED ORDER — BOOST PLUS PO LIQD
237.0000 mL | Freq: Three times a day (TID) | ORAL | Status: DC
  Administered 2017-10-22 – 2017-10-28 (×6): 237 mL via ORAL
  Filled 2017-10-22 (×18): qty 237

## 2017-10-22 MED ORDER — SODIUM CHLORIDE 0.9 % IV SOLN
Freq: Once | INTRAVENOUS | Status: AC
  Administered 2017-10-24: 40 mg via INTRAVENOUS
  Filled 2017-10-22: qty 4

## 2017-10-22 MED ORDER — CHLORHEXIDINE GLUCONATE CLOTH 2 % EX PADS
6.0000 | MEDICATED_PAD | Freq: Every day | CUTANEOUS | Status: DC
  Administered 2017-10-22 – 2017-11-02 (×5): 6 via TOPICAL

## 2017-10-22 MED ORDER — SODIUM CHLORIDE 0.9 % IV SOLN
Freq: Once | INTRAVENOUS | Status: AC
  Administered 2017-10-23: 16 mg via INTRAVENOUS
  Filled 2017-10-22: qty 8

## 2017-10-22 MED ORDER — POTASSIUM CHLORIDE 2 MEQ/ML IV SOLN
Freq: Once | INTRAVENOUS | Status: DC
  Filled 2017-10-22: qty 10

## 2017-10-22 MED ORDER — DEXAMETHASONE SODIUM PHOSPHATE 4 MG/ML IJ SOLN: 40.0000 mg | Freq: Once | INTRAMUSCULAR | Status: DC

## 2017-10-22 MED ORDER — SODIUM CHLORIDE 0.9 % IV SOLN
1.4000 g/m2 | Freq: Two times a day (BID) | INTRAVENOUS | Status: AC
  Administered 2017-10-22 – 2017-10-23 (×2): 2720 mg via INTRAVENOUS
  Filled 2017-10-22 (×4): qty 27.2

## 2017-10-22 MED ORDER — SODIUM CHLORIDE 0.9 % IV SOLN
Freq: Once | INTRAVENOUS | Status: AC
  Administered 2017-10-22: 56 mg via INTRAVENOUS
  Filled 2017-10-22: qty 8

## 2017-10-22 MED ORDER — COLD PACK MISC ONCOLOGY
1.0000 | Freq: Once | Status: DC | PRN
  Filled 2017-10-22: qty 1

## 2017-10-22 MED ORDER — DEXAMETHASONE 0.1 % OP SUSP
2.0000 [drp] | Freq: Four times a day (QID) | OPHTHALMIC | Status: AC
  Administered 2017-10-22 – 2017-10-26 (×16): 2 [drp] via OPHTHALMIC
  Filled 2017-10-22: qty 5

## 2017-10-22 NOTE — Progress Notes (Signed)
Pt wore bipap continuously overnight.  Rapid O2 desaturation events noted down to 70s each time bipap mask was removed for pt to drink per family's insistence.  Fio2 increased overnight from 90% to 100% on bipap for o2 sats >=90%.  Pt remains on bipap 100% fio2, HR71, rr33, spo2 93%.

## 2017-10-22 NOTE — Progress Notes (Signed)
Initial Nutrition Assessment  DOCUMENTATION CODES:   Not applicable  INTERVENTION:  Boost Plus TID   NUTRITION DIAGNOSIS:   Inadequate oral intake related to cancer and cancer related treatments(T-cell lymphoma; lung mets) as evidenced by other (comment)(FL diet x 3 days).    GOAL:   Patient will meet greater than or equal to 90% of their needs   MONITOR:   PO intake, Supplement acceptance, Diet advancement, Weight trends, Labs  REASON FOR ASSESSMENT:   Malnutrition Screening Tool    ASSESSMENT:  Pt w/ T-cell lymphoma, lung mets admitted 9/22 w/ neutropenic fever and hypoxic respiratory failure. Pt tolerating on/off BiPap trials  w/ HHFNC. Last chemo treatment 2 weeks ago -gemcitabine and cisplatin. Granix started 9/23 has been discontinued.   Pt sleeping and on BiPap at visit. Unable to speak to pt or perform NFPE  NUTRITION - FOCUSED PHYSICAL EXAM: unable to perform    Diet Order:   Diet Order            Diet full liquid Room service appropriate? Yes; Fluid consistency: Thin  Diet effective now              EDUCATION NEEDS:   Not appropriate for education at this time  Skin:  Skin Assessment: Reviewed RN Assessment  Last BM:  9/28; type 4  Height:   Ht Readings from Last 1 Encounters:  10/13/17 5\' 8"  (1.727 m)    Weight:   Wt Readings from Last 1 Encounters:  10/13/17 78.3 kg  10/07/17 79.3kg 09/30/17 82.5kg 09/19/17 86.1kg 08/15/17 85.5kg  Ideal Body Weight:  70 kg (154lb)  BMI:  Body mass index is 26.24 kg/m.  Estimated Nutritional Needs:   Kcal:  3832-9191 (25-30kcal/kg)  Protein:  95-118 (1.2-1.5g/kg)  Fluid:  2.3L    Lajuan Lines, RD, LDN  After Hours/Weekend Pager: [phone]

## 2017-10-22 NOTE — Progress Notes (Signed)
[facility] will sign off and PCCM to take over, appreciate their assistance.    Hosie Poisson, MD [phone]

## 2017-10-22 NOTE — Progress Notes (Addendum)
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  Admitted on 9/22 with neutropenic fever, started on broad-spectrum antibiotics. Critical care consulted 9/25 for acute on chronic hypoxic respiratory failure  Significant Hospital Events    9/23: Infectious disease consulted.  Had developed mild worsening and chronic cough and shortness of breath recommended to continue vancomycin and cefepime possible pneumonia.  Was given Granix per hematology with plans to continue this until Diamond was over 1000.  Chest x-ray with diffuse bilateral pulmonary infiltrates chronic in nature since late Aug 2019, 9/24: Having intermittent fever, cough a little bit worse.  One episode of nausea vomiting after forceful cough.  Room air oxygenation at 77% 9/25: Increased tachypnea.  Fluid challenge administered earlier.  Given IV Lasix.  Chest x-ray obtained showed progressive left lower lobe airspace disease, and persistent bilateral patchy airspace disease, oxygen requirements up to 7 L, CT chest negative for pulmonary emboli.  Moved to stepdown unit, trickle care asked to see for acute on chronic respiratory failure.  9/26: feels better. Still on high FIO2 cough and fever improved. Over all looking better. Neg almost 2 liters over 24 hrs but still > 4 liters + 9/27 still febrile still w/ high FIO2 needs systemic steroids started 9/27 through 9/30: Antibiotics continued.  Still requiring high flow oxygen and BiPAP alternating.  Aspergillosis antibody sent.  Steroid dosing increased on the 28th.  On the 30th still very hypoxic rapidly desaturating.  Case was discussed with oncology at [facility] as well as [facility], the consensus was to proceed with DHAP chemotherapy, this was toc be initiated at reduced therapy due to recent hematologic toxicity 10/1 Tolerating on/off BIPAP with HHFNC   Consults: date of consult/date signed off & final recs:  Pulmonary consulted 9/25  Procedures (surgical and bedside):    Significant Diagnostic Tests:  Echocardiogram from 8/31: Ejection fraction 55 to 60% wall Motion was normal diastolic function normal wall CT angiogram 9/25: Negative for pulmonary edema.  Increased mediastinal adenopathy, hilar adenopathy, and central groundglass opacities.  Also has left pleural effusion.  This looks small to moderate in size.  Pulmonary masses and right axillary adenopathy are stable.  Increased consolidation left lower lobe.  Micro Data: Respiratory virus panel 9/24: Negative Blood cultures obtained 9/23:>>> Urine culture 9/22: Negative  Antimicrobials:  Aztreonam 9/22 through 9/23 Cefepime 9/23>>>9/30 Metronidazole 9/22 through 9/23 Vancomycin 9/22>>>9/30  Subjective:  Doing well. Slept some. Issues with sats overnight. Family anxious at bedside this morning.   Objective   Blood pressure (!) 130/91, pulse 71, temperature 98.2 F (36.8 C), resp. rate (!) 28, height 5\' 8"  (1.727 m), weight 78.3 kg, SpO2 (!) 88 %.    FiO2 (%):  [100 %] 100 %   Intake/Output Summary (Last 24 hours) at 10/22/2017 0842 Last data filed at 10/22/2017 0800 Gross per 24 hour  Intake 1408.36 ml  Output 1540 ml  Net -131.64 ml   Filed Weights   10/13/17 1907 10/13/17 2121  Weight: 79.4 kg 78.3 kg    Examination: General appearance: 57 y.o., male, comfortable on BIPAP  Eyes: anicteric sclerae, moist conjunctivae; tracking appropriately  HENT: NCAT; oropharynx, MM dry, possible thrush  Neck: Trachea midline; no JVD  Lungs: diminished BL bases, no crackles, no wheeze CV: RRR, S1, S2, no MRGs  Abdomen: Soft, non-tender; non-distended, BS present  Extremities: trace edema, not  much, some dependent edema in legs  Skin: Normal temperature, turgor and texture; no rash Psych: Appropriate affect, no active delirium  Neuro: Alert and oriented to person and place, no focal deficit   Resolved Hospital Problem list     Assessment & Plan:   Acute on chronic hypoxic respiratory failure in the setting of diffuse pulmonary nodular infiltrates due to lymphoma involvement of the lung - unclear etiology of these nodular infiltrates, we are assuming this is related to his lymphoma - I believe that bronchoscopy in him at this point would led to mechanical ventilation and the family is not ready for this nor is the patient.  - this could represent an opportunistic infectious process due to his ongoing disease processes  Plan Abx discontinued by ID  We will continue to follow him off antimicrobials, would consider anti-fungal ppx following cytotoxic chemo and potentially anti-virals. This was discussed with Dr. Ammie Dalton his oncologist  We will increase his EPAP today and attempt to wean the FiO2, Hyperoxia is not good either  Attempt EPAP 8 of greater if he tolerates  Maintain SpO2>88%  Remains in the ICU for close hemodynamic and respiratory support  Impending resp failure could require intubation and mechanical ventilation   Pancytopenia. W/ febrile neutropenia  Last chemotherapy approximately 2 weeks ago.  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     Diet: regular  Pain/Anxiety/Delirium protocol (if indicated): Not indicated VAP protocol (if indicated): Not indicated DVT prophylaxis: [place] LMWH GI prophylaxis: Not indicated Hyperglycemia protocol: Not indicated Mobility: Bedrest Code Status: Full code Family Communication: Family updated  Labs   CBC: Recent Labs  Lab 10/17/17 0602 10/18/17 0634 10/20/17 0509 10/21/17 0359 10/22/17 0415  WBC 1.2* 4.7 14.7* 13.7* 10.3  NEUTROABS 0.8 4.0 14.3* 12.4* 9.5  HGB 7.5* 7.9* 8.2* 8.2* 7.8*  HCT 21.5* 22.6* 23.5* 23.7* 23.3*  MCV 92.3 91.9 91.8 93.3 94.7  PLT 64* 69* 82* 78* 74*    Basic Metabolic Panel: Recent Labs  Lab 10/16/17 0349 10/17/17 0602 10/18/17 0634 10/19/17 0500 10/20/17 0509 10/21/17 0359 10/22/17 0415  NA 142 141 141 139 141 143 141  K 3.8 3.6 3.7 3.4* 3.8 4.1 4.5  CL 111 106 104 102 104 105 106  CO2 24 26 26 26 26 28 27   GLUCOSE 137* 125* 167* 136* 208* 182* 168*  BUN 12 22* 34* 37* 48* 47* 43*  CREATININE 0.83 0.77 1.13 1.03 1.10 1.03 1.01  CALCIUM 8.0*  8.3* 8.4* 8.6* 8.9 9.0 8.4*  MG 1.8 1.7 1.8 1.8  --   --   --    GFR: Estimated Creatinine Clearance: 78.1 mL/min (by C-G formula based on SCr of 1.01 mg/dL). Recent Labs  Lab 10/17/17 1436 10/18/17 0634 10/19/17 0500 10/20/17 0509 10/21/17 0359 10/22/17 0415  PROCALCITON 0.36 0.47 0.70  --   --   --   WBC  --  4.7  --  14.7* 13.7* 10.3    Liver Function Tests: Recent Labs  Lab 10/20/17 0509 10/21/17 0359 10/22/17 0415  AST 25 58* 26  ALT 32 69* 51*  ALKPHOS 48 48 44  BILITOT 0.7 0.7 0.6  PROT 5.3* 5.1* 4.8*  ALBUMIN 2.2* 2.2* 2.0*   No results for input(s): LIPASE, AMYLASE in the last 168 hours. No results for input(s): AMMONIA in the last 168  hours.  ABG    Component Value Date/Time   PHART 7.418 10/16/2017 0802   PCO2ART 34.8 10/16/2017 0802   PO2ART 36.3 (LL) 10/16/2017 0802   HCO3 22.0 10/16/2017 0802   ACIDBASEDEF 1.5 10/16/2017 0802   O2SAT 62.9 10/16/2017 0802     Coagulation Profile: No results for input(s): INR, PROTIME in the last 168 hours.  Cardiac Enzymes: No results for input(s): CKTOTAL, CKMB, CKMBINDEX, TROPONINI in the last 168 hours.  HbA1C: No results found for: HGBA1C  CBG: Recent Labs  Lab 10/21/17 0358 10/21/17 1219 10/21/17 2011 10/21/17 2327 10/22/17 0301  GLUCAP 165* 162* 282* 224* 155*    This patient is critically ill with multiple organ system failure; which, requires frequent high complexity decision making, assessment, support, evaluation, and titration of therapies. This was completed through the application of advanced monitoring technologies and extensive interpretation of multiple databases. During this encounter critical care time was devoted to patient care services described in this note for 45 minutes.    Garner Nash, DO [facility] 10/22/2017 8:42 AM  Personal pager: [phone] If unanswered, please page CCM On-call: [phone]

## 2017-10-22 NOTE — Progress Notes (Signed)
IP PROGRESS NOTE  Subjective:   [name] appears unchanged.  His family is at the bedside.  No new complaint.  No nausea.  Objective: Vital signs in last 24 hours: Blood pressure (!) 130/91, pulse 74, temperature 98.2 F (36.8 C), resp. rate (!) 29, height '5\' 8"'$  (1.727 m), weight 172 lb 9.6 oz (78.3 kg), SpO2 93 %.  Intake/Output from previous day: 09/30 0701 - 10/01 0700 In: 1610.2 [I.V.:127.7; IV Piggyback:1482.5] Out: 4034 [Urine:1355]  Physical Exam:  HEENT: BiPAP mask in place Lymph nodes: The right axillary lymph node is smaller Abdomen: No hepatosplenomegaly, nontender Extremities: No leg edema   Portacath/PICC-without erythema  Lab Results: Recent Labs    10/21/17 0359 10/22/17 0415  WBC 13.7* 10.3  HGB 8.2* 7.8*  HCT 23.7* 23.3*  PLT 78* 74*    BMET Recent Labs    10/21/17 0359 10/22/17 0415  NA 143 141  K 4.1 4.5  CL 105 106  CO2 28 27  GLUCOSE 182* 168*  BUN 47* 43*  CREATININE 1.03 1.01  CALCIUM 9.0 8.4*    Studies/Results: No results found.  Medications: I have reviewed the patient's current medications.  Assessment/Plan:  1. 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  PET scan 03/20/2016 with extensive bulky hypermetabolic nodal activity in the neck, chest, abdomen and pelvis; mild splenomegaly with diffusely mildly accentuated splenic activity  Staging bone marrow biopsy 03/23/2016-negative for involvement with lymphoma  Cycle 1 EPOCH beginning 03/23/2016  Cycle 2 [facility] 04/13/2016  Restaging PET scan at [facility] 05/08/2016-lymph nodes  with various degrees of FDG activity in the neck, axilla, right written him, mesentery, pelvis, and groin. Indeterminate liver lesions without FDG activity, nodular mass in the posterior medial aspect of the left thigh  Cycle 3 EPOCH04/27/2018  Cycle 1 CVP 06/21/2016  Cytoxan/prednisone plus brentuximab 07/17/2016  Cytoxan/prednisone plus brentuximab 08/07/2016 (dose reduced)  Initiation of every three-week brentuximab08/08/2016  brentuximabdose reduced 10/31/2016 secondary to neuropathy  Brentuximab further dose reduced 01/03/2017 secondary to neuropathy  Presentation with parietal scalp nodular lesion 03/08/2017  Staging PET scan 7/42/[id number] hypermetabolic lymphadenopathy in the neck, chest, abdomen/pelvis, and hypermetabolic cutaneous lesions  03/22/2017 CT biopsy left retroperitoneal nodal mass-recurrent CD30 positive T-cell lymphoma  Cycle 1 bendamustine/CPI-613on clinical trial at [facility] 04/08/2017  CTs 05/22/2017 After 2 cycles-mixed response: Enlargement of left supraclavicular node, right axillary node, right pelvic sidewall mass, decreased retroperitoneal abdominal adenopathy  Cycle 3 bendamustine/CPI-613 06/03/2017  Cycle 4 bendamustine/CPI-613 07/09/2017  CT 08/05/2017- increase in the size of a level to a lymph node, decreased size of level 3 and supraclavicular nodes, enlargement of right axillary nodes, new bilateral pulmonary nodules, decreased mediastinal, rectal peritoneal, and iliac nodes  Cycle 5 bendamustine/CPI-6137/16/2019  Cycle 6 bendamustine/CPI-613 09/02/2017  Cycle 1 cisplatin/gemcitabine 09/30/2017 by day 8 gemcitabine held secondary to neutropenia/thrombocytopenia  Cycle 1 DHAP 10/21/2017 2. 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 He entered clinical remission following chemotherapy and remained in remission when he was seen at the  cancer center 02/24/2009. 3. 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 Staging PET scan 08/23/2010 with increased FDG activity at the left tonsillar fossa and no additional evidence of lymphoma. He completed 4 cycles of R-ICE with cycle #1 beginning on 09/19/2010 and cycle #4 on 11/21/2010. Repeat head and neck examination by Dr. Constance Holster following R-ICE/rituximab showed no residual lymphoma. He began radiation consolidation on 12/18/2010, radiation was completed on 01/22/2011. 4. History of neutropenia secondary to rituximab. 5. Anemia secondary to chemotherapy, status post a red blood cell transfusion 11/30/2010. The hemoglobin has normalized. 6. History of mild thrombocytopenia secondary to chemotherapy. 7. Hypertension. 8. 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 05/08/2017-nodular mass in the left thigh adjacent to the femur  MRI 06/07/2016-infiltrative mass in the left thigh adductor muscle, separate from the schwannoma excision site  MRI 10/08/2016-marked improvement in the left adductormusclemass with a small area of enhancement remaining, no discrete mass 9. Port-A-Cath placement 03/21/2016 10. Superficial thrombus left greater saphenous vein 04/13/2016. Lovenox initiated, converted to Xarelto beginning 04/18/2016 11. 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 Admission 09/20/2017 with cough/fever/dyspnea-CT concerning for progression of lymphoma in the lungs versus pneumoniatreated with vancomycin/cefepime, started Levaquin 09/25/2017 13.  Admission 10/13/2017 with fever in the setting of severe neutropenia, no source for infection identified other than possible pneumonia 14.   Respiratory failure- most  likely secondary to progression of non-Hodgkin's lymphoma in the lungs       [name] appears stable.  He continues to require high flow oxygen and BiPAP support.  He tolerated the cisplatin well.  The plan is to proceed with day 2 cytarabine today.  I discussed the case with pulmonary medicine.  They will continue continue pressure and oxygen support with the plan for intubation if needed.    Recommendations: 1.  Proceed with day 2 cytarabine today. 2.  Management of respiratory failure per critical care medicine 3.  Continue intravenous hydration for 12 additional hours 4.  Begin G-CSF tomorrow    LOS: 9 days   [name], MD   10/22/2017, 7:30 AM

## 2017-10-23 DIAGNOSIS — D899 Disorder involving the immune mechanism, unspecified: Secondary | ICD-10-CM

## 2017-10-23 DIAGNOSIS — R0602 Shortness of breath: Secondary | ICD-10-CM

## 2017-10-23 DIAGNOSIS — C8590 Non-Hodgkin lymphoma, unspecified, unspecified site: Secondary | ICD-10-CM

## 2017-10-23 LAB — GLUCOSE, CAPILLARY
GLUCOSE-CAPILLARY: 152 mg/dL — AB (ref 70–99)
GLUCOSE-CAPILLARY: 227 mg/dL — AB (ref 70–99)
Glucose-Capillary: 155 mg/dL — ABNORMAL HIGH (ref 70–99)
Glucose-Capillary: 204 mg/dL — ABNORMAL HIGH (ref 70–99)
Glucose-Capillary: 230 mg/dL — ABNORMAL HIGH (ref 70–99)
Glucose-Capillary: 217 mg/dL — ABNORMAL HIGH (ref 70–99)

## 2017-10-23 MED ORDER — FUROSEMIDE 10 MG/ML IJ SOLN
20.0000 mg | Freq: Two times a day (BID) | INTRAMUSCULAR | Status: AC
  Administered 2017-10-23 – 2017-10-24 (×3): 20 mg via INTRAVENOUS
  Filled 2017-10-23 (×3): qty 2

## 2017-10-23 NOTE — Progress Notes (Signed)
IP PROGRESS NOTE  Subjective:   [name] reports having a better night.  No new complaint.  No nausea.  Objective: Vital signs in last 24 hours: Blood pressure 133/81, pulse 67, temperature (!) 97.5 F (36.4 C), resp. rate (!) 23, height '5\' 8"'$  (1.727 m), weight 172 lb 9.6 oz (78.3 kg), SpO2 (!) 89 %.  Intake/Output from previous day: 10/01 0701 - 10/02 0700 In: 2221 [P.O.:477; I.V.:900; IV Piggyback:844] Out: 650 [Urine:650]  Physical Exam:  HEENT: BiPAP mask in place, thrush has improved, minimal remaining thrush over the posterior palate Cardiac: Regular rate and rhythm Lungs: Good air movement bilaterally, decreased breath sounds at the left lower chest, lungs clear posteriorly Lymph nodes: The right axillary lymph node is stable Abdomen: No hepatosplenomegaly, nontender Extremities: No leg edema   Portacath/PICC-without erythema  Lab Results: Recent Labs    10/21/17 0359 10/22/17 0415  WBC 13.7* 10.3  HGB 8.2* 7.8*  HCT 23.7* 23.3*  PLT 78* 74*    BMET Recent Labs    10/21/17 0359 10/22/17 0415  NA 143 141  K 4.1 4.5  CL 105 106  CO2 28 27  GLUCOSE 182* 168*  BUN 47* 43*  CREATININE 1.03 1.01  CALCIUM 9.0 8.4*    Studies/Results: Dg Chest Port 1 View  Result Date: 10/22/2017 CLINICAL DATA:  Acute respiratory failure EXAM: PORTABLE CHEST 1 VIEW COMPARISON:  Chest x-ray of October 20, 2017 FINDINGS: The lungs are adequately inflated. Confluent airspace opacities have decreased in conspicuity but not completely cleared. The left hemidiaphragm remains obscured. There is a small left pleural effusion. The heart is not enlarged. The pulmonary vascularity is indistinct. The power port catheter tip projects over the junction of the middle and distal thirds of the SVC. There is multilevel right lateral endplate spurring of the thoracic spine. IMPRESSION: Mild interval decrease in the conspicuity of bilateral airspace opacities consistent with improving bilateral  pneumonia. Electronically Signed   By: David  Martinique M.D.   On: 10/22/2017 09:29    Medications: I have reviewed the patient's current medications.  Assessment/Plan:  1. 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  PET scan 03/20/2016 with extensive bulky hypermetabolic nodal activity in the neck, chest, abdomen and pelvis; mild splenomegaly with diffusely mildly accentuated splenic activity  Staging bone marrow biopsy 03/23/2016-negative for involvement with lymphoma  Cycle 1 EPOCH beginning 03/23/2016  Cycle 2 [facility] 04/13/2016  Restaging PET scan at [facility] 05/08/2016-lymph nodes with various degrees of FDG activity in the neck, axilla, right written him, mesentery, pelvis, and groin. Indeterminate liver lesions without FDG activity, nodular mass in the posterior medial aspect of the left thigh  Cycle 3 EPOCH04/27/2018  Cycle 1 CVP 06/21/2016  Cytoxan/prednisone plus brentuximab 07/17/2016  Cytoxan/prednisone plus brentuximab 08/07/2016 (dose reduced)  Initiation of every three-week brentuximab08/08/2016  brentuximabdose reduced 10/31/2016 secondary to neuropathy  Brentuximab further dose reduced 01/03/2017 secondary to neuropathy  Presentation with parietal scalp nodular lesion 03/08/2017  Staging PET scan 1/61/0960-[id number] hypermetabolic lymphadenopathy in the neck, chest, abdomen/pelvis, and hypermetabolic cutaneous lesions  03/22/2017 CT biopsy left retroperitoneal nodal mass-recurrent CD30 positive T-cell lymphoma  Cycle 1 bendamustine/CPI-613on clinical trial at [facility] 04/08/2017  CTs 05/22/2017  After 2 cycles-mixed response: Enlargement of left supraclavicular node, right axillary node, right pelvic sidewall mass, decreased retroperitoneal abdominal adenopathy  Cycle 3 bendamustine/CPI-613 06/03/2017  Cycle 4 bendamustine/CPI-613 07/09/2017  CT 08/05/2017- increase in the size of a level to a lymph node, decreased size of level 3 and supraclavicular nodes, enlargement of right axillary nodes, new bilateral pulmonary nodules, decreased mediastinal, rectal peritoneal, and iliac nodes  Cycle 5 bendamustine/CPI-6137/16/2019  Cycle 6 bendamustine/CPI-613 09/02/2017  Cycle 1 cisplatin/gemcitabine 09/30/2017 by day 8 gemcitabine held secondary to neutropenia/thrombocytopenia  Cycle 1 DHAP 10/21/2017 2. 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 He entered clinical remission following chemotherapy and remained in remission when he was seen at the cancer center 02/24/2009. 3. 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 Staging PET scan 08/23/2010 with increased FDG activity at the left tonsillar fossa and no additional evidence of lymphoma. He completed 4 cycles of R-ICE with cycle #1 beginning on 09/19/2010 and cycle #4 on 11/21/2010. Repeat head and neck examination by Dr. Constance Holster following R-ICE/rituximab showed no residual lymphoma. He began radiation consolidation on 12/18/2010, radiation was completed on 01/22/2011. 4. History of neutropenia secondary to rituximab. 5. Anemia secondary to chemotherapy, status post a red blood cell transfusion 11/30/2010. The hemoglobin has normalized. 6. History of mild thrombocytopenia secondary to chemotherapy. 7. Hypertension. 8. Left thigh mass-status post surgical excision [facility]  confirming a schwannoma  PET scan at [facility] 05/08/2017-nodular mass in the left thigh adjacent to the femur  MRI 06/07/2016-infiltrative mass in the left thigh adductor muscle, separate from the schwannoma excision site  MRI 10/08/2016-marked improvement in the left adductormusclemass with a small area of enhancement remaining, no discrete mass 9. Port-A-Cath placement 03/21/2016 10. Superficial thrombus left greater saphenous vein 04/13/2016. Lovenox initiated, converted to Xarelto beginning 04/18/2016 11. 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 Admission 09/20/2017 with cough/fever/dyspnea-CT concerning for progression of lymphoma in the lungs versus pneumoniatreated with vancomycin/cefepime, started Levaquin 09/25/2017 13.  Admission 10/13/2017 with fever in the setting of severe neutropenia, no source for infection identified other than possible pneumonia 14.   Respiratory failure- most likely secondary to progression of non-Hodgkin's lymphoma in the lungs       [name] appears more comfortable today.  His oxygenation appears slightly improved.  He is tolerating the chemotherapy well. He is completing the second dose of cytarabine this morning.  The plan is to begin G-CSF tomorrow.  He will complete a final dose of high-dose Decadron tomorrow, we will then continue Decadron at a lower dose.   Recommendations: 1.  Complete second dose of cytarabine today 2.  Continue management of respiratory failure per critical care medicine 3.  Continue daily Decadron 4.  Begin G-CSF tomorrow    LOS: 10 days   [name], MD   10/23/2017, 6:22 AM

## 2017-10-23 NOTE — Care Management Note (Signed)
Case Management Note  Patient Details  Name: [name] MRN: [id number] Date of Birth: [birth year]  Subjective/Objective:                  Continues on hfnc at 100%,Iv decadron, iv zofran, iv ns  Action/Plan: Will follow for progression of care and clinical status. Will follow for case management needs none present at this time.  Expected Discharge Date:  10/15/17               Expected Discharge Plan:  Home/Self Care  In-House Referral:     Discharge planning Services  CM Consult  Post Acute Care Choice:    Choice offered to:     DME Arranged:    DME Agency:     HH Arranged:    HH Agency:     Status of Service:  In process, will continue to follow  If discussed at Long Length of Stay Meetings, dates discussed:    Additional Comments:  Leeroy Cha, RN 10/23/2017, 10:08 AM

## 2017-10-23 NOTE — Progress Notes (Addendum)
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  Admitted on 9/22 with neutropenic fever, started on broad-spectrum antibiotics. Critical care consulted 9/25 for acute on chronic hypoxic respiratory failure  Significant Hospital Events    9/23: Infectious disease consulted.  Had developed mild worsening and chronic cough and shortness of breath recommended to continue vancomycin and cefepime possible pneumonia.  Was given Granix per hematology with plans to continue this until [place] was over 1000.  Chest x-ray with diffuse bilateral pulmonary infiltrates chronic in nature since late Aug 2019, 9/24: Having intermittent fever, cough a little bit worse.  One episode of nausea vomiting after forceful cough.  Room air oxygenation at 77% 9/25: Increased tachypnea.  Fluid challenge administered earlier.  Given IV Lasix.  Chest x-ray obtained showed progressive left lower lobe airspace disease, and persistent bilateral patchy airspace disease, oxygen requirements up to 7 L, CT chest negative for pulmonary emboli.  Moved to stepdown unit, trickle care asked to see for acute on chronic respiratory failure.  9/26: feels better. Still on high FIO2 cough and fever improved. Over all looking better. Neg almost 2 liters over 24 hrs but still > 4 liters + 9/27 still febrile still w/ high FIO2 needs systemic steroids started 9/27 through 9/30: Antibiotics continued.  Still requiring high flow oxygen and BiPAP alternating.  Aspergillosis antibody sent.  Steroid dosing increased on the 28th.  On the 30th still very hypoxic rapidly desaturating.  Case was discussed with oncology at [facility] as well as [facility], the consensus was to proceed with DHAP chemotherapy, this was toc be initiated at reduced therapy due to recent hematologic toxicity 10/1, 10/2 - Tolerating on/off BIPAP with HHFNC   Consults: date of consult/date signed off & final recs:  Pulmonary consulted 9/25  Procedures (surgical and bedside):    Significant Diagnostic Tests:  Echocardiogram from 8/31: Ejection fraction 55 to 60% wall Motion was normal diastolic function normal wall CT angiogram 9/25: Negative for pulmonary edema.  Increased mediastinal adenopathy, hilar adenopathy, and central groundglass opacities.  Also has left pleural effusion.  This looks small to moderate in size.  Pulmonary masses and right axillary adenopathy are stable.  Increased consolidation left lower lobe.  Micro Data: Respiratory virus panel 9/24: Negative Blood cultures obtained 9/23:>>> Urine culture 9/22: Negative  Antimicrobials:  Aztreonam 9/22 through 9/23 Cefepime 9/23>>>9/30 Metronidazole 9/22 through 9/23 Vancomycin 9/22>>>9/30  Subjective:  Did well overnight. Family happy regarding progression. I spoke with Dr. [name] again this morning. Also, please with progression. Patient states that he feels better. He feels less SOB.   Objective   Blood pressure (!) 158/100, pulse 70, temperature (!) 97.3 F (36.3 C), resp. rate (!) 27, height 5\' 8"  (1.727 m), weight 78.3 kg, SpO2 92 %.    FiO2 (%):  [75 %-100 %] 75 %   Intake/Output Summary (Last 24 hours) at 10/23/2017 0806 Last data filed at 10/23/2017 0700 Gross per 24 hour  Intake 2169 ml  Output 1400 ml  Net 769 ml   Filed Weights   10/13/17 1907 10/13/17 2121  Weight: 79.4 kg 78.3 kg    Examination: General appearance: 57 y.o., male, on BIPAP,  comfortable, able to communicate in 4-5 word sentences  Eyes: anicteric sclerae, moist conjunctivae; tracking HENT: NCAT; oropharynx, MM dry  Neck: Trachea midline; no jvd  Lungs: diminshed bilaterally , no crackles, no  wheeze, CV: RRR, S1, S2, no MRGs  Abdomen: Soft, non-tender; non-distended, BS present  Extremities: trace edema, in BL LE  Skin: Normal temperature, turgor and texture; scattered petechia  Psych: Appropriate affect, normal mood  Neuro: Alert and oriented to person and place  Resolved Hospital Problem list     Assessment & Plan:   Acute on chronic hypoxic respiratory failure in the setting of diffuse pulmonary nodular infiltrates due to lymphoma involvement of the lung - unclear etiology of these nodular infiltrates, we are assuming this is related to his lymphoma - I believe that bronchoscopy in him at this point would led to mechanical ventilation and the family is not ready for this nor is the patient.  - this could represent an opportunistic infectious process due to his ongoing disease processes  - review of the CXR from 10/1 does show improvement in the infiltrates, The patient's images have been independently reviewed by me.   Plan Holding Abx per ID  May need ppx antimicrobials following steady drop in white count  Will continue to observe  Slowly dropping EPAP  Trial off PAP more today, trying to avoid skin breakdown from face mask  Remains in the ICU for close respiratory and hemodynamic monitoring  Impending respiratory failure, at risk for need of intubation, critically ill   Pancytopenia. W/ febrile neutropenia  Last chemotherapy approximately 2 weeks ago.  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   History of superficial DVT Had been on Xarelto since 2018 Plan - continue chemical LMWH - follow platelets   Normal Serum Cr Positive CFB  -  will add low dose diuresis  - follow urine output   Disposition / Summary of Today's Plan 10/23/17   Weaning FiO2  Decreasing PAP support  Diuresis   >25 mins of the visit spent discussing care plan with family as well as oncology service. Plan regarding weaning from PAP therapy as well as starting diuresis was discussed with family and patient.     Diet: regular  Pain/Anxiety/Delirium protocol (if indicated): Not indicated VAP protocol (if indicated): Not indicated DVT prophylaxis: [place] LMWH GI prophylaxis: Not indicated Hyperglycemia protocol: Not indicated Mobility: Bedrest Code Status: Full code Family Communication: Family updated  Labs   CBC: Recent Labs  Lab 10/17/17 0602 10/18/17 0634 10/20/17 0509 10/21/17 0359 10/22/17 0415  WBC 1.2* 4.7 14.7* 13.7* 10.3  NEUTROABS 0.8 4.0 14.3* 12.4* 9.5  HGB 7.5* 7.9* 8.2* 8.2* 7.8*  HCT 21.5* 22.6* 23.5* 23.7* 23.3*  MCV 92.3 91.9 91.8 93.3 94.7  PLT 64* 69* 82* 78* 74*    Basic Metabolic Panel: Recent Labs  Lab 10/17/17 0602 10/18/17 0634 10/19/17 0500 10/20/17 0509 10/21/17 0359 10/22/17 0415  NA 141 141 139 141 143 141  K 3.6 3.7 3.4* 3.8 4.1 4.5  CL 106 104 102 104 105 106  CO2 26 26 26 26 28 27   GLUCOSE 125* 167* 136* 208* 182* 168*  BUN 22* 34* 37*  48* 47* 43*  CREATININE 0.77 1.13 1.03 1.10 1.03 1.01  CALCIUM 8.3* 8.4* 8.6* 8.9 9.0 8.4*  MG 1.7 1.8 1.8  --   --   --    GFR: Estimated Creatinine Clearance: 78.1 mL/min (by C-G formula based on SCr of 1.01 mg/dL). Recent Labs  Lab 10/17/17 1436 10/18/17 0634 10/19/17 0500 10/20/17 0509 10/21/17 0359 10/22/17 0415  PROCALCITON 0.36 0.47 0.70  --   --   --   WBC  --  4.7  --  14.7* 13.7* 10.3    Liver Function Tests: Recent Labs  Lab 10/20/17 0509 10/21/17 0359 10/22/17 0415  AST 25 58* 26  ALT 32 69* 51*  ALKPHOS 48 48 44  BILITOT 0.7 0.7 0.6  PROT 5.3* 5.1* 4.8*  ALBUMIN 2.2* 2.2* 2.0*   No results for input(s): LIPASE, AMYLASE in the  last 168 hours. No results for input(s): AMMONIA in the last 168 hours.  ABG    Component Value Date/Time   PHART 7.418 10/16/2017 0802   PCO2ART 34.8 10/16/2017 0802   PO2ART 36.3 (LL) 10/16/2017 0802   HCO3 22.0 10/16/2017 0802   ACIDBASEDEF 1.5 10/16/2017 0802   O2SAT 62.9 10/16/2017 0802     Coagulation Profile: No results for input(s): INR, PROTIME in the last 168 hours.  Cardiac Enzymes: No results for input(s): CKTOTAL, CKMB, CKMBINDEX, TROPONINI in the last 168 hours.  HbA1C: No results found for: HGBA1C  CBG: Recent Labs  Lab 10/22/17 1517 10/22/17 1932 10/22/17 2334 10/23/17 0333 10/23/17 0729  GLUCAP 274* 272* 208* 152* 155*    This patient is critically ill with multiple organ system failure; which, requires frequent high complexity decision making, assessment, support, evaluation, and titration of therapies. This was completed through the application of advanced monitoring technologies and extensive interpretation of multiple databases. During this encounter critical care time was devoted to patient care services described in this note for 36 minutes.   Garner Nash, DO [facility] 10/23/2017 8:06 AM  Personal pager: [phone] If unanswered, please page CCM On-call: [phone]

## 2017-10-24 DIAGNOSIS — C8442 Peripheral T-cell lymphoma, not classified, intrathoracic lymph nodes: Secondary | ICD-10-CM

## 2017-10-24 DIAGNOSIS — C8592 Non-Hodgkin lymphoma, unspecified, intrathoracic lymph nodes: Secondary | ICD-10-CM

## 2017-10-24 DIAGNOSIS — Z9989 Dependence on other enabling machines and devices: Secondary | ICD-10-CM

## 2017-10-24 LAB — COMPREHENSIVE METABOLIC PANEL
ALT: 41 U/L (ref 0–44)
AST: 29 U/L (ref 15–41)
Albumin: 2.1 g/dL — ABNORMAL LOW (ref 3.5–5.0)
Alkaline Phosphatase: 41 U/L (ref 38–126)
Anion gap: 14 (ref 5–15)
BUN: 78 mg/dL — ABNORMAL HIGH (ref 6–20)
CALCIUM: 8.3 mg/dL — AB (ref 8.9–10.3)
CO2: 25 mmol/L (ref 22–32)
CREATININE: 1.49 mg/dL — AB (ref 0.61–1.24)
Chloride: 101 mmol/L (ref 98–111)
GFR calc non Af Amer: 50 mL/min — ABNORMAL LOW (ref >60)
GFR, EST AFRICAN AMERICAN: 58 mL/min — AB (ref >60)
Glucose, Bld: 213 mg/dL — ABNORMAL HIGH (ref 70–99)
Potassium: 4.6 mmol/L (ref 3.5–5.1)
SODIUM: 140 mmol/L (ref 135–145)
TOTAL PROTEIN: 5.1 g/dL — AB (ref 6.5–8.1)
Total Bilirubin: 0.4 mg/dL (ref 0.3–1.2)

## 2017-10-24 LAB — ASPERGILLUS ANTIBODY BY IMMUNODIFF
ASPERGILLUS NIGER: NEGATIVE
Aspergillus flavus: NEGATIVE
Aspergillus fumigatus, IgG: NEGATIVE

## 2017-10-24 LAB — GLUCOSE, CAPILLARY
GLUCOSE-CAPILLARY: 177 mg/dL — AB (ref 70–99)
GLUCOSE-CAPILLARY: 171 mg/dL — AB (ref 70–99)
GLUCOSE-CAPILLARY: 286 mg/dL — AB (ref 70–99)
GLUCOSE-CAPILLARY: 346 mg/dL — AB (ref 70–99)
Glucose-Capillary: 253 mg/dL — ABNORMAL HIGH (ref 70–99)
Glucose-Capillary: 197 mg/dL — ABNORMAL HIGH (ref 70–99)
Glucose-Capillary: 277 mg/dL — ABNORMAL HIGH (ref 70–99)

## 2017-10-24 LAB — PHOSPHORUS: Phosphorus: 6 mg/dL — ABNORMAL HIGH (ref 2.5–4.6)

## 2017-10-24 LAB — MAGNESIUM: MAGNESIUM: 2.4 mg/dL (ref 1.7–2.4)

## 2017-10-24 MED ORDER — PREDNISONE 10 MG PO TABS: 10.0000 mg | ORAL_TABLET | Freq: Every day | ORAL | Status: DC

## 2017-10-24 MED ORDER — VITAMIN B-1 100 MG PO TABS
100.0000 mg | ORAL_TABLET | Freq: Every day | ORAL | Status: DC
  Administered 2017-10-24 – 2017-10-30 (×5): 100 mg via ORAL
  Filled 2017-10-24 (×8): qty 1

## 2017-10-24 MED ORDER — PROSIGHT PO TABS
1.0000 | ORAL_TABLET | Freq: Every day | ORAL | Status: DC
  Administered 2017-10-24 – 2017-10-27 (×4): 1 via ORAL
  Filled 2017-10-24 (×8): qty 1

## 2017-10-24 MED ORDER — PREDNISONE 20 MG PO TABS: 20.0000 mg | ORAL_TABLET | Freq: Every day | ORAL | Status: DC

## 2017-10-24 MED ORDER — PREDNISONE 5 MG PO TABS: 5.0000 mg | ORAL_TABLET | Freq: Every day | ORAL | Status: DC

## 2017-10-24 MED ORDER — PREDNISONE 20 MG PO TABS: 30.0000 mg | ORAL_TABLET | Freq: Every day | ORAL | Status: DC

## 2017-10-24 MED ORDER — ALUM & MAG HYDROXIDE-SIMETH 200-200-20 MG/5ML PO SUSP
30.0000 mL | Freq: Four times a day (QID) | ORAL | Status: DC | PRN
  Administered 2017-10-24 – 2017-10-29 (×4): 30 mL via ORAL
  Filled 2017-10-24 (×4): qty 30

## 2017-10-24 MED ORDER — PREDNISONE 20 MG PO TABS: 40.0000 mg | ORAL_TABLET | Freq: Every day | ORAL | Status: DC

## 2017-10-24 MED ORDER — TBO-FILGRASTIM 480 MCG/0.8ML ~~LOC~~ SOSY
480.0000 ug | PREFILLED_SYRINGE | Freq: Every day | SUBCUTANEOUS | Status: DC
  Administered 2017-10-24 – 2017-11-02 (×10): 480 ug via SUBCUTANEOUS
  Filled 2017-10-24 (×10): qty 0.8

## 2017-10-24 NOTE — Progress Notes (Signed)
This morning's HI DOSE ARA - C dose was verified upon delivery on 10/1 by  Lottie Dawson, RN.

## 2017-10-24 NOTE — Progress Notes (Signed)
Patient currently off BiPAP. RT will continue to monitor patient.  

## 2017-10-24 NOTE — Evaluation (Signed)
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 Admitted on 9/22 with neutropenic fever, started on broad-spectrum antibiotics. Critical care consulted 9/25 for acute on chronic hypoxic respiratory failure. Pt with nonhodgkins lymphoma dx in 2018. PMh includes essential tremor, diffuse large B cell lymphoma diagnosed 2005.   Clinical Impression   Pt presents with desats with mobility, difficulty performing bed mobility/transfers, altered sensation in bilateral feet negatively impacting balance, mild incoordination demonstrated on finger to nose test, generalized weakness of UE and LEs, and decreased activity tolerance. Pt to benefit from acute PT to address deficits. Pt able to stand pivot to chair today with min assist for steadying. RW used this session given imbalance, recommending pt be provided with RW for mobility. PT currently recommending HHPT, contingent on pt mobility progress. Will continue to follow acutely.   O2sats when changed from HFNC to BIPAP: 86% O2sats during transfer on BIPAP: 90-94% O2sats resting in chair for 2 minutes post-transfer, BIPAP: 98%    Follow Up Recommendations Supervision for mobility/OOB;Home health PT    Equipment Recommendations  Rolling walker with 5" wheels    Recommendations for Other Services       Precautions / Restrictions Precautions Precautions: Fall Precaution Comments: HFNC/BiPAP 45%, monitor O2sats during session. Neutropenic Restrictions Weight Bearing Restrictions: No      Mobility  Bed Mobility Overal bed mobility: Needs Assistance Bed Mobility: Supine to Sit     Supine to sit: Min guard;HOB elevated     General bed mobility comments: Prior to bed mobility, pt wanted to switch from high flow [place]  to BIPAP for increased O2 support, pt stating that sats drop below 90% with minimal movement. Sats dropped to 86% during mask exchange. Recovered after approximately 1 minute to low 90s. Min guard for safety, increased time and effort to complete. Upon sitting EOB, pt with sats at 89%, recovered to 94% with resting at EOB.   Transfers Overall transfer level: Needs assistance Equipment used: None;Rolling walker (2 wheeled) Transfers: Sit to/from [facility] Sit to Stand: Min assist;From elevated surface;+2 safety/equipment;+2 physical assistance Stand pivot transfers: Min assist;+2 safety/equipment;+2 physical assistance       General transfer comment: Attempted sit to stand without AD. Pt standing on heels and unable to balance self. Sat back down on bed, initiated use of RW. Pt with improved balance with use of RW due to UE support. Min assist for steadying to turn and sit in recliner at bedside. Pt with increased time and effort to get to chair. Sats maintained above 90% with move to chair. Pt satting at 98% after resting in chair for approximately 2 minutes.   Ambulation/Gait                Stairs            Wheelchair Mobility    Modified Rankin (Stroke Patients Only)       Balance Overall balance assessment: Needs assistance Sitting-balance support: Feet supported Sitting balance-Leahy Scale: Fair Sitting balance - Comments: sits on EOB with no UE support, unable to accept challenge      Standing balance-Leahy Scale: Poor Standing balance comment: relies on RW for UE support  Pertinent Vitals/Pain Pain Assessment: No/denies pain    Home Living Family/patient expects to be discharged to:: Private residence Living Arrangements: Spouse/significant other Available Help at Discharge: Family Type of Home: House Home Access: Level entry     Home Layout: Two level Home Equipment: None      Prior  Function Level of Independence: Independent               Hand Dominance   Dominant Hand: Right    Extremity/Trunk Assessment   Upper Extremity Assessment Upper Extremity Assessment: Generalized weakness    Lower Extremity Assessment Lower Extremity Assessment: Generalized weakness;RLE deficits/detail;LLE deficits/detail( able to perform hip and knee flexion, DF/PF in supine position with increased time) RLE Deficits / Details: Pt with LE twitching, pt reports due to prednisone.  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  LLE Sensation: history of peripheral neuropathy(due to chemotherapy)    Cervical / Trunk Assessment Cervical / Trunk Assessment: Normal  Communication   Communication: No difficulties  Cognition Arousal/Alertness: Awake/alert Behavior During Therapy: WFL for tasks assessed/performed Overall Cognitive Status: Within Functional Limits for tasks assessed                                        General Comments General comments (skin integrity, edema, etc.): Heel to shin test WNL bilat. Finger to nose test demonstrating mild dysmetria.     Exercises     Assessment/Plan    PT Assessment Patient needs continued PT services  PT Problem List Decreased strength;Decreased activity tolerance;Cardiopulmonary status limiting activity;Decreased balance;Decreased mobility;Decreased knowledge of use of DME       PT Treatment Interventions DME instruction;Therapeutic activities;Gait training;Therapeutic exercise;Patient/family education;Functional mobility training;Balance training    PT Goals (Current goals can be found in the Care Plan section)  Acute Rehab PT Goals Patient Stated Goal: improve mobility  PT Goal Formulation: With patient/family Time For Goal Achievement: 11/07/17 Potential to Achieve Goals: Good    Frequency Min  3X/week   Barriers to discharge        Co-evaluation               AM-PAC PT "6 Clicks" Daily Activity  Outcome Measure Difficulty turning over in bed (including adjusting bedclothes, sheets and blankets)?: Unable Difficulty moving from lying on back to sitting on the side of the bed? : Unable Difficulty sitting down on and standing up from a chair with arms (e.g., wheelchair, bedside commode, etc,.)?: Unable Help needed moving to and from a bed to chair (including a wheelchair)?: A Little Help needed walking in hospital room?: A Little Help needed climbing 3-5 steps with a railing? : A Lot 6 Click Score: 11    End of Session Equipment Utilized During Treatment: Gait belt Activity Tolerance: Patient limited by fatigue;Other (comment)(O2sats limiting pt, some anxiety with mobility ) Patient left: in chair;with call bell/phone within reach;with family/visitor present Nurse Communication: Mobility status PT Visit Diagnosis: Unsteadiness on feet (R26.81);Difficulty in walking, not elsewhere classified (R26.2)    Time: 1430-1500 PT Time Calculation (min) (ACUTE ONLY): 30 min   Charges:   PT Evaluation $PT Eval Moderate Complexity: 1 Mod PT Treatments $Therapeutic Activity: 8-22 mins       [name], PT Acute Rehabilitation Services Pager [phone]  Office [phone]   Sully Dyment D Elonda Husky 10/24/2017, 5:34  PM   

## 2017-10-24 NOTE — Progress Notes (Signed)
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 Admitted on 9/22 with neutropenic fever, started on broad-spectrum antibiotics. Critical care consulted 9/25 for acute on chronic hypoxic respiratory failure. Pt with nonhodgkins lymphoma dx in 2018. PMh includes essential tremor, diffuse large B cell lymphoma diagnosed 2005.     PT Comments    Pt more confident with mobility upon PT return to get pt back to bed. Pt transferred with HFNC this session, with no drop in O2sat below 91%. Pt requiring mod assist to return to bed in lying position. PT to continue to follow, to progress mobility as pt tolerates.    Follow Up Recommendations  Supervision for mobility/OOB;Home health PT     Equipment Recommendations  Rolling walker with 5" wheels    Recommendations for Other Services       Precautions / Restrictions Precautions Precautions: Fall Precaution Comments: HFNC, BiPAP, monitor O2sats during session. Neutropenic Restrictions Weight Bearing Restrictions: No    Mobility  Bed Mobility Overal bed mobility: Needs Assistance Bed Mobility: Sit to Supine     Supine to sit: Min guard;HOB elevated Sit to supine: Mod assist;+2 for safety/equipment;+2 for physical assistance   General bed mobility comments: Pt up in chair upon arrival to room. Mod assist for sit to supine for LE management, lines/leads, trunk lowering   Transfers Overall transfer level: Needs assistance Equipment used: None Transfers: Sit to/from [facility] Sit to Stand: Min assist Stand pivot transfers: Min assist       General transfer comment: Min assist for power up and steadying. PT with use of bilat UEs to support pt. Pt with scooting on EOB with min  guard.  Ambulation/Gait                 Stairs             Wheelchair Mobility    Modified Rankin (Stroke Patients Only)       Balance Overall balance assessment: Needs assistance Sitting-balance support: Feet supported Sitting balance-Leahy Scale: Fair Sitting balance - Comments: sits on EOB with no UE support, unable to accept challenge      Standing balance-Leahy Scale: Poor Standing balance comment: relies on RW for UE support                             Cognition Arousal/Alertness: Awake/alert Behavior During Therapy: [facility] for tasks assessed/performed;Anxious Overall Cognitive Status: Within Functional Limits for tasks assessed                                        Exercises      General Comments General comments (skin integrity, edema, etc.): Heel to shin test WNL bilat. Finger to nose test demonstrating mild dysmetria.       Pertinent Vitals/Pain Pain Assessment: No/denies pain    Home Living Family/patient expects to be discharged to:: Private residence Living Arrangements: Spouse/significant other Available Help at Discharge: Family Type of Home: House Home Access: Level entry   Home Layout: Two level Home Equipment: None      Prior Function Level of Independence: Independent          PT Goals (current  goals can now be found in the care plan section) Acute Rehab PT Goals Patient Stated Goal: improve mobility  PT Goal Formulation: With patient/family Time For Goal Achievement: 11/07/17 Potential to Achieve Goals: Good Progress towards PT goals: Progressing toward goals    Frequency    Min 3X/week      PT Plan Current plan remains appropriate    Co-evaluation              AM-PAC PT "6 Clicks" Daily Activity  Outcome Measure  Difficulty turning over in bed (including adjusting bedclothes, sheets and blankets)?: Unable Difficulty moving from lying on back to sitting on the side of the  bed? : Unable Difficulty sitting down on and standing up from a chair with arms (e.g., wheelchair, bedside commode, etc,.)?: Unable Help needed moving to and from a bed to chair (including a wheelchair)?: A Little Help needed walking in hospital room?: A Little Help needed climbing 3-5 steps with a railing? : A Lot 6 Click Score: 11    End of Session Equipment Utilized During Treatment: Gait belt Activity Tolerance: Patient limited by fatigue;Other (comment)(O2sats limiting pt, some anxiety with mobility ) Patient left: with call bell/phone within reach;with family/visitor present;in bed;with nursing/sitter in room Nurse Communication: Mobility status PT Visit Diagnosis: Unsteadiness on feet (R26.81);Difficulty in walking, not elsewhere classified (R26.2)     Time: 6808-8110 PT Time Calculation (min) (ACUTE ONLY): 22 min  Charges:  $Therapeutic Activity: 8-22 mins                     Jeyren Danowski Conception [name], PT Acute Rehabilitation Services Pager [phone]  Office [phone]   Damiano Stamper D Kaylie Ritter 10/24/2017, 5:51 PM

## 2017-10-24 NOTE — Progress Notes (Signed)
Mr. Navarrete tolerated 2nd dose of Hi Dose Ara C well.  No complaints.

## 2017-10-24 NOTE — Progress Notes (Signed)
IP PROGRESS NOTE  Subjective:   Mr. Gersten feels better.  He was able to stay off of BiPAP part of the day yesterday.  No nausea.  Objective: Vital signs in last 24 hours: Blood pressure (!) 138/97, pulse 64, temperature (!) 96.8 F (36 C), resp. rate (!) 21, height 5' 8" (1.727 m), weight 172 lb 9.6 oz (78.3 kg), SpO2 94 %.  Intake/Output from previous day: 10/02 0701 - 10/03 0700 In: 325.8 [P.O.:270; IV Piggyback:55.8] Out: 2200 [Urine:2200]  Physical Exam:  HEENT: No thrush Cardiac: Regular rate and rhythm Lungs: Good air movement bilaterally, decreased breath sounds with rales at the left lower posterior chest Abdomen: No hepatosplenomegaly, nontender Extremities: No leg edema   Portacath/PICC-without erythema  Lab Results: Recent Labs    10/22/17 0415  WBC 10.3  HGB 7.8*  HCT 23.3*  PLT 74*    BMET Recent Labs    10/22/17 0415  NA 141  K 4.5  CL 106  CO2 27  GLUCOSE 168*  BUN 43*  CREATININE 1.01  CALCIUM 8.4*    Studies/Results: Dg Chest Port 1 View  Result Date: 10/22/2017 CLINICAL DATA:  Acute respiratory failure EXAM: PORTABLE CHEST 1 VIEW COMPARISON:  Chest x-ray of October 20, 2017 FINDINGS: The lungs are adequately inflated. Confluent airspace opacities have decreased in conspicuity but not completely cleared. The left hemidiaphragm remains obscured. There is a small left pleural effusion. The heart is not enlarged. The pulmonary vascularity is indistinct. The power port catheter tip projects over the junction of the middle and distal thirds of the SVC. There is multilevel right lateral endplate spurring of the thoracic spine. IMPRESSION: Mild interval decrease in the conspicuity of bilateral airspace opacities consistent with improving bilateral pneumonia. Electronically Signed   By: David  Martinique M.D.   On: 10/22/2017 09:29    Medications: I have reviewed the patient's current medications.  Assessment/Plan:  1. T-cell lymphoma, CD30 positive,  ALK negative presenting with diffuse palpable lymphadenopathy, sweats February 2018  Status post biopsy right cervical adenopathy 03/14/2016 with pathology confirming involvement by T-cell lymphoma with the differential including a peripheral T-cell lymphoma, NOS with expression of CD30versus an ALK negative anaplastic large cell lymphoma; CD3 and CD43 positive, Ki-67 with an elevated proliferation rate.  PET scan 03/20/2016 with extensive bulky hypermetabolic nodal activity in the neck, chest, abdomen and pelvis; mild splenomegaly with diffusely mildly accentuated splenic activity  Staging bone marrow biopsy 03/23/2016-negative for involvement with lymphoma  Cycle 1 EPOCH beginning 03/23/2016  Cycle 2 [facility] 04/13/2016  Restaging PET scan at [facility] 05/08/2016-lymph nodes with various degrees of FDG activity in the neck, axilla, right written him, mesentery, pelvis, and groin. Indeterminate liver lesions without FDG activity, nodular mass in the posterior medial aspect of the left thigh  Cycle 3 EPOCH04/27/2018  Cycle 1 CVP 06/21/2016  Cytoxan/prednisone plus brentuximab 07/17/2016  Cytoxan/prednisone plus brentuximab 08/07/2016 (dose reduced)  Initiation of every three-week brentuximab08/08/2016  brentuximabdose reduced 10/31/2016 secondary to neuropathy  Brentuximab further dose reduced 01/03/2017 secondary to neuropathy  Presentation with parietal scalp nodular lesion 03/08/2017  Staging PET scan 2/42/6834-[id number] hypermetabolic lymphadenopathy in the neck, chest, abdomen/pelvis, and hypermetabolic cutaneous lesions  03/22/2017 CT biopsy left retroperitoneal nodal mass-recurrent CD30 positive T-cell lymphoma  Cycle 1 bendamustine/CPI-613on clinical trial at [facility] 04/08/2017  CTs 05/22/2017 After 2 cycles-mixed response: Enlargement of left supraclavicular node, right axillary node, right pelvic sidewall mass, decreased retroperitoneal abdominal adenopathy  Cycle 3  bendamustine/CPI-613 06/03/2017  Cycle 4 bendamustine/CPI-613 07/09/2017  CT 08/05/2017- increase in the size of a level to a lymph node, decreased size of level 3 and supraclavicular nodes, enlargement of right axillary nodes, new bilateral pulmonary nodules, decreased mediastinal, rectal peritoneal, and iliac nodes  Cycle 5 bendamustine/CPI-6137/16/2019  Cycle 6 bendamustine/CPI-613 09/02/2017  Cycle 1 cisplatin/gemcitabine 09/30/2017 by day 8 gemcitabine held secondary to neutropenia/thrombocytopenia  Cycle 1 DHAP 10/21/2017 2. 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 He entered clinical remission following chemotherapy and remained in remission when he was seen at the cancer center 02/24/2009. 3. 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 Staging PET scan 08/23/2010 with increased FDG activity at the left tonsillar fossa and no additional evidence of lymphoma. He completed 4 cycles of R-ICE with cycle #1 beginning on 09/19/2010 and cycle #4 on 11/21/2010. Repeat head and neck examination by Dr. Rosen following R-ICE/rituximab showed no residual lymphoma. He began radiation consolidation on 12/18/2010, radiation was completed on 01/22/2011. 4. History of neutropenia secondary to rituximab. 5. Anemia secondary to chemotherapy, status post a red blood cell transfusion 11/30/2010. The hemoglobin has normalized. 6. History of mild thrombocytopenia secondary to chemotherapy. 7. Hypertension. 8. 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 05/08/2017-nodular mass in the left thigh adjacent to the femur  MRI 06/07/2016-infiltrative mass in the left thigh adductor muscle, separate from the schwannoma  excision site  MRI 10/08/2016-marked improvement in the left adductormusclemass with a small area of enhancement remaining, no discrete mass 9. Port-A-Cath placement 03/21/2016 10. Superficial thrombus left greater saphenous vein 04/13/2016. Lovenox initiated, converted to Xarelto beginning 04/18/2016 11. 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 Admission 09/20/2017 with cough/fever/dyspnea-CT concerning for progression of lymphoma in the lungs versus pneumoniatreated with vancomycin/cefepime, started Levaquin 09/25/2017 13.  Admission 10/13/2017 with fever in the setting of severe neutropenia, no source for infection identified other than possible pneumonia 14.   Respiratory failure- most likely secondary to progression of non-Hodgkin's lymphoma in the lungs       Mr. Freid appears improved from a respiratory standpoint.  He will complete a final dose of high-dose Decadron today.  We will then begin a steroid taper.  He will begin G-CSF today.  He plans to get out of bed today.   Recommendations: 1.  Complete cycle 1 DHAP with a final dose of Decadron today 2.  Begin G-CSF today 3.  Management of respiratory failure per critical care medicine 4.  Check CBC 10/25/2017    LOS: 11 days   Krew Sherrill, MD   10/24/2017, 7:19 AM    

## 2017-10-24 NOTE — Progress Notes (Signed)
Today's HI Dose Ara -C dose verified with Lottie Dawson, RN.

## 2017-10-24 NOTE — Progress Notes (Signed)
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  Admitted on 9/22 with neutropenic fever, started on broad-spectrum antibiotics. Critical care consulted 9/25 for acute on chronic hypoxic respiratory failure. Overall, has improved with initiation of chemo. On and off Bipap, O2 requirements have improved.   Significant Hospital Events    9/23: Infectious disease consulted.  Had developed mild worsening and chronic cough and shortness of breath recommended to continue vancomycin and cefepime possible pneumonia.  Was given Granix per hematology with plans to continue this until [place] was over 1000.  Chest x-ray with diffuse bilateral pulmonary infiltrates chronic in nature since late Aug 2019, 9/24: Having intermittent fever, cough a little bit worse.  One episode of nausea vomiting after forceful cough.  Room air oxygenation at 77% 9/25: Increased tachypnea.  Fluid challenge administered earlier.  Given IV Lasix.  Chest x-ray obtained showed progressive left lower lobe airspace disease, and persistent bilateral patchy airspace disease, oxygen requirements up to 7 L, CT chest negative for pulmonary emboli.  Moved to stepdown unit, trickle care asked to see for acute on chronic respiratory failure.  9/26: feels better. Still on high FIO2 cough and fever improved. Over all looking better. Neg almost 2 liters over 24 hrs but still > 4 liters + 9/27 still febrile still w/ high FIO2 needs systemic steroids started 9/27 through 9/30: Antibiotics continued.  Still requiring high flow oxygen and BiPAP alternating.  Aspergillosis antibody sent.  Steroid dosing increased on the 28th.  On the 30th still  very hypoxic rapidly desaturating.  Case was discussed with oncology at [facility] as well as [facility], the consensus was to proceed with DHAP chemotherapy, this was toc be initiated at reduced therapy due to recent hematologic toxicity 10/1, 10/2, 10/3 - Improving slowly, off/no BIPAP PRN 10/3 - Down to 45% FiO2, chemo + diuresis   Consults: date of consult/date signed off & final recs:  Pulmonary consulted 9/25  Procedures (surgical and bedside):    Significant Diagnostic Tests:  Echocardiogram from 8/31: Ejection fraction 55 to 60% wall Motion was normal diastolic function normal wall CT angiogram 9/25: Negative for pulmonary edema.  Increased mediastinal adenopathy, hilar adenopathy, and central groundglass opacities.  Also has left pleural effusion.  This looks small to moderate in size.  Pulmonary masses and right axillary adenopathy are stable.  Increased consolidation left lower lobe.  Micro Data: Respiratory virus panel 9/24: Negative Blood cultures obtained 9/23:>>> Urine culture 9/22: Negative  Antimicrobials:  Aztreonam 9/22 through 9/23 Cefepime 9/23>>>9/30 Metronidazole 9/22 through 9/23 Vancomycin 9/22>>>9/30 Diflucan X 5 days, oral thrush   Subjective:  Doing well this morning. Patient states that he feels like he can draw a larger breath. Overall, much improved. Famil every happy with progress. I met with Dr. [name] at the bedside to discuss progress. Made plans regarding lab draws as well as pred taper.   Objective   Blood pressure (!) 138/97, pulse 67, temperature (!) 96.8 F (36 C), resp. rate 20, height '5\' 8"'$  (1.727 m), weight 78.3 kg, SpO2 91 %.    FiO2 (%):  [70 %-100 %] 70 %   Intake/Output Summary (Last 24 hours) at 10/24/2017 0759 Last  data filed at 10/24/2017 0600 Gross per 24 hour  Intake 325.8 ml  Output 2200 ml  Net -1874.2 ml   Filed Weights   10/13/17 1907 10/13/17 2121  Weight: 79.4 kg 78.3 kg    Examination: General appearance: 57  y.o., male, NAD, conversant, less tachapenic  Eyes: anicteric sclerae, moist conjunctivae; pupils reactive  HENT: NCAT; oropharynx, few scattered white patches, no ulcerations  Neck: Trachea midline; no JVD  Lungs: BL breaths sounds improved, better effort, moving air into bases easier, faint basilar crackles, no rhonchi or wheezing  CV: RRR, S1, S2, no MRGs  Abdomen: Soft, non-tender; non-distended, BS present  Extremities: trace BL LE edema Skin: Normal temperature, turgor and texture; few scattered petechia  Psych: Appropriate affect, less depressed today, definitely more optimistic  Neuro: Alert and oriented to person and place, no focal deficit   Resolved Hospital Problem list     Assessment & Plan:   Acute on chronic hypoxic respiratory failure in the setting of diffuse pulmonary nodular infiltrates due to lymphoma involvement of the lung - unclear etiology of these nodular infiltrates, we are assuming this is related to his lymphoma - I believe that bronchoscopy in him at this point would led to mechanical ventilation and the family is not ready for this nor is the patient.  - this could represent an opportunistic infectious process due to his ongoing disease processes  - O2 requirements improved, resp failure improving  Plan Holding Abx May need antimicrobial ppx pending WBC/ANC Off bipap as much as tolerated during the day  Titrated FiO2 on Bipap down to 45% this morning. Remains ICU level care for close HD and respiratory care  On [place] when off BIPAP. Titrate FiO2 to maintain Spo2 >88%   Pancytopenia. W/ febrile neutropenia  Plan Repeat blood counts in the AM per oncology  They are starting his GMCSF today   T-cell lymphoma with metastasis to lung Plan Tolerating this chemo regimen Chemo orders per oncology Decadron stopping from chemo regimen    History of superficial DVT Had been on Xarelto since 2018 Plan Continue LMWH, dvt ppx   Normal Serum Cr Positive  CFB  Plan Follow urine output  Hold additional diuresis today  Recheck CMP/Magnesium this afternoon and in the AM   Critical Illness Nutritional Support Debility  - started thiamine and multivitamin  - will check electrolytes and mag and phos today   Disposition / Summary of Today's Plan 10/24/17   Checking kidney function, electrolytes, mag, phos today  Started multivitamin, thamine  Start prednisone taper     Diet: regular  Pain/Anxiety/Delirium protocol (if indicated): Not indicated VAP protocol (if indicated): Not indicated DVT prophylaxis: [place] LMWH GI prophylaxis: Not indicated Hyperglycemia protocol: Not indicated Mobility: Bedrest Code Status: Full code Family Communication: Family updated  Labs   CBC: Recent Labs  Lab 10/18/17 0634 10/20/17 0509 10/21/17 0359 10/22/17 0415  WBC 4.7 14.7* 13.7* 10.3  NEUTROABS 4.0 14.3* 12.4* 9.5  HGB 7.9* 8.2* 8.2* 7.8*  HCT 22.6* 23.5* 23.7* 23.3*  MCV 91.9 91.8 93.3 94.7  PLT 69* 82* 78* 74*    Basic Metabolic Panel: Recent Labs  Lab 10/18/17 0634 10/19/17 0500 10/20/17 0509 10/21/17 0359 10/22/17 0415  NA 141 139 141 143 141  K 3.7 3.4* 3.8 4.1 4.5  CL 104 102 104 105 106  CO2 '26 26 26 28 27  '$ GLUCOSE 167* 136* 208* 182* 168*  BUN 34* 37* 48* 47* 43*  CREATININE 1.13 1.03 1.10 1.03  1.01  CALCIUM 8.4* 8.6* 8.9 9.0 8.4*  MG 1.8 1.8  --   --   --    GFR: Estimated Creatinine Clearance: 78.1 mL/min (by C-G formula based on SCr of 1.01 mg/dL). Recent Labs  Lab 10/17/17 1436 10/18/17 0634 10/19/17 0500 10/20/17 0509 10/21/17 0359 10/22/17 0415  PROCALCITON 0.36 0.47 0.70  --   --   --   WBC  --  4.7  --  14.7* 13.7* 10.3    Liver Function Tests: Recent Labs  Lab 10/20/17 0509 10/21/17 0359 10/22/17 0415  AST 25 58* 26  ALT 32 69* 51*  ALKPHOS 48 48 44  BILITOT 0.7 0.7 0.6  PROT 5.3* 5.1* 4.8*  ALBUMIN 2.2* 2.2* 2.0*   No results for input(s): LIPASE, AMYLASE in the last 168 hours. No results  for input(s): AMMONIA in the last 168 hours.  ABG    Component Value Date/Time   PHART 7.418 10/16/2017 0802   PCO2ART 34.8 10/16/2017 0802   PO2ART 36.3 (LL) 10/16/2017 0802   HCO3 22.0 10/16/2017 0802   ACIDBASEDEF 1.5 10/16/2017 0802   O2SAT 62.9 10/16/2017 0802     Coagulation Profile: No results for input(s): INR, PROTIME in the last 168 hours.  Cardiac Enzymes: No results for input(s): CKTOTAL, CKMB, CKMBINDEX, TROPONINI in the last 168 hours.  HbA1C: No results found for: HGBA1C  CBG: Recent Labs  Lab 10/23/17 1935 10/23/17 2058 10/23/17 2326 10/24/17 0357 10/24/17 0723  GLUCAP 230* 217* 253* 177* 171*    Don Nash, DO [facility] 10/24/2017 7:59 AM  Personal pager: [phone] If unanswered, please page CCM On-call: [phone]

## 2017-10-24 NOTE — Progress Notes (Signed)
eLink Physician-Brief Progress Note Patient Name: [name] DOB: [birth year] MRN: [id number]   Date of Service  10/24/2017  HPI/Events of Note  Patient c/o heartburn.   eICU Interventions  Will order: 1. Mylanta 30 mL PO Q 4 hours PRN heartburn or intigestion.       Intervention Category Major Interventions: Other:  Attikus Bartoszek Cornelia Copa 10/24/2017, 10:20 PM

## 2017-10-25 ENCOUNTER — Other Ambulatory Visit: Payer: Self-pay

## 2017-10-25 DIAGNOSIS — N179 Acute kidney failure, unspecified: Secondary | ICD-10-CM

## 2017-10-25 LAB — BASIC METABOLIC PANEL
Anion gap: 12 (ref 5–15)
Anion gap: 14 (ref 5–15)
BUN: 84 mg/dL — AB (ref 6–20)
BUN: 83 mg/dL — ABNORMAL HIGH (ref 6–20)
CALCIUM: 8.5 mg/dL — AB (ref 8.9–10.3)
CALCIUM: 8.7 mg/dL — AB (ref 8.9–10.3)
CHLORIDE: 104 mmol/L (ref 98–111)
CO2: 25 mmol/L (ref 22–32)
CO2: 24 mmol/L (ref 22–32)
CREATININE: 1.57 mg/dL — AB (ref 0.61–1.24)
Chloride: 101 mmol/L (ref 98–111)
Creatinine, Ser: 1.6 mg/dL — ABNORMAL HIGH (ref 0.61–1.24)
GFR calc Af Amer: 55 mL/min — ABNORMAL LOW (ref >60)
GFR calc non Af Amer: 47 mL/min — ABNORMAL LOW (ref >60)
GFR, EST AFRICAN AMERICAN: 54 mL/min — AB (ref >60)
GFR, EST NON AFRICAN AMERICAN: 46 mL/min — AB (ref >60)
GLUCOSE: 252 mg/dL — AB (ref 70–99)
GLUCOSE: 307 mg/dL — AB (ref 70–99)
POTASSIUM: 4.7 mmol/L (ref 3.5–5.1)
Potassium: 4.5 mmol/L (ref 3.5–5.1)
Sodium: 141 mmol/L (ref 135–145)
Sodium: 139 mmol/L (ref 135–145)

## 2017-10-25 LAB — CBC WITH DIFFERENTIAL/PLATELET
BASOS PCT: 0 %
Band Neutrophils: 0 %
Basophils Absolute: 0 10*3/uL (ref 0.0–0.1)
Blasts: 0 %
EOS PCT: 0 %
Eosinophils Absolute: 0 10*3/uL (ref 0.0–0.7)
HCT: 20.6 % — ABNORMAL LOW (ref 39.0–52.0)
Hemoglobin: 7.2 g/dL — ABNORMAL LOW (ref 13.0–17.0)
LYMPHS ABS: 1.2 10*3/uL (ref 0.7–4.0)
LYMPHS PCT: 4 %
MCH: 32.3 pg (ref 26.0–34.0)
MCHC: 35 g/dL (ref 30.0–36.0)
MCV: 92.4 fL (ref 78.0–100.0)
MONO ABS: 0.3 10*3/uL (ref 0.1–1.0)
MONOS PCT: 1 %
Metamyelocytes Relative: 0 %
Myelocytes: 0 %
NEUTROS PCT: 95 %
NRBC: 0 /100{WBCs}
Neutro Abs: 29.2 10*3/uL — ABNORMAL HIGH (ref 1.7–7.7)
OTHER: 0 %
PLATELETS: 57 10*3/uL — AB (ref 150–400)
Promyelocytes Relative: 0 %
RBC: 2.23 MIL/uL — AB (ref 4.22–5.81)
RDW: 15.3 % (ref 11.5–15.5)
WBC: 30.7 10*3/uL — AB (ref 4.0–10.5)

## 2017-10-25 LAB — COMPREHENSIVE METABOLIC PANEL
ALK PHOS: 47 U/L (ref 38–126)
ALT: 36 U/L (ref 0–44)
ANION GAP: 11 (ref 5–15)
AST: 26 U/L (ref 15–41)
Albumin: 2.1 g/dL — ABNORMAL LOW (ref 3.5–5.0)
BUN: 84 mg/dL — ABNORMAL HIGH (ref 6–20)
CALCIUM: 8.6 mg/dL — AB (ref 8.9–10.3)
CHLORIDE: 105 mmol/L (ref 98–111)
CO2: 26 mmol/L (ref 22–32)
CREATININE: 1.52 mg/dL — AB (ref 0.61–1.24)
GFR, EST AFRICAN AMERICAN: 57 mL/min — AB (ref >60)
GFR, EST NON AFRICAN AMERICAN: 49 mL/min — AB (ref >60)
Glucose, Bld: 200 mg/dL — ABNORMAL HIGH (ref 70–99)
Potassium: 4.8 mmol/L (ref 3.5–5.1)
SODIUM: 142 mmol/L (ref 135–145)
Total Bilirubin: 0.6 mg/dL (ref 0.3–1.2)
Total Protein: 4.8 g/dL — ABNORMAL LOW (ref 6.5–8.1)

## 2017-10-25 LAB — GLUCOSE, CAPILLARY
GLUCOSE-CAPILLARY: 200 mg/dL — AB (ref 70–99)
GLUCOSE-CAPILLARY: 162 mg/dL — AB (ref 70–99)
GLUCOSE-CAPILLARY: 298 mg/dL — AB (ref 70–99)
Glucose-Capillary: 294 mg/dL — ABNORMAL HIGH (ref 70–99)
Glucose-Capillary: 217 mg/dL — ABNORMAL HIGH (ref 70–99)

## 2017-10-25 MED ORDER — ONDANSETRON HCL 4 MG PO TABS
8.0000 mg | ORAL_TABLET | Freq: Three times a day (TID) | ORAL | Status: DC | PRN
  Administered 2017-10-25 – 2017-10-27 (×6): 8 mg via ORAL
  Filled 2017-10-25 (×6): qty 2

## 2017-10-25 MED ORDER — SODIUM CHLORIDE 0.9 % IV SOLN
8.0000 mg | Freq: Four times a day (QID) | INTRAVENOUS | Status: DC | PRN
  Filled 2017-10-25: qty 4

## 2017-10-25 MED ORDER — DEXAMETHASONE 4 MG PO TABS
4.0000 mg | ORAL_TABLET | Freq: Two times a day (BID) | ORAL | Status: AC
  Administered 2017-10-27 – 2017-10-31 (×10): 4 mg via ORAL
  Filled 2017-10-25 (×2): qty 2
  Filled 2017-10-25 (×2): qty 1
  Filled 2017-10-25: qty 2
  Filled 2017-10-25 (×2): qty 1
  Filled 2017-10-25: qty 2
  Filled 2017-10-25: qty 1
  Filled 2017-10-25: qty 2

## 2017-10-25 MED ORDER — ONDANSETRON HCL 4 MG PO TABS: 4.0000 mg | ORAL_TABLET | Freq: Four times a day (QID) | ORAL | Status: DC | PRN

## 2017-10-25 MED ORDER — DEXAMETHASONE 2 MG PO TABS
8.0000 mg | ORAL_TABLET | Freq: Two times a day (BID) | ORAL | Status: AC
  Administered 2017-10-25 – 2017-10-26 (×4): 8 mg via ORAL
  Filled 2017-10-25 (×4): qty 4

## 2017-10-25 NOTE — Care Management Note (Signed)
Case Management Note  Patient Details  Name: [name] MRN: [id number] Date of Birth: [birth year]  Subjective/Objective:                  Bi-pap at 50%  Action/Plan: Following for progression of care and discharge planning No cm needs present at this time.  Expected Discharge Date:  10/15/17               Expected Discharge Plan:  Home/Self Care  In-House Referral:     Discharge planning Services  CM Consult  Post Acute Care Choice:    Choice offered to:     DME Arranged:    DME Agency:     HH Arranged:    HH Agency:     Status of Service:  In process, will continue to follow  If discussed at Long Length of Stay Meetings, dates discussed:    Additional Comments:  Leeroy Cha, RN 10/25/2017, 9:54 AM

## 2017-10-25 NOTE — Evaluation (Signed)
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 Admitted on 9/22 with neutropenic fever, started on broad-spectrum antibiotics. Critical care consulted 9/25 for acute on chronic hypoxic respiratory failure. Pt with nonhodgkins lymphoma dx in 2018. PMh includes essential tremor, diffuse large B cell lymphoma diagnosed 2005.    Clinical Impression   Pt was admitted for the above. At baseline, he is independent with all adls. Pt is on HFNC and tolerated evaluation well. He needs min to mod A for sit to stand and mod to max A for LB adls.  Issued level 2 theraband for him to work on and started energy conservation education. Will continue to follow with min guard level goals in acute setting     Follow Up Recommendations  Supervision/Assistance - 24 hour;Home health OT    Equipment Recommendations  3 in 1 bedside commode    Recommendations for Other Services       Precautions / Restrictions Precautions Precautions: Fall Precaution Comments: HFNC, watch sats.  Restrictions Weight Bearing Restrictions: No      Mobility Bed Mobility Overal bed mobility: Needs Assistance             General bed mobility comments: Pt up in chair upon arrival to room, PT left room with pt up in chair.   Transfers Overall transfer level: Needs assistance Equipment used: Rolling walker (2 wheeled) Transfers: Sit to/from Stand Sit to Stand: Mod assist;Min assist;+2 physical assistance;+2 safety/equipment         General transfer comment: Sit to stands to increase LE and hip strength, focusing on hip extension during sit to stand and slow and controlled lowering when moving from stand to sit. Verbal cuing for hand placement (pushing with UEs from chair and reaching  back for armrests when moving from stand to sit). Pt with mod assist initially for power up and steadying, transitioned to min assist when pt scooted to edge of chair prior to standing.     Balance Overall balance assessment: Needs assistance Sitting-balance support: Feet supported Sitting balance-Leahy Scale: Fair Sitting balance - Comments: sits on EOB with no UE support, unable to accept challenge      Standing balance-Leahy Scale: Poor Standing balance comment: relies on RW for UE support                            ADL either performed or assessed with clinical judgement   ADL Overall ADL's : Needs assistance/impaired             Lower Body Bathing: Moderate assistance;Sit to/from stand       Lower Body Dressing: Maximal assistance;Sit to/from stand                 General ADL Comments: pt is able to perform UB adls with set up. Worked on sit to stand several times but did not transfer as he had just gotten up to chair     Vision         Perception     Praxis      Pertinent Vitals/Pain Pain Assessment: Faces Faces Pain Scale: Hurts a little bit Pain Location: bilateral quads  Pain Descriptors / Indicators: Burning Pain Intervention(s): Limited activity within patient's tolerance;Repositioned;Monitored during session     Hand  Dominance     Extremity/Trunk Assessment Upper Extremity Assessment Upper Extremity Assessment: Generalized weakness(grossly 4- to 4/5)           Communication Communication Communication: No difficulties   Cognition Arousal/Alertness: Awake/alert Behavior During Therapy: WFL for tasks assessed/performed;Anxious Overall Cognitive Status: Within Functional Limits for tasks assessed                                     General Comments       Exercises  Other Exercises Other Exercises: issued level 2 theraband for horizontal abduction, FF and elbow flexion.  Pt performed after instruction.   Educated to have NT/RN check lines prior to doing and to work on 3-5 reps then rest,. Also issued squeeze ball for bil hand swelling and educated to keep hands elevated as much as possible   Shoulder Instructions      Home Living Family/patient expects to be discharged to:: Private residence Living Arrangements: Spouse/significant other                 Bathroom Shower/Tub: Occupational psychologist: [place]: None          Prior Functioning/Environment Level of Independence: Independent                 OT Problem List: Decreased strength;Decreased activity tolerance;Impaired balance (sitting and/or standing);Decreased knowledge of use of DME or AE;Cardiopulmonary status limiting activity      OT Treatment/Interventions: Self-care/ADL training;Therapeutic exercise;DME and/or AE instruction;Patient/family education;Balance training;Therapeutic activities    OT Goals(Current goals can be found in the care plan section) Acute Rehab OT Goals Patient Stated Goal: regain strength OT Goal Formulation: With patient Time For Goal Achievement: 11/08/17 Potential to Achieve Goals: Good ADL Goals Pt Will Transfer to Toilet: with min guard assist;ambulating;bedside commode Pt Will Perform Toileting - Clothing Manipulation and hygiene: with min guard assist;sit to/from stand Pt Will Perform Tub/Shower Transfer: Shower transfer;with min guard assist;ambulating;3 in 1 Pt/caregiver will Perform Home Exercise Program: Increased strength;Both right and left upper extremity;With theraband;With written HEP provided Additional ADL Goal #1: pt will verbalize/demonstrate use of AE for adls and verbalize 3 energy conservation strategies  OT Frequency: Min 2X/week   Barriers to D/C:            Co-evaluation PT/OT/SLP Co-Evaluation/Treatment: Yes Reason for Co-Treatment: For patient/therapist safety PT goals addressed during session: Mobility/safety with  mobility OT goals addressed during session: Strengthening/ROM      AM-PAC PT "6 Clicks" Daily Activity     Outcome Measure Help from another person eating meals?: A Little Help from another person taking care of personal grooming?: A Little Help from another person toileting, which includes using toliet, bedpan, or urinal?: A Lot Help from another person bathing (including washing, rinsing, drying)?: A Lot Help from another person to put on and taking off regular upper body clothing?: A Little Help from another person to put on and taking off regular lower body clothing?: A Lot 6 Click Score: 15   End of Session Nurse Communication: (activity tolerance)  Activity Tolerance: Patient tolerated treatment well Patient left: in chair;with call bell/phone within reach;with family/visitor present  OT Visit Diagnosis: Muscle weakness (generalized) (M62.81)                Time: 9811-9147 OT Time Calculation (min): 32 min Charges:  OT General Charges $OT Visit: 1  Visit OT Evaluation $OT Eval Moderate Complexity: [place], OTR/L Acute Rehabilitation Services [phone] [facility] pager [phone] office 10/25/2017  Letita Prentiss 10/25/2017, 12:53 PM

## 2017-10-25 NOTE — Progress Notes (Signed)
IP PROGRESS NOTE  Subjective:   [name] continues to feel better from a respiratory standpoint.  Nausea yesterday was relieved with Zofran.  Objective: Vital signs in last 24 hours: Blood pressure 136/82, pulse 61, temperature (!) 97 F (36.1 C), resp. rate 18, height _0  (1.727 m), weight 172 lb 9.6 oz (78.3 kg), SpO2 94 %.  Intake/Output from previous day: 10/03 0701 - 10/04 0700 In: 170 [P.O.:170] Out: 2200 [Urine:2200]  Physical Exam:   Cardiac: Regular rate and rhythm Lungs: Good air movement bilaterally, rales at the lower posterior chest bilaterally, no respiratory distress Abdomen: No hepatosplenomegaly, nontender Extremities: No leg edema   Portacath/PICC-without erythema  Lab Results: Recent Labs    10/25/17 0405  WBC 30.7*  HGB 7.2*  HCT 20.6*  PLT 57*    BMET Recent Labs    10/24/17 1034 10/25/17 0405  NA 140 142  K 4.6 4.8  CL 101 105  CO2 25 26  GLUCOSE 213* 200*  BUN 78* 84*  CREATININE 1.49* 1.52*  CALCIUM 8.3* 8.6*    Studies/Results: No results found.  Medications: I have reviewed the patient's current medications.  Assessment/Plan:  1. 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  PET scan 03/20/2016 with extensive bulky hypermetabolic nodal activity in the neck, chest, abdomen and pelvis; mild splenomegaly with diffusely mildly accentuated splenic activity  Staging bone marrow biopsy 03/23/2016-negative for involvement with lymphoma  Cycle 1 EPOCH beginning 03/23/2016  Cycle 2 [facility] 04/13/2016  Restaging PET scan at [facility]  05/08/2016-lymph nodes with various degrees of FDG activity in the neck, axilla, right written him, mesentery, pelvis, and groin. Indeterminate liver lesions without FDG activity, nodular mass in the posterior medial aspect of the left thigh  Cycle 3 EPOCH04/27/2018  Cycle 1 CVP 06/21/2016  Cytoxan/prednisone plus brentuximab 07/17/2016  Cytoxan/prednisone plus brentuximab 08/07/2016 (dose reduced)  Initiation of every three-week brentuximab08/08/2016  brentuximabdose reduced 10/31/2016 secondary to neuropathy  Brentuximab further dose reduced 01/03/2017 secondary to neuropathy  Presentation with parietal scalp nodular lesion 03/08/2017  Staging PET scan 1/66/0630-ZSWFUXN hypermetabolic lymphadenopathy in the neck, chest, abdomen/pelvis, and hypermetabolic cutaneous lesions  03/22/2017 CT biopsy left retroperitoneal nodal mass-recurrent CD30 positive T-cell lymphoma  Cycle 1 bendamustine/CPI-613on clinical trial at [facility] 04/08/2017  CTs 05/22/2017 After 2 cycles-mixed response: Enlargement of left supraclavicular node, right axillary node, right pelvic sidewall mass, decreased retroperitoneal abdominal adenopathy  Cycle 3 bendamustine/CPI-613 06/03/2017  Cycle 4 bendamustine/CPI-613 07/09/2017  CT 08/05/2017- increase in the size of a level to a lymph node, decreased size of level 3 and supraclavicular nodes, enlargement of right axillary nodes, new bilateral pulmonary nodules, decreased mediastinal, rectal peritoneal, and iliac nodes  Cycle 5 bendamustine/CPI-6137/16/2019  Cycle 6 bendamustine/CPI-613 09/02/2017  Cycle 1 cisplatin/gemcitabine 09/30/2017 by day 8 gemcitabine held secondary to neutropenia/thrombocytopenia  Cycle 1 DHAP 10/21/2017 2. 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 He entered clinical remission following chemotherapy and remained in remission  when he was seen at the cancer center 02/24/2009. 3. 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 Staging PET scan 08/23/2010 with increased FDG activity at the left tonsillar fossa and no additional evidence of lymphoma. He completed 4 cycles of R-ICE with cycle #1 beginning on 09/19/2010 and cycle #4 on 11/21/2010. Repeat head and neck examination by Dr. Constance Holster following R-ICE/rituximab showed no residual lymphoma. He began radiation consolidation on 12/18/2010, radiation was completed on 01/22/2011. 4. History of neutropenia secondary to rituximab. 5. Anemia secondary to chemotherapy and phlebotomy 6. Thrombocytopenia secondary to chemotherapy 7. Hypertension. 8. 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 05/08/2017-nodular mass in the left thigh adjacent to the femur  MRI 06/07/2016-infiltrative mass in the left thigh adductor muscle, separate from the schwannoma excision site  MRI 10/08/2016-marked improvement in the left adductormusclemass with a small area of enhancement remaining, no discrete mass 9. Port-A-Cath placement 03/21/2016 10. Superficial thrombus left greater saphenous vein 04/13/2016. Lovenox initiated, converted to Xarelto beginning 04/18/2016 11. 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 Admission 09/20/2017 with cough/fever/dyspnea-CT concerning for progression of lymphoma in the lungs versus pneumoniatreated with vancomycin/cefepime, started Levaquin 09/25/2017 13.  Admission 10/13/2017 with fever in the setting of severe neutropenia, no source for infection identified other than possible pneumonia 14.   Respiratory failure- most likely secondary to progression of non-Hodgkin's lymphoma in the  lungs, improved       [name] continues to improve from a respiratory standpoint.  Nausea may be  cisplatin or cytarabine.  He will continue Zofran as needed.  He will begin a Decadron taper. The elevated BUN/creatinine is likely secondary to diuresis.  I discussed the case with Dr. Valeta Harms.  He will hold further diuretics.  He is now at day 5 following cycle 1 DHAP.  He has neutrophilia secondary to steroids and G-CSF.  G-CSF will be continued.  Her white count and platelets to fall over the next several days.  We will check a CBC on 10/27/2017.  He has moderate thrombocytopenia secondary to chemotherapy.  We will hold Lovenox if the platelet count falls further.  Oncology will see him daily.   Recommendations: 1.  Begin Decadron taper 2.  Continue G-CSF 3.  Management of respiratory failure per critical care medicine 4.  Check CBC 10/27/2017 5.  Physical therapy, increase out of bed as tolerated    LOS: 12 days   [name], MD   10/25/2017, 7:15 AM

## 2017-10-25 NOTE — Progress Notes (Signed)
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 Admitted on 9/22 with neutropenic fever, started on broad-spectrum antibiotics. Critical care consulted 9/25 for acute on chronic hypoxic respiratory failure. Pt with nonhodgkins lymphoma dx in 2018. PMh includes essential tremor, diffuse large B cell lymphoma diagnosed 2005.     PT Comments    At start of session, pt complaining of bilateral thigh burning and pt stated it felt like muscle use-related. Limited session within pt's tolerance. Pt with improved transfer ability, especially with verbal cuing to use UEs to power up and to scoot forward in chair. Pt tolerated exercises well, and were beneficial for LE weakness and LE tightness in bilat gastrocnemii. Pt encouraged by both PT and OT to take rest breaks for energy conservation during session. Pt asking to practice sit to stands later, and PT/OT approved as long as wife/daughter are assisting, RW is utilized, and Therapist, sports is present. PT to continue to progress mobility as able, and will continue to follow acutely.   O2sats during session, HFNC at 45%: 86-94% with activity    Follow Up Recommendations  Supervision for mobility/OOB;Home health PT     Equipment Recommendations  Rolling walker with 5" wheels    Recommendations for Other Services       Precautions / Restrictions Precautions Precautions: Fall Precaution Comments: HFNC, watch sats.  Restrictions Weight Bearing Restrictions: No    Mobility  Bed Mobility Overal bed mobility: Needs Assistance             General bed mobility comments: Pt up in chair upon arrival to room, PT left room with pt up in chair.   Transfers Overall transfer level: Needs assistance Equipment used: Rolling walker (2  wheeled) Transfers: Sit to/from Stand Sit to Stand: Mod assist;Min assist;+2 physical assistance;+2 safety/equipment         General transfer comment: Sit to stands to increase LE and hip strength, focusing on hip extension during sit to stand and slow and controlled lowering when moving from stand to sit. Verbal cuing for hand placement (pushing with UEs from chair and reaching back for armrests when moving from stand to sit). Pt with mod assist initially for power up and steadying, transitioned to min assist when pt scooted to edge of chair prior to standing.   Ambulation/Gait                 Stairs             Wheelchair Mobility    Modified Rankin (Stroke Patients Only)       Balance Overall balance assessment: Needs assistance Sitting-balance support: Feet supported Sitting balance-Leahy Scale: Fair Sitting balance - Comments: sits on EOB with no UE support, unable to accept challenge      Standing balance-Leahy Scale: Poor Standing balance comment: relies on RW for UE support                             Cognition Arousal/Alertness: Awake/alert Behavior During Therapy: [facility] for tasks assessed/performed;Anxious Overall Cognitive Status: Within Functional Limits for tasks assessed  Exercises General Exercises - Lower Extremity Ankle Circles/Pumps: AROM;Both;10 reps;Seated Quad Sets: AROM;Both;10 reps;Seated Heel Slides: AAROM;Right;5 reps;Seated Other Exercises Other Exercises: Calf stretch with gait belt, 1x30 seconds bilaterally     General Comments        Pertinent Vitals/Pain Pain Assessment: Faces Faces Pain Scale: Hurts a little bit Pain Location: bilateral quads  Pain Descriptors / Indicators: Burning Pain Intervention(s): Limited activity within patient's tolerance;Repositioned;Monitored during session    Home Living Family/patient expects to be discharged to:: Private  residence Living Arrangements: Spouse/significant other           Home Equipment: None      Prior Function Level of Independence: Independent          PT Goals (current goals can now be found in the care plan section) Acute Rehab PT Goals Patient Stated Goal: improve mobility  PT Goal Formulation: With patient/family Time For Goal Achievement: 11/07/17 Potential to Achieve Goals: Good Progress towards PT goals: Progressing toward goals    Frequency    Min 3X/week      PT Plan Current plan remains appropriate    Co-evaluation PT/OT/SLP Co-Evaluation/Treatment: Yes Reason for Co-Treatment: For patient/therapist safety PT goals addressed during session: Mobility/safety with mobility OT goals addressed during session: Strengthening/ROM      AM-PAC PT "6 Clicks" Daily Activity  Outcome Measure  Difficulty turning over in bed (including adjusting bedclothes, sheets and blankets)?: Unable Difficulty moving from lying on back to sitting on the side of the bed? : Unable Difficulty sitting down on and standing up from a chair with arms (e.g., wheelchair, bedside commode, etc,.)?: Unable Help needed moving to and from a bed to chair (including a wheelchair)?: A Little Help needed walking in hospital room?: A Little Help needed climbing 3-5 steps with a railing? : A Lot 6 Click Score: 11    End of Session Equipment Utilized During Treatment: Gait belt Activity Tolerance: Patient limited by fatigue;Other (comment)(Rest breaks for O2sats  ) Patient left: with call bell/phone within reach;with family/visitor present;in chair Nurse Communication: Mobility status PT Visit Diagnosis: Unsteadiness on feet (R26.81);Difficulty in walking, not elsewhere classified (R26.2)     Time: 1059-1130 PT Time Calculation (min) (ACUTE ONLY): 31 min  Charges:  $Therapeutic Exercise: 8-22 mins                     [name] Conception [name], PT Acute Rehabilitation Services Pager [phone]  Office  [phone]   Jo-Anne Kluth D Elonda Husky 10/25/2017, 12:49 PM

## 2017-10-25 NOTE — Progress Notes (Signed)
Patient remains off BiPAP at this time. RT will continue to monitor patient.

## 2017-10-25 NOTE — Progress Notes (Signed)
NUTRITION NOTE  Patient seen for full assessment on 10/1. He is currently on FLD. Received a call from [facility] RD who reported Dr. [name] requested patient be provided with some oral nutrition supplements other than those available in the hospital.   This RD provided patient with supplements, reviewed each. He reports he is on BiPAP at night and in the morning drinks water, because it is refreshing, and then has some cranberry juice. He later tries to drink some Ensure. He states that a friend brought him beef bone broth for lunch and that that went well. He reports no BM in ~5 days. He states plan is to advance to a Soft diet soon. Talked with patient about consuming adequate fluids and talked about good options once diet is advanced.  Patient plans to talk with his wife and daughter about the supplements provided by RD and to have them place these items in a cooler that they have.     Jarome Matin, MS, RD, LDN, [facility] Inpatient Clinical Dietitian Pager # [phone] After hours/weekend pager # [phone]

## 2017-10-25 NOTE — Progress Notes (Signed)
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  Admitted on 9/22 with neutropenic fever, started on broad-spectrum antibiotics. Critical care consulted 9/25 for acute on chronic hypoxic respiratory failure. Overall, has improved with initiation of chemo. On and off Bipap, O2 requirements have improved.   Significant Hospital Events    9/23: Infectious disease consulted.  Had developed mild worsening and chronic cough and shortness of breath recommended to continue vancomycin and cefepime possible pneumonia.  Was given Granix per hematology with plans to continue this until Cass was over 1000.  Chest x-ray with diffuse bilateral pulmonary infiltrates chronic in nature since late Aug 2019, 9/24: Having intermittent fever, cough a little bit worse.  One episode of nausea vomiting after forceful cough.  Room air oxygenation at 77% 9/25: Increased tachypnea.  Fluid challenge administered earlier.  Given IV Lasix.  Chest x-ray obtained showed progressive left lower lobe airspace disease, and persistent bilateral patchy airspace disease, oxygen requirements up to 7 L, CT chest negative for pulmonary emboli.  Moved to stepdown unit, trickle care asked to see for acute on chronic respiratory failure.  9/26: feels better. Still on high FIO2 cough and fever improved. Over all looking better. Neg almost 2 liters over 24 hrs but still > 4 liters + 9/27 still febrile still w/ high FIO2 needs systemic steroids started 9/27 through 9/30: Antibiotics continued.  Still requiring high flow oxygen and BiPAP alternating.  Aspergillosis antibody sent.  Steroid dosing increased on the 28th.  On the 30th still  very hypoxic rapidly desaturating.  Case was discussed with oncology at [facility] as well as [facility], the consensus was to proceed with DHAP chemotherapy, this was toc be initiated at reduced therapy due to recent hematologic toxicity 10/1, 10/2, 10/3 - Improving slowly, off/no BIPAP PRN 10/3 - Down to 45% FiO2, chemo + diuresis  10/4: Continues to feel better.  FiO2 at 50%, using noninvasive much less.  Having nausea, typically has this after chemotherapy.  Serum creatinine climbing  Consults: date of consult/date signed off & final recs:  Pulmonary consulted 9/25  Procedures (surgical and bedside):    Significant Diagnostic Tests:  Echocardiogram from 8/31: Ejection fraction 55 to 60% wall Motion was normal diastolic function normal wall CT angiogram 9/25: Negative for pulmonary edema.  Increased mediastinal adenopathy, hilar adenopathy, and central groundglass opacities.  Also has left pleural effusion.  This looks small to moderate in size.  Pulmonary masses and right axillary adenopathy are stable.  Increased consolidation left lower lobe.  Micro Data: Respiratory virus panel 9/24: Negative Blood cultures obtained 9/23:>>> Urine culture 9/22: Negative  Antimicrobials:  Aztreonam 9/22 through 9/23 Cefepime 9/23>>>9/30 Metronidazole 9/22 through 9/23 Vancomycin 9/22>>>9/30 Diflucan X 5 days, oral thrush   Subjective:  Feeling much better.  Is having intermittent nausea.  Objective   Blood pressure 136/82, pulse 65, temperature (Abnormal) 97 F (36.1 C), resp. rate 20, height 5\' 8"  (1.727 m), weight 78.3 kg, SpO2 95 %.    FiO2 (%):  [45 %-70 %] 65 %   Intake/Output Summary (Last 24 hours) at 10/25/2017 0932 Last data filed at 10/25/2017 0600 Gross per 24 hour  Intake 120 ml  Output 2200 ml  Net -2080 ml   Filed Weights   10/13/17 1907 10/13/17 2121  Weight: 79.4 kg 78.3 kg    Examination: General: 57 year old white male currently resting in bed he is in no acute  distress HEENT normocephalic atraumatic.  No JVD mucous membranes are moist Pulmonary: Improved work of breathing.  Decreased left base.  No rales rhonchi or crackles Cardiac: Regular rate and rhythm without murmur rub or gallop Abdomen: Soft nontender no organomegaly Extremities: Warm and dry trace lower extremity edema brisk capillary refill strong pulses Neuro: Awake oriented no focal deficits  Resolved Hospital Problem list     Assessment & Plan:   Acute on chronic hypoxic respiratory failure in the setting of diffuse pulmonary nodular infiltrates due to lymphoma involvement of the lung - unclear etiology of these nodular infiltrates, we are assuming this is related to his lymphoma - Clinically better following chemotherapy - this could represent an opportunistic infectious process due to his ongoing disease processes  - O2 requirements improved, resp failure improving  Plan Continue to hold off on antibiotics Wean oxygen for saturations greater than 98% Continue to cycle high flow oxygen with BiPAP Slow prednisone taper  Pancytopenia. 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 on Xarelto since 2018 Plan Continue low molecular weight heparin DVT prophylaxis  Acute renal failure w/ rising potassium I suspect that this is secondary to chemotherapy, and also suspect systemic steroids responsible for rising BUN Positive CFB  Plan Encourage oral intake Repeat afternoon chemistry May need IV hydration  Critical Illness Nutritional Support Debility  Plan Continue thiamine and multivitamin  Chemotherapy related nausea Plan Continue Zofran Check QTC daily  Disposition / Summary of Today's Plan 10/25/17     Continue to wean oxygen Major issue at this point is rising creatinine Pushing p.o. Intake We will repeat chemistry this afternoon, may need gentle IV hydration    Diet: regular  Pain/Anxiety/Delirium protocol (if indicated): Not indicated VAP protocol (if indicated): Not indicated DVT prophylaxis: [place] LMWH GI prophylaxis: Not indicated Hyperglycemia protocol: Not indicated Mobility: Bedrest Code Status: Full code Family Communication: Family updated  Labs   CBC: Recent Labs  Lab 10/20/17 0509 10/21/17 0359 10/22/17 0415 10/25/17 0405  WBC 14.7* 13.7* 10.3 30.7*  NEUTROABS 14.3* 12.4* 9.5 29.2*  HGB 8.2* 8.2* 7.8* 7.2*  HCT 23.5* 23.7* 23.3* 20.6*  MCV 91.8 93.3 94.7 92.4  PLT 82* 78* 74* 57*    Basic Metabolic Panel: Recent Labs  Lab 10/19/17 0500 10/20/17 0509 10/21/17 0359 10/22/17 0415 10/24/17 1034 10/25/17 0405  NA 139 141 143 141 140 142  K 3.4* 3.8 4.1 4.5 4.6 4.8  CL 102 104 105 106 101 105  CO2 26 26 28 27 25 26   GLUCOSE 136* 208* 182* 168* 213* 200*  BUN 37* 48* 47* 43* 78* 84*  CREATININE 1.03 1.10 1.03 1.01 1.49* 1.52*  CALCIUM 8.6* 8.9 9.0 8.4* 8.3* 8.6*  MG 1.8  --   --   --  2.4  --   PHOS  --   --   --   --  6.0*  --    GFR: Estimated Creatinine Clearance: 51.9 mL/min (A) (by C-G formula based on SCr of 1.52 mg/dL (H)). Recent Labs  Lab 10/19/17 0500 10/20/17 0509 10/21/17 0359 10/22/17 0415 10/25/17 0405  PROCALCITON 0.70  --   --   --   --   WBC  --  14.7* 13.7* 10.3 30.7*    Liver Function Tests: Recent Labs  Lab 10/20/17 0509 10/21/17 0359 10/22/17 0415 10/24/17 1034 10/25/17 0405  AST 25 58* 26 29 26   ALT 32 69* 51* 41 36  ALKPHOS 48 48 44 41 47  BILITOT 0.7 0.7 0.6 0.4 0.6  PROT 5.3* 5.1* 4.8* 5.1* 4.8*  ALBUMIN 2.2* 2.2* 2.0* 2.1* 2.1*   No results for input(s): LIPASE, AMYLASE in the last 168 hours. No results for input(s): AMMONIA in the last 168 hours.  ABG    Component Value Date/Time   PHART 7.418  10/16/2017 0802   PCO2ART 34.8 10/16/2017 0802   PO2ART 36.3 (LL) 10/16/2017 0802   HCO3 22.0 10/16/2017 0802   ACIDBASEDEF 1.5 10/16/2017 0802   O2SAT 62.9 10/16/2017 0802     Coagulation Profile: No results for input(s): INR, PROTIME in the last 168 hours.  Cardiac Enzymes: No results for input(s): CKTOTAL, CKMB, CKMBINDEX, TROPONINI in the last 168 hours.  HbA1C: No results found for: HGBA1C  CBG: Recent Labs  Lab 10/24/17 1600 10/24/17 1914 10/24/17 2325 10/25/17 0350 10/25/17 0806  GLUCAP 286* 346* 277* 200* 162*

## 2017-10-26 DIAGNOSIS — Z6826 Body mass index (BMI) 26.0-26.9, adult: Secondary | ICD-10-CM

## 2017-10-26 DIAGNOSIS — Z86718 Personal history of other venous thrombosis and embolism: Secondary | ICD-10-CM

## 2017-10-26 DIAGNOSIS — E46 Unspecified protein-calorie malnutrition: Secondary | ICD-10-CM

## 2017-10-26 LAB — BASIC METABOLIC PANEL
ANION GAP: 11 (ref 5–15)
ANION GAP: 11 (ref 5–15)
BUN: 81 mg/dL — ABNORMAL HIGH (ref 6–20)
BUN: 72 mg/dL — ABNORMAL HIGH (ref 6–20)
CALCIUM: 8.6 mg/dL — AB (ref 8.9–10.3)
CHLORIDE: 105 mmol/L (ref 98–111)
CO2: 26 mmol/L (ref 22–32)
CO2: 27 mmol/L (ref 22–32)
Calcium: 8.7 mg/dL — ABNORMAL LOW (ref 8.9–10.3)
Chloride: 100 mmol/L (ref 98–111)
Creatinine, Ser: 1.45 mg/dL — ABNORMAL HIGH (ref 0.61–1.24)
Creatinine, Ser: 1.18 mg/dL (ref 0.61–1.24)
GFR calc Af Amer: >60 mL/min (ref >60)
GFR calc Af Amer: >60 mL/min (ref >60)
GFR, EST NON AFRICAN AMERICAN: 52 mL/min — AB (ref >60)
GLUCOSE: 176 mg/dL — AB (ref 70–99)
Glucose, Bld: 272 mg/dL — ABNORMAL HIGH (ref 70–99)
POTASSIUM: 4.9 mmol/L (ref 3.5–5.1)
POTASSIUM: 4.3 mmol/L (ref 3.5–5.1)
Sodium: 142 mmol/L (ref 135–145)
Sodium: 138 mmol/L (ref 135–145)

## 2017-10-26 LAB — GLUCOSE, CAPILLARY
GLUCOSE-CAPILLARY: 166 mg/dL — AB (ref 70–99)
GLUCOSE-CAPILLARY: 170 mg/dL — AB (ref 70–99)
GLUCOSE-CAPILLARY: 219 mg/dL — AB (ref 70–99)
GLUCOSE-CAPILLARY: 237 mg/dL — AB (ref 70–99)
Glucose-Capillary: 157 mg/dL — ABNORMAL HIGH (ref 70–99)
Glucose-Capillary: 221 mg/dL — ABNORMAL HIGH (ref 70–99)
Glucose-Capillary: 237 mg/dL — ABNORMAL HIGH (ref 70–99)

## 2017-10-26 NOTE — Progress Notes (Signed)
[name]   DOB:[birth year]   YI#:[id number]   JJK#:[id number]  Oncology f/u  Subjective: I am covering my partner Dr. [name] to see pt over the weekend. [name] is a 57 year old Caucasian male, with previous history of large B-cell lymphoma in 2005, achieved remission, and recently diagnosed T-cell lymphoma with diffuse lymphadenopathy and lung involvement in February 2018, status post multiple lines chemotherapy with poor response. He was admitted on 9/22 with neutropenic fever, acute on chronic hypoxic respite failure, was treated with broad antibiotics, on and off BiPAP, chemotherapy was switched to DHAP after admission, cycle 1 on 10/21/17, overall condition improved after chemo.  Patient was sitting in the recliner at bedside this morning, on nasal cannula oxygen FiO2 at 50%, he was on BiPAP for 4 hours last night. He overall feels better.  He has had some nausea after chemo, controlled with Zofran.  No other new symptoms.   Objective:  Vitals:   10/26/17 1500 10/26/17 1555  BP: 120/76   Pulse: 68   Resp: (!) 24   Temp:  98 F (36.7 C)  SpO2: 91%     Body mass index is 26.24 kg/m.  Intake/Output Summary (Last 24 hours) at 10/26/2017 1555 Last data filed at 10/26/2017 3716 Gross per 24 hour  Intake -  Output 1880 ml  Net -1880 ml     Sclerae unicteric  Oropharynx clear  No peripheral adenopathy  Lungs, decreased breath sound in the left lung base, clear -- no rales or rhonchi  Heart regular rate and rhythm  Abdomen benign  MSK no focal spinal tenderness, no peripheral edema  Neuro nonfocal   CBG (last 3)  Recent Labs    10/26/17 0749 10/26/17 1130 10/26/17 1549  GLUCAP 237* 237* 221*     Labs:  Lab Results  Component Value Date   WBC 30.7 (H) 10/25/2017   HGB 7.2 (L) 10/25/2017   HCT 20.6 (L) 10/25/2017   MCV 92.4 10/25/2017   PLT 57 (L) 10/25/2017   NEUTROABS 29.2 (H) 10/25/2017   CMP Latest Ref Rng & Units 10/26/2017 10/25/2017 10/25/2017  Glucose 70 - 99  mg/dL 176(H) 307(H) 252(H)  BUN 6 - 20 mg/dL 81(H) 83(H) 84(H)  Creatinine 0.61 - 1.24 mg/dL 1.45(H) 1.60(H) 1.57(H)  Sodium 135 - 145 mmol/L 142 139 141  Potassium 3.5 - 5.1 mmol/L 4.9 4.7 4.5  Chloride 98 - 111 mmol/L 105 101 104  CO2 22 - 32 mmol/L 26 24 25   Calcium 8.9 - 10.3 mg/dL 8.7(L) 8.7(L) 8.5(L)  Total Protein 6.5 - 8.1 g/dL - - -  Total Bilirubin 0.3 - 1.2 mg/dL - - -  Alkaline Phos 38 - 126 U/L - - -  AST 15 - 41 U/L - - -  ALT 0 - 44 U/L - - -    Urine Studies No results for input(s): UHGB, CRYS in the last 72 hours.  Invalid input(s): UACOL, UAPR, USPG, UPH, UTP, UGL, UKET, UBIL, UNIT, UROB, [place], UEPI, UWBC, Junie Panning [place], [place], [place]  Basic Metabolic Panel: Recent Labs  Lab 10/24/17 1034 10/25/17 0405 10/25/17 0946 10/25/17 1718 10/26/17 0355  NA 140 142 141 139 142  K 4.6 4.8 4.5 4.7 4.9  CL 101 105 104 101 105  CO2 25 26 25 24 26   GLUCOSE 213* 200* 252* 307* 176*  BUN 78* 84* 84* 83* 81*  CREATININE 1.49* 1.52* 1.57* 1.60* 1.45*  CALCIUM 8.3* 8.6* 8.5* 8.7* 8.7*  MG 2.4  --   --   --   --  PHOS 6.0*  --   --   --   --    GFR Estimated Creatinine Clearance: 54.4 mL/min (A) (by C-G formula based on SCr of 1.45 mg/dL (H)). Liver Function Tests: Recent Labs  Lab 10/20/17 0509 10/21/17 0359 10/22/17 0415 10/24/17 1034 10/25/17 0405  AST 25 58* 26 29 26   ALT 32 69* 51* 41 36  ALKPHOS 48 48 44 41 47  BILITOT 0.7 0.7 0.6 0.4 0.6  PROT 5.3* 5.1* 4.8* 5.1* 4.8*  ALBUMIN 2.2* 2.2* 2.0* 2.1* 2.1*   No results for input(s): LIPASE, AMYLASE in the last 168 hours. No results for input(s): AMMONIA in the last 168 hours. Coagulation profile No results for input(s): INR, PROTIME in the last 168 hours.  CBC: Recent Labs  Lab 10/20/17 0509 10/21/17 0359 10/22/17 0415 10/25/17 0405  WBC 14.7* 13.7* 10.3 30.7*  NEUTROABS 14.3* 12.4* 9.5 29.2*  HGB 8.2* 8.2* 7.8* 7.2*  HCT 23.5* 23.7* 23.3* 20.6*  MCV 91.8 93.3 94.7 92.4  PLT 82* 78* 74* 57*    Cardiac Enzymes: No results for input(s): CKTOTAL, CKMB, CKMBINDEX, TROPONINI in the last 168 hours. BNP: Invalid input(s): POCBNP CBG: Recent Labs  Lab 10/26/17 0009 10/26/17 0340 10/26/17 0749 10/26/17 1130 10/26/17 1549  GLUCAP 166* 170* 237* 237* 221*   D-Dimer No results for input(s): DDIMER in the last 72 hours. Hgb A1c No results for input(s): HGBA1C in the last 72 hours. Lipid Profile No results for input(s): CHOL, HDL, LDLCALC, TRIG, CHOLHDL, LDLDIRECT in the last 72 hours. Thyroid function studies No results for input(s): TSH, T4TOTAL, T3FREE, THYROIDAB in the last 72 hours.  Invalid input(s): FREET3 Anemia work up No results for input(s): VITAMINB12, FOLATE, FERRITIN, TIBC, IRON, RETICCTPCT in the last 72 hours. Microbiology No results found for this or any previous visit (from the past 240 hour(s)).    Studies:  No results found.  Assessment: 57 y.o. with T-cell lymphoma, on chemo, admitted for neutropenic fever after chemo, and acute on chronic hypoxic respite failure required BiPAP.  1.  Acute on chronic hypoxic respite failure, secondary to lymphoma in his lungs and possible infection  2.  Pancytopenia, with febrile neutropenia on admission, secondary to chemotherapy, overall improved. 3.  T-cell lymphoma with diffuse adenopathy and lung involvement, s/p multiple lines chemo, recent cycle 1  DHAP (cisplatin and cytarabine) on 9/30 4.  History of superficial DVT, not on anticoagulation 5. Nausea, secondary to chemotherapy 6.  Malnutrition and deconditioning 7.  Leukocytosis secondary to Granix   Plan: -he is clinical stable with slow improvement -we anticipate he will develop neutropenia after chemo DHAP (cisplatin and cytarabine) on 9/30, Dr. [name] has started him on Granix after chemo, will continue. Repeat CBC tomorrow  -nausea is controlled  -appreciate ICU care  -will f/u tomorrow     Truitt Merle, MD 10/26/2017  3:55 PM

## 2017-10-26 NOTE — Progress Notes (Signed)
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  Admitted on 9/22 with neutropenic fever, started on broad-spectrum antibiotics. Critical care consulted 9/25 for acute on chronic hypoxic respiratory failure. Overall, has improved with initiation of chemo. On and off Bipap, O2 requirements have improved.   Significant Hospital Events    9/23: Infectious disease consulted.  Had developed mild worsening and chronic cough and shortness of breath recommended to continue vancomycin and cefepime possible pneumonia.  Was given Granix per hematology with plans to continue this until [place] was over 1000.  Chest x-ray with diffuse bilateral pulmonary infiltrates chronic in nature since late Aug 2019, 9/24: Having intermittent fever, cough a little bit worse.  One episode of nausea vomiting after forceful cough.  Room air oxygenation at 77% 9/25: Increased tachypnea.  Fluid challenge administered earlier.  Given IV Lasix.  Chest x-ray obtained showed progressive left lower lobe airspace disease, and persistent bilateral patchy airspace disease, oxygen requirements up to 7 L, CT chest negative for pulmonary emboli.  Moved to stepdown unit, trickle care asked to see for acute on chronic respiratory failure.  9/26: feels better. Still on high FIO2 cough and fever improved. Over all looking better. Neg almost 2 liters over 24 hrs but still > 4 liters + 9/27 still febrile still w/ high FIO2 needs systemic steroids started 9/27 through 9/30: Antibiotics continued.  Still requiring high flow oxygen and BiPAP alternating.  Aspergillosis antibody sent.  Steroid dosing increased on the 28th.  On the 30th still  very hypoxic rapidly desaturating.  Case was discussed with oncology at [facility] as well as [facility], the consensus was to proceed with DHAP chemotherapy, this was toc be initiated at reduced therapy due to recent hematologic toxicity 10/1, 10/2, 10/3 - Improving slowly, off/no BIPAP PRN 10/3 - Down to 45% FiO2, chemo + diuresis  10/4 - Continues to feel better.  FiO2 at 50%, using noninvasive much less.  Having nausea, typically has this after chemotherapy.  Serum creatinine climbing 10/5 - Doing well over night, only on BIPAP for 4 hours   Consults: date of consult/date signed off & final recs:  Pulmonary consulted 9/25  Procedures (surgical and bedside):    Significant Diagnostic Tests:  Echocardiogram from 8/31: Ejection fraction 55 to 60% wall Motion was normal diastolic function normal wall  CT angiogram 9/25: Negative for pulmonary edema.  Increased mediastinal adenopathy, hilar adenopathy, and central groundglass opacities.  Also has left pleural effusion.  This looks small to moderate in size.  Pulmonary masses and right axillary adenopathy are stable.  Increased consolidation left lower lobe.  Micro Data: Respiratory virus panel 9/24: Negative Blood cultures obtained 9/23:>>> Urine culture 9/22: Negative  Antimicrobials:  Aztreonam 9/22 through 9/23 Cefepime 9/23>>>9/30 Metronidazole 9/22 through 9/23 Vancomycin 9/22>>>9/30 Diflucan X 5 days, oral thrush   Subjective:  Doing well this morning. No complaints. I spoke with family this morning. Care discussed with wife and daugther.   Objective   Blood pressure (!) 144/77, pulse 65, temperature (!) 97.5 F (36.4 C), temperature source Oral, resp. rate 20, height 5\' 8"  (1.727 m), weight 78.3 kg, SpO2 94 %.    FiO2 (%):  [  50 %-65 %] 60 %   Intake/Output Summary (Last 24 hours) at 10/26/2017 0902 Last data filed at 10/26/2017 3500 Gross per 24 hour  Intake -  Output 2280 ml  Net -2280 ml   Filed Weights    10/13/17 1907 10/13/17 2121  Weight: 79.4 kg 78.3 kg    Examination: General appearance: 57 y.o., male, NAD, conversant  Eyes: anicteric sclerae, moist conjunctivae; tracking  HENT: NCAT; oropharynx, MMM, no thrush  Neck: Trachea midline; no jVD Lungs: diminished BL bases, few insp crackles, no wheeze  CV: RRR, S1, S2, no MRGs  Abdomen: Soft, non-tender; non-distended, BS present  Extremities: BL LE edema, radial and DP pulses present bilaterally  Skin: Normal temperature, turgor and texture; no rash Psych: Appropriate affect, normal mood  Neuro: Alert and oriented to person and place, no focal deficit   Resolved Hospital Problem list     Assessment & Plan:   Acute on chronic hypoxic respiratory failure in the setting of diffuse pulmonary nodular infiltrates due to lymphoma involvement of the lung - unclear etiology of these nodular infiltrates, we are assuming this is related to his lymphoma - Clinically better following chemotherapy - this could represent an opportunistic infectious process due to his ongoing disease processes  - O2 requirements improved, resp failure improving  Plan Continue to hold off on abx, doing well  Wean oxygen, tolerating HHFNC  Attempt to wean FiO2 as much as possible, maintain Spo2  Up in chair if tolerated. We will discuss switching to nasal cannula with RT if possible.  Pancytopenia. W/ febrile neutropenia Now with leukocytosis on 10/4 - ANA and WBC increased after Neupogen  Plan Checking blood counts per oncology.   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 Encourage fluid intake Follow electrolytes daily, will replace as  needed.  Critical Illness Nutritional Support Debility  Plan Thiamine multivitamin Advance diet as tolerated to regular  Chemotherapy related nausea Plan As needed Zofran OtC 460s stable   Disposition / Summary of Today's Plan 10/26/17   UP in chair  Transition to [place] if possible     Diet: regular  Pain/Anxiety/Delirium protocol (if indicated): Not indicated VAP protocol (if indicated): Not indicated DVT prophylaxis: [place] LMWH GI prophylaxis: Not indicated Hyperglycemia protocol: Not indicated Mobility: Bedrest Code Status: Full code Family Communication: Family updated  Labs   CBC: Recent Labs  Lab 10/20/17 0509 10/21/17 0359 10/22/17 0415 10/25/17 0405  WBC 14.7* 13.7* 10.3 30.7*  NEUTROABS 14.3* 12.4* 9.5 29.2*  HGB 8.2* 8.2* 7.8* 7.2*  HCT 23.5* 23.7* 23.3* 20.6*  MCV 91.8 93.3 94.7 92.4  PLT 82* 78* 74* 57*    Basic Metabolic Panel: Recent Labs  Lab 10/24/17 1034 10/25/17 0405 10/25/17 0946 10/25/17 1718 10/26/17 0355  NA 140 142 141 139 142  K 4.6 4.8 4.5 4.7 4.9  CL 101 105 104 101 105  CO2 25 26 25 24 26   GLUCOSE 213* 200* 252* 307* 176*  BUN 78* 84* 84* 83* 81*  CREATININE 1.49* 1.52* 1.57* 1.60* 1.45*  CALCIUM 8.3* 8.6* 8.5* 8.7* 8.7*  MG 2.4  --   --   --   --   PHOS 6.0*  --   --   --   --  GFR: Estimated Creatinine Clearance: 54.4 mL/min (A) (by C-G formula based on SCr of 1.45 mg/dL (H)). Recent Labs  Lab 10/20/17 0509 10/21/17 0359 10/22/17 0415 10/25/17 0405  WBC 14.7* 13.7* 10.3 30.7*    Liver Function Tests: Recent Labs  Lab 10/20/17 0509 10/21/17 0359 10/22/17 0415 10/24/17 1034 10/25/17 0405  AST 25 58* 26 29 26   ALT 32 69* 51* 41 36  ALKPHOS 48 48 44 41 47  BILITOT 0.7 0.7 0.6 0.4 0.6  PROT 5.3* 5.1* 4.8* 5.1* 4.8*  ALBUMIN 2.2* 2.2* 2.0* 2.1* 2.1*   No results for input(s): LIPASE, AMYLASE in the last 168 hours. No results for input(s): AMMONIA in the last 168 hours.  ABG    Component Value Date/Time    PHART 7.418 10/16/2017 0802   PCO2ART 34.8 10/16/2017 0802   PO2ART 36.3 (LL) 10/16/2017 0802   HCO3 22.0 10/16/2017 0802   ACIDBASEDEF 1.5 10/16/2017 0802   O2SAT 62.9 10/16/2017 0802     Coagulation Profile: No results for input(s): INR, PROTIME in the last 168 hours.  Cardiac Enzymes: No results for input(s): CKTOTAL, CKMB, CKMBINDEX, TROPONINI in the last 168 hours.  HbA1C: No results found for: HGBA1C  CBG: Recent Labs  Lab 10/25/17 1650 10/25/17 2001 10/26/17 0009 10/26/17 0340 10/26/17 0749  GLUCAP 298* 217* 166* 170* 237*   ___________________________________________________________________________  Garner Nash, DO [facility] 10/26/2017 9:32 AM  Personal pager: [phone] If unanswered, please page CCM On-call: [phone]

## 2017-10-27 DIAGNOSIS — T451X5A Adverse effect of antineoplastic and immunosuppressive drugs, initial encounter: Secondary | ICD-10-CM

## 2017-10-27 DIAGNOSIS — C8339 Diffuse large B-cell lymphoma, extranodal and solid organ sites: Secondary | ICD-10-CM

## 2017-10-27 DIAGNOSIS — D701 Agranulocytosis secondary to cancer chemotherapy: Secondary | ICD-10-CM

## 2017-10-27 DIAGNOSIS — R110 Nausea: Secondary | ICD-10-CM

## 2017-10-27 LAB — CBC WITH DIFFERENTIAL/PLATELET
BASOS ABS: 0 10*3/uL (ref 0.0–0.1)
BASOS PCT: 0 %
EOS PCT: 0 %
Eosinophils Absolute: 0 10*3/uL (ref 0.0–0.7)
HEMATOCRIT: 19.9 % — AB (ref 39.0–52.0)
HEMOGLOBIN: 6.9 g/dL — AB (ref 13.0–17.0)
LYMPHS PCT: 0 %
Lymphs Abs: 0 10*3/uL — ABNORMAL LOW (ref 0.7–4.0)
MCH: 32.2 pg (ref 26.0–34.0)
MCHC: 34.7 g/dL (ref 30.0–36.0)
MCV: 93 fL (ref 78.0–100.0)
MONOS PCT: 1 %
Monocytes Absolute: 0.2 10*3/uL (ref 0.1–1.0)
Neutro Abs: 17 10*3/uL — ABNORMAL HIGH (ref 1.7–7.7)
Neutrophils Relative %: 99 %
Platelets: 40 10*3/uL — ABNORMAL LOW (ref 150–400)
RBC: 2.14 MIL/uL — ABNORMAL LOW (ref 4.22–5.81)
RDW: 15.3 % (ref 11.5–15.5)
WBC: 17.2 10*3/uL — ABNORMAL HIGH (ref 4.0–10.5)

## 2017-10-27 LAB — GLUCOSE, CAPILLARY
GLUCOSE-CAPILLARY: 142 mg/dL — AB (ref 70–99)
GLUCOSE-CAPILLARY: 177 mg/dL — AB (ref 70–99)
GLUCOSE-CAPILLARY: 147 mg/dL — AB (ref 70–99)
Glucose-Capillary: 166 mg/dL — ABNORMAL HIGH (ref 70–99)
Glucose-Capillary: 278 mg/dL — ABNORMAL HIGH (ref 70–99)
Glucose-Capillary: 216 mg/dL — ABNORMAL HIGH (ref 70–99)

## 2017-10-27 LAB — PREPARE RBC (CROSSMATCH)

## 2017-10-27 MED ORDER — SODIUM CHLORIDE 0.9% IV SOLUTION
Freq: Once | INTRAVENOUS | Status: AC
  Administered 2017-10-27: 12:00:00 via INTRAVENOUS

## 2017-10-27 MED ORDER — DEXAMETHASONE 0.1 % OP SUSP
2.0000 [drp] | Freq: Four times a day (QID) | OPHTHALMIC | Status: DC
  Administered 2017-10-27 – 2017-10-28 (×3): 2 [drp] via OPHTHALMIC
  Filled 2017-10-27: qty 5

## 2017-10-27 MED ORDER — FUROSEMIDE 10 MG/ML IJ SOLN
20.0000 mg | Freq: Once | INTRAMUSCULAR | Status: AC
  Administered 2017-10-27: 20 mg via INTRAVENOUS
  Filled 2017-10-27: qty 2

## 2017-10-27 NOTE — Progress Notes (Signed)
Pt and wife stated they do not want patient to receive insulin for a blood sugar less than 200.  Pt and wife said they spoke with oncologist who said that would be fine.  Charted that patient refused insulin.  RN did provide education related to importance of keeping blood sugars under tight control because of risk of infection.  Pt and wife verbalized understanding.

## 2017-10-27 NOTE — Progress Notes (Signed)
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  Admitted on 9/22 with neutropenic fever, started on broad-spectrum antibiotics. Critical care consulted 9/25 for acute on chronic hypoxic respiratory failure. Overall, has improved with initiation of chemo. On and off Bipap, O2 requirements have improved.   Significant Hospital Events    9/23: Infectious disease consulted.  Had developed mild worsening and chronic cough and shortness of breath recommended to continue vancomycin and cefepime possible pneumonia.  Was given Granix per hematology with plans to continue this until [place] was over 1000.  Chest x-ray with diffuse bilateral pulmonary infiltrates chronic in nature since late Aug 2019, 9/24: Having intermittent fever, cough a little bit worse.  One episode of nausea vomiting after forceful cough.  Room air oxygenation at 77% 9/25: Increased tachypnea.  Fluid challenge administered earlier.  Given IV Lasix.  Chest x-ray obtained showed progressive left lower lobe airspace disease, and persistent bilateral patchy airspace disease, oxygen requirements up to 7 L, CT chest negative for pulmonary emboli.  Moved to stepdown unit, trickle care asked to see for acute on chronic respiratory failure.  9/26: feels better. Still on high FIO2 cough and fever improved. Over all looking better. Neg almost 2 liters over 24 hrs but still > 4 liters + 9/27 still febrile still w/ high FIO2 needs systemic steroids started 9/27 through 9/30: Antibiotics continued.  Still requiring high flow oxygen and BiPAP alternating.  Aspergillosis antibody sent.  Steroid dosing increased on the 28th.  On the 30th still  very hypoxic rapidly desaturating.  Case was discussed with oncology at [facility] as well as [facility], the consensus was to proceed with DHAP chemotherapy, this was toc be initiated at reduced therapy due to recent hematologic toxicity 10/1, 10/2, 10/3 - Improving slowly, off/no BIPAP PRN 10/3 - Down to 45% FiO2, chemo + diuresis  10/4 - Continues to feel better.  FiO2 at 50%, using noninvasive much less.  Having nausea, typically has this after chemotherapy.  Serum creatinine climbing 10/5 - Doing well over night, only on BIPAP for 4 hours  10/6 - down to [facility]   Consults: date of consult/date signed off & final recs:  Pulmonary consulted 9/25  Procedures (surgical and bedside):    Significant Diagnostic Tests:  Echocardiogram from 8/31: Ejection fraction 55 to 60% wall Motion was normal diastolic function normal wall  CT angiogram 9/25: Negative for pulmonary edema.  Increased mediastinal adenopathy, hilar adenopathy, and central groundglass opacities.  Also has left pleural effusion.  This looks small to moderate in size.  Pulmonary masses and right axillary adenopathy are stable.  Increased consolidation left lower lobe.  Micro Data: Respiratory virus panel 9/24: Negative Blood cultures obtained 9/23:>>> Urine culture 9/22: Negative  Antimicrobials:  Aztreonam 9/22 through 9/23 Cefepime 9/23>>>9/30 Metronidazole 9/22 through 9/23 Vancomycin 9/22>>>9/30 Diflucan X 5 days, oral thrush   Subjective:  Doing well overnight. No issues. Greatly improved. Down to [facility] this morning!  Objective   Blood pressure 137/81, pulse 71, temperature (!) 97.5 F (36.4 C), temperature source Oral, resp. rate (!) 24, height 5\' 8"  (1.727 m), weight 78.3 kg, SpO2 90 %.    FiO2 (%):  [50 %]  50 %   Intake/Output Summary (Last 24 hours) at 10/27/2017 5361 Last data filed at 10/27/2017 0500 Gross per 24 hour  Intake -  Output 1575 ml  Net -1575 ml   Filed Weights   10/13/17 1907 10/13/17  2121  Weight: 79.4 kg 78.3 kg    Examination: General appearance: 57 y.o., male, NAD, conversant  Eyes: anicteric sclerae, moist conjunctivae; tracking  HENT: NCAT; oropharynx, MMM, no thrush  Neck: Trachea midline; no jv  Lungs: left basilar crackles, no wheeze CV: RRR, S1, S2, no MRGs  Abdomen: Soft, non-tender; non-distended, BS present  Extremities: BL LE edema, radial and DP pulses present bilaterally  Skin: Normal temperature, turgor and texture; no rash Psych: Appropriate affect, normal mood  Neuro: Alert and oriented to person and place, no focal deficit   Resolved Hospital Problem list     Assessment & Plan:   Acute on chronic hypoxic respiratory failure in the setting of diffuse pulmonary nodular infiltrates due to lymphoma involvement of the lung - unclear etiology of these nodular infiltrates, we are assuming this is related to his lymphoma - greatly improved, slowly weaning FiO2, down to [facility] this AM  Plan Holding abx  Weaning FiO2, 6LNC this AM  SpO2 goal >88% Up in chair, up with assist   Pancytopenia. W/ febrile neutropenia Now with leukocytosis on 10/4 - ANA and WBC increased after Neupogen  Plan Following blood counts per oncology   Steroid related hyperglycemia Plan SSI, CBGs every 4  T-cell lymphoma with metastasis to lung Plan Chemo regimen per oncology Patient hopes to continue potential enrollment in car T-cell therapy at [facility].  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  Up with  assist.    Diet: regular  Pain/Anxiety/Delirium protocol (if indicated): Not indicated VAP protocol (if indicated): Not indicated DVT prophylaxis: [place] LMWH GI prophylaxis: Not indicated Hyperglycemia protocol: Not indicated Mobility: Bedrest Code Status: Full code Family Communication: Family updated  Labs   CBC: Recent Labs  Lab 10/21/17 0359 10/22/17 0415 10/25/17 0405  WBC 13.7* 10.3 30.7*  NEUTROABS 12.4* 9.5 29.2*  HGB 8.2* 7.8* 7.2*  HCT 23.7* 23.3* 20.6*  MCV 93.3 94.7 92.4  PLT 78* 74* 57*    Basic Metabolic Panel: Recent Labs  Lab 10/24/17 1034 10/25/17 0405 10/25/17 0946 10/25/17 1718 10/26/17 0355 10/26/17 1700  NA 140 142 141 139 142 138  K 4.6 4.8 4.5 4.7 4.9 4.3  CL 101 105 104 101 105 100  CO2 25 26 25 24 26 27   GLUCOSE 213* 200* 252* 307* 176* 272*  BUN 78* 84* 84* 83* 81* 72*  CREATININE 1.49* 1.52* 1.57* 1.60* 1.45* 1.18  CALCIUM 8.3* 8.6* 8.5* 8.7* 8.7* 8.6*  MG 2.4  --   --   --   --   --   PHOS 6.0*  --   --   --   --   --    GFR: Estimated Creatinine Clearance: 66.8 mL/min (by C-G formula based on SCr of 1.18 mg/dL). Recent Labs  Lab 10/21/17 0359 10/22/17 0415 10/25/17 0405  WBC 13.7*  10.3 30.7*    Liver Function Tests: Recent Labs  Lab 10/21/17 0359 10/22/17 0415 10/24/17 1034 10/25/17 0405  AST 58* 26 29 26   ALT 69* 51* 41 36  ALKPHOS 48 44 41 47  BILITOT 0.7 0.6 0.4 0.6  PROT 5.1* 4.8* 5.1* 4.8*  ALBUMIN 2.2* 2.0* 2.1* 2.1*   No results for input(s): LIPASE, AMYLASE in the last 168 hours. No results for input(s): AMMONIA in the last 168 hours.  ABG    Component Value Date/Time   PHART 7.418 10/16/2017 0802   PCO2ART 34.8 10/16/2017 0802   PO2ART 36.3 (LL) 10/16/2017 0802   HCO3 22.0 10/16/2017 0802   ACIDBASEDEF 1.5 10/16/2017 0802   O2SAT 62.9 10/16/2017 0802     Coagulation Profile: No results for input(s): INR, PROTIME in the last 168 hours.  Cardiac Enzymes: No results for input(s): CKTOTAL, CKMB,  CKMBINDEX, TROPONINI in the last 168 hours.  HbA1C: No results found for: HGBA1C  CBG: Recent Labs  Lab 10/26/17 1549 10/26/17 2051 10/26/17 2353 10/27/17 0304 10/27/17 0753  GLUCAP 221* 219* 157* 166* 142*   ___________________________________________________________________________  >50% of the visit time was spent face-to-face discussing the patients care plans with the patient, nursing, as well as the patients family at bedside.    Garner Nash, DO [facility] 10/27/2017 8:08 AM  Personal pager: [phone] If unanswered, please page CCM On-call: [phone]

## 2017-10-27 NOTE — Progress Notes (Signed)
[name]   DOB:Nov 21, 1960   DP#:[id number]   WER#:[id number]  Oncology f/u  Subjective: [name] continues to improve, did not require BiPAP last night.  Currently on nasal cannula 6L/min, he was sitting in the chair comfortably, with his family and friends.  Afebrile, vital signs stable.  Hemoglobin dropped to 6.9 this morning, from 7.22 days ago, no overt bleeding.  No other new complaints.   Objective:  Vitals:   10/27/17 1033 10/27/17 1200  BP:  128/74  Pulse:  70  Resp:  (!) 22  Temp:  98 F (36.7 C)  SpO2: 92% 94%    Body mass index is 26.24 kg/m.  Intake/Output Summary (Last 24 hours) at 10/27/2017 1358 Last data filed at 10/27/2017 0500 Gross per 24 hour  Intake -  Output 1375 ml  Net -1375 ml     Sclerae unicteric  Oropharynx clear  No peripheral adenopathy  Lungs, decreased breath sound in the left lung base, clear -- no rales or rhonchi  Heart regular rate and rhythm  Abdomen benign  MSK no focal spinal tenderness, no peripheral edema  Neuro nonfocal   CBG (last 3)  Recent Labs    10/27/17 0304 10/27/17 0753 10/27/17 1157  GLUCAP 166* 142* 278*     Labs:  Lab Results  Component Value Date   WBC 17.2 (H) 10/27/2017   HGB 6.9 (LL) 10/27/2017   HCT 19.9 (L) 10/27/2017   MCV 93.0 10/27/2017   PLT 40 (L) 10/27/2017   NEUTROABS 17.0 (H) 10/27/2017   CMP Latest Ref Rng & Units 10/26/2017 10/26/2017 10/25/2017  Glucose 70 - 99 mg/dL 272(H) 176(H) 307(H)  BUN 6 - 20 mg/dL 72(H) 81(H) 83(H)  Creatinine 0.61 - 1.24 mg/dL 1.18 1.45(H) 1.60(H)  Sodium 135 - 145 mmol/L 138 142 139  Potassium 3.5 - 5.1 mmol/L 4.3 4.9 4.7  Chloride 98 - 111 mmol/L 100 105 101  CO2 22 - 32 mmol/L 27 26 24   Calcium 8.9 - 10.3 mg/dL 8.6(L) 8.7(L) 8.7(L)  Total Protein 6.5 - 8.1 g/dL - - -  Total Bilirubin 0.3 - 1.2 mg/dL - - -  Alkaline Phos 38 - 126 U/L - - -  AST 15 - 41 U/L - - -  ALT 0 - 44 U/L - - -    Urine Studies No results for input(s): UHGB, CRYS in the last 72  hours.  Invalid input(s): UACOL, UAPR, USPG, UPH, UTP, UGL, UKET, UBIL, UNIT, UROB, [place], UEPI, UWBC, [place], [place], [place], [place], [place]  Basic Metabolic Panel: Recent Labs  Lab 10/24/17 1034 10/25/17 0405 10/25/17 0946 10/25/17 1718 10/26/17 0355 10/26/17 1700  NA 140 142 141 139 142 138  K 4.6 4.8 4.5 4.7 4.9 4.3  CL 101 105 104 101 105 100  CO2 25 26 25 24 26 27   GLUCOSE 213* 200* 252* 307* 176* 272*  BUN 78* 84* 84* 83* 81* 72*  CREATININE 1.49* 1.52* 1.57* 1.60* 1.45* 1.18  CALCIUM 8.3* 8.6* 8.5* 8.7* 8.7* 8.6*  MG 2.4  --   --   --   --   --   PHOS 6.0*  --   --   --   --   --    GFR Estimated Creatinine Clearance: 66.8 mL/min (by C-G formula based on SCr of 1.18 mg/dL). Liver Function Tests: Recent Labs  Lab 10/21/17 0359 10/22/17 0415 10/24/17 1034 10/25/17 0405  AST 58* 26 29 26   ALT 69* 51* 41 36  ALKPHOS 48 44 41  47  BILITOT 0.7 0.6 0.4 0.6  PROT 5.1* 4.8* 5.1* 4.8*  ALBUMIN 2.2* 2.0* 2.1* 2.1*   No results for input(s): LIPASE, AMYLASE in the last 168 hours. No results for input(s): AMMONIA in the last 168 hours. Coagulation profile No results for input(s): INR, PROTIME in the last 168 hours.  CBC: Recent Labs  Lab 10/21/17 0359 10/22/17 0415 10/25/17 0405 10/27/17 0840  WBC 13.7* 10.3 30.7* 17.2*  NEUTROABS 12.4* 9.5 29.2* 17.0*  HGB 8.2* 7.8* 7.2* 6.9*  HCT 23.7* 23.3* 20.6* 19.9*  MCV 93.3 94.7 92.4 93.0  PLT 78* 74* 57* 40*   Cardiac Enzymes: No results for input(s): CKTOTAL, CKMB, CKMBINDEX, TROPONINI in the last 168 hours. BNP: Invalid input(s): POCBNP CBG: Recent Labs  Lab 10/26/17 2051 10/26/17 2353 10/27/17 0304 10/27/17 0753 10/27/17 1157  GLUCAP 219* 157* 166* 142* 278*   D-Dimer No results for input(s): DDIMER in the last 72 hours. Hgb A1c No results for input(s): HGBA1C in the last 72 hours. Lipid Profile No results for input(s): CHOL, HDL, LDLCALC, TRIG, CHOLHDL, LDLDIRECT in the last 72 hours. Thyroid function  studies No results for input(s): TSH, T4TOTAL, T3FREE, THYROIDAB in the last 72 hours.  Invalid input(s): FREET3 Anemia work up No results for input(s): VITAMINB12, FOLATE, FERRITIN, TIBC, IRON, RETICCTPCT in the last 72 hours. Microbiology No results found for this or any previous visit (from the past 240 hour(s)).    Studies:  No results found.  Assessment: 56 y.o. with T-cell lymphoma, on chemo, admitted for neutropenic fever after chemo, and acute on chronic hypoxic respite failure required BiPAP.  1.  Acute on chronic hypoxic respite failure, secondary to lymphoma in his lungs and possible infection  2.  Pancytopenia, with febrile neutropenia on admission, secondary to chemotherapy, overall improved. 3.  T-cell lymphoma with diffuse adenopathy and lung involvement, s/p multiple lines chemo, recent cycle 1  DHAP (cisplatin and cytarabine) on 9/30 4.  History of superficial DVT, not on anticoagulation 5. Nausea, secondary to chemotherapy, controlled  6.  Malnutrition and deconditioning 7.  Leukocytosis secondary to Granix   Plan: -I recommend 2u blood transfusion, irradiated RBC (I spoke with his nurse) with 3 hr infusion each unit. His anemia is likely related to his chemo and underline malignancy and infection, no signs of bleeding. Due his pulmonary condition, he would probably need some lasix after blood transfusion, ICU team has ordered 20mg  lasix after transfusion  -Continue daily Granix injection, he still has leukocytosis, but is trending down, we anticipate he will develop neutropenia after chemo. -he is clinically improving, continue supportive care and monitoring -Dr. Benay Lee will resume care tomorrow    Truitt Merle, MD 10/27/2017  1:58 PM

## 2017-10-27 NOTE — Progress Notes (Signed)
Second unit prbcs completed 2307.  Lasix given.  Pt requested condom catheter so he could get more rest.  Pt refused to have foot board on bed.  Pt cautioned about sliding down in bed while sleeping.  Pt stated he understood but still would like to have foot bed removed.  Will monitor closely.  Pts bp slightly elevated after prbc infusion, will monitor closely

## 2017-10-28 ENCOUNTER — Inpatient Hospital Stay: Payer: 59

## 2017-10-28 ENCOUNTER — Other Ambulatory Visit: Payer: Self-pay

## 2017-10-28 ENCOUNTER — Ambulatory Visit: Payer: Self-pay

## 2017-10-28 ENCOUNTER — Ambulatory Visit: Payer: Self-pay | Admitting: Nurse Practitioner

## 2017-10-28 DIAGNOSIS — Z9221 Personal history of antineoplastic chemotherapy: Secondary | ICD-10-CM

## 2017-10-28 LAB — COMPREHENSIVE METABOLIC PANEL
ALBUMIN: 2.4 g/dL — AB (ref 3.5–5.0)
ALK PHOS: 57 U/L (ref 38–126)
ALT: 30 U/L (ref 0–44)
ANION GAP: 12 (ref 5–15)
AST: 28 U/L (ref 15–41)
BILIRUBIN TOTAL: 0.7 mg/dL (ref 0.3–1.2)
BUN: 53 mg/dL — ABNORMAL HIGH (ref 6–20)
CALCIUM: 8.7 mg/dL — AB (ref 8.9–10.3)
CO2: 29 mmol/L (ref 22–32)
CREATININE: 1.06 mg/dL (ref 0.61–1.24)
Chloride: 100 mmol/L (ref 98–111)
GFR calc non Af Amer: >60 mL/min (ref >60)
GLUCOSE: 153 mg/dL — AB (ref 70–99)
Potassium: 4.1 mmol/L (ref 3.5–5.1)
Sodium: 141 mmol/L (ref 135–145)
TOTAL PROTEIN: 5.5 g/dL — AB (ref 6.5–8.1)

## 2017-10-28 LAB — BPAM RBC
Blood Product Expiration Date: 201911012359
Blood Product Expiration Date: 201911022359
ISSUE DATE / TIME: 201910062023
ISSUE DATE / TIME: 201910061547
Unit Type and Rh: 6200
Unit Type and Rh: 6200

## 2017-10-28 LAB — CBC WITH DIFFERENTIAL/PLATELET
Basophils Absolute: 0 10*3/uL (ref 0.0–0.1)
Basophils Relative: 0 %
Eosinophils Absolute: 0 10*3/uL (ref 0.0–0.7)
Eosinophils Relative: 0 %
HEMATOCRIT: 28.8 % — AB (ref 39.0–52.0)
HEMOGLOBIN: 10.1 g/dL — AB (ref 13.0–17.0)
LYMPHS PCT: 0 %
Lymphs Abs: 0 10*3/uL — ABNORMAL LOW (ref 0.7–4.0)
MCH: 32.1 pg (ref 26.0–34.0)
MCHC: 35.1 g/dL (ref 30.0–36.0)
MCV: 91.4 fL (ref 78.0–100.0)
Monocytes Absolute: 0.1 10*3/uL (ref 0.1–1.0)
Monocytes Relative: 1 %
NEUTROS PCT: 99 %
Neutro Abs: 11.6 10*3/uL — ABNORMAL HIGH (ref 1.7–7.7)
Platelets: 29 10*3/uL — CL (ref 150–400)
RBC: 3.15 MIL/uL — ABNORMAL LOW (ref 4.22–5.81)
RDW: 14.5 % (ref 11.5–15.5)
WBC: 11.7 10*3/uL — ABNORMAL HIGH (ref 4.0–10.5)

## 2017-10-28 LAB — TYPE AND SCREEN
ABO/RH(D): A POS
Antibody Screen: NEGATIVE
Unit division: 0
Unit division: 0

## 2017-10-28 LAB — GLUCOSE, CAPILLARY
GLUCOSE-CAPILLARY: 237 mg/dL — AB (ref 70–99)
Glucose-Capillary: 151 mg/dL — ABNORMAL HIGH (ref 70–99)
Glucose-Capillary: 138 mg/dL — ABNORMAL HIGH (ref 70–99)
Glucose-Capillary: 260 mg/dL — ABNORMAL HIGH (ref 70–99)
Glucose-Capillary: 178 mg/dL — ABNORMAL HIGH (ref 70–99)

## 2017-10-28 LAB — MAGNESIUM: Magnesium: 1.9 mg/dL (ref 1.7–2.4)

## 2017-10-28 MED ORDER — ONDANSETRON HCL 8 MG PO TABS: 8.0000 mg | ORAL_TABLET | Freq: Three times a day (TID) | ORAL | Status: DC | PRN

## 2017-10-28 MED ORDER — PROCHLORPERAZINE MALEATE 10 MG PO TABS: 10.0000 mg | ORAL_TABLET | Freq: Four times a day (QID) | ORAL | Status: DC | PRN

## 2017-10-28 MED ORDER — BOOST PLUS PO LIQD
237.0000 mL | Freq: Every day | ORAL | Status: DC | PRN
  Filled 2017-10-28: qty 237

## 2017-10-28 MED ORDER — FUROSEMIDE 10 MG/ML IJ SOLN
20.0000 mg | Freq: Once | INTRAMUSCULAR | Status: AC
  Administered 2017-10-28: 20 mg via INTRAVENOUS
  Filled 2017-10-28: qty 2

## 2017-10-28 MED ORDER — INSULIN ASPART 100 UNIT/ML ~~LOC~~ SOLN: 0.0000 [IU] | Freq: Every morning | SUBCUTANEOUS | Status: DC

## 2017-10-28 MED ORDER — PANTOPRAZOLE SODIUM 40 MG PO TBEC
40.0000 mg | DELAYED_RELEASE_TABLET | Freq: Every day | ORAL | Status: DC
  Administered 2017-10-28 – 2017-11-02 (×6): 40 mg via ORAL
  Filled 2017-10-28 (×6): qty 1

## 2017-10-28 MED ORDER — BOOST / RESOURCE BREEZE PO LIQD CUSTOM
1.0000 | Freq: Three times a day (TID) | ORAL | Status: DC
  Administered 2017-10-28 – 2017-11-02 (×8): 1 via ORAL

## 2017-10-28 NOTE — Progress Notes (Signed)
[facility] Progress Note Patient Name: [name] DOB: 1960-06-22 MRN: [id number]   Date of Service  10/28/2017  HPI/Events of Note  Request for AM lab orders.   eICU Interventions  Will order: 1. CBC with Platelets, Mg++ level and CMP now.      Intervention Category Major Interventions: Other:  Magenta Schmiesing Cornelia Copa 10/28/2017, 1:58 AM

## 2017-10-28 NOTE — Progress Notes (Addendum)
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  Admitted on 9/22 with neutropenic fever, started on broad-spectrum antibiotics. Critical care consulted 9/25 for acute on chronic hypoxic respiratory failure. Overall, has improved with initiation of chemo. On and off Bipap, O2 requirements have improved.   Significant Hospital Events    9/23: Infectious disease consulted.  Had developed mild worsening and chronic cough and shortness of breath recommended to continue vancomycin and cefepime possible pneumonia.  Was given Granix per hematology with plans to continue this until [place] was over 1000.  Chest x-ray with diffuse bilateral pulmonary infiltrates chronic in nature since late Aug 2019, 9/24: Having intermittent fever, cough a little bit worse.  One episode of nausea vomiting after forceful cough.  Room air oxygenation at 77% 9/25: Increased tachypnea.  Fluid challenge administered earlier.  Given IV Lasix.  Chest x-ray obtained showed progressive left lower lobe airspace disease, and persistent bilateral patchy airspace disease, oxygen requirements up to 7 L, CT chest negative for pulmonary emboli.  Moved to stepdown unit, trickle care asked to see for acute on chronic respiratory failure.  9/26: feels better. Still on high FIO2 cough and fever improved. Over all looking better. Neg almost 2 liters over 24 hrs but still > 4 liters + 9/27 still febrile still w/ high FIO2 needs systemic steroids started 9/27 through 9/30: Antibiotics continued.  Still requiring high flow oxygen and BiPAP alternating.  Aspergillosis antibody sent.  Steroid dosing increased on the 28th.  On the 30th still  very hypoxic rapidly desaturating.  Case was discussed with oncology at [facility] as well as [facility], the consensus was to proceed with DHAP chemotherapy, this was toc be initiated at reduced therapy due to recent hematologic toxicity 10/1, 10/2, 10/3 - Improving slowly, off/no BIPAP PRN 10/3 - Down to 45% FiO2, chemo + diuresis  10/4 - Continues to feel better.  FiO2 at 50%, using noninvasive much less.  Having nausea, typically has this after chemotherapy.  Serum creatinine climbing.  This was felt due to chemotherapy 10/5 through 10/7: Continues to improve, oxygen weaned down to 6 L nasal cannula  Consults: date of consult/date signed off & final recs:  Pulmonary consulted 9/25  Procedures (surgical and bedside):    Significant Diagnostic Tests:  Echocardiogram from 8/31: Ejection fraction 55 to 60% wall Motion was normal diastolic function normal wall  CT angiogram 9/25: Negative for pulmonary edema.  Increased mediastinal adenopathy, hilar adenopathy, and central groundglass opacities.  Also has left pleural effusion.  This looks small to moderate in size.  Pulmonary masses and right axillary adenopathy are stable.  Increased consolidation left lower lobe.  Micro Data: Respiratory virus panel 9/24: Negative Blood cultures obtained 9/23:>>> Urine culture 9/22: Negative  Antimicrobials:  Aztreonam 9/22 through 9/23 Cefepime 9/23>>>9/30 Metronidazole 9/22 through 9/23 Vancomycin 9/22>>>9/30 Diflucan X 5 days, oral thrush   Subjective:  Doing well overnight. No issues. Greatly improved. Down to [facility] this morning!  Objective   Blood pressure 131/85, pulse 60, temperature 98.1 F (36.7 C), temperature source Oral, resp. rate (Abnormal) 21, height 5\' 8"  (1.727 m), weight 78.3 kg, SpO2 92 %.    FiO2 (%):  [  6 %] 6 %   Intake/Output Summary (Last 24 hours) at 10/28/2017 0925 Last data filed at 10/28/2017 0730 Gross per 24 hour  Intake 692.25 ml  Output 2025 ml  Net -1332.75  ml   Filed Weights   10/13/17 1907 10/13/17 2121  Weight: 79.4 kg 78.3 kg    Examination: General: 57 year old white male currently resting in bed he is in no acute distress HEENT normocephalic atraumatic no jugular venous distention mucous membranes are moist there is no thrush. Pulmonary: Decreased left base, clear on the right, no accessory use Cardiac: Regular rate and rhythm without murmur rub or gallop Abdomen: Soft nontender no organomegaly positive bowel sounds Extremities: Generalized edema both upper and lower extremities warm dry otherwise, strong pulses brisk capillary refill. Neuro: Awake oriented no deficits GU: Voids spontaneously  Resolved Hospital Problem list     Assessment & Plan:   Acute on chronic hypoxic respiratory failure in the setting of diffuse pulmonary nodular infiltrates due to lymphoma involvement of the lung - unclear etiology of these nodular infiltrates, we are assuming this is related to his lymphoma - greatly improved, has been off from BiPAP for over 48 hours now down to 6 L nasal cannula Plan Mobilize Watch fever curve Repeat Lasix Incentive spirometry Wean oxygen for saturations greater than 88% Repeat chest x-ray in a.m. No change in decadron  Pancytopenia. W/ febrile neutropenia - ANA and WBC increased after Neupogen  -Platelet count continues to fall status post chemotherapy -Received 1 unit blood on 10/6 CBC improved Plan Continue to trend CBC Continue G-CSF with daily differentials Holding Lovenox  Steroid related hyperglycemia Plan Sliding scale insulin  T-cell lymphoma with metastasis to lung Plan Chemo per oncology   History of superficial DVT Had been on Xarelto since 2018 Plan Holding low molecular weight heparin due to thrombocytopenia  Acute renal failure, problem pre-renal component, diuresis Elevated BUN, related to steroids all improving as of 10/7 Plan Continue to monitor with active diuresis  Critical  Illness Nutritional Support Debility  Plan Continue regular diet and multivitamin  Chemotherapy related nausea Plan PRN Zofran  Disposition / Summary of Today's Plan 10/28/17   Nasal cannula O2 today.  Up with assist.    Diet: regular  Pain/Anxiety/Delirium protocol (if indicated): Not indicated VAP protocol (if indicated): Not indicated DVT prophylaxis: [place] LMWH GI prophylaxis: Not indicated Hyperglycemia protocol: Not indicated Mobility: Bedrest Code Status: Full code Family Communication: Family updated  Labs   CBC: Recent Labs  Lab 10/22/17 0415 10/25/17 0405 10/27/17 0840 10/28/17 0230  WBC 10.3 30.7* 17.2* 11.7*  NEUTROABS 9.5 29.2* 17.0* 11.6*  HGB 7.8* 7.2* 6.9* 10.1*  HCT 23.3* 20.6* 19.9* 28.8*  MCV 94.7 92.4 93.0 91.4  PLT 74* 57* 40* 29*    Basic Metabolic Panel: Recent Labs  Lab 10/24/17 1034  10/25/17 0946 10/25/17 1718 10/26/17 0355 10/26/17 1700 10/28/17 0220  NA 140   < > 141 139 142 138 141  K 4.6   < > 4.5 4.7 4.9 4.3 4.1  CL 101   < > 104 101 105 100 100  CO2 25   < > 25 24 26 27 29   GLUCOSE 213*   < > 252* 307* 176* 272* 153*  BUN 78*   < > 84* 83* 81* 72* 53*  CREATININE 1.49*   < > 1.57* 1.60* 1.45* 1.18 1.06  CALCIUM 8.3*   < > 8.5* 8.7* 8.7* 8.6* 8.7*  MG 2.4  --   --   --   --   --  1.9  PHOS 6.0*  --   --   --   --   --   --    < > = values in this interval not displayed.   GFR: Estimated Creatinine Clearance: 74.4 mL/min (by C-G formula based on SCr of 1.06 mg/dL). Recent Labs  Lab 10/22/17 0415 10/25/17 0405 10/27/17 0840 10/28/17 0230  WBC 10.3 30.7* 17.2* 11.7*    Liver Function Tests: Recent Labs  Lab 10/22/17 0415 10/24/17 1034 10/25/17 0405 10/28/17 0220  AST 26 29 26 28   ALT 51* 41 36 30  ALKPHOS 44 41 47 57  BILITOT 0.6 0.4 0.6 0.7  PROT 4.8* 5.1* 4.8* 5.5*  ALBUMIN 2.0* 2.1* 2.1* 2.4*   No results for input(s): LIPASE, AMYLASE in the last 168 hours. No results for input(s): AMMONIA in the last  168 hours.  ABG    Component Value Date/Time   PHART 7.418 10/16/2017 0802   PCO2ART 34.8 10/16/2017 0802   PO2ART 36.3 (LL) 10/16/2017 0802   HCO3 22.0 10/16/2017 0802   ACIDBASEDEF 1.5 10/16/2017 0802   O2SAT 62.9 10/16/2017 0802     Coagulation Profile: No results for input(s): INR, PROTIME in the last 168 hours.  Cardiac Enzymes: No results for input(s): CKTOTAL, CKMB, CKMBINDEX, TROPONINI in the last 168 hours.  HbA1C: No results found for: HGBA1C  CBG: Recent Labs  Lab 10/27/17 1540 10/27/17 1935 10/27/17 2322 10/28/17 0345 10/28/17 0735  GLUCAP 216* 177* 147* 151* 138*   ___________________________________________________________________________  Erick Colace ACNP-BC [place] Pager # [phone] OR # [phone] if no answer

## 2017-10-28 NOTE — Progress Notes (Signed)
IP PROGRESS NOTE  Subjective:   Mr. Yepes has transition to nasal cannula oxygen support.  He has been up in the chair, but is not ambulating.  He has noted dyspepsia.  Objective: Vital signs in last 24 hours: Blood pressure 131/85, pulse 60, temperature 98.6 F (37 C), temperature source Oral, resp. rate (!) 21, height '5\' 8"'$  (1.727 m), weight 172 lb 9.6 oz (78.3 kg), SpO2 98 %.  Intake/Output from previous day: 10/06 0701 - 10/07 0700 In: 692.3 [I.V.:29; Blood:663.3] Out: 1825 [Urine:1825]  Physical Exam: HEENT: No thrush Lymph nodes: 1 cm right axillary node Cardiac: Regular rate and rhythm Lungs: Good air movement bilaterally but no respiratory distress, inspiratory rales at the lower posterior chest bilaterally Abdomen: No hepatosplenomegaly, nontender Extremities: Trace edema at the hands and feet  Portacath/PICC-without erythema  Lab Results: Recent Labs    10/27/17 0840 10/28/17 0230  WBC 17.2* 11.7*  HGB 6.9* 10.1*  HCT 19.9* 28.8*  PLT 40* 29*    BMET Recent Labs    10/26/17 1700 10/28/17 0220  NA 138 141  K 4.3 4.1  CL 100 100  CO2 27 29  GLUCOSE 272* 153*  BUN 72* 53*  CREATININE 1.18 1.06  CALCIUM 8.6* 8.7*    Studies/Results: No results found.  Medications: I have reviewed the patient's current medications.  Assessment/Plan:  1. 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  PET scan 03/20/2016 with extensive bulky hypermetabolic nodal activity in the neck, chest, abdomen and pelvis; mild splenomegaly with diffusely mildly accentuated splenic  activity  Staging bone marrow biopsy 03/23/2016-negative for involvement with lymphoma  Cycle 1 EPOCH beginning 03/23/2016  Cycle 2 [facility] 04/13/2016  Restaging PET scan at [facility] 05/08/2016-lymph nodes with various degrees of FDG activity in the neck, axilla, right written him, mesentery, pelvis, and groin. Indeterminate liver lesions without FDG activity, nodular mass in the posterior medial aspect of the left thigh  Cycle 3 EPOCH04/27/2018  Cycle 1 CVP 06/21/2016  Cytoxan/prednisone plus brentuximab 07/17/2016  Cytoxan/prednisone plus brentuximab 08/07/2016 (dose reduced)  Initiation of every three-week brentuximab08/08/2016  brentuximabdose reduced 10/31/2016 secondary to neuropathy  Brentuximab further dose reduced 01/03/2017 secondary to neuropathy  Presentation with parietal scalp nodular lesion 03/08/2017  Staging PET scan 7/67/3419-[id number] hypermetabolic lymphadenopathy in the neck, chest, abdomen/pelvis, and hypermetabolic cutaneous lesions  03/22/2017 CT biopsy left retroperitoneal nodal mass-recurrent CD30 positive T-cell lymphoma  Cycle 1 bendamustine/CPI-613on clinical trial at [facility] 04/08/2017  CTs 05/22/2017 After 2 cycles-mixed response: Enlargement of left supraclavicular node, right axillary node, right pelvic sidewall mass, decreased retroperitoneal abdominal adenopathy  Cycle 3 bendamustine/CPI-613 06/03/2017  Cycle 4 bendamustine/CPI-613 07/09/2017  CT 08/05/2017- increase in the size of a level to a lymph node, decreased size of level 3 and supraclavicular nodes, enlargement of right axillary nodes, new bilateral pulmonary nodules, decreased mediastinal, rectal peritoneal, and iliac nodes  Cycle 5 bendamustine/CPI-6137/16/2019  Cycle 6 bendamustine/CPI-613 09/02/2017  Cycle 1 cisplatin/gemcitabine 09/30/2017 by day 8 gemcitabine held secondary to neutropenia/thrombocytopenia  Cycle 1 DHAP 10/21/2017 2. 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 He entered clinical remission following chemotherapy and  remained in remission when he was seen at the cancer center 02/24/2009. 3. 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 Staging PET scan 08/23/2010 with increased FDG activity at the left tonsillar fossa and no additional evidence of lymphoma. He completed 4 cycles of R-ICE with cycle #1 beginning on 09/19/2010 and cycle #4 on 11/21/2010. Repeat head and neck examination by Dr. Constance Holster following R-ICE/rituximab showed no residual lymphoma. He began radiation consolidation on 12/18/2010, radiation was completed on 01/22/2011. 4. History of neutropenia secondary to rituximab. 5. Anemia secondary to chemotherapy and phlebotomy 6. Thrombocytopenia secondary to chemotherapy 7. Hypertension. 8. 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 05/08/2017-nodular mass in the left thigh adjacent to the femur  MRI 06/07/2016-infiltrative mass in the left thigh adductor muscle, separate from the schwannoma excision site  MRI 10/08/2016-marked improvement in the left adductormusclemass with a small area of enhancement remaining, no discrete mass 9. Port-A-Cath placement 03/21/2016 10. Superficial thrombus left greater saphenous vein 04/13/2016. Lovenox initiated, converted to Xarelto beginning 04/18/2016 11. 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 Admission 09/20/2017 with cough/fever/dyspnea-CT concerning for progression of lymphoma in the lungs versus pneumoniatreated with vancomycin/cefepime, started Levaquin 09/25/2017 13.  Admission 10/13/2017 with fever in the  setting of severe neutropenia, no source for infection identified other than possible pneumonia 14.   Respiratory failure- most likely secondary to progression of non-Hodgkin's lymphoma in the lungs, improved       Mr. Conran has improved significantly over the past week.  He is now maintained on nasal cannula oxygen support.  He plans to begin ambulation today.  The platelets and white cells are following following the DHAP.  He was transfused packed red blood cells yesterday.   Recommendations: 1.  New Decadron at the current dose 2.  Continue G-CSF daily CBC/differential 3.  Oxygen is as tolerated 4.  Physical therapy, out of bed 5.  Hold Lovenox    LOS: 15 days   [name], MD   10/28/2017, 7:23 AM

## 2017-10-28 NOTE — Progress Notes (Signed)
[place] Progress Note Patient Name: [name] DOB: December 02, 1960 MRN: [id number]   Date of Service  10/28/2017  HPI/Events of Note  Thrombocytopenia - Platelet count = 29K. Recent Chemo Rx. No active bleeding.   eICU Interventions  Continue to watch Platelet count.     Intervention Category Intermediate Interventions: Thrombocytopenia - evaluation and management  [name],[name] 10/28/2017, 3:23 AM

## 2017-10-28 NOTE — Care Management Note (Signed)
Case Management Note  Patient Details  Name: [name] MRN: [id number] Date of Birth: [birth year]  Subjective/Objective:                  1.  New Decadron at the current dose 2.  Continue G-CSF daily CBC/differential 3.  Oxygen is as tolerated 4.  Physical therapy, out of bed 5.  Hold Lovenox  Action/Plan: Will follow for progression of care and clinical status. Will follow for case management needs none present at this time.  Expected Discharge Date:  10/15/17               Expected Discharge Plan:  Home/Self Care  In-House Referral:     Discharge planning Services  CM Consult  Post Acute Care Choice:    Choice offered to:     DME Arranged:    DME Agency:     HH Arranged:    HH Agency:     Status of Service:  In process, will continue to follow  If discussed at Long Length of Stay Meetings, dates discussed:    Additional Comments:  Leeroy Cha, RN 10/28/2017, 8:42 AM

## 2017-10-28 NOTE — Progress Notes (Signed)
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 Admitted on 9/22 with neutropenic fever, started on broad-spectrum antibiotics. Critical care consulted 9/25 for acute on chronic hypoxic respiratory failure. Pt with nonhodgkins lymphoma dx in 2018. PMh includes essential tremor, diffuse large B cell lymphoma diagnosed 2005.     PT Comments    Pt ambulated 250 ft with RW, min guard assist, and 6 standing rest breaks for RR (15-32) and O2sats (>= 90% on 5LO2 via [place]). PT at times encouraged pt to take standing rest breaks for high RR or increased work of breathing, but pt tends to be a good self-regulator of rest breaks for dyspnea or low sats. Pt more limited this session by RR versus O2sats. PT reviewed HEP with pt to improve LE strength, as pt reported feeling LE weakness during ambulation. PT to continue to follow and progress mobility as able.     Follow Up Recommendations  Supervision for mobility/OOB;Home health PT     Equipment Recommendations  Rolling walker with 5" wheels    Recommendations for Other Services       Precautions / Restrictions Precautions Precautions: Fall Precaution Comments: on 5L O2 via , watch sats. Neutropenic (Pt and PT wore mask for pt care/ambulation in hall)  Restrictions Weight Bearing Restrictions: No    Mobility  Bed Mobility Overal bed mobility: Needs Assistance             General bed mobility comments: Pt up in chair upon arrival to room, PT left room with pt up in chair with RN present.   Transfers Overall transfer level: Needs assistance Equipment used: Rolling walker (2 wheeled) Transfers: Sit to/from Stand Sit to Stand: Min guard;+2 safety/equipment         General transfer comment: min guard for  safety, pt with less unsteadiness and steadying requirements in standing this session. Pt motivated to ambulate in hall today, PT and pt with mask use.   Ambulation/Gait Ambulation/Gait assistance: Min guard;+2 safety/equipment(chair follow if rests needed ) Gait Distance (Feet): 250 Feet Assistive device: Rolling walker (2 wheeled) Gait Pattern/deviations: Step-through pattern;Decreased stride length;Trunk flexed Gait velocity: decreased, multiple standing rest breaks    General Gait Details: Pt on 5LO2 via  during ambulation. Pt with 6 standing rest breaks during ambulation for dyspnea, RR up to 32 at one point. Pt with sats maintained at or above 90% for duration of ambulation. HR 86-94 during mobility. Pt reporting feeling wobbly at midpoint of ambulation and beyond, but highly motivated to continue ambulation. PT did not observe LE quivering/buckling.    Stairs             Wheelchair Mobility    Modified Rankin (Stroke Patients Only)       Balance Overall balance assessment: Needs assistance Sitting-balance support: Feet supported Sitting balance-Leahy Scale: Fair Sitting balance - Comments: sits edge of chair without UE support      Standing balance-Leahy Scale: Poor Standing balance comment: relies on RW for UE support                             Cognition Arousal/Alertness: Awake/alert Behavior During Therapy: WFL for tasks assessed/performed Overall Cognitive Status: Within Functional Limits for tasks assessed  General Comments: highly motivated       Exercises Other Exercises Other Exercises: Verbally reviewed ankle pumps, quad sets, LAQs, sit to stands with physical assist, and calf stretch for pt to do as HEP until next PT session.     General Comments        Pertinent Vitals/Pain Pain Assessment: No/denies pain    Home Living                      Prior Function             PT Goals (current goals can now be found in the care plan section) Acute Rehab PT Goals Patient Stated Goal: improve mobility  PT Goal Formulation: With patient/family Time For Goal Achievement: 11/07/17 Potential to Achieve Goals: Fair Progress towards PT goals: Progressing toward goals    Frequency    Min 3X/week      PT Plan Current plan remains appropriate    Co-evaluation              AM-PAC PT "6 Clicks" Daily Activity  Outcome Measure  Difficulty turning over in bed (including adjusting bedclothes, sheets and blankets)?: Unable Difficulty moving from lying on back to sitting on the side of the bed? : Unable Difficulty sitting down on and standing up from a chair with arms (e.g., wheelchair, bedside commode, etc,.)?: Unable Help needed moving to and from a bed to chair (including a wheelchair)?: None Help needed walking in hospital room?: A Little Help needed climbing 3-5 steps with a railing? : A Lot 6 Click Score: 12    End of Session Equipment Utilized During Treatment: Gait belt Activity Tolerance: Patient limited by fatigue;Other (comment);Patient tolerated treatment well(Rest breaks for O2sats and RR) Patient left: with call bell/phone within reach;in chair;with nursing/sitter in room Nurse Communication: Mobility status PT Visit Diagnosis: Unsteadiness on feet (R26.81);Difficulty in walking, not elsewhere classified (R26.2)     Time: 4462-8638 PT Time Calculation (min) (ACUTE ONLY): 18 min  Charges:  $Gait Training: 8-22 mins                     [name], PT Acute Rehabilitation Services Pager [phone]  Office [phone]    [name] 10/28/2017, 12:41 PM

## 2017-10-28 NOTE — Progress Notes (Signed)
Nutrition Follow-up  DOCUMENTATION CODES:   Not applicable  INTERVENTION:  - Will d/c Ensure, per patient preference. - Will order Boost Breeze TID, each supplement provides 250 kcal and 9 grams of protein. - Continue to encourage PO intakes. - Weigh patient today.   NUTRITION DIAGNOSIS:   Moderate Malnutrition related to chronic illness, cancer and cancer related treatments as evidenced by mild fat depletion, mild muscle depletion, percent weight loss. -revised.   GOAL:   Patient will meet greater than or equal to 90% of their needs -unmet  MONITOR:   PO intake, Supplement acceptance, Weight trends, Labs  ASSESSMENT:   Pt w/ T-cell lymphoma, lung mets admitted 9/22 w/ neutropenic fever and hypoxic respiratory failure. Pt tolerating on/off BiPap trials  w/ HHFNC. Last chemo treatment 2 weeks ago -gemcitabine and cisplatin. Granix started 9/23 has been discontinued.   No new weight since admission on 9/22. Diet advanced from FLD to Regular on 10/5 at 3:30 PM. No intakes documented since that time. Patient states that he likely overdid it yesterday and that he was not feeling well subsequently. This AM he ate applesauce, peaches, and pears.   He likes [employer] and would like to have that supplement available throughout the day today; he does not care for creamy supplements at this time. He plans to try soup next and to to slowly add in items and work up to more solid foods. Encouraged him to listen to his body and to make sure to consume supplements in order to get adequate protein. Patient plans to walk with PT today. He states hope to be d/c'ed by the end of the week.   Per Pete's note this AM: has been completely off BiPAP x48 hours and on 6L [place], repeat CXR tomorrow AM, steroid-related hyperglycemia, T-cell lymphoma with lung mets.    Medications reviewed; 20 mg IV Lasix x1 dose today, sliding scale Novolog, 1 tablet Prosight/day, 40 mg oral Protonix/day, 100 mg thiamine/day.   Labs reviewed; CBGs: 151 and 138 mg/dL today, BUN: 53 mg/dL, Ca: 8.7 mg/dL.     Diet Order:   Diet Order            Diet regular Room service appropriate? Yes; Fluid consistency: Thin  Diet effective now              EDUCATION NEEDS:   Not appropriate for education at this time  Skin:  Skin Assessment: Reviewed RN Assessment  Last BM:  10/5  Height:   Ht Readings from Last 1 Encounters:  10/13/17 5\' 8"  (1.727 m)    Weight:   Wt Readings from Last 1 Encounters:  10/13/17 78.3 kg    Ideal Body Weight:  70 kg  BMI:  Body mass index is 26.24 kg/m.  Estimated Nutritional Needs:   Kcal:  5366-4403 (25-30kcal/kg)  Protein:  95-118 (1.2-1.5g/kg)  Fluid:  2.3L     Jarome Matin, MS, RD, LDN, CNSC Inpatient Clinical Dietitian Pager # [phone] After hours/weekend pager # [phone]

## 2017-10-28 NOTE — Progress Notes (Addendum)
Pt has had occasional short epitsodes of bradycardia with hr 48 bpm.  Not symptomatic.

## 2017-10-28 NOTE — Progress Notes (Signed)
No post transfusion or morning labs ordered.  Dr. Corinna Lines notified at [facility].

## 2017-10-28 NOTE — Progress Notes (Signed)
Critical lab value:  Platelets 29,000.  Dr.  Oletta Darter notified.  No further orders at this time

## 2017-10-29 ENCOUNTER — Ambulatory Visit: Payer: Self-pay

## 2017-10-29 ENCOUNTER — Other Ambulatory Visit: Payer: Self-pay | Admitting: Nurse Practitioner

## 2017-10-29 ENCOUNTER — Inpatient Hospital Stay (HOSPITAL_COMMUNITY): Payer: 59

## 2017-10-29 DIAGNOSIS — C8590 Non-Hodgkin lymphoma, unspecified, unspecified site: Secondary | ICD-10-CM

## 2017-10-29 LAB — CBC WITH DIFFERENTIAL/PLATELET
BASOS ABS: 0 10*3/uL (ref 0.0–0.1)
Basophils Relative: 0 %
Eosinophils Absolute: 0 10*3/uL (ref 0.0–0.5)
Eosinophils Relative: 0 %
HCT: 28.6 % — ABNORMAL LOW (ref 39.0–52.0)
Hemoglobin: 9.8 g/dL — ABNORMAL LOW (ref 13.0–17.0)
LYMPHS PCT: 1 %
Lymphs Abs: 0 10*3/uL — ABNORMAL LOW (ref 0.7–4.0)
MCH: 31.5 pg (ref 26.0–34.0)
MCHC: 34.3 g/dL (ref 30.0–36.0)
MCV: 92 fL (ref 80.0–100.0)
MONO ABS: 0.1 10*3/uL (ref 0.1–1.0)
MONOS PCT: 2 %
Neutro Abs: 3.1 10*3/uL (ref 1.7–7.7)
Neutrophils Relative %: 97 %
Platelets: 15 10*3/uL — CL (ref 150–400)
RBC: 3.11 MIL/uL — ABNORMAL LOW (ref 4.22–5.81)
RDW: 14.6 % (ref 11.5–15.5)
WBC: 3.2 10*3/uL — ABNORMAL LOW (ref 4.0–10.5)

## 2017-10-29 LAB — COMPREHENSIVE METABOLIC PANEL
ALBUMIN: 2.4 g/dL — AB (ref 3.5–5.0)
ALT: 30 U/L (ref 0–44)
AST: 27 U/L (ref 15–41)
Alkaline Phosphatase: 52 U/L (ref 38–126)
Anion gap: 11 (ref 5–15)
BUN: 45 mg/dL — AB (ref 6–20)
CALCIUM: 8.7 mg/dL — AB (ref 8.9–10.3)
CO2: 31 mmol/L (ref 22–32)
CREATININE: 0.92 mg/dL (ref 0.61–1.24)
Chloride: 100 mmol/L (ref 98–111)
GFR calc non Af Amer: >60 mL/min (ref >60)
GLUCOSE: 164 mg/dL — AB (ref 70–99)
Potassium: 4 mmol/L (ref 3.5–5.1)
SODIUM: 142 mmol/L (ref 135–145)
Total Bilirubin: 0.9 mg/dL (ref 0.3–1.2)
Total Protein: 5.3 g/dL — ABNORMAL LOW (ref 6.5–8.1)

## 2017-10-29 LAB — GLUCOSE, CAPILLARY: Glucose-Capillary: 166 mg/dL — ABNORMAL HIGH (ref 70–99)

## 2017-10-29 MED ORDER — POTASSIUM CHLORIDE CRYS ER 20 MEQ PO TBCR
20.0000 meq | EXTENDED_RELEASE_TABLET | Freq: Once | ORAL | Status: AC
  Administered 2017-10-29: 20 meq via ORAL
  Filled 2017-10-29: qty 1

## 2017-10-29 MED ORDER — FUROSEMIDE 10 MG/ML IJ SOLN
20.0000 mg | Freq: Once | INTRAMUSCULAR | Status: AC
  Administered 2017-10-29: 20 mg via INTRAVENOUS
  Filled 2017-10-29: qty 2

## 2017-10-29 NOTE — Progress Notes (Signed)
IP PROGRESS NOTE  Subjective:   Mr. Blanck appears unchanged.  He was able to ambulate yesterday.  Remains on nasal cannula oxygen.  No bleeding.  He has dysphasia. Objective: Vital signs in last 24 hours: Blood pressure (!) 155/77, pulse 64, temperature 98.1 F (36.7 C), temperature source Oral, resp. rate 18, height '5\' 8"'$  (1.727 m), weight 148 lb 2.4 oz (67.2 kg), SpO2 97 %.  Intake/Output from previous day: 10/07 0701 - 10/08 0700 In: 240 [P.O.:240] Out: 2270 [Urine:2270]  Physical Exam: HEENT: No thrush or bleeding Cardiac: Regular rate and rhythm Lungs: Good air movement bilaterally, end inspiratory wheeze at the posterior chest bilaterally, no respiratory distress Abdomen: No hepatosplenomegaly, nontender Extremities: Trace edema at the hands and feet  Portacath/PICC-without erythema  Lab Results: Recent Labs    10/28/17 0230 10/29/17 0445  WBC 11.7* 3.2*  HGB 10.1* 9.8*  HCT 28.8* 28.6*  PLT 29* 15*    BMET Recent Labs    10/28/17 0220 10/29/17 0445  NA 141 142  K 4.1 4.0  CL 100 100  CO2 29 31  GLUCOSE 153* 164*  BUN 53* 45*  CREATININE 1.06 0.92  CALCIUM 8.7* 8.7*    Studies/Results: No results found.  Medications: I have reviewed the patient's current medications.  Assessment/Plan:  1. 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  PET scan 03/20/2016 with extensive bulky hypermetabolic nodal activity in the neck, chest, abdomen and pelvis; mild splenomegaly with diffusely mildly accentuated splenic activity  Staging bone marrow biopsy 03/23/2016-negative for  involvement with lymphoma  Cycle 1 EPOCH beginning 03/23/2016  Cycle 2 [facility] 04/13/2016  Restaging PET scan at [facility] 05/08/2016-lymph nodes with various degrees of FDG activity in the neck, axilla, right written him, mesentery, pelvis, and groin. Indeterminate liver lesions without FDG activity, nodular mass in the posterior medial aspect of the left thigh  Cycle 3 EPOCH04/27/2018  Cycle 1 CVP 06/21/2016  Cytoxan/prednisone plus brentuximab 07/17/2016  Cytoxan/prednisone plus brentuximab 08/07/2016 (dose reduced)  Initiation of every three-week brentuximab08/08/2016  brentuximabdose reduced 10/31/2016 secondary to neuropathy  Brentuximab further dose reduced 01/03/2017 secondary to neuropathy  Presentation with parietal scalp nodular lesion 03/08/2017  Staging PET scan 7/89/3810-[facility] hypermetabolic lymphadenopathy in the neck, chest, abdomen/pelvis, and hypermetabolic cutaneous lesions  03/22/2017 CT biopsy left retroperitoneal nodal mass-recurrent CD30 positive T-cell lymphoma  Cycle 1 bendamustine/CPI-613on clinical trial at [facility] 04/08/2017  CTs 05/22/2017 After 2 cycles-mixed response: Enlargement of left supraclavicular node, right axillary node, right pelvic sidewall mass, decreased retroperitoneal abdominal adenopathy  Cycle 3 bendamustine/CPI-613 06/03/2017  Cycle 4 bendamustine/CPI-613 07/09/2017  CT 08/05/2017- increase in the size of a level to a lymph node, decreased size of level 3 and supraclavicular nodes, enlargement of right axillary nodes, new bilateral pulmonary nodules, decreased mediastinal, rectal peritoneal, and iliac nodes  Cycle 5 bendamustine/CPI-6137/16/2019  Cycle 6 bendamustine/CPI-613 09/02/2017  Cycle 1 cisplatin/gemcitabine 09/30/2017 by day 8 gemcitabine held secondary to neutropenia/thrombocytopenia  Cycle 1 DHAP 10/21/2017 2. 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 He entered clinical remission following chemotherapy and remained in remission when he was seen at the cancer  center 02/24/2009. 3. 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 Staging PET scan 08/23/2010 with increased FDG activity at the left tonsillar fossa and no additional evidence of lymphoma. He completed 4 cycles of R-ICE with cycle #1 beginning on 09/19/2010 and cycle #4 on 11/21/2010. Repeat head and neck examination by Dr. Constance Holster following R-ICE/rituximab showed no residual lymphoma. He began radiation consolidation on 12/18/2010, radiation was completed on 01/22/2011. 4. History of neutropenia secondary to rituximab. 5. Anemia secondary to chemotherapy and phlebotomy 6. Thrombocytopenia secondary to chemotherapy 7. Hypertension. 8. 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 05/08/2017-nodular mass in the left thigh adjacent to the femur  MRI 06/07/2016-infiltrative mass in the left thigh adductor muscle, separate from the schwannoma excision site  MRI 10/08/2016-marked improvement in the left adductormusclemass with a small area of enhancement remaining, no discrete mass 9. Port-A-Cath placement 03/21/2016 10. Superficial thrombus left greater saphenous vein 04/13/2016. Lovenox initiated, converted to Xarelto beginning 04/18/2016 11. 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 Admission 09/20/2017 with cough/fever/dyspnea-CT concerning for progression of lymphoma in the lungs versus pneumoniatreated with vancomycin/cefepime, started Levaquin 09/25/2017 13.  Admission 10/13/2017 with fever in the setting of severe neutropenia, no source for infection  identified other than possible pneumonia 14.   Respiratory failure- most likely secondary to progression of non-Hodgkin's lymphoma in the lungs, improved       [name] appears stable.  He is now at day 9 following DHAP chemotherapy.  The white count and platelets are falling as expected.  No indication for transfusion at present.  He will continue G-CSF. He continues to wean oxygen therapy as tolerated.  Recommendations: 1.  Continue Decadron 2.  Continue G-CSF daily CBC/differential 3.  Transfuse platelets for a count of less than 10,000 or bleeding 4.  I will begin arranging outpatient follow-up    LOS: 16 days   [name], MD   10/29/2017, 7:31 AM

## 2017-10-29 NOTE — Progress Notes (Signed)
Physical Therapy Treatment Patient Details Name: [name] MRN: [id number] DOB: 08/19/1960 Today's Date: 10/29/2017    History of Present Illness 57 year old male patient with T-cell lymphoma with metastasis to the lung currently undergoing chemotherapy last completed on 9/9. Admitted on 9/22 with neutropenic fever, started on broad-spectrum antibiotics. Critical care consulted 9/25 for acute on chronic hypoxic respiratory failure. Pt with nonhodgkins lymphoma dx in 2018. PMh includes essential tremor, diffuse large B cell lymphoma diagnosed 2005.     PT Comments    Pt with improved ambulation distance, requiring standing rest breaks every 30-40 ft ambulation. Pt with sats ranging from 83-100% during ambulation, pt recovering quickly from 83% and this may have been an incorrect reading on portable pulse ox. When dynmap was used, sats between 89-100%. After ambulation and with pt in bed, sats at 89% on 2LO2. Pt bumped up to 3LO2, sats reading at 94%. RN notified.   Pt informed that 30-40 ft ambulation distance typically corresponds with need for a standing rest break to recover RR and sats. Pt instructed to have awareness of dyspnea with ambulation, and take rests as needed. Pt also instructed to avoid anything but light ADLs, given low platelet levels at this time. PT okayed pt to perform ankle pumps and quad sets as they are light and easy for pt to perform. Pt informed PT that he will be getting bed on first floor of his home so he does not have to use stairs. PT to continue to follow acutely and progress mobility as able.    Follow Up Recommendations  Supervision for mobility/OOB;Home health PT     Equipment Recommendations  Rolling walker with 5" wheels    Recommendations for Other Services       Precautions / Restrictions Precautions Precautions: Fall Precaution Comments: on 2LO2 via [place]. Neutropenic (Pt and PT wore mask for pt care/ambulation in hall). Low  platelets. Restrictions Weight Bearing Restrictions: No    Mobility  Bed Mobility Overal bed mobility: Needs Assistance             General bed mobility comments: Pt up in chair upon arrival to room.   Transfers Overall transfer level: Needs assistance Equipment used: Rolling walker (2 wheeled) Transfers: Sit to/from Stand Sit to Stand: Supervision         General transfer comment: Supervision for safety. Pt needed to scoot forward in recliner to stand. Increased time/effort   Ambulation/Gait Ambulation/Gait assistance: Supervision;Min guard Gait Distance (Feet): 260 Feet Assistive device: Rolling walker (2 wheeled) Gait Pattern/deviations: Step-through pattern;Decreased stride length;Narrow base of support;Trunk flexed;Drifts right/left Gait velocity: decreased, multiple standing rest breaks    General Gait Details: Pt on 2LO2 via [place] at start of ambulation, bumped to 3LO2 because pt with increased work of breathing with ambulation. Pt requiring standing rest breaks every 30-40 ft for dyspnea and increased RR. PT using portable pulse oximeter intermittently, reading 83% on 3LO2. Bumped to 4LO2, rechecked with dynmap 1 minute later, sats reading 98%. Pt bumped back down to 2L O2 and monitored with dynmap for rest of session. For last 80 ft ambulation, sats ranged from 90-100% on 2LO2.    Stairs             Wheelchair Mobility    Modified Rankin (Stroke Patients Only)       Balance Overall balance assessment: Needs assistance Sitting-balance support: Feet supported Sitting balance-Leahy Scale: Fair Sitting balance - Comments: sits edge of chair without UE support  Standing balance-Leahy Scale: Poor Standing balance comment: relies on RW for UE support                             Cognition Arousal/Alertness: Awake/alert Behavior During Therapy: [facility] for tasks assessed/performed;Anxious Overall Cognitive Status: Within Functional Limits for  tasks assessed                                        Exercises      General Comments        Pertinent Vitals/Pain Pain Assessment: Faces Faces Pain Scale: Hurts little more Pain Location: back  Pain Descriptors / Indicators: Aching Pain Intervention(s): Limited activity within patient's tolerance;Monitored during session    Home Living                      Prior Function            PT Goals (current goals can now be found in the care plan section) Acute Rehab PT Goals Patient Stated Goal: improve mobility  PT Goal Formulation: With patient/family Time For Goal Achievement: 11/07/17 Potential to Achieve Goals: Fair Progress towards PT goals: Progressing toward goals    Frequency    Min 3X/week      PT Plan Current plan remains appropriate    Co-evaluation              AM-PAC PT "6 Clicks" Daily Activity  Outcome Measure  Difficulty turning over in bed (including adjusting bedclothes, sheets and blankets)?: Unable Difficulty moving from lying on back to sitting on the side of the bed? : Unable Difficulty sitting down on and standing up from a chair with arms (e.g., wheelchair, bedside commode, etc,.)?: Unable Help needed moving to and from a bed to chair (including a wheelchair)?: None Help needed walking in hospital room?: A Little Help needed climbing 3-5 steps with a railing? : A Lot 6 Click Score: 12    End of Session Equipment Utilized During Treatment: Gait belt Activity Tolerance: Patient limited by fatigue;Other (comment);Patient tolerated treatment well(Rest breaks for O2sats and RR) Patient left: with call bell/phone within reach;in bed Nurse Communication: Mobility status PT Visit Diagnosis: Unsteadiness on feet (R26.81);Difficulty in walking, not elsewhere classified (R26.2)     Time: 3710-6269 PT Time Calculation (min) (ACUTE ONLY): 28 min  Charges:  $Gait Training: 23-37 mins                     [name], PT Acute Rehabilitation Services Pager [phone]  Office [phone]   Roxine Caddy D Elonda Husky 10/29/2017, 12:33 PM

## 2017-10-29 NOTE — Progress Notes (Signed)
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  Admitted on 9/22 with neutropenic fever, started on broad-spectrum antibiotics. Critical care consulted 9/25 for acute on chronic hypoxic respiratory failure. Overall, has improved with initiation of chemo. On and off Bipap, O2 requirements have improved.   Significant Hospital Events    9/23: Infectious disease consulted.  Had developed mild worsening and chronic cough and shortness of breath recommended to continue vancomycin and cefepime possible pneumonia.  Was given Granix per hematology with plans to continue this until [name] was over 1000.  Chest x-ray with diffuse bilateral pulmonary infiltrates chronic in nature since late Aug 2019, 9/24: Having intermittent fever, cough a little bit worse.  One episode of nausea vomiting after forceful cough.  Room air oxygenation at 77% 9/25: Increased tachypnea.  Fluid challenge administered earlier.  Given IV Lasix.  Chest x-ray obtained showed progressive left lower lobe airspace disease, and persistent bilateral patchy airspace disease, oxygen requirements up to 7 L, CT chest negative for pulmonary emboli.  Moved to stepdown unit, trickle care asked to see for acute on chronic respiratory failure.  9/26: feels better. Still on high FIO2 cough and fever improved. Over all looking better. Neg almost 2 liters over 24 hrs but still > 4 liters + 9/27 still febrile still w/ high FIO2 needs systemic steroids started 9/27 through 9/30: Antibiotics continued.  Still requiring high flow oxygen and BiPAP alternating.  Aspergillosis antibody sent.  Steroid dosing increased on the 28th.  On the 30th still  very hypoxic rapidly desaturating.  Case was discussed with oncology at [facility] as well as [facility], the consensus was to proceed with DHAP chemotherapy, this was toc be initiated at reduced therapy due to recent hematologic toxicity 10/1, 10/2, 10/3 - Improving slowly, off/no BIPAP PRN 10/3 - Down to 45% FiO2, chemo + diuresis  10/4 - Continues to feel better.  FiO2 at 50%, using noninvasive much less.  Having nausea, typically has this after chemotherapy.  Serum creatinine climbing.  This was felt due to chemotherapy 10/5 through 10/7: Continues to improve, oxygen weaned down to 6 L nasal cannula, IV Lasix given 10/8: -9 L since admission.  Now down to 2 L nasal cannula Consults: date of consult/date signed off & final recs:  Pulmonary consulted 9/25  Procedures (surgical and bedside):    Significant Diagnostic Tests:  Echocardiogram from 8/31: Ejection fraction 55 to 60% wall Motion was normal diastolic function normal wall  CT angiogram 9/25: Negative for pulmonary edema.  Increased mediastinal adenopathy, hilar adenopathy, and central groundglass opacities.  Also has left pleural effusion.  This looks small to moderate in size.  Pulmonary masses and right axillary adenopathy are stable.  Increased consolidation left lower lobe.  Micro Data: Respiratory virus panel 9/24: Negative Blood cultures obtained 9/23:>>> Urine culture 9/22: Negative  Antimicrobials:  Aztreonam 9/22 through 9/23 Cefepime 9/23>>>9/30 Metronidazole 9/22 through 9/23 Vancomycin 9/22>>>9/30 Diflucan X 5 days, oral thrush   Subjective:  Doing well overnight. No issues. Greatly improved. Down to [facility] this morning!  Objective   Blood pressure (Abnormal) 155/77, pulse 64, temperature 98 F (36.7 C), temperature source Oral, resp. rate 18, height 5'  8" (1.727 m), weight 67.2 kg, SpO2 97 %.        Intake/Output Summary (Last 24 hours) at 10/29/2017 0903 Last data filed at 10/29/2017 0500 Gross per 24  hour  Intake 240 ml  Output 2070 ml  Net -1830 ml   Filed Weights   10/13/17 1907 10/13/17 2121 10/28/17 1057  Weight: 79.4 kg 78.3 kg 67.2 kg    Examination: General: 57 year old white male currently sitting in bed he is in no acute distress HEENT normocephalic atraumatic mucous membranes moist no thrush. Pulmonary: Crackles right base diminished left base no accessory use Cardiac: Regular rate and rhythm without murmur rub or gallop Abdomen: Soft nontender no organomegaly Extremities: Upper extremity swelling has resolved, still has 2+ dependent edema lower extremities.  Strong pulses brisk capillary refill appreciated L: Awake oriented no focal deficits GU: Clear yellow urine  Resolved Hospital Problem list     Assessment & Plan:   Acute on chronic hypoxic respiratory failure in the setting of diffuse pulmonary nodular infiltrates due to lymphoma involvement of the lung - unclear etiology of these nodular infiltrates, we are assuming this is related to his lymphoma - greatly improved, on 2 L Potable chest x-ray personally reviewed, left greater than right airspace disease no significant change but certainly not worse Plan Continue mobilization  Repeating Lasix  Wean oxygen  Decadron per oncology  Incentive spirometry  Pulse oximetry   Pancytopenia. W/ febrile neutropenia - ANA and WBC increased after Neupogen  -Platelet count continues to fall status post chemotherapy -Received 1 unit blood on 10/6 CBC improved Plan trend CBC  Continue G/CSF with daily differentials Holding Lovenox Transfusion trigger less than 10 for platelets  Steroid related hyperglycemia Plan Sliding scale insulin  T-cell lymphoma with metastasis to lung Plan Chemo per oncology  History of superficial DVT Had been on Xarelto since 2018 Plan Holding anticoagulation due to thrombocytopenia  Acute renal failure, problem pre-renal component, diuresis Elevated BUN, related to steroids  all improving as of 10/8 Plan Continue to monitor with active diuresis  Critical Illness Nutritional Support Debility  Plan Continue regular diet and multivitamin  Chemotherapy related nausea Plan Prn zofran   Disposition / Summary of Today's Plan 10/29/17   Lasix x1 more dose Continue mobilization Continue to wean oxygen Transferred to oncology floor, hope for discharge in the next 24 to 48 hours    Diet: regular  Pain/Anxiety/Delirium protocol (if indicated): Not indicated VAP protocol (if indicated): Not indicated DVT prophylaxis: [place] LMWH, placed on hold on 10/6 due to thrombocytopenia GI prophylaxis: Not indicated Hyperglycemia protocol: Sliding scale insulin Mobility: Out of bed.  Will need home physical therapy Code Status: Full code Family Communication: Family updated  Labs   CBC: Recent Labs  Lab 10/25/17 0405 10/27/17 0840 10/28/17 0230 10/29/17 0445  WBC 30.7* 17.2* 11.7* 3.2*  NEUTROABS 29.2* 17.0* 11.6* 3.1  HGB 7.2* 6.9* 10.1* 9.8*  HCT 20.6* 19.9* 28.8* 28.6*  MCV 92.4 93.0 91.4 92.0  PLT 57* 40* 29* 15*    Basic Metabolic Panel: Recent Labs  Lab 10/24/17 1034  10/25/17 1718 10/26/17 0355 10/26/17 1700 10/28/17 0220 10/29/17 0445  NA 140   < > 139 142 138 141 142  K 4.6   < > 4.7 4.9 4.3 4.1 4.0  CL 101   < > 101 105 100 100 100  CO2 25   < > 24 26 27 29 31   GLUCOSE 213*   < > 307* 176* 272* 153* 164*  BUN 78*   < > 83* 81* 72* 53* 45*  CREATININE 1.49*   < > 1.60* 1.45* 1.18 1.06 0.92  CALCIUM 8.3*   < > 8.7* 8.7* 8.6* 8.7* 8.7*  MG 2.4  --   --   --   --  1.9  --   PHOS 6.0*  --   --   --   --   --   --    < > = values in this interval not displayed.   GFR: Estimated Creatinine Clearance: 84.2 mL/min (by C-G formula based on SCr of 0.92 mg/dL). Recent Labs  Lab 10/25/17 0405 10/27/17 0840 10/28/17 0230 10/29/17 0445  WBC 30.7* 17.2* 11.7* 3.2*    Liver Function Tests: Recent Labs  Lab 10/24/17 1034 10/25/17 0405  10/28/17 0220 10/29/17 0445  AST 29 26 28 27   ALT 41 36 30 30  ALKPHOS 41 47 57 52  BILITOT 0.4 0.6 0.7 0.9  PROT 5.1* 4.8* 5.5* 5.3*  ALBUMIN 2.1* 2.1* 2.4* 2.4*   No results for input(s): LIPASE, AMYLASE in the last 168 hours. No results for input(s): AMMONIA in the last 168 hours.  ABG    Component Value Date/Time   PHART 7.418 10/16/2017 0802   PCO2ART 34.8 10/16/2017 0802   PO2ART 36.3 (LL) 10/16/2017 0802   HCO3 22.0 10/16/2017 0802   ACIDBASEDEF 1.5 10/16/2017 0802   O2SAT 62.9 10/16/2017 0802     Coagulation Profile: No results for input(s): INR, PROTIME in the last 168 hours.  Cardiac Enzymes: No results for input(s): CKTOTAL, CKMB, CKMBINDEX, TROPONINI in the last 168 hours.  HbA1C: No results found for: HGBA1C  CBG: Recent Labs  Lab 10/28/17 0735 10/28/17 1248 10/28/17 1717 10/28/17 1937 10/29/17 0311  GLUCAP 138* 260* 237* 178* 166*   ___________________________________________________________________________  Erick Colace ACNP-BC [place] Pager # [phone] OR # [phone] if no answer

## 2017-10-29 NOTE — Progress Notes (Signed)
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 Admitted on 9/22 with neutropenic fever, started on broad-spectrum antibiotics. Critical care consulted 9/25 for acute on chronic hypoxic respiratory failure. Pt with nonhodgkins lymphoma dx in 2018. PMh includes essential tremor, diffuse large B cell lymphoma diagnosed 2005.    OT comments  Pt is making good progress.  Nursing is allowing pt to get to bathroom on his own.  Educated on energy conservation and pt verbalizes understanding.  Platelets decreased today (15):  Educated to hold theraband program for now. Will plan to provide written HEP prior to d/c home.   Follow Up Recommendations  Supervision/Assistance - 24 hour    Equipment Recommendations  None recommended by OT(pt has access to high commode; walk in shower with seat)    Recommendations for Other Services      Precautions / Restrictions Precautions Precautions: Fall Precaution Comments: on 2LO2 via Bath. Neutropenic (Pt and PT wore mask for pt care/ambulation in hall)  Restrictions Weight Bearing Restrictions: No       Mobility Bed Mobility                  Transfers                      Balance Overall balance assessment: Needs assistance Sitting-balance support: Feet supported Sitting balance-Leahy Scale: Fair Sitting balance - Comments: sits edge of chair without UE support      Standing balance-Leahy Scale: Poor Standing balance comment: relies on RW for UE support                            ADL either performed or assessed with clinical judgement   ADL                                         General ADL Comments: stopped by in am and educated pt to hold theraband program due to  low platelets.  Educated pt on energy conservation and handouts given. He verbalizes understanding. He has been allowed to get up and use bathroom unassisted.  He requested RW be left by bedside, and I verified this was OK with nursing.  Pt has an elderly in law living with him at home and he can use high commode, walk in shower with seat and hand held shower.  He can also use reacher, if he wants to. Pt has plenty of assistance as needed     Vision       Perception     Praxis      Cognition Arousal/Alertness: Awake/alert Behavior During Therapy: WFL for tasks assessed/performed;Anxious Overall Cognitive Status: Within Functional Limits for tasks assessed                                          Exercises     Shoulder Instructions       General Comments      Pertinent Vitals/ Pain       Pain Assessment: No/denies pain  Home Living  Prior Functioning/Environment              Frequency           Progress Toward Goals  OT Goals(current goals can now be found in the care plan section)  Progress towards OT goals: Progressing toward goals  Acute Rehab OT Goals Patient Stated Goal: improve mobility   Plan      Co-evaluation                 AM-PAC PT "6 Clicks" Daily Activity     Outcome Measure   Help from another person eating meals?: A Little Help from another person taking care of personal grooming?: A Little Help from another person toileting, which includes using toliet, bedpan, or urinal?: A Little Help from another person bathing (including washing, rinsing, drying)?: A Little Help from another person to put on and taking off regular upper body clothing?: A Little Help from another person to put on and taking off regular lower body clothing?: A Lot 6 Click Score: 17    End of Session        Activity Tolerance Patient tolerated treatment well   Patient Left in  bed;with call bell/phone within reach   Nurse Communication          Time: 8138-8719 OT Time Calculation (min): 21 min  Charges: OT General Charges $OT Visit: 1 Visit OT Treatments $Therapeutic Activity: 8-22 mins  Lesle Chris, OTR/L Acute Rehabilitation Services [phone] [facility] pager [phone] office 10/29/2017   Cassia Fein 10/29/2017, 4:27 PM

## 2017-10-29 NOTE — Care Management Note (Signed)
Case Management Note  Patient Details  Name: [name] MRN: [id number] Date of Birth: 1961/01/16  Subjective/Objective:                  Discharge planning  Action/Plan: Message to [facility] dme for hospital bed  Expected Discharge Date:  10/15/17               Expected Discharge Plan:  Home/Self Care  In-House Referral:     Discharge planning Services  CM Consult  Post Acute Care Choice:    Choice offered to:     DME Arranged:    DME Agency:     HH Arranged:    HH Agency:     Status of Service:  In process, will continue to follow  If discussed at Long Length of Stay Meetings, dates discussed:    Additional Comments:  Leeroy Cha, RN 10/29/2017, 9:45 AM

## 2017-10-30 ENCOUNTER — Telehealth: Payer: Self-pay | Admitting: Nurse Practitioner

## 2017-10-30 ENCOUNTER — Inpatient Hospital Stay (HOSPITAL_COMMUNITY): Payer: 59

## 2017-10-30 DIAGNOSIS — K5900 Constipation, unspecified: Secondary | ICD-10-CM

## 2017-10-30 LAB — URINALYSIS, COMPLETE (UACMP) WITH MICROSCOPIC
BILIRUBIN URINE: NEGATIVE
KETONES UR: NEGATIVE mg/dL
LEUKOCYTES UA: NEGATIVE
NITRITE: NEGATIVE
PROTEIN: 30 mg/dL — AB
RBC / HPF: >50 RBC/hpf — ABNORMAL HIGH (ref 0–5)
Specific Gravity, Urine: 1.015 (ref 1.005–1.030)
pH: 5 (ref 5.0–8.0)

## 2017-10-30 LAB — BASIC METABOLIC PANEL
ANION GAP: 13 (ref 5–15)
BUN: 46 mg/dL — AB (ref 6–20)
CHLORIDE: 100 mmol/L (ref 98–111)
CO2: 28 mmol/L (ref 22–32)
Calcium: 8.9 mg/dL (ref 8.9–10.3)
Creatinine, Ser: 1.15 mg/dL (ref 0.61–1.24)
GFR calc Af Amer: >60 mL/min (ref >60)
GFR calc non Af Amer: >60 mL/min (ref >60)
GLUCOSE: 146 mg/dL — AB (ref 70–99)
POTASSIUM: 4.1 mmol/L (ref 3.5–5.1)
Sodium: 141 mmol/L (ref 135–145)

## 2017-10-30 LAB — HEPATIC FUNCTION PANEL
ALBUMIN: 2.5 g/dL — AB (ref 3.5–5.0)
ALT: 26 U/L (ref 0–44)
AST: 27 U/L (ref 15–41)
Alkaline Phosphatase: 46 U/L (ref 38–126)
BILIRUBIN INDIRECT: 0.9 mg/dL (ref 0.3–0.9)
BILIRUBIN TOTAL: 1.1 mg/dL (ref 0.3–1.2)
Bilirubin, Direct: 0.2 mg/dL (ref 0.0–0.2)
TOTAL PROTEIN: 5.3 g/dL — AB (ref 6.5–8.1)

## 2017-10-30 LAB — GLUCOSE, CAPILLARY: Glucose-Capillary: 168 mg/dL — ABNORMAL HIGH (ref 70–99)

## 2017-10-30 LAB — PLATELET COUNT: Platelets: 32 10*3/uL — ABNORMAL LOW (ref 150–400)

## 2017-10-30 MED ORDER — LACTULOSE 10 GM/15ML PO SOLN: 30.0000 g | Freq: Every day | ORAL | Status: DC | PRN

## 2017-10-30 MED ORDER — DARIFENACIN HYDROBROMIDE ER 7.5 MG PO TB24
7.5000 mg | ORAL_TABLET | Freq: Once | ORAL | Status: AC
  Administered 2017-10-30: 7.5 mg via ORAL
  Filled 2017-10-30: qty 1

## 2017-10-30 MED ORDER — SODIUM CHLORIDE 0.9% IV SOLUTION: Freq: Once | INTRAVENOUS | Status: DC

## 2017-10-30 MED ORDER — POLYETHYLENE GLYCOL 3350 17 G PO PACK
17.0000 g | PACK | Freq: Every day | ORAL | Status: DC
  Administered 2017-10-30 – 2017-11-02 (×3): 17 g via ORAL
  Filled 2017-10-30 (×3): qty 1

## 2017-10-30 MED ORDER — SORBITOL 70 % SOLN
30.0000 mL | Freq: Four times a day (QID) | Status: DC | PRN
  Administered 2017-10-30: 30 mL via ORAL
  Filled 2017-10-30: qty 30

## 2017-10-30 MED ORDER — SENNOSIDES-DOCUSATE SODIUM 8.6-50 MG PO TABS
1.0000 | ORAL_TABLET | Freq: Two times a day (BID) | ORAL | Status: DC
  Administered 2017-10-30 – 2017-11-02 (×7): 1 via ORAL
  Filled 2017-10-30 (×7): qty 1

## 2017-10-30 NOTE — Progress Notes (Signed)
Bladder scan reveals 49ml of urine.

## 2017-10-30 NOTE — Progress Notes (Signed)
Dr [name] returned call made aware of family concerns. Dr [name] reviewed urine results and xray results with the nurse. No emergent findings at this time. MD states he will call later to check on patient. Family has been updated for the plan of care.

## 2017-10-30 NOTE — Progress Notes (Addendum)
CRITICAL VALUE ALERT  Critical Value:  WBC:0.6 & PLT:6   Date & Time Notied:  10/30/17 0645  Provider Notified:SHADAD  Orders Received/Actions taken: ORDER 1 UNIT OF PLT.

## 2017-10-30 NOTE — Progress Notes (Signed)
Dr. [name] paged and made aware of patient not being able to tolerate sorbitol MD to orders xray to rule out obstruction. MD also made aware of no CMV negative platelets available in the hospital at this time and they will need to be ordered. This process may take several hours per blood bank.  MD agrees that it is appropriate to wait for now.

## 2017-10-30 NOTE — Progress Notes (Signed)
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  Admitted on 9/22 with neutropenic fever, started on broad-spectrum antibiotics. Critical care consulted 9/25 for acute on chronic hypoxic respiratory failure. Overall, has improved with initiation of chemo. On and off Bipap, O2 requirements have improved.   Significant Hospital Events    9/23: Infectious disease consulted.  Had developed mild worsening and chronic cough and shortness of breath recommended to continue vancomycin and cefepime possible pneumonia.  Was given Granix per hematology with plans to continue this until [place] was over 1000.  Chest x-ray with diffuse bilateral pulmonary infiltrates chronic in nature since late Aug 2019, 9/24: Having intermittent fever, cough a little bit worse.  One episode of nausea vomiting after forceful cough.  Room air oxygenation at 77% 9/25: Increased tachypnea.  Fluid challenge administered earlier.  Given IV Lasix.  Chest x-ray obtained showed progressive left lower lobe airspace disease, and persistent bilateral patchy airspace disease, oxygen requirements up to 7 L, CT chest negative for pulmonary emboli.  Moved to stepdown unit, trickle care asked to see for acute on chronic respiratory failure.  9/26: feels better. Still on high FIO2 cough and fever improved. Over all looking better. Neg almost 2 liters over 24 hrs but still > 4 liters + 9/27 still febrile still w/ high FIO2 needs systemic steroids started 9/27 through 9/30: Antibiotics continued.  Still requiring high flow oxygen and BiPAP alternating.  Aspergillosis antibody sent.  Steroid dosing increased on the 28th.  On the 30th still  very hypoxic rapidly desaturating.  Case was discussed with oncology at [facility] as well as [facility], the consensus was to proceed with DHAP chemotherapy, this was toc be initiated at reduced therapy due to recent hematologic toxicity 10/1, 10/2, 10/3 - Improving slowly, off/no BIPAP PRN 10/3 - Down to 45% FiO2, chemo + diuresis  10/4 - Continues to feel better.  FiO2 at 50%, using noninvasive much less.  Having nausea, typically has this after chemotherapy.  Serum creatinine climbing.  This was felt due to chemotherapy 10/5 through 10/7: Continues to improve, oxygen weaned down to 6 L nasal cannula, IV Lasix given 10/8: -9 L since admission.  Now down to 2 L nasal cannula 10/9: Still on 2 to 3 L nasal cannula feeling better from pulmonary status now having abdominal discomfort and constipation had a BM in the a.m. of 10/9 but still has some discomfort.  No fevers.  White blood cell count and platelets have dropped again he is now getting unit of platelets.  Consults: date of consult/date signed off & final recs:  Pulmonary consulted 9/25  Procedures (surgical and bedside):    Significant Diagnostic Tests:  Echocardiogram from 8/31: Ejection fraction 55 to 60% wall Motion was normal diastolic function normal wall  CT angiogram 9/25: Negative for pulmonary edema.  Increased mediastinal adenopathy, hilar adenopathy, and central groundglass opacities.  Also has left pleural effusion.  This looks small to moderate in size.  Pulmonary masses and right axillary adenopathy are stable.  Increased consolidation left lower lobe.  Micro Data: Respiratory virus panel 9/24: Negative Blood cultures obtained 9/23:>>> Urine culture 9/22: Negative  Antimicrobials:  Aztreonam 9/22 through  9/23 Cefepime 9/23>>>9/30 Metronidazole 9/22 through 9/23 Vancomycin 9/22>>>9/30 Diflucan X 5 days, oral thrush   Subjective:  Complaining of constipation  Objective   Blood pressure 129/90, pulse 76,  temperature 97.6 F (36.4 C), temperature source Oral, resp. rate 18, height 5\' 8"  (1.727 m), weight 79.3 kg, SpO2 93 %.        Intake/Output Summary (Last 24 hours) at 10/30/2017 1034 Last data filed at 10/30/2017 0336 Gross per 24 hour  Intake no documentation  Output 1025 ml  Net -1025 ml   Filed Weights   10/13/17 2121 10/28/17 1057 10/29/17 1047  Weight: 78.3 kg 67.2 kg 79.3 kg    Examination: General: 57 year old white male currently resting in the bed/chair no acute distress HEENT normocephalic atraumatic no jugular venous distention mucous membranes no thrush Pulmonary: Decreased left base otherwise clear no accessory use Cardiac: Regular rate and rhythm Abdomen: Soft nontender palpation but does have general discomfort Extremities: Dependent edema otherwise brisk cap refill Neuro: Awake oriented GU: Voids  Resolved Hospital Problem list     Assessment & Plan:   Acute on chronic hypoxic respiratory failure in the setting of diffuse pulmonary nodular infiltrates due to lymphoma involvement of the lung - unclear etiology of these nodular infiltrates, we are assuming this is related to his lymphoma - greatly improved, on 2 L-3 liters  Potable chest x-ray personally reviewed, left greater than right airspace disease no significant change but certainly not worse Plan Cont mobilization  Wean oxygen  Decadron per onc IS Pulse ox  Pancytopenia. W/ febrile neutropenia - ANA and WBC increased after Neupogen  -Platelet count continues to fall status post chemotherapy -Received 1 unit blood on 10/6 CBC improved Plan For transfusion today Cont C-CSF w/ daily diffs F/u am cbc Watch for fevers Low threshold for empiric abx  Steroid related hyperglycemia Plan ssi   T-cell lymphoma with metastasis to lung Plan Chemo per onc  History of superficial DVT Had been on Xarelto since 2018 Plan Holding ac d/t thrombocytopenia   Acute renal failure, problem pre-renal  component, diuresis Elevated BUN, related to steroids all improving as of 10/8; bumped today Plan Trend chemistry  Hold further lasix  Critical Illness Nutritional Support Debility  Plan Cont reg diet   Constipation w. abd discomfort Plan LOC F/u abd [place] F/u LFTs  Chemotherapy related nausea Plan PRN zofran   Disposition / Summary of Today's Plan 10/30/17   Feeling better after a bm. Counts down so will need to stay for another 48-72hrs. Will ask the hospitalist team to assume care.     Diet: regular  Pain/Anxiety/Delirium protocol (if indicated): Not indicated VAP protocol (if indicated): Not indicated DVT prophylaxis:  LMWH, placed on hold on 10/6 due to thrombocytopenia GI prophylaxis: Not indicated Hyperglycemia protocol: Sliding scale insulin Mobility: Out of bed.  Will need home physical therapy Code Status: Full code Family Communication: Family updated  Labs   CBC: Recent Labs  Lab 10/25/17 0405 10/27/17 0840 10/28/17 0230 10/29/17 0445 10/30/17 0619  WBC 30.7* 17.2* 11.7* 3.2* 0.6*  NEUTROABS 29.2* 17.0* 11.6* 3.1 0.5*  HGB 7.2* 6.9* 10.1* 9.8* 9.3*  HCT 20.6* 19.9* 28.8* 28.6* 28.1*  MCV 92.4 93.0 91.4 92.0 93.4  PLT 57* 40* 29* 15* 6*    Basic Metabolic Panel: Recent Labs  Lab 10/24/17 1034  10/26/17 0355 10/26/17 1700 10/28/17 0220 10/29/17 0445 10/30/17 0619  NA 140   < > 142 138 141 142 141  K 4.6   < >  4.9 4.3 4.1 4.0 4.1  CL 101   < > 105 100 100 100 100  CO2 25   < > 26 27 29 31 28   GLUCOSE 213*   < > 176* 272* 153* 164* 146*  BUN 78*   < > 81* 72* 53* 45* 46*  CREATININE 1.49*   < > 1.45* 1.18 1.06 0.92 1.15  CALCIUM 8.3*   < > 8.7* 8.6* 8.7* 8.7* 8.9  MG 2.4  --   --   --  1.9  --   --   PHOS 6.0*  --   --   --   --   --   --    < > = values in this interval not displayed.   GFR: Estimated Creatinine Clearance: 68.6 mL/min (by C-G formula based on SCr of 1.15 mg/dL). Recent Labs  Lab 10/27/17 0840 10/28/17 0230  10/29/17 0445 10/30/17 0619  WBC 17.2* 11.7* 3.2* 0.6*    Liver Function Tests: Recent Labs  Lab 10/24/17 1034 10/25/17 0405 10/28/17 0220 10/29/17 0445  AST 29 26 28 27   ALT 41 36 30 30  ALKPHOS 41 47 57 52  BILITOT 0.4 0.6 0.7 0.9  PROT 5.1* 4.8* 5.5* 5.3*  ALBUMIN 2.1* 2.1* 2.4* 2.4*   No results for input(s): LIPASE, AMYLASE in the last 168 hours. No results for input(s): AMMONIA in the last 168 hours.  ABG    Component Value Date/Time   PHART 7.418 10/16/2017 0802   PCO2ART 34.8 10/16/2017 0802   PO2ART 36.3 (LL) 10/16/2017 0802   HCO3 22.0 10/16/2017 0802   ACIDBASEDEF 1.5 10/16/2017 0802   O2SAT 62.9 10/16/2017 0802     Coagulation Profile: No results for input(s): INR, PROTIME in the last 168 hours.  Cardiac Enzymes: No results for input(s): CKTOTAL, CKMB, CKMBINDEX, TROPONINI in the last 168 hours.  HbA1C: No results found for: HGBA1C  CBG: Recent Labs  Lab 10/28/17 0735 10/28/17 1248 10/28/17 1717 10/28/17 1937 10/29/17 0311  GLUCAP 138* 260* 237* 178* 166*   ___________________________________________________________________________  Erick Colace ACNP-BC [place] Pager # [phone] OR # [phone] if no answer

## 2017-10-30 NOTE — Progress Notes (Signed)
PT Cancellation Note  Patient Details Name: [name] MRN: [id number] DOB: 04/18/1960   Cancelled Treatment:    Reason Eval/Treat Not Completed: Medical issues which prohibited therapy - Pt with platelet level of 6 this am. Pt waiting to receive platelets. PT to check back as schedule allows.  [name], PT Acute Rehabilitation Services Pager [phone]  Office [phone]    Delmont Prosch D Elonda Husky 10/30/2017, 11:59 AM

## 2017-10-30 NOTE — Telephone Encounter (Signed)
Tried to call regarding 10/14 °

## 2017-10-30 NOTE — Progress Notes (Signed)
Patient unable to tolerate oral sorbitol spit the medicine out. Will notify MD for more orders.

## 2017-10-30 NOTE — Progress Notes (Signed)
IP PROGRESS NOTE  Subjective:   [name] complains of constipation.  He continues to have a feeling of regurgitation.  No emesis.  He has dyspnea with exertion. Objective: Vital signs in last 24 hours: Blood pressure 129/90, pulse 76, temperature 97.6 F (36.4 C), temperature source Oral, resp. rate 18, height _0  (1.727 m), weight 174 lb 14.4 oz (79.3 kg), SpO2 93 %.  Intake/Output from previous day: 10/08 0701 - 10/09 0700 In: -  Out: 8119 [Urine:1425]  Physical Exam: HEENT: No thrush or bleeding Cardiac: Regular rate and rhythm Nodes: 1-2 cm right axillary node, no left axillary or inguinal nodes Lungs: Inspiratory rhonchi at the left greater than right posterior chest, decreased breath sounds at the left posterior base Abdomen: No hepatosplenomegaly, nontender, soft Extremities: Trace edema at the hands and feet  Portacath/PICC-without erythema  Lab Results: Recent Labs    10/29/17 0445 10/30/17 0619  WBC 3.2* 0.6*  HGB 9.8* 9.3*  HCT 28.6* 28.1*  PLT 15* 6*    BMET Recent Labs    10/29/17 0445 10/30/17 0619  NA 142 141  K 4.0 4.1  CL 100 100  CO2 31 28  GLUCOSE 164* 146*  BUN 45* 46*  CREATININE 0.92 1.15  CALCIUM 8.7* 8.9    Studies/Results: Dg Chest Port 1 View  Result Date: 10/29/2017 CLINICAL DATA:  Respiratory failure EXAM: PORTABLE CHEST 1 VIEW COMPARISON:  Portable chest x-ray of October 22, 2017 FINDINGS: There are persistent patchy airspace opacities bilaterally with near confluence in the left lower lung. Slight clearing in the left upper lobe is present. Airspace opacities on the right are fairly stable. There is a small left pleural effusion. The cardiac silhouette is top-normal in size. The pulmonary vascularity is not clearly engorged. The power port catheter tip projects over the distal third of the SVC. IMPRESSION: Persistent bilateral alveolar pneumonia possibly slightly improved since the previous study. Persistent small left pleural  effusion. No overt CHF. Electronically Signed   By: David  Martinique M.D.   On: 10/29/2017 09:07    Medications: I have reviewed the patient's current medications.  Assessment/Plan:  1. 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  PET scan 03/20/2016 with extensive bulky hypermetabolic nodal activity in the neck, chest, abdomen and pelvis; mild splenomegaly with diffusely mildly accentuated splenic activity  Staging bone marrow biopsy 03/23/2016-negative for involvement with lymphoma  Cycle 1 EPOCH beginning 03/23/2016  Cycle 2 [facility] 04/13/2016  Restaging PET scan at [facility] 05/08/2016-lymph nodes with various degrees of FDG activity in the neck, axilla, right written him, mesentery, pelvis, and groin. Indeterminate liver lesions without FDG activity, nodular mass in the posterior medial aspect of the left thigh  Cycle 3 EPOCH04/27/2018  Cycle 1 CVP 06/21/2016  Cytoxan/prednisone plus brentuximab 07/17/2016  Cytoxan/prednisone plus brentuximab 08/07/2016 (dose reduced)  Initiation of every three-week brentuximab08/08/2016  brentuximabdose reduced 10/31/2016 secondary to neuropathy  Brentuximab further dose reduced 01/03/2017 secondary to neuropathy  Presentation with parietal scalp nodular lesion 03/08/2017  Staging PET scan 1/[id number]/8295-[id number] hypermetabolic lymphadenopathy in the neck, chest, abdomen/pelvis, and hypermetabolic cutaneous lesions  03/22/2017 CT biopsy left retroperitoneal nodal mass-recurrent CD30 positive T-cell lymphoma  Cycle 1 bendamustine/CPI-613on clinical trial  at [facility] 04/08/2017  CTs 05/22/2017 After 2 cycles-mixed response: Enlargement of left supraclavicular node, right axillary node, right pelvic sidewall mass, decreased retroperitoneal abdominal adenopathy  Cycle 3 bendamustine/CPI-613 06/03/2017  Cycle 4 bendamustine/CPI-613 07/09/2017  CT 08/05/2017- increase in the size of a level to a lymph node, decreased size of level 3 and supraclavicular nodes, enlargement of right axillary nodes, new bilateral pulmonary nodules, decreased mediastinal, rectal peritoneal, and iliac nodes  Cycle 5 bendamustine/CPI-6137/16/2019  Cycle 6 bendamustine/CPI-613 09/02/2017  Cycle 1 cisplatin/gemcitabine 09/30/2017 by day 8 gemcitabine held secondary to neutropenia/thrombocytopenia  Cycle 1 DHAP 10/21/2017 2. 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 He entered clinical remission following chemotherapy and remained in remission when he was seen at the cancer center 02/24/2009. 3. 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 Staging PET scan 08/23/2010 with increased FDG activity at the left tonsillar fossa and no additional evidence of lymphoma. He completed 4 cycles of R-ICE with cycle #1 beginning on 09/19/2010 and cycle #4 on 11/21/2010. Repeat head and neck examination by Dr. Constance Holster following R-ICE/rituximab showed no residual lymphoma. He began radiation consolidation on 12/18/2010, radiation was completed on 01/22/2011. 4. History of neutropenia secondary to rituximab. 5. Anemia secondary to chemotherapy and phlebotomy 6. Thrombocytopenia secondary to chemotherapy 7. Hypertension. 8. 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 05/08/2017-nodular mass in the  left thigh adjacent to the femur  MRI 06/07/2016-infiltrative mass in the left thigh adductor muscle, separate from the schwannoma excision site  MRI 10/08/2016-marked improvement in the left adductormusclemass with a small area of enhancement remaining, no discrete mass 9. Port-A-Cath placement 03/21/2016 10. Superficial thrombus left greater saphenous vein 04/13/2016. Lovenox initiated, converted to Xarelto beginning 04/18/2016 11. 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 Admission 09/20/2017 with cough/fever/dyspnea-CT concerning for progression of lymphoma in the lungs versus pneumoniatreated with vancomycin/cefepime, started Levaquin 09/25/2017 13.  Admission 10/13/2017 with fever in the setting of severe neutropenia, no source for infection identified other than possible pneumonia 14.   Respiratory failure- most likely secondary to progression of non-Hodgkin's lymphoma in the lungs, improved 15.   Pancytopenia secondary chemotherapy       Mr. Meroney is now at day 10 following DHAP.  He has developed severe thrombocytopenia and neutropenia.  He will continue G-CSF.  We will transfuse platelets today. He will begin a bowel regimen for the constipation.  I encouraged him to eat while upright and continue ambulation as tolerated.  He continues nasal cannula oxygen.  Recommendations: 1.  Continue Decadron 2.  Continue G-CSF daily CBC/differential 3.  Transfuse platelets today, check 1 hour posttransfusion platelet count 4.  Begin broad-spectrum IV antibiotics for a fever 5.  Bowel regimen    LOS: 17 days   [name], MD   10/30/2017, 7:20 AM

## 2017-10-30 NOTE — Progress Notes (Addendum)
Patients daughter at bedside  Stating that patient is hurting much worse and yesterday. Oxycodone given per prn orders. Patient and family advised of the need to take pain meds as needed and made aware that pain meds have not been taken since last night because pt does not want to take due to fear of constipation. Daughter insisting that Dr. Learta Codding be notified because he has been with them for 15 years and she just wants him to know that the pain is worse than last night.  Dr. Learta Codding paged X 4, awaiting return call.

## 2017-10-31 ENCOUNTER — Inpatient Hospital Stay (HOSPITAL_COMMUNITY): Payer: 59

## 2017-10-31 DIAGNOSIS — M545 Low back pain: Secondary | ICD-10-CM

## 2017-10-31 LAB — CBC WITH DIFFERENTIAL/PLATELET
ABS IMMATURE GRANULOCYTES: 0.05 10*3/uL (ref 0.00–0.07)
Basophils Absolute: 0 10*3/uL (ref 0.0–0.1)
Basophils Relative: 0 %
Eosinophils Absolute: 0 10*3/uL (ref 0.0–0.5)
Eosinophils Relative: 2 %
HCT: 24.6 % — ABNORMAL LOW (ref 39.0–52.0)
HEMATOCRIT: 28.1 % — AB (ref 39.0–52.0)
HEMOGLOBIN: 9.3 g/dL — AB (ref 13.0–17.0)
HEMOGLOBIN: 8.1 g/dL — AB (ref 13.0–17.0)
Immature Granulocytes: 8 %
Lymphocytes Relative: 2 %
Lymphs Abs: 0 10*3/uL — ABNORMAL LOW (ref 0.7–4.0)
MCH: 30.9 pg (ref 26.0–34.0)
MCH: 31.2 pg (ref 26.0–34.0)
MCHC: 33.1 g/dL (ref 30.0–36.0)
MCHC: 32.9 g/dL (ref 30.0–36.0)
MCV: 93.4 fL (ref 80.0–100.0)
MCV: 94.6 fL (ref 80.0–100.0)
MONOS PCT: 5 %
Monocytes Absolute: 0 10*3/uL — ABNORMAL LOW (ref 0.1–1.0)
NEUTROS ABS: 0.5 10*3/uL — AB (ref 1.7–7.7)
NEUTROS PCT: 83 %
NRBC: 0 % (ref 0.0–0.2)
Platelets: 6 10*3/uL — CL (ref 150–400)
Platelets: 19 10*3/uL — CL (ref 150–400)
RBC: 3.01 MIL/uL — ABNORMAL LOW (ref 4.22–5.81)
RBC: 2.6 MIL/uL — AB (ref 4.22–5.81)
RDW: 14 % (ref 11.5–15.5)
RDW: 13.7 % (ref 11.5–15.5)
WBC: 0.6 10*3/uL — CL (ref 4.0–10.5)
WBC: 0.2 10*3/uL — CL (ref 4.0–10.5)
nRBC: 0 % (ref 0.0–0.2)

## 2017-10-31 LAB — BASIC METABOLIC PANEL
Anion gap: 12 (ref 5–15)
BUN: 43 mg/dL — AB (ref 6–20)
CHLORIDE: 99 mmol/L (ref 98–111)
CO2: 30 mmol/L (ref 22–32)
Calcium: 8.7 mg/dL — ABNORMAL LOW (ref 8.9–10.3)
Creatinine, Ser: 1.25 mg/dL — ABNORMAL HIGH (ref 0.61–1.24)
GFR calc Af Amer: >60 mL/min (ref >60)
GFR calc non Af Amer: >60 mL/min (ref >60)
GLUCOSE: 164 mg/dL — AB (ref 70–99)
POTASSIUM: 4 mmol/L (ref 3.5–5.1)
Sodium: 141 mmol/L (ref 135–145)

## 2017-10-31 LAB — BPAM PLATELET PHERESIS
Blood Product Expiration Date: 201910092359
ISSUE DATE / TIME: 201910091300
Unit Type and Rh: 6200

## 2017-10-31 LAB — PREPARE PLATELET PHERESIS: UNIT DIVISION: 0

## 2017-10-31 MED ORDER — IOHEXOL 300 MG/ML  SOLN
100.0000 mL | Freq: Once | INTRAMUSCULAR | Status: AC | PRN
  Administered 2017-10-31: 100 mL via INTRAVENOUS

## 2017-10-31 MED ORDER — CIPROFLOXACIN HCL 500 MG PO TABS
500.0000 mg | ORAL_TABLET | Freq: Two times a day (BID) | ORAL | Status: DC
  Administered 2017-10-31 – 2017-11-02 (×4): 500 mg via ORAL
  Filled 2017-10-31 (×4): qty 1

## 2017-10-31 MED ORDER — HYDROMORPHONE HCL 1 MG/ML IJ SOLN
1.0000 mg | INTRAMUSCULAR | Status: DC | PRN
  Administered 2017-10-31 – 2017-11-01 (×4): 1 mg via INTRAVENOUS
  Filled 2017-10-31 (×4): qty 1

## 2017-10-31 MED ORDER — IOPAMIDOL (ISOVUE-300) INJECTION 61%
INTRAVENOUS | Status: AC
  Filled 2017-10-31: qty 30

## 2017-10-31 MED ORDER — IOPAMIDOL (ISOVUE-300) INJECTION 61%
30.0000 mL | Freq: Once | INTRAVENOUS | Status: AC | PRN
  Administered 2017-10-31: 30 mL via ORAL

## 2017-10-31 MED ORDER — IOHEXOL 300 MG/ML  SOLN: 100.0000 mL | Freq: Once | INTRAMUSCULAR | Status: DC | PRN

## 2017-10-31 MED ORDER — SODIUM CHLORIDE 0.9 % IV SOLN
INTRAVENOUS | Status: DC
  Administered 2017-10-31: 09:00:00 via INTRAVENOUS
  Administered 2017-11-01: 75 mL/h via INTRAVENOUS

## 2017-10-31 MED ORDER — SODIUM CHLORIDE 0.9 % IJ SOLN
INTRAMUSCULAR | Status: AC
  Administered 2017-11-01: 75 mL/h via INTRAVENOUS
  Filled 2017-10-31: qty 50

## 2017-10-31 MED ORDER — HYDROMORPHONE HCL 1 MG/ML IJ SOLN
1.0000 mg | INTRAMUSCULAR | Status: AC | PRN
  Administered 2017-10-31 (×2): 1 mg via INTRAVENOUS
  Filled 2017-10-31 (×2): qty 1

## 2017-10-31 MED ORDER — PHENAZOPYRIDINE HCL 100 MG PO TABS
100.0000 mg | ORAL_TABLET | Freq: Once | ORAL | Status: AC
  Administered 2017-10-31: 100 mg via ORAL
  Filled 2017-10-31: qty 1

## 2017-10-31 NOTE — Progress Notes (Signed)
IP PROGRESS NOTE  Subjective:   [name] complains of low back pain.  Oxycodone did not relieve the pain.  He obtained relief when he received morphine last night.  He cannot get comfortable in the bed or chair secondary to lower back pain.  No leg pain or numbness.  He continues to pass small amounts of urine. Objective: Vital signs in last 24 hours: Blood pressure (!) 131/96, pulse 76, temperature 97.9 F (36.6 C), temperature source Oral, resp. rate 17, height '5\' 8"'$  (1.727 m), weight 174 lb 14.4 oz (79.3 kg), SpO2 95 %.  Intake/Output from previous day: 10/09 0701 - 10/10 0700 In: 436 [P.O.:150; Blood:286] Out: 295 [Urine:295]  Physical Exam: HEENT: No thrush or bleeding Cardiac: Regular rate and rhythm Nodes: 1-2 cm right axillary node, no left axillary or inguinal nodes Lungs: Scattered inspiratory rales and rhonchi, no respiratory distress Abdomen: No hepatosplenomegaly, nontender, soft no mass Extremities: Trace edema at the hands and feet Musculoskeletal: The area of discomfort is at the lower lumbar/sacral area and right iliac.  No mass or rash.  Portacath/PICC-without erythema  Lab Results: Recent Labs    10/30/17 0619 10/30/17 1843 10/31/17 0629  WBC 0.6*  --  0.2*  HGB 9.3*  --  8.1*  HCT 28.1*  --  24.6*  PLT 6* 32* 19*    BMET Recent Labs    10/29/17 0445 10/30/17 0619  NA 142 141  K 4.0 4.1  CL 100 100  CO2 31 28  GLUCOSE 164* 146*  BUN 45* 46*  CREATININE 0.92 1.15  CALCIUM 8.7* 8.9    Studies/Results: Dg Abd 2 Views  Result Date: 10/30/2017 CLINICAL DATA:  GE reflux symptoms. Constipation. Shortness of breath on exertion. Current history of pulmonary non-Hodgkin's lymphoma for which the patient is undergoing chemotherapy. Severe thrombocytopenia and neutropenia. EXAM: ABDOMEN - 2 VIEW COMPARISON:  PET-CT 03/15/2017, 05/08/2016 and earlier. Chest x-rays yesterday and earlier including CT chest 10/16/2017. FINDINGS: Bowel gas pattern unremarkable  without evidence of obstruction or significant ileus. No evidence of free air or significant air-fluid levels on the erect image. Expected stool burden in the colon. Air-fluid levels in the colon indicates liquid stool. Phleboliths in both sides of the low pelvis. No visible opaque urinary tract calculi. Nodular and airspace opacities at the lung bases and small LEFT pleural effusion noted on the ERECT image as noted on yesterday's chest x-ray. IMPRESSION: 1. No acute abdominal abnormality. 2. Nodular and airspace opacities in the lung bases and small LEFT pleural effusion as noted on yesterday's chest x-ray. Electronically Signed   By: Evangeline Dakin M.D.   On: 10/30/2017 16:19    Medications: I have reviewed the patient's current medications.  Assessment/Plan:  1. 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  PET scan 03/20/2016 with extensive bulky hypermetabolic nodal activity in the neck, chest, abdomen and pelvis; mild splenomegaly with diffusely mildly accentuated splenic activity  Staging bone marrow biopsy 03/23/2016-negative for involvement with lymphoma  Cycle 1 EPOCH beginning 03/23/2016  Cycle 2 [facility] 04/13/2016  Restaging PET scan at [facility] 05/08/2016-lymph nodes with various degrees of FDG activity in the neck, axilla, right written him, mesentery, pelvis, and groin. Indeterminate  liver lesions without FDG activity, nodular mass in the posterior medial aspect of the left thigh  Cycle 3 EPOCH04/27/2018  Cycle 1 CVP 06/21/2016  Cytoxan/prednisone plus brentuximab  07/17/2016  Cytoxan/prednisone plus brentuximab 08/07/2016 (dose reduced)  Initiation of every three-week brentuximab08/08/2016  brentuximabdose reduced 10/31/2016 secondary to neuropathy  Brentuximab further dose reduced 01/03/2017 secondary to neuropathy  Presentation with parietal scalp nodular lesion 03/08/2017  Staging PET scan 8/92/1194-[id number] hypermetabolic lymphadenopathy in the neck, chest, abdomen/pelvis, and hypermetabolic cutaneous lesions  03/22/2017 CT biopsy left retroperitoneal nodal mass-recurrent CD30 positive T-cell lymphoma  Cycle 1 bendamustine/CPI-613on clinical trial at [facility] 04/08/2017  CTs 05/22/2017 After 2 cycles-mixed response: Enlargement of left supraclavicular node, right axillary node, right pelvic sidewall mass, decreased retroperitoneal abdominal adenopathy  Cycle 3 bendamustine/CPI-613 06/03/2017  Cycle 4 bendamustine/CPI-613 07/09/2017  CT 08/05/2017- increase in the size of a level to a lymph node, decreased size of level 3 and supraclavicular nodes, enlargement of right axillary nodes, new bilateral pulmonary nodules, decreased mediastinal, rectal peritoneal, and iliac nodes  Cycle 5 bendamustine/CPI-6137/16/2019  Cycle 6 bendamustine/CPI-613 09/02/2017  Cycle 1 cisplatin/gemcitabine 09/30/2017 by day 8 gemcitabine held secondary to neutropenia/thrombocytopenia  Cycle 1 DHAP 10/21/2017 2. 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 He entered clinical remission following chemotherapy and remained in remission when he was seen at the cancer center 02/24/2009. 3. 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 Staging PET scan 08/23/2010 with increased FDG activity at the left tonsillar fossa and no additional evidence of  lymphoma. He completed 4 cycles of R-ICE with cycle #1 beginning on 09/19/2010 and cycle #4 on 11/21/2010. Repeat head and neck examination by Dr. Constance Holster following R-ICE/rituximab showed no residual lymphoma. He began radiation consolidation on 12/18/2010, radiation was completed on 01/22/2011. 4. History of neutropenia secondary to rituximab. 5. Anemia secondary to chemotherapy and phlebotomy 6. Thrombocytopenia secondary to chemotherapy 7. Hypertension. 8. 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 05/08/2017-nodular mass in the left thigh adjacent to the femur  MRI 06/07/2016-infiltrative mass in the left thigh adductor muscle, separate from the schwannoma excision site  MRI 10/08/2016-marked improvement in the left adductormusclemass with a small area of enhancement remaining, no discrete mass 9. Port-A-Cath placement 03/21/2016 10. Superficial thrombus left greater saphenous vein 04/13/2016. Lovenox initiated, converted to Xarelto beginning 04/18/2016 11. 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 Admission 09/20/2017 with cough/fever/dyspnea-CT concerning for progression of lymphoma in the lungs versus pneumoniatreated with vancomycin/cefepime, started Levaquin 09/25/2017 13.  Admission 10/13/2017 with fever in the setting of severe neutropenia, no source for infection identified other than possible pneumonia 14.   Respiratory failure- most likely secondary to progression of non-Hodgkin's lymphoma in the lungs, improved 15.   Pancytopenia secondary chemotherapy 16.   Low back pain-etiology unclear       [name] is now at day 11 following DHAP.  He has persistent severe neutropenia.  He will continue G-CSF.  The severe thrombocytopenia responded to a platelet transfusion yesterday.   He appears stable from a respiratory  standpoint.  He remains on nasal cannula oxygen.  He has developed severe pain the lower back.  A urinalysis revealed red blood cells, but is otherwise unremarkable.  A culture is pending.  The etiology of the back pain is unclear.  He will be scheduled for a CT abdomen/pelvis to look for evidence of hemorrhage and lymphoma.  Recommendations: 1.  Continue Decadron 2.  Continue G-CSF daily CBC/differential 3.  CT abdomen/pelvis today 4.  Begin broad-spectrum IV antibiotics for a fever 5.  Bowel regimen    LOS: 18 days   [name], MD   10/31/2017, 7:45 AM

## 2017-10-31 NOTE — Progress Notes (Signed)
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 Critical care consulted 9/25 for acute on chronic hypoxic respiratory failure. Overall, has improved with initiation of chemo. On and off Bipap, O2 requirements have improved down to 2L and he was transferred to the floor 10/10. He has been endorsing worse lower back pain for the past few days for which CT abd/pelvis was ordered by oncology consultant.    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 Pt has PCN allergy.  - Cipro - Urine culture pending  Back pain: CT abd/pelvis without definite culprit, but requiring IV dilaudid for now.  - Monitor, no neuro findings.   Acute on chronic hypoxic respiratory failure: Due to lymphoma/progressive nodular pulmonary infiltrates. Improved, out of ICU 10/10.  - CT abd/pelvis glimpse of lungs shows interval improvement.  - Continue O2 - Continue steroids - IS/mobilize, improving.  Pancytopenia, febrile neutropenia: Due to chemotherapy. s/p platelet transfusion 10/9 with improvement.  - Continue GCSF per heme/onc - Monitor CBC daily,  transfuse prn  - At risk of infection, so will start abx as above  T-cell lymphoma:  - Per onc, on chemo  History of DVT:  - Holding anticoagulation due to bleeding risk.  Acute renal failure: With disproportionate BUN rise likely due to decadron.  - Monitor, current CrCl >63ml/min.  - Holding lasix today, recheck BMP in AM  Constipation: Relieved thus far.  - Continue bowel regimen  Nausea: Related to chemotherapy.  - Continue zofran  Steroid-induced hyperglycemia:  - Continue SSI  DVT prophylaxis: SCDs Code Status: Full Family Communication: Wife at bedside Disposition Plan: Uncertain  Consultants:   Oncology  PCCM  Procedures:  Echocardiogram from 8/31: Ejection fraction 55 to 60% wall Motion was normal diastolic function normal wall  CT angiogram 9/25: Negative for pulmonary edema.  Increased mediastinal adenopathy, hilar adenopathy, and central groundglass opacities.  Also has left pleural effusion.  This looks small to moderate in size.  Pulmonary masses and right axillary adenopathy are stable.  Increased consolidation left lower lobe.  Antimicrobials:  Aztreonam 9/22 through 9/23 Cefepime 9/23>>>9/30 Metronidazole 9/22 through 9/23 Vancomycin 9/22>>>9/30 Diflucan X 5 days, oral thrush   Subjective: Has dull aching back pain not better with any movements and not worse with any movements/position. Somewhat radiates to the right. No urinary pain/urgency, some frequency. No gross hematuria. Some bleeding in stool which is not new. Not worse, actually a little better.   Objective: Vitals:   10/30/17 1630 10/30/17 2146 10/31/17 0541 10/31/17 1516  BP: (!) 175/105 (!) 142/90 (!) 131/96 110/88  Pulse: 89 79 76 88  Resp: 18 18 17 18   Temp: 97.8 F (36.6 C) 98  F (36.7 C) 97.9 F (36.6 C) 97.8 F (36.6 C)  TempSrc: Oral Oral Oral Oral  SpO2: 90% 94% 95% (!) 88%  Weight:      Height:        Intake/Output Summary (Last 24 hours) at 10/31/2017 1854 Last  data filed at 10/31/2017 0653 Gross per 24 hour  Intake 0 ml  Output 295 ml  Net -295 ml   Filed Weights   10/13/17 2121 10/28/17 1057 10/29/17 1047  Weight: 78.3 kg 67.2 kg 79.3 kg    Gen: 57 y.o. male in no distress  Pulm: Non-labored breathing 2LPM. Decreased bases L>R CV: Regular rate and rhythm. No murmur, rub, or gallop. No JVD, 1+ depedent pedal edema. GI: Abdomen soft, non-tender, non-distended, with normoactive bowel sounds. No organomegaly or masses felt. MSK: Mild tenderness to palpation along lower back and right paraspinal musculature but very very mild. No CVA tenderness.  Skin: No rashes, lesions or ulcers Neuro: Alert and oriented. No focal neurological deficits. Psych: Judgement and insight appear normal. Mood & affect appropriate.   Data Reviewed: I have personally reviewed following labs and imaging studies  CBC: Recent Labs  Lab 10/25/17 0405 10/27/17 0840 10/28/17 0230 10/29/17 0445 10/30/17 0619 10/30/17 1843 10/31/17 0629  WBC 30.7* 17.2* 11.7* 3.2* 0.6*  --  0.2*  NEUTROABS 29.2* 17.0* 11.6* 3.1 0.5*  --   --   HGB 7.2* 6.9* 10.1* 9.8* 9.3*  --  8.1*  HCT 20.6* 19.9* 28.8* 28.6* 28.1*  --  24.6*  MCV 92.4 93.0 91.4 92.0 93.4  --  94.6  PLT 57* 40* 29* 15* 6* 32* 19*   Basic Metabolic Panel: Recent Labs  Lab 10/26/17 1700 10/28/17 0220 10/29/17 0445 10/30/17 0619 10/31/17 0845  NA 138 141 142 141 141  K 4.3 4.1 4.0 4.1 4.0  CL 100 100 100 100 99  CO2 27 29 31 28 30   GLUCOSE 272* 153* 164* 146* 164*  BUN 72* 53* 45* 46* 43*  CREATININE 1.18 1.06 0.92 1.15 1.25*  CALCIUM 8.6* 8.7* 8.7* 8.9 8.7*  MG  --  1.9  --   --   --    GFR: Estimated Creatinine Clearance: 63.1 mL/min (A) (by C-G formula based on SCr of 1.25 mg/dL (H)). Liver Function Tests: Recent Labs  Lab 10/25/17 0405 10/28/17 0220 10/29/17 0445 10/30/17 1843  AST 26 28 27 27   ALT 36 30 30 26   ALKPHOS 47 57 52 46  BILITOT 0.6 0.7 0.9 1.1  PROT 4.8* 5.5* 5.3* 5.3*    ALBUMIN 2.1* 2.4* 2.4* 2.5*   No results for input(s): LIPASE, AMYLASE in the last 168 hours. No results for input(s): AMMONIA in the last 168 hours. Coagulation Profile: No results for input(s): INR, PROTIME in the last 168 hours. Cardiac Enzymes: No results for input(s): CKTOTAL, CKMB, CKMBINDEX, TROPONINI in the last 168 hours. BNP (last 3 results) No results for input(s): PROBNP in the last 8760 hours. HbA1C: No results for input(s): HGBA1C in the last 72 hours. CBG: Recent Labs  Lab 10/28/17 1248 10/28/17 1717 10/28/17 1937 10/29/17 0311 10/30/17 1800  GLUCAP 260* 237* 178* 166* 168*   Lipid Profile: No results for input(s): CHOL, HDL, LDLCALC, TRIG, CHOLHDL, LDLDIRECT in the last 72 hours. Thyroid Function Tests: No results for input(s): TSH, T4TOTAL, FREET4, T3FREE, THYROIDAB in the last 72 hours. Anemia Panel: No results for input(s): VITAMINB12, FOLATE, FERRITIN, TIBC, IRON, RETICCTPCT in the last 72 hours. Urine analysis:    Component  Value Date/Time   COLORURINE YELLOW 10/30/2017 [place] 10/30/2017 1555   LABSPEC 1.015 10/30/2017 1555   LABSPEC 1.025 10/23/2010 0822   PHURINE 5.0 10/30/2017 1555   GLUCOSEU >=500 (A) 10/30/2017 1555   HGBUR LARGE (A) 10/30/2017 1555   BILIRUBINUR NEGATIVE 10/30/2017 1555   BILIRUBINUR Negative 10/23/2010 0822   KETONESUR NEGATIVE 10/30/2017 1555   PROTEINUR 30 (A) 10/30/2017 1555   UROBILINOGEN 0.2 11/23/2010 1348   NITRITE NEGATIVE 10/30/2017 1555   LEUKOCYTESUR NEGATIVE 10/30/2017 1555   LEUKOCYTESUR Negative 10/23/2010 0822   No results found for this or any previous visit (from the past 240 hour(s)).    Radiology Studies: Ct Abdomen Pelvis W Contrast  Result Date: 10/31/2017 CLINICAL DATA:  Severe low back pain.  History of lymphoma. EXAM: CT ABDOMEN AND PELVIS WITH CONTRAST TECHNIQUE: Multidetector CT imaging of the abdomen and pelvis was performed using the standard protocol following bolus  administration of intravenous contrast. CONTRAST:  184mL OMNIPAQUE IOHEXOL 300 MG/ML SOLN, 47mL ISOVUE-300 IOPAMIDOL (ISOVUE-300) INJECTION 61% COMPARISON:  Chest CT 10/16/2017 FINDINGS: Lower chest: Innumerable pulmonary lesions as demonstrated previously. These are stable to slightly enlarged when compared to the prior chest CT. However, there was significant breathing motion artifact on the prior study making it difficult to measure accurately. The associated airspace process seen on the prior CT has improved. There is a persistent moderate-sized left pleural effusion with significant overlying atelectasis. Hepatobiliary: Stable hepatic cysts and hemangiomas. No new or worrisome lesions. The gallbladder is normal. No common bile duct dilatation. Pancreas: No mass, inflammation or ductal dilatation. Spleen: Normal size. Stable small cystic area in the upper anterior aspect of the spleen. Adrenals/Urinary Tract: The adrenal glands and kidneys are unremarkable and stable. Thick-walled bladder with enhancement of the mucosa suggesting cystitis. Stomach/Bowel: The stomach, duodenum, small bowel and colon are grossly normal. No acute inflammatory changes, mass lesions or obstructive findings. Vascular/Lymphatic: The aorta and branch vessels are patent. The major venous structures are patent. Extensive mesenteric and retroperitoneal lymphadenopathy. Significant interval enlargement of a hepatic duodenum ligament lymph node on image number 28. It measures 2.8 x 2.7 cm and on the prior chest CT measured 15.5 x 11.5 mm. Celiac axis lymph node on image number 22 measures 24 x 18.5 mm and previously measured 14 x 5 mm. Several mesenteric lymph nodes are also larger when compared to the prior chest CT and PET-CT. However, several of the retroperitoneal lymph nodes are definitely smaller and there is interstitial changes around the nodes suggesting treatment changes. Several of these nodes demonstrate enhancement. The inter  aortocaval lymph node on the prior study measured 23 x 18.5 mm and is no longer measurable. Reproductive: The prostate gland and seminal vesicles are unremarkable. Other: There is a small amount of free pelvic fluid. No pelvic abscess. No free air. No inguinal mass or adenopathy. Musculoskeletal: No obvious bone lesions. No pathologic fracture. No large disc protrusion or soft tissue mass in the spinal canal. IMPRESSION: 1. Improved aeration of the lung bases but persistent and slightly progressive pulmonary nodules. 2. Progressive periportal and mesenteric adenopathy but definite significant regression of retroperitoneal adenopathy seen on the prior PET-CT. 3. Thick-walled bladder with mucosal enhancement suspicious for cystitis. Electronically Signed   By: Marijo Sanes M.D.   On: 10/31/2017 17:14   Dg Abd 2 Views  Result Date: 10/30/2017 CLINICAL DATA:  GE reflux symptoms. Constipation. Shortness of breath on exertion. Current history of pulmonary non-Hodgkin's lymphoma for which the patient is  undergoing chemotherapy. Severe thrombocytopenia and neutropenia. EXAM: ABDOMEN - 2 VIEW COMPARISON:  PET-CT 03/15/2017, 05/08/2016 and earlier. Chest x-rays yesterday and earlier including CT chest 10/16/2017. FINDINGS: Bowel gas pattern unremarkable without evidence of obstruction or significant ileus. No evidence of free air or significant air-fluid levels on the erect image. Expected stool burden in the colon. Air-fluid levels in the colon indicates liquid stool. Phleboliths in both sides of the low pelvis. No visible opaque urinary tract calculi. Nodular and airspace opacities at the lung bases and small LEFT pleural effusion noted on the ERECT image as noted on yesterday's chest x-ray. IMPRESSION: 1. No acute abdominal abnormality. 2. Nodular and airspace opacities in the lung bases and small LEFT pleural effusion as noted on yesterday's chest x-ray. Electronically Signed   By: Evangeline Dakin M.D.   On:  10/30/2017 16:19    Scheduled Meds: . sodium chloride   Intravenous Once  . atenolol  50 mg Oral Daily  . Chlorhexidine Gluconate Cloth  6 each Topical Q0600  . chlorpheniramine-HYDROcodone  5 mL Oral Once  . ciprofloxacin  500 mg Oral BID  . dexamethasone  4 mg Oral BID  . feeding supplement  1 Container Oral TID BM  . iopamidol      . loratadine  10 mg Oral Daily  . mouth rinse  15 mL Mouth Rinse BID  . multivitamin  1 tablet Oral Daily  . pantoprazole  40 mg Oral Daily  . polyethylene glycol  17 g Oral Daily  . senna-docusate  1 tablet Oral BID  . sodium chloride      . Tbo-Filgrastim  480 mcg Subcutaneous Daily  . thiamine  100 mg Oral Daily   Continuous Infusions: . sodium chloride 75 mL/hr at 10/31/17 0843     LOS: 18 days   Time spent: 25 minutes.  Patrecia Pour, MD [facility] [email or web address] Password [email or web address] 10/31/2017, 6:54 PM

## 2017-10-31 NOTE — Care Management (Signed)
This CM spoke with pt and wife at bedside for DC planning. Hospital bed ordered and [facility] to deliver tomorrow. Pt already active with [facility] for home 02 and would like to use them for home health services as well. [facility] rep alerted of referral. Will need MD orders for HHPT. Marney Doctor RN,BSN [phone]

## 2017-10-31 NOTE — Progress Notes (Signed)
PT Cancellation Note  Patient Details Name: [name] MRN: [id number] DOB: February 17, 1960   Cancelled Treatment:    Reason Eval/Treat Not Completed: Fatigue/lethargy limiting ability to participate - Pt with severe back pain for the last 24 hours, just got back from CT to determine cause of back and abdominal pain. Pt also just received IV dilaudid and feels lethargic. PT to hold today per pt request, will check back with pt tomorrow.   [name], PT Acute Rehabilitation Services Pager [phone]  Office [phone]    Roxine Caddy D Elonda Husky 10/31/2017, 3:01 PM

## 2017-11-01 LAB — CBC WITH DIFFERENTIAL/PLATELET
ABS IMMATURE GRANULOCYTES: 0.09 10*3/uL — AB (ref 0.00–0.07)
Basophils Absolute: 0 10*3/uL (ref 0.0–0.1)
Basophils Relative: 0 %
EOS PCT: 0 %
Eosinophils Absolute: 0 10*3/uL (ref 0.0–0.5)
HEMATOCRIT: 23.9 % — AB (ref 39.0–52.0)
Hemoglobin: 7.8 g/dL — ABNORMAL LOW (ref 13.0–17.0)
IMMATURE GRANULOCYTES: 18 %
LYMPHS ABS: 0 10*3/uL — AB (ref 0.7–4.0)
LYMPHS PCT: 4 %
MCH: 31.2 pg (ref 26.0–34.0)
MCHC: 32.6 g/dL (ref 30.0–36.0)
MCV: 95.6 fL (ref 80.0–100.0)
MONO ABS: 0.1 10*3/uL (ref 0.1–1.0)
Monocytes Relative: 26 %
NEUTROS ABS: 0.3 10*3/uL — AB (ref 1.7–7.7)
Neutrophils Relative %: 52 %
PLATELETS: 10 10*3/uL — AB (ref 150–400)
RBC: 2.5 MIL/uL — ABNORMAL LOW (ref 4.22–5.81)
RDW: 13.9 % (ref 11.5–15.5)
WBC: 0.5 10*3/uL — AB (ref 4.0–10.5)
nRBC: 0 % (ref 0.0–0.2)

## 2017-11-01 LAB — COMPREHENSIVE METABOLIC PANEL
ALBUMIN: 2 g/dL — AB (ref 3.5–5.0)
ALT: 21 U/L (ref 0–44)
AST: 21 U/L (ref 15–41)
Alkaline Phosphatase: 34 U/L — ABNORMAL LOW (ref 38–126)
Anion gap: 12 (ref 5–15)
BILIRUBIN TOTAL: 0.8 mg/dL (ref 0.3–1.2)
BUN: 42 mg/dL — AB (ref 6–20)
CHLORIDE: 98 mmol/L (ref 98–111)
CO2: 26 mmol/L (ref 22–32)
CREATININE: 1.1 mg/dL (ref 0.61–1.24)
Calcium: 7.7 mg/dL — ABNORMAL LOW (ref 8.9–10.3)
GFR calc Af Amer: >60 mL/min (ref >60)
GFR calc non Af Amer: >60 mL/min (ref >60)
GLUCOSE: 212 mg/dL — AB (ref 70–99)
POTASSIUM: 3.9 mmol/L (ref 3.5–5.1)
Sodium: 136 mmol/L (ref 135–145)
Total Protein: 4.7 g/dL — ABNORMAL LOW (ref 6.5–8.1)

## 2017-11-01 LAB — URINE CULTURE: Culture: NO GROWTH

## 2017-11-01 LAB — LACTATE DEHYDROGENASE: LDH: 398 U/L — ABNORMAL HIGH (ref 98–192)

## 2017-11-01 MED ORDER — HYDROMORPHONE HCL 2 MG PO TABS
2.0000 mg | ORAL_TABLET | ORAL | Status: DC | PRN
  Administered 2017-11-01 (×3): 4 mg via ORAL
  Administered 2017-11-01 – 2017-11-02 (×2): 2 mg via ORAL
  Filled 2017-11-01 (×2): qty 1
  Filled 2017-11-01 (×3): qty 2

## 2017-11-01 MED ORDER — METHOCARBAMOL 500 MG PO TABS
500.0000 mg | ORAL_TABLET | Freq: Four times a day (QID) | ORAL | Status: DC | PRN
  Administered 2017-11-01 – 2017-11-02 (×4): 500 mg via ORAL
  Filled 2017-11-01 (×4): qty 1

## 2017-11-01 NOTE — Progress Notes (Signed)
IP PROGRESS NOTE  Subjective:   [name] continues to have low back pain.  The pain is worse with standing and ambulation.  Oxycodone does not help.  He feels better from a respiratory standpoint.  He is passing small amounts of urine.  No abdominal pain.  The reflux symptoms have improved. Objective: Vital signs in last 24 hours: Blood pressure 112/86, pulse 74, temperature 98.4 F (36.9 C), temperature source Oral, resp. rate 17, height 5' 8" (1.727 m), weight 174 lb 14.4 oz (79.3 kg), SpO2 96 %.  Intake/Output from previous day: 10/10 0701 - 10/11 0700 In: 1205 [I.V.:1205] Out: 900 [Urine:900]  Physical Exam: HEENT: No thrush or bleeding Cardiac: Regular rate and rhythm Nodes:2 cm right axillary node, no left axillary or inguinal nodes Lungs: Inspiratory rales/rhonchi throughout the bilateral posterior chest, no respiratory distress Abdomen: No hepatosplenomegaly, nontender, soft, no mass Extremities: No leg edema Musculoskeletal: No spine tenderness  Portacath/PICC-without erythema  Lab Results: Recent Labs    10/31/17 0629 11/01/17 0435  WBC 0.2* 0.5*  HGB 8.1* 7.8*  HCT 24.6* 23.9*  PLT 19* 10*    BMET Recent Labs    10/31/17 0845 11/01/17 0435  NA 141 136  K 4.0 3.9  CL 99 98  CO2 30 26  GLUCOSE 164* 212*  BUN 43* 42*  CREATININE 1.25* 1.10  CALCIUM 8.7* 7.7*    Studies/Results: Ct Abdomen Pelvis W Contrast  Result Date: 10/31/2017 CLINICAL DATA:  Severe low back pain.  History of lymphoma. EXAM: CT ABDOMEN AND PELVIS WITH CONTRAST TECHNIQUE: Multidetector CT imaging of the abdomen and pelvis was performed using the standard protocol following bolus administration of intravenous contrast. CONTRAST:  159m OMNIPAQUE IOHEXOL 300 MG/ML SOLN, 364mISOVUE-300 IOPAMIDOL (ISOVUE-300) INJECTION 61% COMPARISON:  Chest CT 10/16/2017 FINDINGS: Lower chest: Innumerable pulmonary lesions as demonstrated previously. These are stable to slightly enlarged when compared  to the prior chest CT. However, there was significant breathing motion artifact on the prior study making it difficult to measure accurately. The associated airspace process seen on the prior CT has improved. There is a persistent moderate-sized left pleural effusion with significant overlying atelectasis. Hepatobiliary: Stable hepatic cysts and hemangiomas. No new or worrisome lesions. The gallbladder is normal. No common bile duct dilatation. Pancreas: No mass, inflammation or ductal dilatation. Spleen: Normal size. Stable small cystic area in the upper anterior aspect of the spleen. Adrenals/Urinary Tract: The adrenal glands and kidneys are unremarkable and stable. Thick-walled bladder with enhancement of the mucosa suggesting cystitis. Stomach/Bowel: The stomach, duodenum, small bowel and colon are grossly normal. No acute inflammatory changes, mass lesions or obstructive findings. Vascular/Lymphatic: The aorta and branch vessels are patent. The major venous structures are patent. Extensive mesenteric and retroperitoneal lymphadenopathy. Significant interval enlargement of a hepatic duodenum ligament lymph node on image number 28. It measures 2.8 x 2.7 cm and on the prior chest CT measured 15.5 x 11.5 mm. Celiac axis lymph node on image number 22 measures 24 x 18.5 mm and previously measured 14 x 5 mm. Several mesenteric lymph nodes are also larger when compared to the prior chest CT and PET-CT. However, several of the retroperitoneal lymph nodes are definitely smaller and there is interstitial changes around the nodes suggesting treatment changes. Several of these nodes demonstrate enhancement. The inter aortocaval lymph node on the prior study measured 23 x 18.5 mm and is no longer measurable. Reproductive: The prostate gland and seminal vesicles are unremarkable. Other: There is a small amount of free  pelvic fluid. No pelvic abscess. No free air. No inguinal mass or adenopathy. Musculoskeletal: No obvious  bone lesions. No pathologic fracture. No large disc protrusion or soft tissue mass in the spinal canal. IMPRESSION: 1. Improved aeration of the lung bases but persistent and slightly progressive pulmonary nodules. 2. Progressive periportal and mesenteric adenopathy but definite significant regression of retroperitoneal adenopathy seen on the prior PET-CT. 3. Thick-walled bladder with mucosal enhancement suspicious for cystitis. Electronically Signed   By: Marijo Sanes M.D.   On: 10/31/2017 17:14   Dg Abd 2 Views  Result Date: 10/30/2017 CLINICAL DATA:  GE reflux symptoms. Constipation. Shortness of breath on exertion. Current history of pulmonary non-Hodgkin's lymphoma for which the patient is undergoing chemotherapy. Severe thrombocytopenia and neutropenia. EXAM: ABDOMEN - 2 VIEW COMPARISON:  PET-CT 03/15/2017, 05/08/2016 and earlier. Chest x-rays yesterday and earlier including CT chest 10/16/2017. FINDINGS: Bowel gas pattern unremarkable without evidence of obstruction or significant ileus. No evidence of free air or significant air-fluid levels on the erect image. Expected stool burden in the colon. Air-fluid levels in the colon indicates liquid stool. Phleboliths in both sides of the low pelvis. No visible opaque urinary tract calculi. Nodular and airspace opacities at the lung bases and small LEFT pleural effusion noted on the ERECT image as noted on yesterday's chest x-ray. IMPRESSION: 1. No acute abdominal abnormality. 2. Nodular and airspace opacities in the lung bases and small LEFT pleural effusion as noted on yesterday's chest x-ray. Electronically Signed   By: Evangeline Dakin M.D.   On: 10/30/2017 16:19    Medications: I have reviewed the patient's current medications.  Assessment/Plan:  1. 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  PET scan 03/20/2016 with extensive bulky hypermetabolic nodal activity in the neck, chest, abdomen and pelvis; mild splenomegaly with diffusely mildly accentuated splenic activity  Staging bone marrow biopsy 03/23/2016-negative for involvement with lymphoma  Cycle 1 EPOCH beginning 03/23/2016  Cycle 2 [facility] 04/13/2016  Restaging PET scan at [facility] 05/08/2016-lymph nodes with various degrees of FDG activity in the neck, axilla, right written him, mesentery, pelvis, and groin. Indeterminate liver lesions without FDG activity, nodular mass in the posterior medial aspect of the left thigh  Cycle 3 EPOCH04/27/2018  Cycle 1 CVP 06/21/2016  Cytoxan/prednisone plus brentuximab 07/17/2016  Cytoxan/prednisone plus brentuximab 08/07/2016 (dose reduced)  Initiation of every three-week brentuximab08/08/2016  brentuximabdose reduced 10/31/2016 secondary to neuropathy  Brentuximab further dose reduced 01/03/2017 secondary to neuropathy  Presentation with parietal scalp nodular lesion 03/08/2017  Staging PET scan 07/08/735-TGGYIRS hypermetabolic lymphadenopathy in the neck, chest, abdomen/pelvis, and hypermetabolic cutaneous lesions  03/22/2017 CT biopsy left retroperitoneal nodal mass-recurrent CD30 positive T-cell lymphoma  Cycle 1 bendamustine/CPI-613on clinical trial at [facility] 04/08/2017  CTs 05/22/2017 After 2 cycles-mixed response: Enlargement of left supraclavicular node, right axillary node, right pelvic sidewall mass, decreased retroperitoneal abdominal adenopathy  Cycle 3 bendamustine/CPI-613 06/03/2017  Cycle 4 bendamustine/CPI-613 07/09/2017  CT 08/05/2017- increase in the size of a level to a lymph node, decreased size of level 3 and  supraclavicular nodes, enlargement of right axillary nodes, new bilateral pulmonary nodules, decreased mediastinal, rectal peritoneal, and iliac nodes  Cycle 5 bendamustine/CPI-6137/16/2019  Cycle 6 bendamustine/CPI-613  09/02/2017  Cycle 1 cisplatin/gemcitabine 09/30/2017 by day 8 gemcitabine held secondary to neutropenia/thrombocytopenia  Cycle 1 DHAP 10/21/2017  CT abdomen/pelvis 10/31/2017-improved aeration at the lung bases, persistent lung nodules, some are larger.  Progressive periportal and mesenteric adenopathy 2. 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 He entered clinical remission following chemotherapy and remained in remission when he was seen at the cancer center 02/24/2009. 3. 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 Staging PET scan 08/23/2010 with increased FDG activity at the left tonsillar fossa and no additional evidence of lymphoma. He completed 4 cycles of R-ICE with cycle #1 beginning on 09/19/2010 and cycle #4 on 11/21/2010. Repeat head and neck examination by Dr. Constance Holster following R-ICE/rituximab showed no residual lymphoma. He began radiation consolidation on 12/18/2010, radiation was completed on 01/22/2011. 4. History of neutropenia secondary to rituximab. 5. Anemia secondary to chemotherapy and phlebotomy 6. Thrombocytopenia secondary to chemotherapy 7. Hypertension. 8. 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 05/08/2017-nodular mass in the left thigh adjacent to the femur  MRI 06/07/2016-infiltrative mass in the left thigh adductor muscle, separate from the schwannoma excision site  MRI 10/08/2016-marked improvement in the left adductormusclemass with a small  area of enhancement remaining, no discrete mass 9. Port-A-Cath placement 03/21/2016 10. Superficial thrombus left greater saphenous vein 04/13/2016. Lovenox initiated, converted to Xarelto beginning 04/18/2016 11. 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 Admission 09/20/2017 with cough/fever/dyspnea-CT concerning for progression of lymphoma in the lungs versus pneumoniatreated with vancomycin/cefepime, started Levaquin 09/25/2017 13.  Admission 10/13/2017 with fever in the setting of severe neutropenia, no source for infection identified other than possible pneumonia 14.   Respiratory failure- most likely secondary to progression of non-Hodgkin's lymphoma in the lungs, improved 15.   Pancytopenia secondary chemotherapy 16.   Low back pain-etiology unclear, CT 10/31/2017 with no explanation for the pain,?  Cystitis       [name] is now at day 12 following DHAP.  The neutrophil count is higher today.  Hopefully the bone marrow is beginning to recover.  He appears stable from a respiratory standpoint.  We will wean nasal cannula oxygen as tolerated.  He has developed severe pain the lower back.  There is no explanation for the pain on the CT 10/31/2017, though there is thickening at the bladder wall.  We placed him on empiric ciprofloxacin.  The pain is positional, most likely not related to cystitis.  He may have developed a benign muscular skeletal condition such as a muscle strain or disc disease.  I will add oral Dilaudid today.  [name] would like to go home.  He will remain hospitalized today.  He may be able to go home tomorrow if he is stable from a respiratory standpoint and the white count is further improved.      Recommendations: 1.  Continue Decadron 2.  Continue G-CSF daily CBC/differential, can stop G-CSF after tomorrow's dose if there is a significant rise in the white count 3.  Try oral  Dilaudid for pain 4.  Begin broad-spectrum IV antibiotics for a fever 5.  Bowel regimen 6.  Continue nasal cannula oxygen, wean as tolerated 7.  Increase out of bed and ambulation as tolerated 8.  Outpatient  follow-up as scheduled at the Cancer center for 11/04/2017  Oncology will see him 11/02/2017.    LOS: 19 days   [name], MD   11/01/2017, 7:15 AM

## 2017-11-01 NOTE — Progress Notes (Signed)
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 Admitted on 9/22 with neutropenic fever, started on broad-spectrum antibiotics. Critical care consulted 9/25 for acute on chronic hypoxic respiratory failure. Pt with nonhodgkins lymphoma dx in 2018. PMh includes essential tremor, diffuse large B cell lymphoma diagnosed 2005.     PT Comments    Pt with more desats this session with ambulation. After initial 60 ft ambulation on 3LO2, pt at 83%. Required bump up to 6LO2 to recover sats. Pt ambulated 120 total with sitting rest breaks as needed. Pt educated on the importance of activity pacing, due to respiratory involvement. Pt highly motivated to improve mobility, which at times results in desats and dyspnea. Will continue to follow acutely and progress mobility as able. Pt left on 3LO2 via [place], RN aware of session and desats.     Follow Up Recommendations  Supervision for mobility/OOB;Home health PT     Equipment Recommendations  Rolling walker with 5" wheels    Recommendations for Other Services       Precautions / Restrictions Precautions Precautions: Fall Precaution Comments: on 3L O2 via [place], watch sats. Neutropenic (Pt and PT wore mask for pt care/ambulation in hall), low platelets Restrictions Weight Bearing Restrictions: No    Mobility  Bed Mobility Overal bed mobility: Needs Assistance Bed Mobility: Supine to Sit     Supine to sit: Supervision;HOB elevated     General bed mobility comments: Supervision for safety. Increased time and effort to perform.   Transfers Overall transfer level: Needs assistance Equipment used: Rolling walker (2 wheeled) Transfers: Sit to/from Stand Sit to Stand: Supervision         General transfer comment:  Supervision for safety. Pt able to stand for 2-3 minutes for IV and line management, RN re-ordering lines prior to mobilization.   Ambulation/Gait Ambulation/Gait assistance: Min guard;Supervision Gait Distance (Feet): 120 Feet Assistive device: Rolling walker (2 wheeled) Gait Pattern/deviations: Step-through pattern;Decreased stride length;Narrow base of support;Trunk flexed Gait velocity: decreased, multiple standing rest breaks    General Gait Details: Pt on 3LO2 at start of session. Pt with 92% sats at start of ambulation. Pt encouaged to take standing rest breaks as needed during ambulation. Pt ambulated approximately 60 ft before requesting a standing rest break. When stopped, Pt with O2sat of 81% on 3LO2. Bumped up to 4LO2, with recovery to mid-80s after a couple of minutes. Pt states he did not want to sit down to recover sats at that time. Pt bumped up again to [place]. Pt then chose to sit in chair in hallway, Pt recovered to 94% O2 sat after approximately 1 minute. Pt put back of 4LO2. After this, pt ambulated 1x20 and 1x40 ft, requiring seated rest breaks in between. Pt requiring bump up to 6LO2 after ambulation in hallway to recover sats from 87% to 88-92%. RN brought recliner and pt was wheeled back to room.     Stairs             Wheelchair Mobility    Modified Rankin (Stroke Patients Only)       Balance Overall balance assessment: Needs assistance Sitting-balance support: Feet supported Sitting balance-Leahy Scale: Fair Sitting balance - Comments: sits edge of chair without UE support      Standing balance-Leahy Scale: Poor Standing balance comment: relies on  RW for UE support                             Cognition Arousal/Alertness: Awake/alert Behavior During Therapy: WFL for tasks assessed/performed;Anxious Overall Cognitive Status: Within Functional Limits for tasks assessed                                        Exercises       General Comments        Pertinent Vitals/Pain Pain Assessment: Faces Faces Pain Scale: Hurts little more Pain Location: back  Pain Descriptors / Indicators: Aching;Sore Pain Intervention(s): Limited activity within patient's tolerance;Repositioned;Monitored during session    Home Living                      Prior Function            PT Goals (current goals can now be found in the care plan section) Acute Rehab PT Goals Patient Stated Goal: improve mobility  PT Goal Formulation: With patient/family Time For Goal Achievement: 11/07/17 Potential to Achieve Goals: Fair Progress towards PT goals: Progressing toward goals    Frequency    Min 3X/week      PT Plan Current plan remains appropriate    Co-evaluation              AM-PAC PT "6 Clicks" Daily Activity  Outcome Measure  Difficulty turning over in bed (including adjusting bedclothes, sheets and blankets)?: Unable Difficulty moving from lying on back to sitting on the side of the bed? : Unable Difficulty sitting down on and standing up from a chair with arms (e.g., wheelchair, bedside commode, etc,.)?: Unable Help needed moving to and from a bed to chair (including a wheelchair)?: None Help needed walking in hospital room?: A Little Help needed climbing 3-5 steps with a railing? : A Lot 6 Click Score: 12    End of Session Equipment Utilized During Treatment: Gait belt;Oxygen Activity Tolerance: Other (comment);Patient limited by fatigue(Desats, recovered with increasing O2 amount) Patient left: in chair;with call bell/phone within reach;with family/visitor present Nurse Communication: Mobility status PT Visit Diagnosis: Unsteadiness on feet (R26.81);Difficulty in walking, not elsewhere classified (R26.2)     Time: 1235-1310 PT Time Calculation (min) (ACUTE ONLY): 35 min  Charges:  $Gait Training: 23-37 mins                    [name], PT Acute Rehabilitation Services Pager [phone]   Office [phone]    [name] [name] 11/01/2017, 2:29 PM

## 2017-11-01 NOTE — Progress Notes (Signed)
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 Critical care consulted 9/25 for acute on chronic hypoxic respiratory failure. Overall, has improved with initiation of chemo. On and off Bipap, O2 requirements have improved down to 2L and he was transferred to the floor 10/10. He has been endorsing worse lower back pain for the past few days for which CT abd/pelvis was ordered by oncology consultant. His CT abd and pelvis does not show any acute pathology.   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  CT abd and pelvis shows improvement in the lungs .  Nasal oxygen as needed.  Continue with steroids.     Pancytopenia:  Resume GCSF support as per dR Sherrill.  Transfuse as needed.    T cell lymphoma:  Per oncology.    DVT:  Holding anticoagulation:    ARF; Improved.    Constipation:  Resolved.    Nausea, from chemo. Improved.    UTI:  ON CIPRO.    Back pain:  Recommend getting oob to chair.  Robaxin for muscle relaxation.    DVT prophylaxis: scd's Code Status: full code.  Family  Communication: daughter  at bedside.  Disposition Plan:  Possible d/c in one to two days if afebrile.    Consultants:   oncology   Procedures: (none.    Antimicrobials: ciprofloxacin.    Subjective: Pt reports back pain is improving.   Objective: Vitals:   11/01/17 0713 11/01/17 0813 11/01/17 0932 11/01/17 1519  BP: 112/86  109/71 109/74  Pulse: 74  99 (!) 101  Resp: 17 15  18   Temp: 98.4 F (36.9 C)   98.1 F (36.7 C)  TempSrc: Oral   Oral  SpO2: 96%   95%  Weight:      Height:        Intake/Output Summary (Last 24 hours) at 11/01/2017 1631 Last data filed at 11/01/2017 1209 Gross per 24 hour  Intake 1444.95 ml  Output 725 ml  Net 719.95 ml   Filed Weights   10/13/17 2121 10/28/17 1057 10/29/17 1047  Weight: 78.3 kg 67.2 kg 79.3 kg    Examination:  General exam: Appears calm and comfortable on 4.5 lit of OXYGEN.  Respiratory system: Diminished at bases.  Cardiovascular system: S1 & S2 heard, RRR. No JVD, murmurs, rubs, gallops or clicks. No pedal edema. Gastrointestinal system: Abdomen is nondistended, soft and nontender. No organomegaly or masses felt. Normal bowel sounds heard. Central nervous system: Alert and oriented. No focal neurological deficits. Extremities: Symmetric 5 x 5 power. Skin: No rashes, lesions or ulcers Psychiatry: Judgement and insight appear normal. Mood & affect  appropriate.     Data Reviewed: I have personally reviewed following labs and imaging studies  CBC: Recent Labs  Lab 10/27/17 0840 10/28/17 0230 10/29/17 0445 10/30/17 0619 10/30/17 1843 10/31/17 0629 11/01/17 0435  WBC 17.2* 11.7* 3.2* 0.6*  --  0.2* 0.5*  NEUTROABS 17.0* 11.6* 3.1 0.5*  --   --  0.3*  HGB 6.9* 10.1* 9.8* 9.3*  --  8.1* 7.8*  HCT 19.9* 28.8* 28.6* 28.1*  --  24.6* 23.9*  MCV 93.0 91.4 92.0 93.4  --  94.6 95.6  PLT 40* 29* 15* 6* 32* 19* 10*   Basic Metabolic Panel: Recent Labs  Lab 10/28/17 0220 10/29/17 0445 10/30/17 0619  10/31/17 0845 11/01/17 0435  NA 141 142 141 141 136  K 4.1 4.0 4.1 4.0 3.9  CL 100 100 100 99 98  CO2 29 31 28 30 26   GLUCOSE 153* 164* 146* 164* 212*  BUN 53* 45* 46* 43* 42*  CREATININE 1.06 0.92 1.15 1.25* 1.10  CALCIUM 8.7* 8.7* 8.9 8.7* 7.7*  MG 1.9  --   --   --   --    GFR: Estimated Creatinine Clearance: 71.7 mL/min (by C-G formula based on SCr of 1.1 mg/dL). Liver Function Tests: Recent Labs  Lab 10/28/17 0220 10/29/17 0445 10/30/17 1843 11/01/17 0435  AST 28 27 27 21   ALT 30 30 26 21   ALKPHOS 57 52 46 34*  BILITOT 0.7 0.9 1.1 0.8  PROT 5.5* 5.3* 5.3* 4.7*  ALBUMIN 2.4* 2.4* 2.5* 2.0*   No results for input(s): LIPASE, AMYLASE in the last 168 hours. No results for input(s): AMMONIA in the last 168 hours. Coagulation Profile: No results for input(s): INR, PROTIME in the last 168 hours. Cardiac Enzymes: No results for input(s): CKTOTAL, CKMB, CKMBINDEX, TROPONINI in the last 168 hours. BNP (last 3 results) No results for input(s): PROBNP in the last 8760 hours. HbA1C: No results for input(s): HGBA1C in the last 72 hours. CBG: Recent Labs  Lab 10/28/17 1248 10/28/17 1717 10/28/17 1937 10/29/17 0311 10/30/17 1800  GLUCAP 260* 237* 178* 166* 168*   Lipid Profile: No results for input(s): CHOL, HDL, LDLCALC, TRIG, CHOLHDL, LDLDIRECT in the last 72 hours. Thyroid Function Tests: No results for input(s): TSH, T4TOTAL, FREET4, T3FREE, THYROIDAB in the last 72 hours. Anemia Panel: No results for input(s): VITAMINB12, FOLATE, FERRITIN, TIBC, IRON, RETICCTPCT in the last 72 hours. Sepsis Labs: No results for input(s): PROCALCITON, LATICACIDVEN in the last 168 hours.  Recent Results (from the past 240 hour(s))  Urine culture     Status: None   Collection Time: 10/30/17  3:55 PM  Result Value Ref Range Status   Specimen Description   Final    URINE, CLEAN CATCH Performed at [facility], [place] [address], [place], [place] [address]     Special Requests   Final    NONE Performed at [facility], [place] [address] [place], [place] [address]    Culture   Final    NO GROWTH Performed at [facility], [place] [address] [place],  [address]    Report Status 11/01/2017 FINAL  Final         Radiology Studies: Ct Abdomen Pelvis W Contrast  Result Date: 10/31/2017 CLINICAL DATA:  Severe low back pain.  History of lymphoma. EXAM: CT ABDOMEN AND PELVIS WITH CONTRAST TECHNIQUE: Multidetector CT imaging of the abdomen and pelvis was performed using the standard protocol following bolus administration of intravenous contrast.  CONTRAST:  179mL OMNIPAQUE IOHEXOL 300 MG/ML SOLN, 34mL ISOVUE-300 IOPAMIDOL (ISOVUE-300) INJECTION 61% COMPARISON:  Chest CT 10/16/2017 FINDINGS: Lower chest: Innumerable pulmonary lesions as demonstrated previously. These are stable to slightly enlarged when compared to the prior chest CT. However, there was significant breathing motion artifact on the prior study making it difficult to measure accurately. The associated airspace process seen on the prior CT has improved. There is a persistent moderate-sized left pleural effusion with significant overlying atelectasis. Hepatobiliary: Stable hepatic cysts and hemangiomas. No new or worrisome lesions. The gallbladder is normal. No common bile duct dilatation. Pancreas: No mass, inflammation or ductal dilatation. Spleen: Normal size. Stable small cystic area in the upper anterior aspect of the spleen. Adrenals/Urinary Tract: The adrenal glands and kidneys are unremarkable and stable. Thick-walled bladder with enhancement of the mucosa suggesting cystitis. Stomach/Bowel: The stomach, duodenum, small bowel and colon are grossly normal. No acute inflammatory changes, mass lesions or obstructive findings. Vascular/Lymphatic: The aorta and branch vessels are patent. The major venous structures are patent. Extensive mesenteric and retroperitoneal  lymphadenopathy. Significant interval enlargement of a hepatic duodenum ligament lymph node on image number 28. It measures 2.8 x 2.7 cm and on the prior chest CT measured 15.5 x 11.5 mm. Celiac axis lymph node on image number 22 measures 24 x 18.5 mm and previously measured 14 x 5 mm. Several mesenteric lymph nodes are also larger when compared to the prior chest CT and PET-CT. However, several of the retroperitoneal lymph nodes are definitely smaller and there is interstitial changes around the nodes suggesting treatment changes. Several of these nodes demonstrate enhancement. The inter aortocaval lymph node on the prior study measured 23 x 18.5 mm and is no longer measurable. Reproductive: The prostate gland and seminal vesicles are unremarkable. Other: There is a small amount of free pelvic fluid. No pelvic abscess. No free air. No inguinal mass or adenopathy. Musculoskeletal: No obvious bone lesions. No pathologic fracture. No large disc protrusion or soft tissue mass in the spinal canal. IMPRESSION: 1. Improved aeration of the lung bases but persistent and slightly progressive pulmonary nodules. 2. Progressive periportal and mesenteric adenopathy but definite significant regression of retroperitoneal adenopathy seen on the prior PET-CT. 3. Thick-walled bladder with mucosal enhancement suspicious for cystitis. Electronically Signed   By: Marijo Sanes M.D.   On: 10/31/2017 17:14        Scheduled Meds: . sodium chloride   Intravenous Once  . atenolol  50 mg Oral Daily  . Chlorhexidine Gluconate Cloth  6 each Topical Q0600  . ciprofloxacin  500 mg Oral BID  . feeding supplement  1 Container Oral TID BM  . loratadine  10 mg Oral Daily  . multivitamin  1 tablet Oral Daily  . pantoprazole  40 mg Oral Daily  . polyethylene glycol  17 g Oral Daily  . senna-docusate  1 tablet Oral BID  . Tbo-Filgrastim  480 mcg Subcutaneous Daily  . thiamine  100 mg Oral Daily   Continuous Infusions: . sodium  chloride 75 mL/hr (11/01/17 1107)     LOS: 19 days    Time spent: 35 min   Hosie Poisson, MD [facility] Pager [phone]  If 7PM-7AM, please contact night-coverage [email or web address] Password [email or web address] 11/01/2017, 4:31 PM

## 2017-11-02 DIAGNOSIS — D61810 Antineoplastic chemotherapy induced pancytopenia: Principal | ICD-10-CM

## 2017-11-02 LAB — CBC
HCT: 23.4 % — ABNORMAL LOW (ref 39.0–52.0)
HEMOGLOBIN: 7.8 g/dL — AB (ref 13.0–17.0)
MCH: 31.3 pg (ref 26.0–34.0)
MCHC: 33.3 g/dL (ref 30.0–36.0)
MCV: 94 fL (ref 80.0–100.0)
NRBC: 0.7 % — AB (ref 0.0–0.2)
PLATELETS: 33 10*3/uL — AB (ref 150–400)
RBC: 2.49 MIL/uL — AB (ref 4.22–5.81)
RDW: 13.8 % (ref 11.5–15.5)
WBC: 7.5 10*3/uL (ref 4.0–10.5)

## 2017-11-02 LAB — CBC WITH DIFFERENTIAL/PLATELET
ABS IMMATURE GRANULOCYTES: 0.76 10*3/uL — AB (ref 0.00–0.07)
BASOS PCT: 0 %
Basophils Absolute: 0 10*3/uL (ref 0.0–0.1)
Eosinophils Absolute: 0 10*3/uL (ref 0.0–0.5)
Eosinophils Relative: 0 %
HCT: 26.8 % — ABNORMAL LOW (ref 39.0–52.0)
HEMOGLOBIN: 8.9 g/dL — AB (ref 13.0–17.0)
Immature Granulocytes: 16 %
LYMPHS PCT: 1 %
Lymphs Abs: 0.1 10*3/uL — ABNORMAL LOW (ref 0.7–4.0)
MCH: 31.2 pg (ref 26.0–34.0)
MCHC: 33.2 g/dL (ref 30.0–36.0)
MCV: 94 fL (ref 80.0–100.0)
MONO ABS: 0.6 10*3/uL (ref 0.1–1.0)
Monocytes Relative: 12 %
NEUTROS ABS: 3.3 10*3/uL (ref 1.7–7.7)
Neutrophils Relative %: 71 %
Platelets: 6 10*3/uL — CL (ref 150–400)
RBC: 2.85 MIL/uL — ABNORMAL LOW (ref 4.22–5.81)
RDW: 13.8 % (ref 11.5–15.5)
WBC: 4.7 10*3/uL (ref 4.0–10.5)
nRBC: 0.9 % — ABNORMAL HIGH (ref 0.0–0.2)

## 2017-11-02 MED ORDER — LACTULOSE 10 GM/15ML PO SOLN: 30.0000 g | Freq: Every day | ORAL | 0 refills | Status: AC | PRN

## 2017-11-02 MED ORDER — SODIUM CHLORIDE 0.9% IV SOLUTION: Freq: Once | INTRAVENOUS | Status: DC

## 2017-11-02 MED ORDER — BOOST PLUS PO LIQD: 237.0000 mL | Freq: Every day | ORAL | 0 refills | Status: AC | PRN

## 2017-11-02 MED ORDER — SENNOSIDES-DOCUSATE SODIUM 8.6-50 MG PO TABS: 1.0000 | ORAL_TABLET | Freq: Two times a day (BID) | ORAL | 0 refills | Status: AC

## 2017-11-02 MED ORDER — PANTOPRAZOLE SODIUM 40 MG PO TBEC: 40.0000 mg | DELAYED_RELEASE_TABLET | Freq: Every day | ORAL | 0 refills | Status: AC

## 2017-11-02 MED ORDER — POLYETHYLENE GLYCOL 3350 17 G PO PACK: 17.0000 g | PACK | Freq: Every day | ORAL | 0 refills | Status: AC

## 2017-11-02 MED ORDER — DEXAMETHASONE 4 MG PO TABS: ORAL_TABLET | ORAL | 0 refills | Status: DC

## 2017-11-02 MED ORDER — BISACODYL 5 MG PO TBEC: 5.0000 mg | DELAYED_RELEASE_TABLET | Freq: Every day | ORAL | 0 refills | Status: AC | PRN

## 2017-11-02 MED ORDER — ACETAMINOPHEN 325 MG PO TABS
650.0000 mg | ORAL_TABLET | Freq: Four times a day (QID) | ORAL | Status: DC | PRN
  Administered 2017-11-02: 650 mg via ORAL
  Filled 2017-11-02: qty 2

## 2017-11-02 MED ORDER — DEXAMETHASONE 4 MG PO TABS: 20.0000 mg | ORAL_TABLET | Freq: Every day | ORAL | Status: DC

## 2017-11-02 MED ORDER — DEXAMETHASONE 4 MG PO TABS
4.0000 mg | ORAL_TABLET | Freq: Two times a day (BID) | ORAL | Status: DC
  Administered 2017-11-02: 4 mg via ORAL
  Filled 2017-11-02: qty 1

## 2017-11-02 MED ORDER — METHOCARBAMOL 500 MG PO TABS: 500.0000 mg | ORAL_TABLET | Freq: Four times a day (QID) | ORAL | 0 refills | Status: AC | PRN

## 2017-11-02 MED ORDER — CIPROFLOXACIN HCL 500 MG PO TABS: 500.0000 mg | ORAL_TABLET | Freq: Two times a day (BID) | ORAL | 0 refills | Status: AC

## 2017-11-02 MED ORDER — HEPARIN SOD (PORK) LOCK FLUSH 100 UNIT/ML IV SOLN
500.0000 [IU] | Freq: Once | INTRAVENOUS | Status: AC
  Administered 2017-11-02: 500 [IU] via INTRAVENOUS
  Filled 2017-11-02: qty 5

## 2017-11-02 MED ORDER — HYDROMORPHONE HCL 2 MG PO TABS: 2.0000 mg | ORAL_TABLET | ORAL | 0 refills | Status: DC | PRN

## 2017-11-02 MED ORDER — PROSIGHT PO TABS: 1.0000 | ORAL_TABLET | Freq: Every day | ORAL | 0 refills | Status: AC

## 2017-11-02 MED ORDER — THIAMINE HCL 100 MG PO TABS: 100.0000 mg | ORAL_TABLET | Freq: Every day | ORAL | 0 refills | Status: AC

## 2017-11-02 NOTE — Progress Notes (Addendum)
[name]   DOB:21-Jan-1961   JJ#:[id number]    Assessment & Plan:  1. 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  PET scan 03/20/2016 with extensive bulky hypermetabolic nodal activity in the neck, chest, abdomen and pelvis; mild splenomegaly with diffusely mildly accentuated splenic activity  Staging bone marrow biopsy 03/23/2016-negative for involvement with lymphoma  Cycle 1 EPOCH beginning 03/23/2016  Cycle 2 [facility] 04/13/2016  Restaging PET scan at [facility] 05/08/2016-lymph nodes with various degrees of FDG activity in the neck, axilla, right written him, mesentery, pelvis, and groin. Indeterminate liver lesions without FDG activity, nodular mass in the posterior medial aspect of the left thigh  Cycle 3 EPOCH04/27/2018  Cycle 1 CVP 06/21/2016  Cytoxan/prednisone plus brentuximab 07/17/2016  Cytoxan/prednisone plus brentuximab 08/07/2016 (dose reduced)  Initiation of every three-week brentuximab08/08/2016  brentuximabdose reduced 10/31/2016 secondary to neuropathy  Brentuximab further dose reduced 01/03/2017 secondary to neuropathy  Presentation with parietal scalp nodular lesion 03/08/2017  Staging PET scan 4/81/8563-[id number] hypermetabolic lymphadenopathy in the neck, chest, abdomen/pelvis, and hypermetabolic cutaneous lesions  03/22/2017 CT biopsy left retroperitoneal nodal mass-recurrent CD30 positive T-cell lymphoma  Cycle 1 bendamustine/CPI-613on clinical trial at [facility] 04/08/2017  CTs 05/22/2017 After 2 cycles-mixed response: Enlargement of left  supraclavicular node, right axillary node, right pelvic sidewall mass, decreased retroperitoneal abdominal adenopathy  Cycle 3 bendamustine/CPI-613 06/03/2017  Cycle 4 bendamustine/CPI-613 07/09/2017  CT 08/05/2017- increase in the size of a level to a lymph node, decreased size of level 3 and supraclavicular nodes, enlargement of right axillary nodes, new bilateral pulmonary nodules, decreased mediastinal, rectal peritoneal, and iliac nodes  Cycle 5 bendamustine/CPI-6137/16/2019  Cycle 6 bendamustine/CPI-613 09/02/2017  Cycle 1 cisplatin/gemcitabine 09/30/2017 by day 8 gemcitabine held secondary to neutropenia/thrombocytopenia  Cycle 1 DHAP 10/21/2017  CT abdomen/pelvis 10/31/2017-improved aeration at the lung bases, persistent lung nodules, some are larger.  Progressive periportal and mesenteric adenopathy 2. 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 He entered clinical remission following chemotherapy and remained in remission when he was seen at the cancer center 02/24/2009. 3. 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 Staging PET scan 08/23/2010 with increased FDG activity at the left tonsillar fossa and no additional evidence of lymphoma. He completed 4 cycles of R-ICE with cycle #1 beginning on 09/19/2010 and cycle #4 on 11/21/2010. Repeat head and neck examination by Dr. Constance Holster following R-ICE/rituximab showed no residual lymphoma. He began radiation consolidation on 12/18/2010, radiation was completed on 01/22/2011. 4. History of neutropenia secondary to rituximab. 5. Anemia secondary to chemotherapy and phlebotomy 6. Thrombocytopenia secondary to chemotherapy 7. Hypertension. 8. 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 05/08/2017-nodular mass in the left thigh adjacent to the femur  MRI 06/07/2016-infiltrative mass in the left thigh adductor muscle, separate from the schwannoma excision site  MRI 10/08/2016-marked improvement in the left adductormusclemass with a small area of enhancement remaining, no discrete mass 9. Port-A-Cath placement 03/21/2016 10. Superficial thrombus left greater saphenous vein  04/13/2016. Lovenox initiated, converted to Xarelto beginning 04/18/2016 11. 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 Admission 09/20/2017 with cough/fever/dyspnea-CT concerning for progression of lymphoma in the lungs versus pneumoniatreated with vancomycin/cefepime, started Levaquin 09/25/2017 13.  Admission 10/13/2017 with fever in the setting of severe neutropenia, no source for infection identified other than possible pneumonia 14.   Respiratory failure- most likely secondary to progression of non-Hodgkin's lymphoma in the lungs, improved 15.   Pancytopenia secondary chemotherapy 16.   Low back pain-etiology unclear, CT 10/31/2017 with no explanation for the pain,?  Cystitis   [name] is now at day 13 following DHAP.  WBC 4.7k with ANC of 3300, okay to discontinue Granix.   His platelet count is 6k today without any symptoms of bleeding or excess bruising. He is getting 1 unti plt transfusion today.   He appears stable from a respiratory standpoint.  He has been set up for home oxygen.   His back pain is improved with PRN muscle relaxant and Dilaudid. He is being treated with cipro for possible cystitis.   With improvement in the WBC and ANC, and overall stable respiratory status, he is okay to be discharged from oncology standpoint.     Recommendations: 1.  Continue Decadron 2.  Discontinue Granix, given resolution of leukopenia and neutropenia  3.  Agree with 1 unit  platelet transfusion in anticipation of discharge today 3.  Agree with cipro at discharge from possible cystitis  4.  Encourage ambulation  5.  Okay to discharge from oncology standpoint. Pt has outpatient follow-up as scheduled at the Cancer center for 11/04/2017  LOS: 20 days   Tish Men, MD 11/02/2017  11:16 AM  Subjective:  "My back pain is better"  Objective:  [name] that his breathing is overall stable, and he can walk to the bathroom and back to the bed without significant worsening of his dyspnea, but his oxygen saturation would drop to the mid-80's. His back pain is much better today with the muscle relaxant and PRN Dilaudid. He denies any fever, chill, chest pain, abdominal pain, N/V, diarrhea, hematuria, dysuria, hematochezia or melena.   Constitutional: ( - ) fevers, ( - )  chills , ( - ) night sweats Ears, nose, mouth, throat, and face: ( - ) mucositis, ( - ) sore throat Respiratory: ( - ) cough, ( + ) stable dyspnea, ( - ) wheezes Cardiovascular: ( - ) palpitation, ( - ) chest discomfort, ( + ) lower extremity swelling Gastrointestinal:  ( - ) nausea, ( - ) heartburn, ( - ) change in bowel habits Skin: ( - ) abnormal skin rashes Lymphatics: ( - ) new lymphadenopathy, ( - ) easy bruising Neurological: ( - ) numbness, ( - ) tingling, ( - ) new weaknesses Behavioral/Psych: ( - ) mood change, ( - ) new changes  All other systems were reviewed with the patient and are negative.   Vitals:   11/01/17 2052 11/02/17 0413  BP: 112/72 114/87  Pulse: 98 90  Resp: 16 14  Temp: 98.2 F (36.8 C) 99.6 F (37.6 C)  SpO2: 96% 97%     Intake/Output Summary (Last 24 hours) at 11/02/2017 1116 Last data filed at 11/02/2017 1038 Gross per 24 hour  Intake 595 ml  Output 700 ml  Net -105 ml    GENERAL: alert, no distress and comfortable, slightly frail-appearing  SKIN: skin color, texture, turgor are normal, no rashes or significant lesions EYES:  conjunctiva are pink  and non-injected, sclera clear OROPHARYNX: no exudate, no erythema; lips, buccal mucosa, and tongue normal  NECK: supple, non-tender LYMPH:  no palpable lymphadenopathy in the cervical, axillary or inguinal LUNGS: mild shallow breathing, slightly decreased air movement bilaterally, no coarse rhonchi, rales or crakcles HEART: regular rhythm, mild borderline tachycardia, no murmurs, 1+ bilateral lower extremity edema ABDOMEN: soft, non-tender, non-distended, normal bowel sounds Musculoskeletal: no cyanosis of digits and no clubbing  PSYCH: alert & oriented x 3, fluent speech NEURO: no focal motor/sensory deficits   Labs:  Lab Results  Component Value Date   WBC 4.7 11/02/2017   HGB 8.9 (L) 11/02/2017   HCT 26.8 (L) 11/02/2017   MCV 94.0 11/02/2017   PLT 6 (LL) 11/02/2017   NEUTROABS 3.3 11/02/2017    Lab Results  Component Value Date   NA 136 11/01/2017   K 3.9 11/01/2017   CL 98 11/01/2017   CO2 26 11/01/2017    Studies:  Ct Abdomen Pelvis W Contrast  Result Date: 10/31/2017 CLINICAL DATA:  Severe low back pain.  History of lymphoma. EXAM: CT ABDOMEN AND PELVIS WITH CONTRAST TECHNIQUE: Multidetector CT imaging of the abdomen and pelvis was performed using the standard protocol following bolus administration of intravenous contrast. CONTRAST:  112m OMNIPAQUE IOHEXOL 300 MG/ML SOLN, 388mISOVUE-300 IOPAMIDOL (ISOVUE-300) INJECTION 61% COMPARISON:  Chest CT 10/16/2017 FINDINGS: Lower chest: Innumerable pulmonary lesions as demonstrated previously. These are stable to slightly enlarged when compared to the prior chest CT. However, there was significant breathing motion artifact on the prior study making it difficult to measure accurately. The associated airspace process seen on the prior CT has improved. There is a persistent moderate-sized left pleural effusion with significant overlying atelectasis. Hepatobiliary: Stable hepatic cysts and hemangiomas. No new or worrisome lesions. The  gallbladder is normal. No common bile duct dilatation. Pancreas: No mass, inflammation or ductal dilatation. Spleen: Normal size. Stable small cystic area in the upper anterior aspect of the spleen. Adrenals/Urinary Tract: The adrenal glands and kidneys are unremarkable and stable. Thick-walled bladder with enhancement of the mucosa suggesting cystitis. Stomach/Bowel: The stomach, duodenum, small bowel and colon are grossly normal. No acute inflammatory changes, mass lesions or obstructive findings. Vascular/Lymphatic: The aorta and branch vessels are patent. The major venous structures are patent. Extensive mesenteric and retroperitoneal lymphadenopathy. Significant interval enlargement of a hepatic duodenum ligament lymph node on image number 28. It measures 2.8 x 2.7 cm and on the prior chest CT measured 15.5 x 11.5 mm. Celiac axis lymph node on image number 22 measures 24 x 18.5 mm and previously measured 14 x 5 mm. Several mesenteric lymph nodes are also larger when compared to the prior chest CT and PET-CT. However, several of the retroperitoneal lymph nodes are definitely smaller and there is interstitial changes around the nodes suggesting treatment changes. Several of these nodes demonstrate enhancement. The inter aortocaval lymph node on the prior study measured 23 x 18.5 mm and is no longer measurable. Reproductive: The prostate gland and seminal vesicles are unremarkable. Other: There is a small amount of free pelvic fluid. No pelvic abscess. No free air. No inguinal mass or adenopathy. Musculoskeletal: No obvious bone lesions. No pathologic fracture. No large disc protrusion or soft tissue mass in the spinal canal. IMPRESSION: 1. Improved aeration of the lung bases but persistent and slightly progressive pulmonary nodules. 2. Progressive periportal and mesenteric adenopathy but definite significant regression of retroperitoneal adenopathy seen on the prior PET-CT. 3. Thick-walled bladder with mucosal  enhancement suspicious for cystitis. Electronically Signed   By: Marijo Sanes M.D.   On: 10/31/2017 17:14

## 2017-11-02 NOTE — Progress Notes (Signed)
Patient discharged to home with family, discharge instructions reviewed with patient and wife who verbalized understanding. New RX given to wife.

## 2017-11-04 ENCOUNTER — Inpatient Hospital Stay (HOSPITAL_BASED_OUTPATIENT_CLINIC_OR_DEPARTMENT_OTHER): Payer: 59 | Admitting: Nurse Practitioner

## 2017-11-04 ENCOUNTER — Inpatient Hospital Stay: Payer: 59

## 2017-11-04 ENCOUNTER — Other Ambulatory Visit: Payer: Self-pay | Admitting: Emergency Medicine

## 2017-11-04 ENCOUNTER — Encounter: Payer: Self-pay | Admitting: Nurse Practitioner

## 2017-11-04 ENCOUNTER — Telehealth: Payer: Self-pay | Admitting: Nurse Practitioner

## 2017-11-04 VITALS — BP 114/88 | HR 85 | Temp 98.0°F | Resp 20 | Ht 68.0 in

## 2017-11-04 DIAGNOSIS — R0602 Shortness of breath: Secondary | ICD-10-CM

## 2017-11-04 DIAGNOSIS — C8448 Peripheral T-cell lymphoma, not classified, lymph nodes of multiple sites: Secondary | ICD-10-CM | POA: Diagnosis present

## 2017-11-04 DIAGNOSIS — Z923 Personal history of irradiation: Secondary | ICD-10-CM | POA: Diagnosis not present

## 2017-11-04 DIAGNOSIS — D6959 Other secondary thrombocytopenia: Secondary | ICD-10-CM

## 2017-11-04 DIAGNOSIS — I10 Essential (primary) hypertension: Secondary | ICD-10-CM

## 2017-11-04 DIAGNOSIS — C8590 Non-Hodgkin lymphoma, unspecified, unspecified site: Secondary | ICD-10-CM

## 2017-11-04 DIAGNOSIS — Z9221 Personal history of antineoplastic chemotherapy: Secondary | ICD-10-CM | POA: Diagnosis not present

## 2017-11-04 DIAGNOSIS — Z9981 Dependence on supplemental oxygen: Secondary | ICD-10-CM | POA: Diagnosis not present

## 2017-11-04 DIAGNOSIS — Z95828 Presence of other vascular implants and grafts: Secondary | ICD-10-CM

## 2017-11-04 LAB — CBC WITH DIFFERENTIAL (CANCER CENTER ONLY)
Abs Immature Granulocytes: 3.36 10*3/uL — ABNORMAL HIGH (ref 0.00–0.07)
BASOS PCT: 0 %
Basophils Absolute: 0.1 10*3/uL (ref 0.0–0.1)
EOS ABS: 0 10*3/uL (ref 0.0–0.5)
EOS PCT: 0 %
HEMATOCRIT: 26.3 % — AB (ref 39.0–52.0)
Hemoglobin: 9 g/dL — ABNORMAL LOW (ref 13.0–17.0)
Immature Granulocytes: 10 %
LYMPHS ABS: 0.5 10*3/uL — AB (ref 0.7–4.0)
Lymphocytes Relative: 1 %
MCH: 31.3 pg (ref 26.0–34.0)
MCHC: 34.2 g/dL (ref 30.0–36.0)
MCV: 91.3 fL (ref 80.0–100.0)
MONO ABS: 1.3 10*3/uL — AB (ref 0.1–1.0)
MONOS PCT: 4 %
NEUTROS PCT: 85 %
Neutro Abs: 29.5 10*3/uL — ABNORMAL HIGH (ref 1.7–7.7)
PLATELETS: 25 10*3/uL — AB (ref 150–400)
RBC: 2.88 MIL/uL — ABNORMAL LOW (ref 4.22–5.81)
RDW: 14.2 % (ref 11.5–15.5)
WBC Count: 34.8 10*3/uL — ABNORMAL HIGH (ref 4.0–10.5)
nRBC: 1 % — ABNORMAL HIGH (ref 0.0–0.2)

## 2017-11-04 LAB — BPAM PLATELET PHERESIS
BLOOD PRODUCT EXPIRATION DATE: 201910132359
ISSUE DATE / TIME: 201910121215
UNIT TYPE AND RH: 6200

## 2017-11-04 LAB — PREPARE PLATELET PHERESIS: UNIT DIVISION: 0

## 2017-11-04 LAB — SAMPLE TO BLOOD BANK

## 2017-11-04 MED ORDER — HYDROMORPHONE HCL 2 MG/ML IJ SOLN
INTRAMUSCULAR | Status: AC
  Filled 2017-11-04: qty 1

## 2017-11-04 MED ORDER — DEXAMETHASONE SODIUM PHOSPHATE 4 MG/ML IJ SOLN: 10.0000 mg | Freq: Once | INTRAMUSCULAR | 0 refills | Status: AC

## 2017-11-04 MED ORDER — DEXAMETHASONE SODIUM PHOSPHATE 10 MG/ML IJ SOLN
INTRAMUSCULAR | Status: AC
  Filled 2017-11-04: qty 1

## 2017-11-04 MED ORDER — SODIUM CHLORIDE 0.9% FLUSH
10.0000 mL | Freq: Once | INTRAVENOUS | Status: AC
  Administered 2017-11-04: 10 mL
  Filled 2017-11-04: qty 10

## 2017-11-04 MED ORDER — HYDROMORPHONE HCL 1 MG/ML IJ SOLN: 1.0000 mg | INTRAMUSCULAR | Status: DC

## 2017-11-04 MED ORDER — HYDROMORPHONE HCL 1 MG/ML IJ SOLN
1.0000 mg | INTRAMUSCULAR | Status: AC
  Administered 2017-11-04: 1 mg via INTRAVENOUS
  Filled 2017-11-04: qty 1

## 2017-11-04 MED ORDER — DEXAMETHASONE 4 MG PO TABS: ORAL_TABLET | ORAL | 0 refills | Status: DC

## 2017-11-04 MED ORDER — SODIUM CHLORIDE 0.9 % IV SOLN: 10.0000 mg | Freq: Once | INTRAVENOUS | Status: DC

## 2017-11-04 MED ORDER — DEXAMETHASONE SODIUM PHOSPHATE 10 MG/ML IJ SOLN
10.0000 mg | Freq: Once | INTRAMUSCULAR | Status: AC
  Administered 2017-11-04: 10 mg via INTRAVENOUS

## 2017-11-04 MED FILL — DEXAMETHASONE 10 MG/ML VIAL: 10 | 1 days supply | Qty: 1 | Fill #0

## 2017-11-04 MED FILL — DEXAMETHASONE 4 MG TABLET: 4 | 1 days supply | Qty: 3 | Fill #0

## 2017-11-04 NOTE — Telephone Encounter (Signed)
No 10/14 los.

## 2017-11-04 NOTE — Progress Notes (Addendum)
[facility] OFFICE PROGRESS NOTE   Diagnosis: T-cell lymphoma  INTERVAL HISTORY:   Mr. Eslick returns as scheduled.  He was hospitalized 10/13/2017 through 11/02/2017 with respiratory failure most likely secondary to progression of non-Hodgkin's lymphoma in the lungs.  He completed cycle 1 DHAP beginning 10/21/2017.  Symptoms subsequently improved.  He has increased shortness of breath.  He is currently on oxygen at 10 L with a nonrebreather.  Last night temperature increased to 99.8.  He took Tylenol and has had no further fever.  While straining for a bowel movement yesterday he noted blood.  Back pain is unchanged.  His wife notes that his abdomen is distended.  Objective:  Vital signs in last 24 hours:  Blood pressure 114/88, pulse 85, temperature 98 F (36.7 C), temperature source Oral, resp. rate 20, height '5\' 8"'$  (1.727 m), SpO2 90 %.    HEENT: No thrush or ulcers. Lymphatics: 2-3 cm right axillary lymph node. Resp: Lungs clear bilaterally.  Increased respiratory rate. Cardio: Regular rate and rhythm. GI: Abdomen is distended.  Generalized tenderness.  No apparent ascites. Vascular: Pitting edema below the knees bilaterally. Neuro: He seems mildly confused. Port-A-Cath without erythema.   Lab Results:  Lab Results  Component Value Date   WBC 34.8 (H) 11/04/2017   HGB 9.0 (L) 11/04/2017   HCT 26.3 (L) 11/04/2017   MCV 91.3 11/04/2017   PLT 25 (L) 11/04/2017   NEUTROABS 29.5 (H) 11/04/2017    Imaging:  No results found.  Medications: I have reviewed the patient's current medications.  Assessment/Plan: 1. 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  PET scan 03/20/2016 with extensive bulky hypermetabolic nodal activity in the neck, chest, abdomen and pelvis; mild splenomegaly with diffusely mildly accentuated splenic activity  Staging bone marrow biopsy 03/23/2016-negative for involvement with lymphoma  Cycle 1 EPOCH beginning 03/23/2016  Cycle 2 [facility] 04/13/2016  Restaging PET scan at [facility] 05/08/2016-lymph nodes with various degrees of FDG activity in the neck, axilla, right written him, mesentery, pelvis, and groin. Indeterminate liver lesions without FDG activity, nodular mass in the posterior medial aspect of the left thigh  Cycle 3 EPOCH04/27/2018  Cycle 1 CVP 06/21/2016  Cytoxan/prednisone plus brentuximab 07/17/2016  Cytoxan/prednisone plus brentuximab 08/07/2016 (dose reduced)  Initiation of every three-week brentuximab08/08/2016  brentuximabdose reduced 10/31/2016 secondary to neuropathy  Brentuximab further dose reduced 01/03/2017 secondary to neuropathy  Presentation with parietal scalp nodular lesion 03/08/2017  Staging PET scan 04/19/9240-[id number] hypermetabolic lymphadenopathy in the neck, chest, abdomen/pelvis, and hypermetabolic cutaneous lesions  03/22/2017 CT biopsy left retroperitoneal nodal mass-recurrent CD30 positive T-cell lymphoma  Cycle 1 bendamustine/CPI-613on clinical trial at [facility] 04/08/2017  CTs 05/22/2017 After 2 cycles-mixed response: Enlargement of left supraclavicular node, right axillary node, right pelvic sidewall mass, decreased retroperitoneal abdominal adenopathy  Cycle 3 bendamustine/CPI-613 06/03/2017  Cycle 4 bendamustine/CPI-613 07/09/2017  CT 08/05/2017- increase in the size of a level to a lymph node, decreased size of level 3 and supraclavicular nodes, enlargement of right axillary nodes, new bilateral pulmonary nodules, decreased mediastinal, rectal peritoneal, and  iliac nodes  Cycle 5 bendamustine/CPI-6137/16/2019  Cycle 6 bendamustine/CPI-613 09/02/2017  Cycle 1 cisplatin/gemcitabine 9/9/2019by day 8 gemcitabine held secondary to  neutropenia/thrombocytopenia  Cycle 1 DHAP 10/21/2017  CT abdomen/pelvis 10/31/2017-improved aeration at the lung bases, persistent lung nodules, some are larger.  Progressive periportal and mesenteric adenopathy 2. 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 He entered clinical remission following chemotherapy and remained in remission when he was seen at the cancer center 02/24/2009. 3. 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 Staging PET scan 08/23/2010 with increased FDG activity at the left tonsillar fossa and no additional evidence of lymphoma. He completed 4 cycles of R-ICE with cycle #1 beginning on 09/19/2010 and cycle #4 on 11/21/2010. Repeat head and neck examination by Dr. Constance Holster following R-ICE/rituximab showed no residual lymphoma. He began radiation consolidation on 12/18/2010, radiation was completed on 01/22/2011. 4. History of neutropenia secondary to rituximab. 5. Anemia secondary to chemotherapy and phlebotomy 6. Thrombocytopenia secondary to chemotherapy 7. Hypertension. 8. 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 05/08/2017-nodular mass in the left thigh adjacent to the femur  MRI 06/07/2016-infiltrative mass in the left thigh adductor muscle, separate from the schwannoma excision site  MRI 10/08/2016-marked improvement in the left adductormusclemass with a small area of enhancement remaining, no discrete mass 9. Port-A-Cath placement 03/21/2016 10. Superficial thrombus left greater saphenous vein  04/13/2016. Lovenox initiated, converted to Xarelto beginning 04/18/2016 11. 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 Admission 09/20/2017 with cough/fever/dyspnea-CT concerning for progression of lymphoma in the lungs versus pneumoniatreated with vancomycin/cefepime, started Levaquin 09/25/2017 13.  Admission 10/13/2017 with fever in the setting of severe neutropenia, no source for infection identified other than possible pneumonia 14.   Respiratory failure- most likely secondary to progression of non-Hodgkin's lymphoma in the lungs, improved 15.   Pancytopenia secondary chemotherapy 16.   Low back pain-etiology unclear, CT 10/31/2017 with no explanation for the pain,?  Cystitis  Disposition: Mr. Adinolfi was discharged from the hospital 11/02/2017.  He presents today for follow-up.  His respiratory status has deteriorated since discharge.  He and his family understand this is most likely due to progression of lymphoma in the lungs.  He has decided that he does not want to be readmitted to the hospital.  He would like to go home with hospice support.  We are contacting the [facility] hospice program to make those arrangements.  Dr. [name] discussed CPR/end-of-life issues.  Per Mr. Burright wishes he will be placed on NO CODE BLUE status.  In the past his respiratory status has improved with IV steroids.  He will receive dexamethasone 10 mg IV in the office today.  We are trying to make arrangements to continue dexamethasone 10 mg IV twice daily at home.  We will schedule follow-up pending his clinical status over the next several days.  Patient seen with Dr. [name].  40 minutes were spent face-to-face at today's visit with the majority of that time involved in counseling/coordination of care.  [name] ANP/GNP-BC   11/04/2017  12:04 PM  This was a shared visit with [name].  Mr. Pickering was interviewed and  examined.  He has experienced a market decline in his respiratory status since discharge from the hospital over the weekend.  I suspect this is related to lymphoma progression in the chest.  We discussed hospital admission for supportive care  and further evaluation.  The respiratory failure has responded to high-dose steroids during previous hospital admissions.  Mr. Shughart indicated he does not wish to go into the hospital.  He understands he may not survive beyond the next few days at home.  He agrees to a [facility] referral.  We discussed CPR and ACLS issues.  He will be placed on a no CODE BLUE status.  His wife and daughter were present for these discussions.  I contacted the [facility] hospice program.  He will be enrolled in hospice care today.  He will continue home oxygen therapy.  We added IV Decadron for comfort.  Julieanne Manson, MD

## 2017-11-04 NOTE — Discharge Summary (Signed)
Physician Discharge Summary  [name] INO:[id number] DOB: 1960/11/29 DOA: 10/13/2017  PCP: [name], MD  Admit date: 10/13/2017 Discharge date: 11/02/2017  Admitted From:Home.  Disposition:  Home.  Recommendations for Outpatient Follow-up:  1. Follow up with PCP in 1-2 weeks 2. Please obtain BMP/CBC in one week Please follow up oncology on Monday Morning as scheduled.   Home Health:yes  Equipment/Devices: 4 to 5 lit of [place] oxygen   Discharge Condition:stable. CODE STATUS: full code.  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 Critical care consulted 9/25 for acute on chronic hypoxic respiratory failure. Overall, has improved with initiation of chemo. On and off Bipap, O2 requirements have improveddown to 2L and he was transferred to the floor 10/10. He has been endorsing worse lower back pain for the past few days for which CT abd/pelvis was ordered by oncology consultant.His CT abd and pelvis does not show any acute pathology.    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  CT abd and pelvis shows improvement in the lungs .  Nasal oxygen as needed.   Continue with steroids as per oncology recommendations.    Pancytopenia:   1 more unit of platelets transfused before discharge.    T cell lymphoma:  Per oncology.    DVT:  Holding anticoagulation:    ARF; Improved.    Constipation:  Resolved.    Nausea, from chemo. Improved.    UTI:  Discharged on 3 more days of ciprofloxacin.     Back pain:  Recommend getting oob to chair.  Robaxin for muscle relaxation.  Improved.   Discharge Instructions  Discharge Instructions    Diet - low sodium heart healthy   Complete by:  As directed    Discharge instructions   Complete by:  As directed    Follow up with Dr [name] as recommended on Monday.     Allergies as of 11/02/2017      Reactions   Penicillins Other (See Comments)   Childhood allergy reaction unknown  Has patient had a PCN reaction causing immediate rash, facial/tongue/throat swelling, SOB or lightheadedness with hypotension: Unknown Has patient had a PCN reaction causing severe rash involving mucus membranes or skin necrosis: Unknown Has patient had a PCN reaction that required hospitalization: Unknown Has patient had a PCN reaction occurring within the last 10 years: Unknown If all of the above answers are "NO", then may proceed with Cephalosporin use.      Medication List    STOP taking these medications   chlorpheniramine-HYDROcodone 10-8 MG/5ML Suer Commonly known as:  TUSSIONEX   clotrimazole 10 MG troche Commonly known as:  MYCELEX   levofloxacin 500 MG tablet Commonly known as:  LEVAQUIN   predniSONE  20 MG tablet Commonly known as:  DELTASONE   prochlorperazine 10 MG tablet Commonly known as:  COMPAZINE   XARELTO 20 MG Tabs tablet Generic drug:  rivaroxaban     TAKE these medications   albuterol 108 (90 Base) MCG/ACT inhaler Commonly known as:  PROVENTIL HFA;VENTOLIN HFA Inhale 2 puffs into the lungs every 6 (six) hours as needed for wheezing or shortness of  breath.   atenolol 50 MG tablet Commonly known as:  TENORMIN Take 50 mg by mouth daily.   benzonatate 100 MG capsule Commonly known as:  TESSALON Take 1 capsule (100 mg total) by mouth 3 (three) times daily as needed for cough.   bisacodyl 5 MG EC tablet Commonly known as:  DULCOLAX Take 1 tablet (5 mg total) by mouth daily as needed for moderate constipation.   cetirizine 10 MG tablet Commonly known as:  ZYRTEC Take 10 mg by mouth daily as needed for allergies.   ciprofloxacin 500 MG tablet Commonly known as:  CIPRO Take 1 tablet (500 mg total) by mouth 2 (two) times daily.   dexamethasone 4 MG tablet Commonly known as:  DECADRON Decadron 4 mg BID followed by Decadron 2 mg BID followed by  Decadron 2 mg daily. What changed:  additional instructions   HYDROmorphone 2 MG tablet Commonly known as:  DILAUDID Take 1-2 tablets (2-4 mg total) by mouth every 4 (four) hours as needed for up to 3 days for severe pain.   lactose free nutrition Liqd Take 237 mLs by mouth daily as needed (If patient requests for nutrition).   lactulose 10 GM/15ML solution Commonly known as:  CHRONULAC Take 45 mLs (30 g total) by mouth daily as needed for mild constipation.   lidocaine-prilocaine cream Commonly known as:  EMLA Apply 1 application topically as needed. Apply one hour prior to port access and cover with plastic wrap   LORazepam 0.5 MG tablet Commonly known as:  ATIVAN Take 1 tablet (0.5 mg total) by mouth every 8 (eight) hours. What changed:    how much to take  when to take this  reasons to take this   methocarbamol 500 MG tablet Commonly known as:  ROBAXIN Take 1 tablet (500 mg total) by mouth every 6 (six) hours as needed for muscle spasms.   multivitamin Tabs tablet Take 1 tablet by mouth daily.   ondansetron 8 MG tablet Commonly known as:  ZOFRAN TAKE (1) TABLET BY MOUTH EVERY SIX HOURS AS NEEDED FOR NAUSEA. What changed:  See the new instructions.    pantoprazole 40 MG tablet Commonly known as:  PROTONIX Take 1 tablet (40 mg total) by mouth daily.   polyethylene glycol packet Commonly known as:  MIRALAX / GLYCOLAX Take 17 g by mouth daily.   senna-docusate 8.6-50 MG tablet Commonly known as:  Senokot-S Take 1 tablet by mouth 2 (two) times daily.   thiamine 100 MG tablet Take 1 tablet (100 mg total) by mouth daily.      [facility] Follow up.   Why:  For hospital bed Contact information: [address] [place] [address] [phone]        [facility] Follow [facility].   Specialty:  [place] Why:  For home health physical therapy Contact information: [address] [place] [address] [phone]        [name], MD. Schedule an appointment as soon as possible for a visit in  1 week(s).   Specialty:  Internal Medicine Contact information: [address] [place] [address] [phone]          Allergies  Allergen Reactions  . Penicillins Other (See Comments)    Childhood allergy reaction unknown  Has patient had a PCN reaction causing immediate rash, facial/tongue/throat swelling, SOB or lightheadedness with hypotension: Unknown Has patient had a PCN reaction causing severe rash involving mucus membranes or skin necrosis: Unknown Has patient had a PCN reaction that required hospitalization: Unknown Has patient had a PCN reaction occurring within the last 10 years: Unknown If all of the above answers are "NO", then may proceed with Cephalosporin use.     Consultations:  Oncology  PCCM.      Procedures/Studies: Dg Chest 2 View  Result Date: 10/13/2017 CLINICAL DATA:  Fever, lymphoma, shortness of breath EXAM: CHEST - 2 VIEW COMPARISON:  09/24/2017 FINDINGS: Right Port-A-Cath remains in place, unchanged. Innumerable bilateral pulmonary nodules are again noted, stable. Small left  effusion, stable. Heart is normal size. No acute bony abnormality. IMPRESSION: Stable innumerable bilateral pulmonary nodules. Small left effusion. No change since prior study. Electronically Signed   By: Rolm Baptise M.D.   On: 10/13/2017 16:56   Ct Angio Chest Pe W Or Wo Contrast  Result Date: 10/16/2017 CLINICAL DATA:  Tachypnea.  Difficulty breathing.  Tachycardia. EXAM: CT ANGIOGRAPHY CHEST WITH CONTRAST TECHNIQUE: Multidetector CT imaging of the chest was performed using the standard protocol during bolus administration of intravenous contrast. Multiplanar CT image reconstructions and MIPs were obtained to evaluate the vascular anatomy. CONTRAST:  54mL ISOVUE-370 IOPAMIDOL (ISOVUE-370) INJECTION 76% COMPARISON:  09/20/2017 FINDINGS: Cardiovascular: Motion artifact degrades the exam. There are no obvious filling defects in the pulmonary arterial tree to suggest acute pulmonary thromboembolism. Hilar adenopathy does cause mass effect upon the pulmonary artery branches as they pass through the hilar regions. No obvious aortic aneurysm or dissection. No significant coronary artery calcification. Right jugular Port-A-Cath tip at the cavoatrial junction. Mediastinum/Nodes: Mediastinal and hilar adenopathy is worse. 2.0 cm precarinal lymph node on image 56. 1.6 cm prevascular node on image 55. 2.9 cm right hilar nodal mass on image 65. 1.3 cm left hilar node on image 68. Right axillary adenopathy is stable. 4.7 x 2.6 cm right axillary mass previously measured 4.8 x 2.4 cm. This is stable. Physiologic pericardial fluid. Visualized thyroid and esophagus are within normal limits. Lungs/Pleura: Innumerable masses throughout both lungs are not significantly changed. Associated ground-glass opacities and interlobular septal thickening have developed throughout both lungs with a central distribution. Left lower lobe consolidation is worse. Bilateral pleural effusions are increasing, small in left and tiny on the right.  No pneumothorax. Upper Abdomen: Stable liver cysts. Musculoskeletal: No vertebral compression deformity. Motion artifact in the mid sternum. Review of the MIP images confirms the above findings. IMPRESSION: No evidence of acute pulmonary thromboembolism. Compared to the prior study, mediastinal adenopathy, hilar adenopathy and bilateral central ground-glass opacities of developed. Pulmonary masses and right axillary adenopathy is stable. These findings suggest volume overload with engorgement of mediastinal nodes favored over worsening malignancy. Increasing consolidation in the left lower lobe. This may represent worsening pneumonia or tumor infiltration. Electronically Signed   By: Marybelle Killings M.D.   On: 10/16/2017 08:51   Ct Abdomen Pelvis W Contrast  Result Date: 10/31/2017 CLINICAL DATA:  Severe low back pain.  History of lymphoma. EXAM: CT ABDOMEN AND PELVIS WITH CONTRAST TECHNIQUE: Multidetector CT imaging of the abdomen and pelvis  was performed using the standard protocol following bolus administration of intravenous contrast. CONTRAST:  141mL OMNIPAQUE IOHEXOL 300 MG/ML SOLN, 5mL ISOVUE-300 IOPAMIDOL (ISOVUE-300) INJECTION 61% COMPARISON:  Chest CT 10/16/2017 FINDINGS: Lower chest: Innumerable pulmonary lesions as demonstrated previously. These are stable to slightly enlarged when compared to the prior chest CT. However, there was significant breathing motion artifact on the prior study making it difficult to measure accurately. The associated airspace process seen on the prior CT has improved. There is a persistent moderate-sized left pleural effusion with significant overlying atelectasis. Hepatobiliary: Stable hepatic cysts and hemangiomas. No new or worrisome lesions. The gallbladder is normal. No common bile duct dilatation. Pancreas: No mass, inflammation or ductal dilatation. Spleen: Normal size. Stable small cystic area in the upper anterior aspect of the spleen. Adrenals/Urinary Tract: The  adrenal glands and kidneys are unremarkable and stable. Thick-walled bladder with enhancement of the mucosa suggesting cystitis. Stomach/Bowel: The stomach, duodenum, small bowel and colon are grossly normal. No acute inflammatory changes, mass lesions or obstructive findings. Vascular/Lymphatic: The aorta and branch vessels are patent. The major venous structures are patent. Extensive mesenteric and retroperitoneal lymphadenopathy. Significant interval enlargement of a hepatic duodenum ligament lymph node on image number 28. It measures 2.8 x 2.7 cm and on the prior chest CT measured 15.5 x 11.5 mm. Celiac axis lymph node on image number 22 measures 24 x 18.5 mm and previously measured 14 x 5 mm. Several mesenteric lymph nodes are also larger when compared to the prior chest CT and PET-CT. However, several of the retroperitoneal lymph nodes are definitely smaller and there is interstitial changes around the nodes suggesting treatment changes. Several of these nodes demonstrate enhancement. The inter aortocaval lymph node on the prior study measured 23 x 18.5 mm and is no longer measurable. Reproductive: The prostate gland and seminal vesicles are unremarkable. Other: There is a small amount of free pelvic fluid. No pelvic abscess. No free air. No inguinal mass or adenopathy. Musculoskeletal: No obvious bone lesions. No pathologic fracture. No large disc protrusion or soft tissue mass in the spinal canal. IMPRESSION: 1. Improved aeration of the lung bases but persistent and slightly progressive pulmonary nodules. 2. Progressive periportal and mesenteric adenopathy but definite significant regression of retroperitoneal adenopathy seen on the prior PET-CT. 3. Thick-walled bladder with mucosal enhancement suspicious for cystitis. Electronically Signed   By: Marijo Sanes M.D.   On: 10/31/2017 17:14   Dg Chest Port 1 View  Result Date: 10/29/2017 CLINICAL DATA:  Respiratory failure EXAM: PORTABLE CHEST 1 VIEW  COMPARISON:  Portable chest x-ray of October 22, 2017 FINDINGS: There are persistent patchy airspace opacities bilaterally with near confluence in the left lower lung. Slight clearing in the left upper lobe is present. Airspace opacities on the right are fairly stable. There is a small left pleural effusion. The cardiac silhouette is top-normal in size. The pulmonary vascularity is not clearly engorged. The power port catheter tip projects over the distal third of the SVC. IMPRESSION: Persistent bilateral alveolar pneumonia possibly slightly improved since the previous study. Persistent small left pleural effusion. No overt CHF. Electronically Signed   By: David  Martinique M.D.   On: 10/29/2017 09:07   Dg Chest Port 1 View  Result Date: 10/22/2017 CLINICAL DATA:  Acute respiratory failure EXAM: PORTABLE CHEST 1 VIEW COMPARISON:  Chest x-ray of October 20, 2017 FINDINGS: The lungs are adequately inflated. Confluent airspace opacities have decreased in conspicuity but not completely cleared. The left hemidiaphragm remains obscured. There is  a small left pleural effusion. The heart is not enlarged. The pulmonary vascularity is indistinct. The power port catheter tip projects over the junction of the middle and distal thirds of the SVC. There is multilevel right lateral endplate spurring of the thoracic spine. IMPRESSION: Mild interval decrease in the conspicuity of bilateral airspace opacities consistent with improving bilateral pneumonia. Electronically Signed   By: David  Martinique M.D.   On: 10/22/2017 09:29   Dg Chest Port 1 View  Result Date: 10/20/2017 CLINICAL DATA:  Respiratory failure EXAM: PORTABLE CHEST 1 VIEW COMPARISON:  10/18/2017 FINDINGS: Port in the anterior chest wall with tip in distal SVC. Normal cardiac silhouette. Bilateral pulmonary nodules. Diffuse airspace disease. Findings correspond to CT 10/16/2017. Small to moderate LEFT effusion. IMPRESSION: 1. No interval change. 2. Bilateral pulmonary  nodules on the background of diffuse airspace disease. Electronically Signed   By: Suzy Bouchard M.D.   On: 10/20/2017 07:47   Dg Chest Port 1 View  Result Date: 10/18/2017 CLINICAL DATA:  Pneumonia EXAM: PORTABLE CHEST 1 VIEW COMPARISON:  10/16/2017 FINDINGS: Right Port-A-Cath remains in place, unchanged. Diffuse bilateral nodular opacities and airspace disease again noted, unchanged. Heart is normal size. Small left effusion suspected. No acute bony abnormality. IMPRESSION: Diffuse bilateral nodular opacities and airspace disease with small left effusion. No change. Electronically Signed   By: Rolm Baptise M.D.   On: 10/18/2017 10:29   Dg Chest Port 1 View  Result Date: 10/16/2017 CLINICAL DATA:  Cough and weakness EXAM: PORTABLE CHEST 1 VIEW COMPARISON:  10/16/2017 FINDINGS: Cardiac shadow is mildly enlarged but accentuated by the portable technique. Right chest wall port is again seen. Combination of nodular densities and infiltrative densities are again identified bilaterally stable from the prior exam. Left pleural effusion is again seen. No acute bony abnormality is noted. IMPRESSION: Stable appearance of the chest from the previous day. Electronically Signed   By: Inez Catalina M.D.   On: 10/16/2017 15:58   Dg Chest Port 1 View  Result Date: 10/16/2017 CLINICAL DATA:  Fever and tachypnea.  History of lymphoma EXAM: PORTABLE CHEST 1 VIEW COMPARISON:  October 15, 2017 chest radiograph; chest CT September 20, 2017 FINDINGS: Port-A-Cath tip is in the superior vena cava. No pneumothorax. There is widespread airspace consolidation bilaterally, slightly progressed from 1 day prior. Underlying pulmonary nodular lesions remain throughout the lungs. There is a small left pleural effusion. Heart size and pulmonary vascularity are normal. Adenopathy appreciable on recent CT is not well seen by radiography. There is degenerative change in the thoracic spine. IMPRESSION: Widespread airspace opacity, slightly  increased, most likely due to widespread pneumonia. Pulmonary nodular lesions throughout the lungs are evident, not felt to be changed. There is a small left pleural effusion. Stable cardiac silhouette. Electronically Signed   By: Lowella Grip III M.D.   On: 10/16/2017 07:10   Dg Chest Port 1 View  Result Date: 10/15/2017 CLINICAL DATA:  Shortness of Breath EXAM: PORTABLE CHEST 1 VIEW COMPARISON:  10/13/2017 FINDINGS: Right Port-A-Cath remains in place, unchanged. Innumerable bilateral pulmonary nodules again noted, stable. Heart is normal size. Small left effusion, stable. No acute bony abnormality. IMPRESSION: Innumerable bilateral pulmonary nodules with small left effusion. No change since prior study. Electronically Signed   By: Rolm Baptise M.D.   On: 10/15/2017 14:02   Dg Abd 2 Views  Result Date: 10/30/2017 CLINICAL DATA:  GE reflux symptoms. Constipation. Shortness of breath on exertion. Current history of pulmonary non-Hodgkin's lymphoma for which the patient  is undergoing chemotherapy. Severe thrombocytopenia and neutropenia. EXAM: ABDOMEN - 2 VIEW COMPARISON:  PET-CT 03/15/2017, 05/08/2016 and earlier. Chest x-rays yesterday and earlier including CT chest 10/16/2017. FINDINGS: Bowel gas pattern unremarkable without evidence of obstruction or significant ileus. No evidence of free air or significant air-fluid levels on the erect image. Expected stool burden in the colon. Air-fluid levels in the colon indicates liquid stool. Phleboliths in both sides of the low pelvis. No visible opaque urinary tract calculi. Nodular and airspace opacities at the lung bases and small LEFT pleural effusion noted on the ERECT image as noted on yesterday's chest x-ray. IMPRESSION: 1. No acute abdominal abnormality. 2. Nodular and airspace opacities in the lung bases and small LEFT pleural effusion as noted on yesterday's chest x-ray. Electronically Signed   By: Evangeline Dakin M.D.   On: 10/30/2017 16:19        Subjective: No chest pain or sob.  Abel to tolerate on 4lit of [place] oxygen.    Discharge Exam: Vitals:   11/02/17 1247 11/02/17 1542  BP: 101/71 (!) 93/50  Pulse: (!) 110 85  Resp: (!) 24 20  Temp: 99.8 F (37.7 C) (!) 97.5 F (36.4 C)  SpO2: (!) 87% 90%   Vitals:   11/02/17 1128 11/02/17 1207 11/02/17 1247 11/02/17 1542  BP: 107/73 111/76 101/71 (!) 93/50  Pulse: (!) 120 (!) 116 (!) 110 85  Resp: (!) 32 (!) 24 (!) 24 20  Temp: 99.7 F (37.6 C) 99.5 F (37.5 C) 99.8 F (37.7 C) (!) 97.5 F (36.4 C)  TempSrc: Oral Oral Oral Oral  SpO2: (!) 88% (!) 88% (!) 87% 90%  Weight:      Height:        General: Pt is alert, awake, not in acute distress Cardiovascular: RRR, S1/S2 +, no rubs, no gallops Respiratory: CTA bilaterally, no wheezing, no rhonchi Abdominal: Soft, NT, ND, bowel sounds + Extremities: no edema, no cyanosis    The results of significant diagnostics from this hospitalization (including imaging, microbiology, ancillary and laboratory) are listed below for reference.     Microbiology: Recent Results (from the past 240 hour(s))  Urine culture     Status: None   Collection Time: 10/30/17  3:55 PM  Result Value Ref Range Status   Specimen Description   Final    URINE, CLEAN CATCH Performed at [facility], [place] [address], [place], [place] [address]    Special Requests   Final    NONE Performed at [facility], [place] [address], [place], [place] [address]    Culture   Final    NO GROWTH Performed at [facility], [place] [address] [place], [place] [address]    Report Status 11/01/2017 FINAL  Final     Labs: BNP (last 3 results) Recent Labs    10/15/17 1421  BNP 82.4   Basic Metabolic Panel: Recent Labs  Lab 10/29/17 0445 10/30/17 0619 10/31/17 0845 11/01/17 0435  NA 142 141 141 136  K 4.0 4.1 4.0 3.9  CL 100 100 99 98  CO2 31 28 30 26   GLUCOSE 164* 146* 164* 212*  BUN 45* 46* 43* 42*   CREATININE 0.92 1.15 1.25* 1.10  CALCIUM 8.7* 8.9 8.7* 7.7*   Liver Function Tests: Recent Labs  Lab 10/29/17 0445 10/30/17 1843 11/01/17 0435  AST 27 27 21   ALT 30 26 21   ALKPHOS 52 46 34*  BILITOT 0.9 1.1 0.8  PROT 5.3* 5.3* 4.7*  ALBUMIN 2.4* 2.5* 2.0*   No results for input(s): LIPASE, AMYLASE in the last 168 hours. No results for input(s): AMMONIA in the last 168 hours. CBC: Recent Labs  Lab 10/29/17 0445 10/30/17 0619 10/30/17 1843 10/31/17 0629 11/01/17 0435 11/02/17 0528 11/02/17 1714  WBC 3.2* 0.6*  --  0.2* 0.5* 4.7 7.5  NEUTROABS 3.1 0.5*  --   --  0.3* 3.3  --   HGB 9.8* 9.3*  --  8.1* 7.8* 8.9* 7.8*  HCT 28.6* 28.1*  --  24.6* 23.9* 26.8* 23.4*  MCV 92.0 93.4  --  94.6 95.6 94.0 94.0  PLT 15* 6* 32* 19* 10* 6* 33*   Cardiac Enzymes: No results for input(s): CKTOTAL, CKMB, CKMBINDEX, TROPONINI in the last 168 hours. BNP: Invalid input(s): POCBNP CBG: Recent Labs  Lab 10/28/17 1248 10/28/17 1717 10/28/17 1937 10/29/17 0311 10/30/17 1800  GLUCAP 260* 237* 178* 166* 168*   D-Dimer No results for input(s): DDIMER in the last 72 hours. Hgb A1c No results for input(s): HGBA1C in the last 72 hours. Lipid Profile No results for input(s): CHOL, HDL, LDLCALC, TRIG, CHOLHDL, LDLDIRECT in the last 72 hours. Thyroid function studies No results for input(s): TSH, T4TOTAL, T3FREE, THYROIDAB in the last 72 hours.  Invalid input(s): FREET3 Anemia work up No results for input(s): VITAMINB12, FOLATE, FERRITIN, TIBC, IRON, RETICCTPCT in the last 72 hours. Urinalysis    Component Value Date/Time   COLORURINE YELLOW 10/30/2017 [place] 10/30/2017 1555   LABSPEC 1.015 10/30/2017 1555   LABSPEC 1.025 10/23/2010 0822   PHURINE 5.0 10/30/2017 1555   GLUCOSEU >=500 (A) 10/30/2017 1555   HGBUR LARGE (A) 10/30/2017 1555   BILIRUBINUR NEGATIVE 10/30/2017 1555   BILIRUBINUR Negative 10/23/2010 0822   KETONESUR NEGATIVE 10/30/2017 1555    PROTEINUR 30 (A) 10/30/2017 1555   UROBILINOGEN 0.2 11/23/2010 1348   NITRITE NEGATIVE 10/30/2017 1555   LEUKOCYTESUR NEGATIVE 10/30/2017 1555   LEUKOCYTESUR Negative 10/23/2010 0822   Sepsis Labs Invalid input(s): PROCALCITONIN,  WBC,  LACTICIDVEN Microbiology Recent Results (from the past 240 hour(s))  Urine culture     Status: None   Collection Time: 10/30/17  3:55 PM  Result Value Ref Range Status   Specimen Description   Final    URINE, CLEAN CATCH Performed at [facility], [place] [address], [place], [place] [address]    Special Requests   Final    NONE Performed at [facility], [place] [address], [place], [place] [address]    Culture   Final    NO GROWTH Performed at [facility], [place] [address], [place], [place] [address]    Report Status 11/01/2017 FINAL  Final     Time coordinating discharge: 32  minutes  SIGNED:   Hosie Poisson, MD  [facility] 11/04/2017, 9:07 AM Pager   If 7PM-7AM, please contact night-coverage [email or web address] Password [email or web address]

## 2017-11-05 ENCOUNTER — Other Ambulatory Visit: Payer: Self-pay | Admitting: Oncology

## 2017-11-05 ENCOUNTER — Other Ambulatory Visit: Payer: Self-pay | Admitting: Emergency Medicine

## 2017-11-05 DIAGNOSIS — C8590 Non-Hodgkin lymphoma, unspecified, unspecified site: Secondary | ICD-10-CM

## 2017-11-05 MED ORDER — DEXAMETHASONE 4 MG PO TABS: ORAL_TABLET | ORAL | 0 refills | Status: DC

## 2017-11-05 MED ORDER — HYDROMORPHONE HCL 2 MG PO TABS: 2.0000 mg | ORAL_TABLET | ORAL | 0 refills | Status: AC | PRN

## 2017-11-05 MED ORDER — DEXAMETHASONE 4 MG PO TABS: ORAL_TABLET | ORAL | 0 refills | Status: AC

## 2017-11-05 MED ORDER — DEXAMETHASONE 4 MG PO TABS: 8.0000 mg | ORAL_TABLET | Freq: Two times a day (BID) | ORAL | 0 refills | Status: AC

## 2017-11-05 MED ORDER — LORAZEPAM 0.5 MG PO TABS: 0.5000 mg | ORAL_TABLET | Freq: Three times a day (TID) | ORAL | 0 refills | Status: AC

## 2017-11-06 ENCOUNTER — Telehealth: Payer: Self-pay | Admitting: *Deleted

## 2017-11-07 NOTE — Progress Notes (Signed)
FMLA successfully faxed to [employer] at [phone]. Mailed copy to patient address on file. 

## 2017-11-22 NOTE — Telephone Encounter (Signed)
VM message received from Brandon Melnick, RN with [facility] reporting that patient passed away this morning @10 :27 am.  She states it was a peaceful death, family at bedside.

## 2017-11-22 DEATH — deceased

## 2020-01-15 IMAGING — CT CT ANGIO CHEST
1 of 6 series · 18 of 36 positions shown · IV contrast (APPLIED)
Comparison: 09/20/2017

CLINICAL DATA: Tachypnea.  Difficulty breathing.  Tachycardia.

EXAM:
CT ANGIOGRAPHY CHEST WITH CONTRAST
TECHNIQUE: Multidetector CT imaging of the chest was performed using the
standard protocol during bolus administration of intravenous
contrast. Multiplanar CT image reconstructions and MIPs were
obtained to evaluate the vascular anatomy.
CONTRAST:  63mL L98DXH-ON2 IOPAMIDOL (L98DXH-ON2) INJECTION 76%

[Series 6: thins · axial · 0.83mm/px · z∈[-300,+16]mm · 18 of 352 slices shown]
[im 18/352  lung]
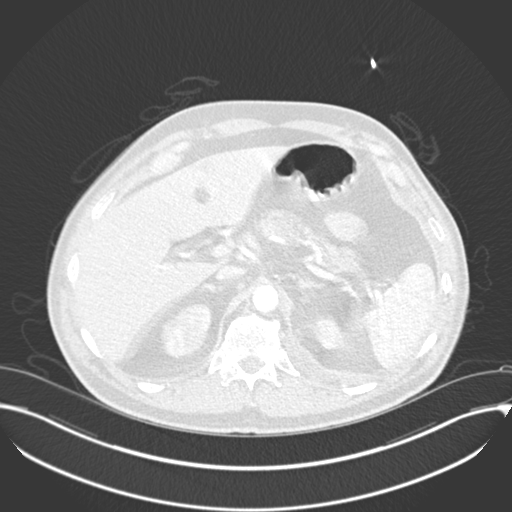
[im 36/352  mediastinal]
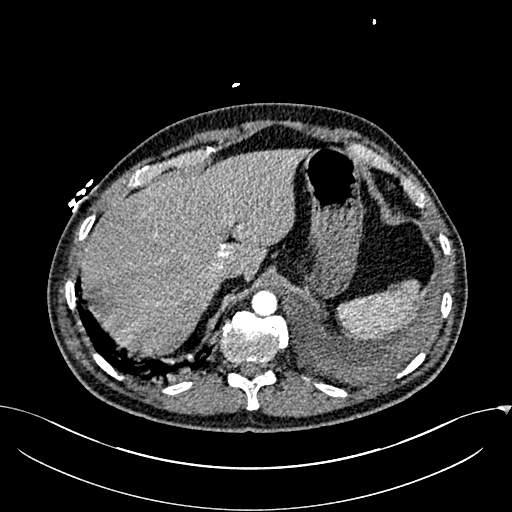
[im 53/352  lung]
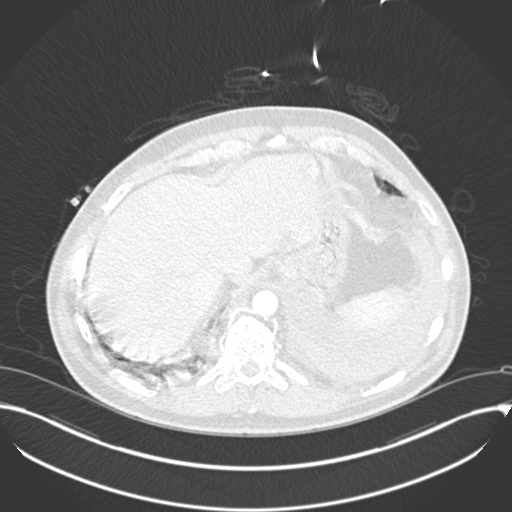
[im 71/352  mediastinal]
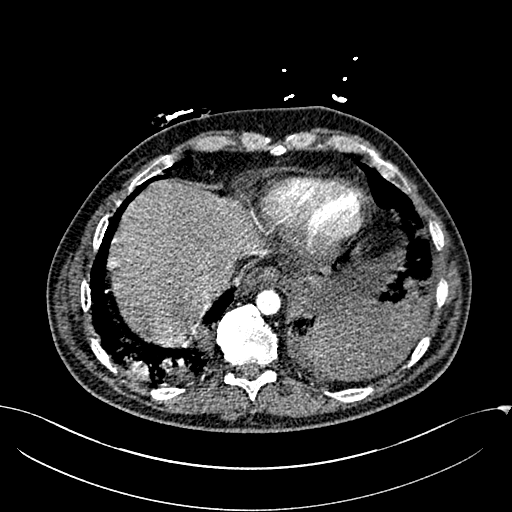
[im 88/352  lung]
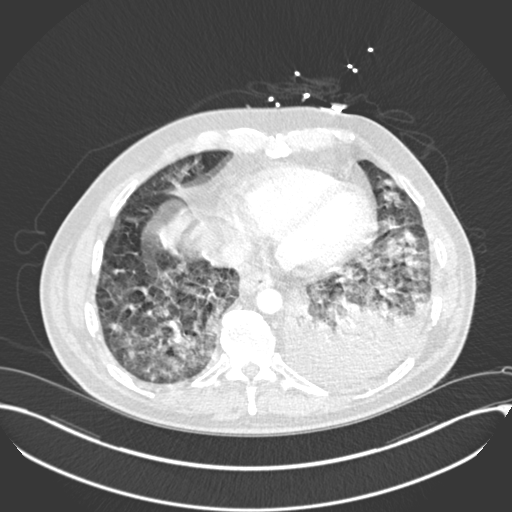
[im 106/352  mediastinal]
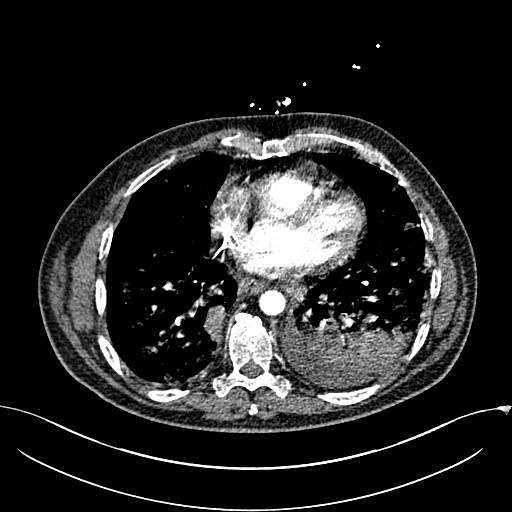
[im 123/352  lung]
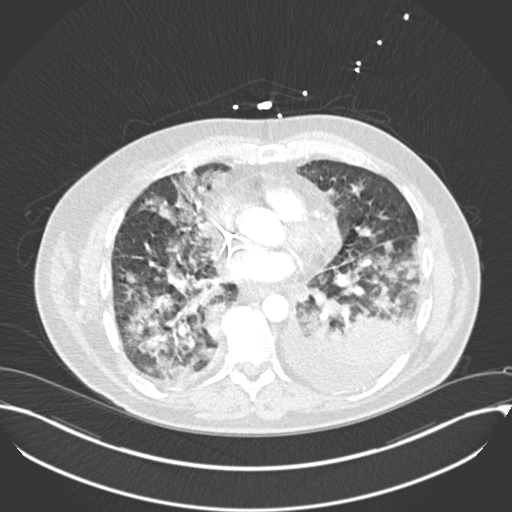
[im 141/352  mediastinal]
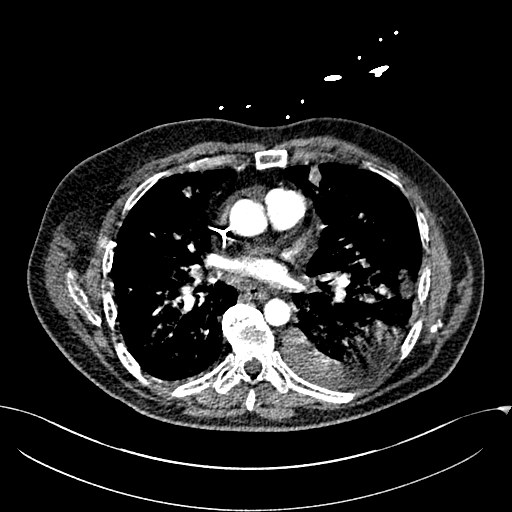
[im 158/352  lung]
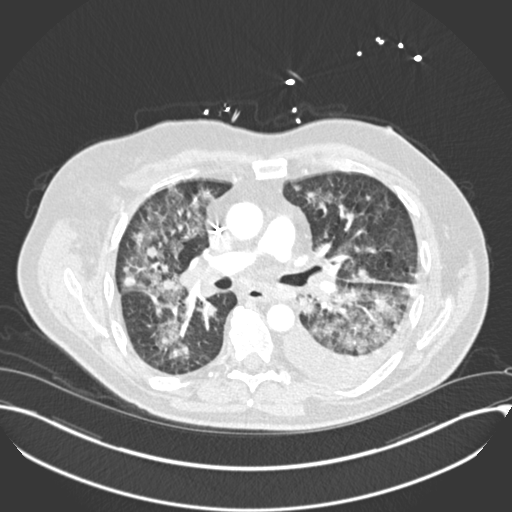
[im 194/352  mediastinal]
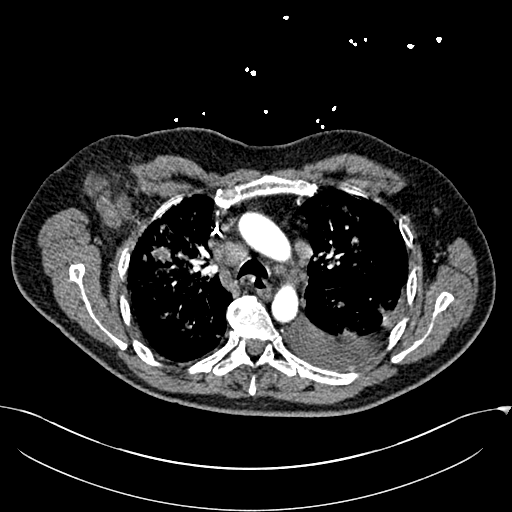
[im 211/352  lung]
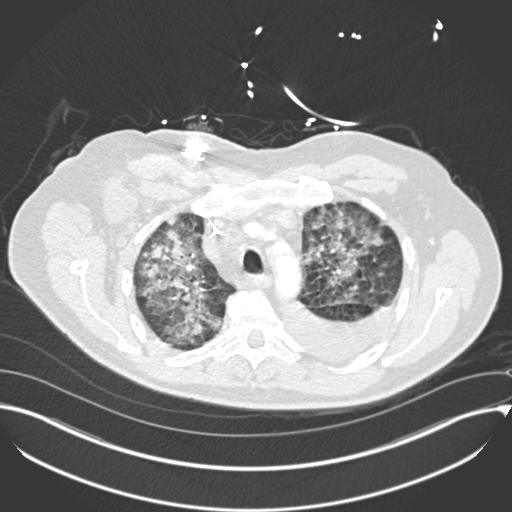
[im 229/352  mediastinal]
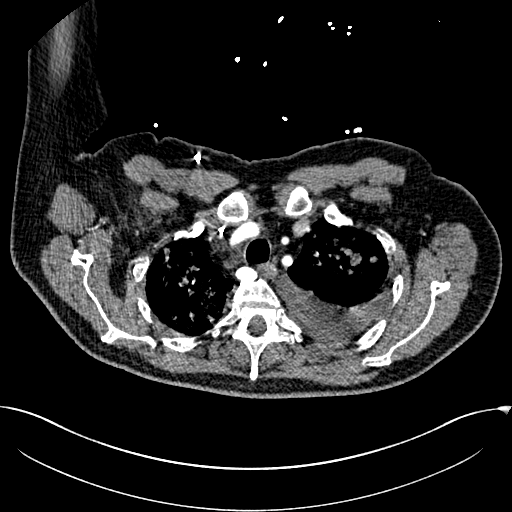
[im 246/352  lung]
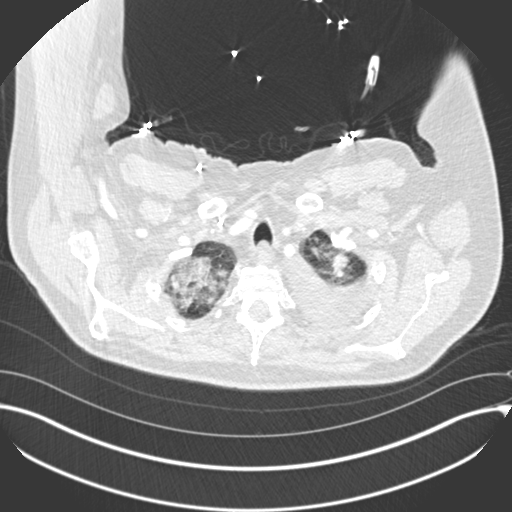
[im 264/352  mediastinal]
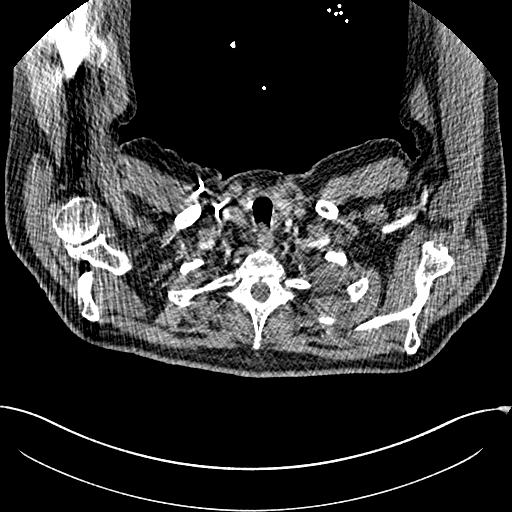
[im 281/352  lung]
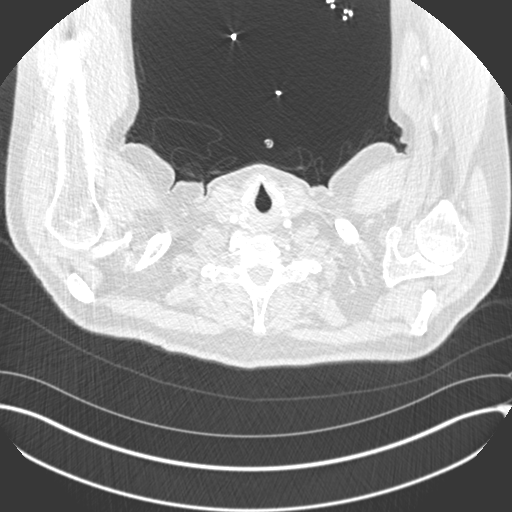
[im 299/352  mediastinal]
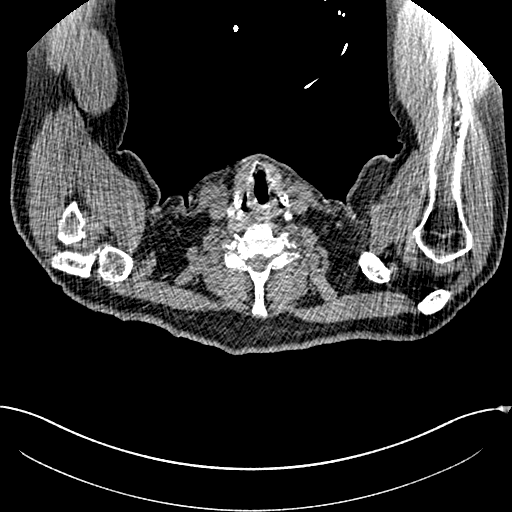
[im 316/352  lung]
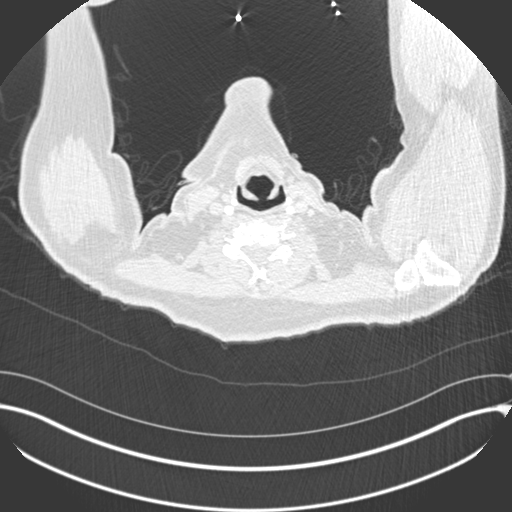
[im 334/352  mediastinal]
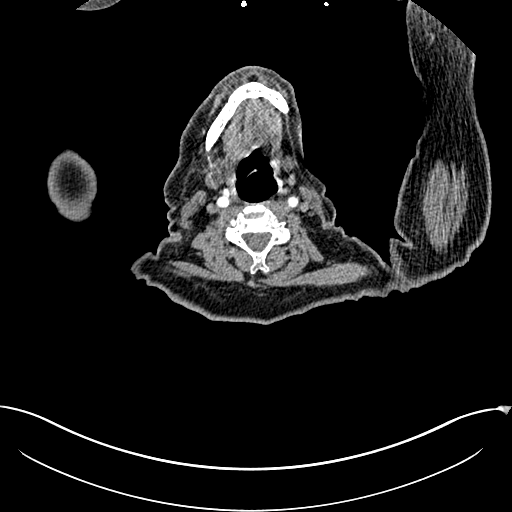

[18 of 36 positions shown; findings below may reference images not displayed]

FINDINGS: Cardiovascular: Motion artifact degrades the exam. There are no
obvious filling defects in the pulmonary arterial tree to suggest
acute pulmonary thromboembolism. Hilar adenopathy does cause mass
effect upon the pulmonary artery branches as they pass through the
hilar regions. No obvious aortic aneurysm or dissection. No
significant coronary artery calcification. Right jugular Port-A-Cath
tip at the cavoatrial junction.

Mediastinum/Nodes: Mediastinal and hilar adenopathy is worse. 2.0 cm
precarinal lymph node on image 56. 1.6 cm prevascular node on image
55. 2.9 cm right hilar nodal mass on image 65. 1.3 cm left hilar
node on image 68.

Right axillary adenopathy is stable. 4.7 x 2.6 cm right axillary
mass previously measured 4.8 x 2.4 cm. This is stable.

Physiologic pericardial fluid. Visualized thyroid and esophagus are
within normal limits.

Lungs/Pleura: Innumerable masses throughout both lungs are not
significantly changed. Associated ground-glass opacities and
interlobular septal thickening have developed throughout both lungs
with a central distribution. Left lower lobe consolidation is worse.
Bilateral pleural effusions are increasing, small in left and tiny
on the right. No pneumothorax.

Upper Abdomen: Stable liver cysts.

Musculoskeletal: No vertebral compression deformity. Motion artifact
in the mid sternum.

Review of the MIP images confirms the above findings.
IMPRESSION: No evidence of acute pulmonary thromboembolism.

Compared to the prior study, mediastinal adenopathy, hilar
adenopathy and bilateral central ground-glass opacities of
developed. Pulmonary masses and right axillary adenopathy is stable.
These findings suggest volume overload with engorgement of
mediastinal nodes favored over worsening malignancy.

Increasing consolidation in the left lower lobe. This may represent
worsening pneumonia or tumor infiltration.

## 2020-01-15 IMAGING — DX DG CHEST 1V PORT
1 series · 1 of 1 positions shown · non-contrast
Comparison: 10/16/2017

CLINICAL DATA: Cough and weakness

EXAM:
PORTABLE CHEST 1 VIEW

[chest ap]
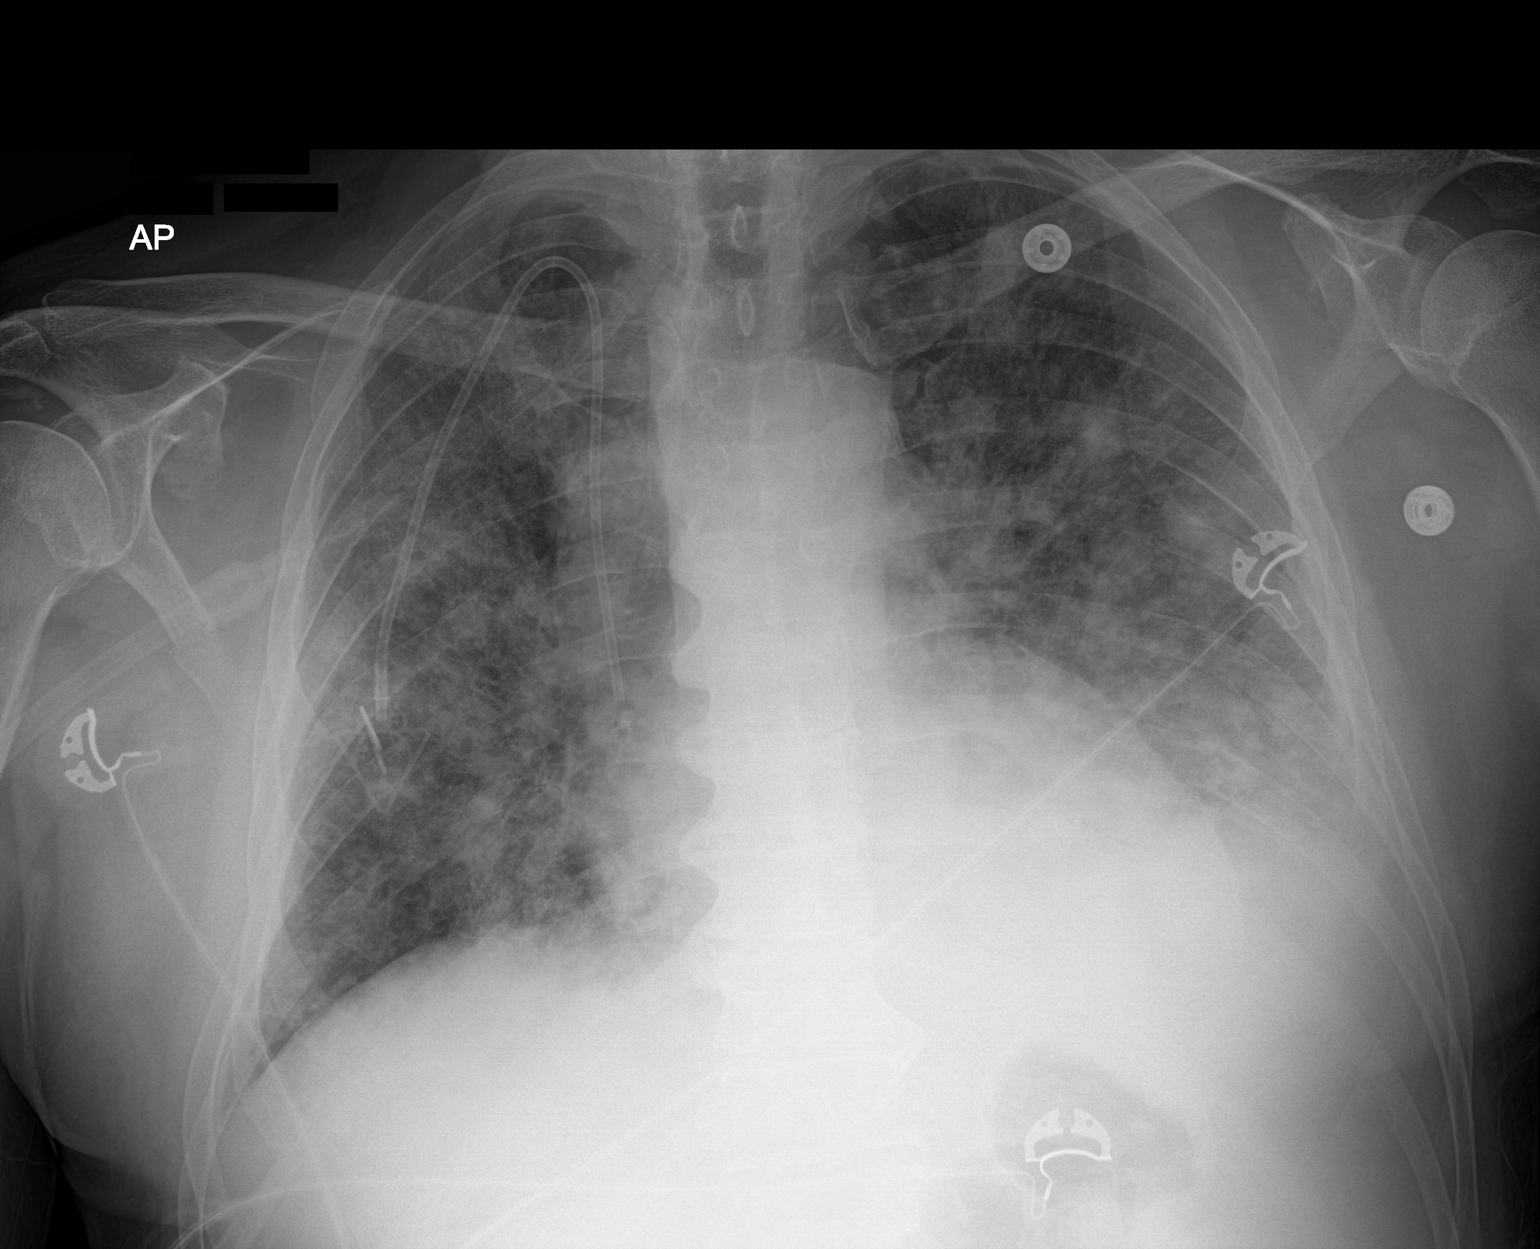

[1 of 1 positions shown; findings below may reference images not displayed]

FINDINGS: Cardiac shadow is mildly enlarged but accentuated by the portable
technique. Right chest wall port is again seen. Combination of
nodular densities and infiltrative densities are again identified
bilaterally stable from the prior exam. Left pleural effusion is
again seen. No acute bony abnormality is noted.
IMPRESSION: Stable appearance of the chest from the previous day.
